# Patient Record
Sex: Male | Born: 1940
Health system: Southern US, Community
[De-identification: ages and names within clinical notes are randomized; demographics above are authoritative.]

## PROBLEM LIST (undated history)

## (undated) DIAGNOSIS — IMO0002 Reserved for concepts with insufficient information to code with codable children: Secondary | ICD-10-CM

## (undated) DIAGNOSIS — M1990 Unspecified osteoarthritis, unspecified site: Secondary | ICD-10-CM

## (undated) DIAGNOSIS — Z83719 Family history of colon polyps, unspecified: Secondary | ICD-10-CM

## (undated) DIAGNOSIS — E7800 Pure hypercholesterolemia, unspecified: Secondary | ICD-10-CM

## (undated) DIAGNOSIS — H43812 Vitreous degeneration, left eye: Secondary | ICD-10-CM

## (undated) DIAGNOSIS — M549 Dorsalgia, unspecified: Secondary | ICD-10-CM

## (undated) DIAGNOSIS — K219 Gastro-esophageal reflux disease without esophagitis: Secondary | ICD-10-CM

## (undated) DIAGNOSIS — I10 Essential (primary) hypertension: Secondary | ICD-10-CM

## (undated) DIAGNOSIS — Z8371 Family history of colonic polyps: Secondary | ICD-10-CM

## (undated) DIAGNOSIS — N400 Enlarged prostate without lower urinary tract symptoms: Secondary | ICD-10-CM

## (undated) DIAGNOSIS — E785 Hyperlipidemia, unspecified: Secondary | ICD-10-CM

## (undated) DIAGNOSIS — Z923 Personal history of irradiation: Secondary | ICD-10-CM

## (undated) DIAGNOSIS — N281 Cyst of kidney, acquired: Secondary | ICD-10-CM

## (undated) DIAGNOSIS — C099 Malignant neoplasm of tonsil, unspecified: Secondary | ICD-10-CM

## (undated) DIAGNOSIS — K649 Unspecified hemorrhoids: Secondary | ICD-10-CM

## (undated) DIAGNOSIS — IMO0001 Reserved for inherently not codable concepts without codable children: Secondary | ICD-10-CM

## (undated) HISTORY — DX: Gastro-esophageal reflux disease without esophagitis: K21.9

## (undated) HISTORY — DX: Hyperlipidemia, unspecified: E78.5

## (undated) HISTORY — DX: Family history of colon polyps, unspecified: Z83.719

## (undated) HISTORY — PX: CARDIAC CATHETERIZATION: SHX172

## (undated) HISTORY — DX: Vitreous degeneration, left eye: H43.812

## (undated) HISTORY — DX: Unspecified hemorrhoids: K64.9

## (undated) HISTORY — DX: Unspecified osteoarthritis, unspecified site: M19.90

## (undated) HISTORY — DX: Pure hypercholesterolemia, unspecified: E78.00

## (undated) HISTORY — DX: Family history of colonic polyps: Z83.71

## (undated) HISTORY — DX: Essential (primary) hypertension: I10

## (undated) HISTORY — DX: Benign prostatic hyperplasia without lower urinary tract symptoms: N40.0

## (undated) HISTORY — DX: Dorsalgia, unspecified: M54.9

## (undated) HISTORY — DX: Malignant neoplasm of tonsil, unspecified: C09.9

## (undated) HISTORY — DX: Cyst of kidney, acquired: N28.1

---

## 1973-03-11 HISTORY — PX: VASECTOMY: SHX75

## 1999-08-30 ENCOUNTER — Ambulatory Visit (HOSPITAL_COMMUNITY): Admission: AD | Admit: 1999-08-30 | Discharge: 1999-08-30 | Payer: Self-pay | Admitting: Interventional Cardiology

## 2000-01-21 ENCOUNTER — Ambulatory Visit (HOSPITAL_COMMUNITY): Admission: RE | Admit: 2000-01-21 | Discharge: 2000-01-21 | Payer: Self-pay | Admitting: Gastroenterology

## 2001-07-30 ENCOUNTER — Ambulatory Visit (HOSPITAL_COMMUNITY): Admission: RE | Admit: 2001-07-30 | Discharge: 2001-07-30 | Payer: Self-pay | Admitting: Gastroenterology

## 2006-02-11 ENCOUNTER — Encounter: Admission: RE | Admit: 2006-02-11 | Discharge: 2006-02-11 | Payer: Self-pay | Admitting: Internal Medicine

## 2011-12-04 DIAGNOSIS — H43399 Other vitreous opacities, unspecified eye: Secondary | ICD-10-CM | POA: Diagnosis not present

## 2011-12-04 DIAGNOSIS — H43819 Vitreous degeneration, unspecified eye: Secondary | ICD-10-CM | POA: Diagnosis not present

## 2011-12-04 DIAGNOSIS — H35379 Puckering of macula, unspecified eye: Secondary | ICD-10-CM | POA: Diagnosis not present

## 2011-12-04 DIAGNOSIS — H2510 Age-related nuclear cataract, unspecified eye: Secondary | ICD-10-CM | POA: Diagnosis not present

## 2011-12-04 DIAGNOSIS — H04129 Dry eye syndrome of unspecified lacrimal gland: Secondary | ICD-10-CM | POA: Diagnosis not present

## 2011-12-10 DIAGNOSIS — R109 Unspecified abdominal pain: Secondary | ICD-10-CM | POA: Diagnosis not present

## 2011-12-10 DIAGNOSIS — Z23 Encounter for immunization: Secondary | ICD-10-CM | POA: Diagnosis not present

## 2012-07-17 DIAGNOSIS — R0789 Other chest pain: Secondary | ICD-10-CM | POA: Diagnosis not present

## 2012-12-07 DIAGNOSIS — H43399 Other vitreous opacities, unspecified eye: Secondary | ICD-10-CM | POA: Diagnosis not present

## 2012-12-07 DIAGNOSIS — H538 Other visual disturbances: Secondary | ICD-10-CM | POA: Diagnosis not present

## 2012-12-07 DIAGNOSIS — H04129 Dry eye syndrome of unspecified lacrimal gland: Secondary | ICD-10-CM | POA: Diagnosis not present

## 2012-12-07 DIAGNOSIS — H31019 Macula scars of posterior pole (postinflammatory) (post-traumatic), unspecified eye: Secondary | ICD-10-CM | POA: Diagnosis not present

## 2012-12-07 DIAGNOSIS — H35379 Puckering of macula, unspecified eye: Secondary | ICD-10-CM | POA: Diagnosis not present

## 2012-12-07 DIAGNOSIS — H35369 Drusen (degenerative) of macula, unspecified eye: Secondary | ICD-10-CM | POA: Diagnosis not present

## 2012-12-07 DIAGNOSIS — H43819 Vitreous degeneration, unspecified eye: Secondary | ICD-10-CM | POA: Diagnosis not present

## 2012-12-07 DIAGNOSIS — H2510 Age-related nuclear cataract, unspecified eye: Secondary | ICD-10-CM | POA: Diagnosis not present

## 2014-02-21 DIAGNOSIS — R000 Tachycardia, unspecified: Secondary | ICD-10-CM | POA: Diagnosis not present

## 2014-02-21 DIAGNOSIS — K088 Other specified disorders of teeth and supporting structures: Secondary | ICD-10-CM | POA: Diagnosis not present

## 2014-02-21 DIAGNOSIS — R9431 Abnormal electrocardiogram [ECG] [EKG]: Secondary | ICD-10-CM | POA: Diagnosis not present

## 2014-02-21 DIAGNOSIS — R51 Headache: Secondary | ICD-10-CM | POA: Diagnosis not present

## 2014-03-01 DIAGNOSIS — R51 Headache: Secondary | ICD-10-CM | POA: Diagnosis not present

## 2014-03-01 DIAGNOSIS — R9431 Abnormal electrocardiogram [ECG] [EKG]: Secondary | ICD-10-CM | POA: Diagnosis not present

## 2014-03-01 DIAGNOSIS — R000 Tachycardia, unspecified: Secondary | ICD-10-CM | POA: Diagnosis not present

## 2014-03-01 DIAGNOSIS — R739 Hyperglycemia, unspecified: Secondary | ICD-10-CM | POA: Diagnosis not present

## 2014-03-01 DIAGNOSIS — K088 Other specified disorders of teeth and supporting structures: Secondary | ICD-10-CM | POA: Diagnosis not present

## 2014-04-22 ENCOUNTER — Encounter: Payer: Self-pay | Admitting: Interventional Cardiology

## 2014-04-22 ENCOUNTER — Ambulatory Visit (INDEPENDENT_AMBULATORY_CARE_PROVIDER_SITE_OTHER): Payer: Medicare Other | Admitting: Interventional Cardiology

## 2014-04-22 VITALS — BP 124/80 | HR 73 | Ht 72.0 in | Wt 226.8 lb

## 2014-04-22 DIAGNOSIS — I10 Essential (primary) hypertension: Secondary | ICD-10-CM | POA: Diagnosis not present

## 2014-04-22 DIAGNOSIS — E785 Hyperlipidemia, unspecified: Secondary | ICD-10-CM | POA: Diagnosis not present

## 2014-04-22 DIAGNOSIS — R9431 Abnormal electrocardiogram [ECG] [EKG]: Secondary | ICD-10-CM | POA: Diagnosis not present

## 2014-04-22 NOTE — Patient Instructions (Signed)
Follow up appointment will be determined based on results of stress test  Your physician has requested that you have an exercise stress myoview. For further information please visit [email or web address]. Please follow instruction sheet, as given.

## 2014-04-22 NOTE — Progress Notes (Signed)
Patient ID: Randy Hanson, male   DOB: 1940/11/21, 74 y.o.   MRN: [id number]    Cardiology Office Note   Date:  04/22/2014   ID:  Randy Hanson, DOB 10/30/1940, MRN [id number]  PCP:  [name], MD  Cardiologist:   Lesleigh Noe, MD   Chief Complaint  Patient presents with  . Tachycardia      History of Present Illness: Randy Hanson is a 74 y.o. male who presents for  Evaluation of an abnormal EKG. The patient states he has been doing well. Has no cardiopulmonary complaints. He saw Dr. Kevan Ny in January complaining of a headache and an EKG was performed. The EKG demonstrated inferior scooping ST segment depression in 2, 3, aVF, V5 and V6. Because of the EKG abnormalities patient is referred for consideration of stress testing to rule out underlying coronary disease.   of note is that the patient can walk up to 3-4 miles at his pain is without chest discomfort or excessive dyspnea. He does this several times per week. There is a prior history of exercise treadmill testing in 2001 which led to a myocardial perfusion study, which then led to coronary angiography. The coronary angiogram did not reveal any evidence of obstruction raising the possibility of false positive EKG and myocardial perfusion findings.   Patient denies tobacco, diabetes, and family history of premature coronary atherosclerosis.    Past Medical History  Diagnosis Date  . Hypertension   . Esophageal reflux   . Back pain   . Hypercholesteremia   . FHx: colonic polyps   . Hemorrhoids   . Hyperlipemia   . DJD (degenerative joint disease)   . BPH (benign prostatic hyperplasia)   . Posterior vitreous detachment, left eye     DR. KATHERINE HECKER  AUGUST 2011  . Kidney cysts     [facility] [place] [facility]    No past surgical history on file.   Current Outpatient Prescriptions  Medication Sig Dispense Refill  . aspirin 81 MG tablet Take 81 mg by mouth daily.    .  hydrochlorothiazide (HYDRODIURIL) 25 MG tablet Take 25 mg by mouth daily.    Marland Kitchen losartan (COZAAR) 50 MG tablet Take 50 mg by mouth daily.    . naproxen sodium (ANAPROX) 220 MG tablet Take 220 mg by mouth 2 (two) times daily with a meal.    . Omega-3 Fatty Acids (FISH OIL) 1000 MG CAPS Take 1 capsule by mouth daily.    . Pantothenic Acid 500 MG TABS Take 1 tablet by mouth daily.    . ranitidine (ZANTAC) 150 MG capsule Take 150 mg by mouth 2 (two) times daily.    Marland Kitchen terazosin (HYTRIN) 2 MG capsule Take 2 mg by mouth at bedtime.    . vitamin E 400 UNIT capsule Take 400 Units by mouth daily.     No current facility-administered medications for this visit.    Allergies:   Lisinopril    Social History:  The patient  reports that he has quit smoking. He does not have any smokeless tobacco history on file. He reports that he does not drink alcohol or use illicit drugs.   Family History:  The patient's family history is not on file.    ROS:  Please see the history of present illness.   Otherwise, review of systems are positive for  Arthritis in both knees. Occasional headache..   All other systems are reviewed and negative.  PHYSICAL EXAM: VS:  BP 124/80 mmHg  Pulse 73  Ht 6' (1.829 m)  Wt 226 lb 12.8 oz (102.876 kg)  BMI 30.75 kg/m2 , BMI Body mass index is 30.75 kg/(m^2). GEN: Well nourished, well developed, in no acute distress HEENT: normal Neck: no JVD, carotid bruits, or masses Cardiac: RRR; no murmurs, rubs, or gallops,no edema  Respiratory:  clear to auscultation bilaterally, normal work of breathing GI: soft, nontender, nondistended, + BS MS: no deformity or atrophy Skin: warm and dry, no rash Neuro:  Strength and sensation are intact Psych: euthymic mood, full affect   EKG:  EKG is ordered today. The ekg ordered today demonstrates  Normal sinus rhythm , first-degree AV block, ST segment depression/scooping ST segments lead II,  AVF, and V5 through V6. No prior tracing to  compare.   Recent Labs: No results found for requested labs within last 365 days.    Lipid Panel No results found for: CHOL, TRIG, HDL, CHOLHDL, VLDL, LDLCALC, LDLDIRECT    Wt Readings from Last 3 Encounters:  04/22/14 226 lb 12.8 oz (102.876 kg)      Other studies Reviewed: Additional studies/ records that were reviewed today include: \. Review of the above records demonstrates:    ASSESSMENT AND PLAN:  1.   Abnormal EKG with inferolateral scooping ST segments raising the possibility of ischemia at rest. Given the patient's overall condition in absence of symptoms, these likely represent chronic EKG changes, possibly related to electrolyte disturbance due to relatively high dose of thiazide diuretic. 2. Prior history of false positive EKG response to exercise resulting in coronary angiography which was normal, 2001  3. Hyperlipidemia not being treated .   Current medicines are reviewed at length with the patient today.  The patient does not have concerns regarding medicines.  The following changes have been made:  no change  Labs/ tests ordered today include:  Stress myocardial perfusion study   Orders Placed This Encounter  Procedures  . Myocardial Perfusion Imaging  . EKG 12-Lead     Disposition:   FU with  Katrinka Blazing as needed depending upon findings of the nuclear study.    Signed, Lesleigh Noe, MD  04/22/2014 2:09 PM    [facility] [address] [place], [place], [place]  [address] Phone: [phone]; Fax: [phone]

## 2014-05-04 ENCOUNTER — Encounter (HOSPITAL_COMMUNITY): Payer: Medicare Other

## 2014-05-13 ENCOUNTER — Ambulatory Visit (HOSPITAL_COMMUNITY): Payer: Medicare Other | Attending: Cardiology | Admitting: Radiology

## 2014-05-13 DIAGNOSIS — I10 Essential (primary) hypertension: Secondary | ICD-10-CM | POA: Diagnosis not present

## 2014-05-13 DIAGNOSIS — R9431 Abnormal electrocardiogram [ECG] [EKG]: Secondary | ICD-10-CM | POA: Insufficient documentation

## 2014-05-13 MED ORDER — TECHNETIUM TC 99M SESTAMIBI GENERIC - CARDIOLITE
11.0000 | Freq: Once | INTRAVENOUS | Status: AC | PRN
  Administered 2014-05-13: 11 via INTRAVENOUS

## 2014-05-13 MED ORDER — TECHNETIUM TC 99M SESTAMIBI GENERIC - CARDIOLITE
33.0000 | Freq: Once | INTRAVENOUS | Status: AC | PRN
  Administered 2014-05-13: 33 via INTRAVENOUS

## 2014-05-13 NOTE — Progress Notes (Signed)
Randy Hanson, Randy [id number] [phone]    Cardiology Nuclear Med Study  OTIE KALLENBACH is a 74 y.o. male     MRN : [id number]     DOB: [birth year]  Procedure Date: 05/13/2014  Nuclear Med Background Indication for Stress Test:  Evaluation for Hanson and Abnormal EKG History:  MPI 2001 (normal) Cardiac Risk Factors: Hypertension  Symptoms:  None indicated   Nuclear Pre-Procedure Caffeine/Decaff Intake:  None> 12 hrs NPO After: 6:30pm   Lungs:  clear O2 Sat: 96% on room air. IV 0.9% NS with Angio Cath:  22g  IV Site: R Hand x 1, tolerated well IV Started by:  Irean Hong, RN  Chest Size (in):  46 Cup Size: n/a  Height: 6' (1.829 m)  Weight:  222 lb (100.699 kg)  BMI:  Body mass index is 30.1 kg/(m^2). Tech Comments:  Patient took Hctz and Losartan this am. Irean Hong, RN.    Nuclear Med Study 1 or 2 day study: 1 day  Stress Test Type:  Stress  Reading MD: N/A  Order Authorizing Provider:  Verdis Prime, III, MD  Resting Radionuclide: Technetium 19m Sestamibi  Resting Radionuclide Dose: 11.0 mCi   Stress Radionuclide:  Technetium 27m Sestamibi  Stress Radionuclide Dose: 33.0 mCi           Stress Protocol Rest HR: 68 Stress HR: 136  Rest BP: 134/80 Stress BP: 159/77  Exercise Time (min): 7:00 METS: 8.5   Predicted Max HR: 149 bpm % Max HR: 91.28 bpm Rate Pressure Product: 66063   Dose of Adenosine (mg):  n/a Dose of Lexiscan: n/a mg  Dose of Atropine (mg): n/a Dose of Dobutamine: n/a mcg/kg/min (at max HR)  Stress Test Technologist: Nelson Chimes, BS-ES  Nuclear Technologist:  Kerby Nora, CNMT     Rest Procedure:  Myocardial perfusion imaging was performed at rest 45 minutes following the intravenous administration of Technetium 78m Sestamibi. Rest ECG: NSR - Normal EKG  Stress Procedure:  The patient exercised on the treadmill utilizing the Bruce Protocol for 7:00 minutes. The patient stopped due to  fatigue and denied any chest pain.  Technetium 38m Sestamibi was injected at peak exercise and myocardial perfusion imaging was performed after a brief delay. Stress ECG: 2 mm downward sloping ST depression with exercise.  QPS Raw Data Images:  Normal; no motion artifact; normal heart/lung ratio. Stress Images:  Normal homogeneous uptake in all areas of the myocardium. Rest Images:  Normal homogeneous uptake in all areas of the myocardium. Subtraction (SDS):  No evidence of Hanson. Transient Ischemic Dilatation (Normal <1.22):  0.91 Lung/Heart Ratio (Normal <0.45):  0.33  Quantitative Gated Spect Images QGS EDV:  103 ml QGS ESV:  36 ml  Impression Exercise Capacity:  Good exercise capacity. BP Response:  Normal blood pressure response. Clinical Symptoms:  No chest pain. ECG Impression:  2 mm downward sloping ST depression with exercise. Comparison with Prior Nuclear Study: No images to compare  Overall Impression:  Low risk stress nuclear study. With exercise the patient develops downward sloping ST depression in the inferolateral leads. These changes are not associated with any chest discomfort. Perfusion imaging shows no evidence of myocardial Hanson or scar. LV function is normal with no segmental wall motion abnormalities. It is possible that the EKG changes reflect microvascular disease related to his history of hypertension.  LV Ejection Fraction: 65%.  LV Wall Motion:  Normal Wall Motion  Cassell Clement  MD

## 2014-05-18 ENCOUNTER — Telehealth: Payer: Self-pay

## 2014-05-19 NOTE — Telephone Encounter (Signed)
called to give pt myoview results.lmtcb

## 2014-05-19 NOTE — Telephone Encounter (Signed)
-----   Message from Belva Crome, MD sent at 05/16/2014  7:45 AM EST ----- Low risk study. Clinical observation recommended

## 2014-05-20 NOTE — Telephone Encounter (Signed)
Spoke with patient about myoview results.

## 2014-05-20 NOTE — Telephone Encounter (Signed)
F/U      Pt returning call from yesterday.    Please call.

## 2014-06-23 DIAGNOSIS — M179 Osteoarthritis of knee, unspecified: Secondary | ICD-10-CM | POA: Diagnosis not present

## 2014-06-23 DIAGNOSIS — Z0001 Encounter for general adult medical examination with abnormal findings: Secondary | ICD-10-CM | POA: Diagnosis not present

## 2014-06-23 DIAGNOSIS — I10 Essential (primary) hypertension: Secondary | ICD-10-CM | POA: Diagnosis not present

## 2014-06-23 DIAGNOSIS — R9431 Abnormal electrocardiogram [ECG] [EKG]: Secondary | ICD-10-CM | POA: Diagnosis not present

## 2014-06-23 DIAGNOSIS — M549 Dorsalgia, unspecified: Secondary | ICD-10-CM | POA: Diagnosis not present

## 2014-06-23 DIAGNOSIS — G609 Hereditary and idiopathic neuropathy, unspecified: Secondary | ICD-10-CM | POA: Diagnosis not present

## 2014-06-23 DIAGNOSIS — K219 Gastro-esophageal reflux disease without esophagitis: Secondary | ICD-10-CM | POA: Diagnosis not present

## 2014-06-23 DIAGNOSIS — R739 Hyperglycemia, unspecified: Secondary | ICD-10-CM | POA: Diagnosis not present

## 2014-06-23 DIAGNOSIS — E780 Pure hypercholesterolemia: Secondary | ICD-10-CM | POA: Diagnosis not present

## 2014-08-09 DIAGNOSIS — K611 Rectal abscess: Secondary | ICD-10-CM | POA: Diagnosis not present

## 2014-11-20 DIAGNOSIS — T2069XA Corrosion of second degree of multiple sites of head, face, and neck, initial encounter: Secondary | ICD-10-CM | POA: Diagnosis not present

## 2014-11-20 DIAGNOSIS — T22612A Corrosion of second degree of left forearm, initial encounter: Secondary | ICD-10-CM | POA: Diagnosis not present

## 2014-11-20 DIAGNOSIS — T22611A Corrosion of second degree of right forearm, initial encounter: Secondary | ICD-10-CM | POA: Diagnosis not present

## 2014-11-20 DIAGNOSIS — Y92008 Other place in unspecified non-institutional (private) residence as the place of occurrence of the external cause: Secondary | ICD-10-CM | POA: Diagnosis not present

## 2014-11-20 DIAGNOSIS — T20612A Corrosion of second degree of left ear [any part, except ear drum], initial encounter: Secondary | ICD-10-CM | POA: Diagnosis not present

## 2014-11-22 DIAGNOSIS — T304 Corrosion of unspecified body region, unspecified degree: Secondary | ICD-10-CM | POA: Diagnosis not present

## 2015-07-06 ENCOUNTER — Encounter: Payer: Self-pay | Admitting: Radiation Oncology

## 2015-07-10 ENCOUNTER — Other Ambulatory Visit: Payer: Self-pay | Admitting: Hematology & Oncology

## 2015-07-10 DIAGNOSIS — R590 Localized enlarged lymph nodes: Secondary | ICD-10-CM

## 2015-07-12 ENCOUNTER — Encounter: Payer: Self-pay | Admitting: Hematology & Oncology

## 2015-07-12 ENCOUNTER — Ambulatory Visit: Payer: Medicare Other

## 2015-07-12 ENCOUNTER — Other Ambulatory Visit (HOSPITAL_BASED_OUTPATIENT_CLINIC_OR_DEPARTMENT_OTHER): Payer: Medicare Other

## 2015-07-12 ENCOUNTER — Ambulatory Visit (HOSPITAL_BASED_OUTPATIENT_CLINIC_OR_DEPARTMENT_OTHER): Payer: Medicare Other | Admitting: Hematology & Oncology

## 2015-07-12 VITALS — BP 128/73 | HR 61 | Temp 97.5°F | Resp 16 | Ht 72.0 in | Wt 235.0 lb

## 2015-07-12 DIAGNOSIS — C099 Malignant neoplasm of tonsil, unspecified: Secondary | ICD-10-CM | POA: Diagnosis not present

## 2015-07-12 DIAGNOSIS — R590 Localized enlarged lymph nodes: Secondary | ICD-10-CM

## 2015-07-12 DIAGNOSIS — R599 Enlarged lymph nodes, unspecified: Secondary | ICD-10-CM | POA: Diagnosis not present

## 2015-07-12 HISTORY — DX: Malignant neoplasm of tonsil, unspecified: C09.9

## 2015-07-12 LAB — COMPREHENSIVE METABOLIC PANEL
ALT: 16 U/L (ref 0–55)
AST: 20 U/L (ref 5–34)
Albumin: 3.9 g/dL (ref 3.5–5.0)
Alkaline Phosphatase: 61 U/L (ref 40–150)
Anion Gap: 8 mEq/L (ref 3–11)
BILIRUBIN TOTAL: 0.47 mg/dL (ref 0.20–1.20)
BUN: 19.8 mg/dL (ref 7.0–26.0)
CHLORIDE: 104 meq/L (ref 98–109)
CO2: 28 meq/L (ref 22–29)
CREATININE: 1.1 mg/dL (ref 0.7–1.3)
Calcium: 10 mg/dL (ref 8.4–10.4)
EGFR: 67 mL/min/{1.73_m2} — ABNORMAL LOW (ref >90)
GLUCOSE: 98 mg/dL (ref 70–140)
Potassium: 4.1 mEq/L (ref 3.5–5.1)
SODIUM: 139 meq/L (ref 136–145)
TOTAL PROTEIN: 7.2 g/dL (ref 6.4–8.3)

## 2015-07-12 LAB — CBC WITH DIFFERENTIAL (CANCER CENTER ONLY)
BASO#: 0 10*3/uL (ref 0.0–0.2)
BASO%: 0.5 % (ref 0.0–2.0)
EOS%: 4.8 % (ref 0.0–7.0)
Eosinophils Absolute: 0.3 10*3/uL (ref 0.0–0.5)
HCT: 42.2 % (ref 38.7–49.9)
HEMOGLOBIN: 15.1 g/dL (ref 13.0–17.1)
LYMPH#: 1.6 10*3/uL (ref 0.9–3.3)
LYMPH%: 26.5 % (ref 14.0–48.0)
MCH: 31.9 pg (ref 28.0–33.4)
MCHC: 35.8 g/dL (ref 32.0–35.9)
MCV: 89 fL (ref 82–98)
MONO#: 0.7 10*3/uL (ref 0.1–0.9)
MONO%: 12.6 % (ref 0.0–13.0)
NEUT#: 3.3 10*3/uL (ref 1.5–6.5)
NEUT%: 55.6 % (ref 40.0–80.0)
Platelets: 214 10*3/uL (ref 145–400)
RBC: 4.73 10*6/uL (ref 4.20–5.70)
RDW: 12.7 % (ref 11.1–15.7)
WBC: 5.9 10*3/uL (ref 4.0–10.0)

## 2015-07-12 LAB — LACTATE DEHYDROGENASE: LDH: 164 U/L (ref 125–245)

## 2015-07-12 NOTE — Progress Notes (Signed)
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 Other    New patient  : I have throat cancer.  HPI: [name] is a very nice 75 year old white male. He looks in great shape. He's currently does not look his age. He is still working at his [employer].  He served in the [employer]. He is in for 6 years. He talked all about this. He was incredibly fascinating as to what he did.  He began to note some fullness on the right side of his neck. He does see an internist at the [facility]. He was seen area and he ultimately had a CT scan done. The CT scan I foresee showed multiple enlarged lymph nodes on the right side. I think the largest measured 1.4 cm. There is also some enlargement of the right tonsil. There is no left cervical lymphadenopathy. There is no evidence of any disease in his chest.  He was then seen at [facility] by ENT. He was seen by Dr. Nicolette Bang. He did a very thorough workup. He did endoscopic evaluation area and he did a married exam. He felt that the right tonsil was enlarged and firm.  He then looked at the nasopharynx and hypopharynx. Everything looked okay. Again the right tonsil looked abnormal.  A biopsy was done in the right neck. The pathology report [id number]) showed squamous cell carcinoma. Further testing was done which show that the tumor was HPV16 POSITIVE.  He is to have a PET scan at [facility].  His daughter-in-law is a patient mine. She really wanted [place] to see him so we can try to help decide as to what type of therapy was indicated.  He's had no hoarseness. He is had no weight loss. He's had no mouth pain. He's had no ear pain. He's had no change in bowel or bladder habits.  He has not smoked for about 35 years. He really does not drink.  Overall, his performance status is ECOG 0.     Past Medical History  Diagnosis Date  . Hypertension    . Esophageal reflux   . Back pain   . Hypercholesteremia   . FHx: colonic polyps   . Hemorrhoids   . Hyperlipemia   . DJD (degenerative joint disease)   . BPH (benign prostatic hyperplasia)   . Posterior vitreous detachment, left eye     DR. KATHERINE HECKER  AUGUST 2011  . Kidney cysts     [facility] [place] [facility]  :  No past surgical history on file.:   Current outpatient prescriptions:  .  aspirin 81 MG tablet, Take 81 mg by mouth daily., Disp: , Rfl:  .  hydrochlorothiazide (HYDRODIURIL) 25 MG tablet, Take 25 mg by mouth daily., Disp: , Rfl:  .  losartan (COZAAR) 50 MG tablet, Take 50 mg by mouth daily., Disp: , Rfl:  .  naproxen sodium (ANAPROX) 220 MG tablet, Take 220 mg by mouth 2 (two) times daily with a meal., Disp: , Rfl:  .  Omega-3 Fatty Acids (FISH OIL) 1000 MG CAPS, Take 1 capsule by mouth daily., Disp: , Rfl:  .  Pantothenic Acid 500 MG TABS, Take 1 tablet by mouth daily., Disp: , Rfl:  .  ranitidine (ZANTAC) 150 MG capsule, Take 150 mg by mouth 2 (two) times daily., Disp: , Rfl:  .  terazosin (  HYTRIN) 2 MG capsule, Take 2 mg by mouth at bedtime., Disp: , Rfl:  .  vitamin E 400 UNIT capsule, Take 400 Units by mouth daily., Disp: , Rfl: :  :  Allergies  Allergen Reactions  . Lisinopril Cough  :  Family History  Problem Relation Age of Onset  . Hypertension Mother   :  Social History   Social History  . Marital Status: Married    Spouse Name: N/A  . Number of Children: N/A  . Years of Education: N/A   Occupational History  . Not on file.   Social History Main Topics  . Smoking status: Former Research scientist (life sciences)  . Smokeless tobacco: Not on file     Comment: quit 35 years ago  . Alcohol Use: No  . Drug Use: No  . Sexual Activity: Not on file   Other Topics Concern  . Not on file   Social History Narrative   TOBACCO USE CIGARETTES: NEVER SMOKED.NO SMOKING.NO ALCOHOL .CAFFEINE YES:NO  RECREATIONAL DRUGS. OCCUPATION :RETIRED   MARTIAL  STATUS : MARRIED   :  Pertinent items are noted in HPI.  Exam: @IPVITALS @ Well-developed and well-nourished white male in no obvious distress. Vital signs show a temperature of 97.5. Pulse 61. Blood pressure 128/73. Weight is 235 pounds. Head neck exam shows no ocular or oral lesions. He does have some slight fullness in the right neck. There is some slight fullness of the right tonsil. No distinct adenopathy is noted in the neck area and thyroid is nonpalpable. Lungs are clear bilaterally. Cardiac exam regular rate and rhythm with no murmurs, rubs or bruits. Abdomen is soft. He has good bowel sounds. There is no fluid wave. There is no palpable liver or spleen tip. Back exam shows no tenderness over the spine, ribs or hips. Extremities shows no clubbing, cyanosis or edema. Neurological exam shows no focal neurological deficits. Skin exam shows no rashes, ecchymoses or petechia.    Recent Labs  07/12/15 1144  WBC 5.9  HGB 15.1  HCT 42.2  PLT 214    Recent Labs  07/12/15 1145  NA 139  K 4.1  CO2 28  GLUCOSE 98  BUN 19.8  CREATININE 1.1  CALCIUM 10.0    Blood smear review:  None  Pathology: See above     Assessment and Plan:  [name] is a very nice 75 year old white male. It looks like he has locally advanced squamous of carcinoma the right tonsil. However, his tumor is HPV positive. This definitely improves his outlook. By the staging criteria for HPV related oropharyngeal cancer, his stages stage I despite the lymphadenopathy.  I believe that he can be cured. I would definitely favor radiation with chemotherapy. I think single leg chemotherapy with cis-platinum would definitely be the best way to go. I don't think we need to add Erbitux.  He is to have his PET scan next week. I will have to be otherwise 4.  I think he sees Dr. Lanell Persons from radiation oncology next week.  He will need a Port-A-Cath if we are going to do combined modality therapy.  I would like to hope  that he will not need a feeding tube. I told him to eat and drink as much as he can right now so that he will gain weight before he loses weight. I told him that he will probably lose about 40 - 50 pounds. It would not surprise me if he did.  Again, I really believe that he can  be cured.   Even though he has early-stage disease via the new staging criteria for HPV related latency, the treatment approach is still the same.  We will have to await Dr. Lanell Persons input. I suspect that we probably will not get started with treatment for another 2- 3 weeks. Hopefully, he will not need any kind of dental work to be done.

## 2015-07-13 LAB — PREALBUMIN: PREALBUMIN: 26 mg/dL (ref 9–32)

## 2015-07-14 ENCOUNTER — Telehealth: Payer: Self-pay | Admitting: *Deleted

## 2015-07-14 NOTE — Telephone Encounter (Signed)
  Oncology Nurse Navigator Documentation  Navigator Location: [facility] (07/14/15 1629) Navigator Encounter Type: Introductory phone call (07/14/15 1629) Telephone: Randy Hanson Confirmation/Clarification (07/14/15 1629)     Placed introductory call to new referral patient [name].  I spoke with his wife as he was not available.  Introduced myself as the oncology nurse navigator that works with Dr. Isidore Moos to whom he has been referred by the [place].  She confirmed understanding of referral and appt date/time of 07/19/15 0830 NE/0900 Dr. Isidore Moos.  I briefly explained my role as the H&N Navigator, indicated that I would be joining them during his appt next week.  I confirmed understanding of the [facility] location, explained arrival and RadOnc registration process for appt.  I provided my contact information, encouraged them to call with questions/concerns before next week.  She verbalized understanding of information provided, expressed appreciation for my call.  Randy [place], Randy Hanson, BSN, [place] [facility] [place] [phone]                                          Time Spent with Patient: 15 (07/14/15 1629)

## 2015-07-14 NOTE — Progress Notes (Signed)
Radiation Oncology         [phone] ________________________________  Initial outpatient Consultation  Name: Randy Hanson MRN: [id number]  Date: 07/19/2015  DOB: 08-Jul-1940  JY:NWGNF,AOZHYQ NEVILL, MD  [name], MD   REFERRING PHYSICIAN: [name], MD  DIAGNOSIS:    ICD-9-CM ICD-10-CM   1. Carcinoma of tonsillar fossa (HCC) 146.1 C09.0 TSH     Ambulatory referral to Social Work     Ambulatory referral to Physical Therapy     Amb Referral to Nutrition and Diabetic E     Referral to Neuro Rehab  2. Weight gain 783.1 R63.5 TSH     T1N2bM0 STAGE IVA (PET RESULTS PENDING) Right Tonsil squamous cell carcinoma, HPV+  HISTORY OF PRESENT ILLNESS::Randy Hanson is a 75 y.o. male who presented with a submandibular right neck mass 2 years ago. Right submandibular region. Then, another mass arose inferior to this 4-5 months ago.   Subsequently, the patient saw Dr. Hezzie Bump who appreciated an abnormal right tonsil. Biopsy (06-14-15) of a right neck node (FNA) showed carcinoma, favor squamous;  HPV testing showed HPV 16 positivity.  Pertinent imaging thus far includes US neck and CT neck in January. These revealed - [place]: 1.7x1.2 ovoid hypoechoic structure.  CT: motion artifact but some asymmetric enlargement of right palatine tonsil.  Enlarged level 1-4 nodes in right neck.   PET scan on 07-18-15 was performed.  I do not yet have the report.  To my eye, I don't see other areas of disease beyond the neck and tonsil.    Swallowing issues, if any: no  Weight Changes: gaining  Tobacco history, if any: quit 35 years ago, cigarettes  ETOH abuse, if any: quit 4-5 yrs ago   PREVIOUS RADIATION THERAPY: No  PAST MEDICAL HISTORY:  has a past medical history of Hypertension; Esophageal reflux; Back pain; Hypercholesteremia; FHx: colonic polyps; Hemorrhoids; Hyperlipemia; DJD (degenerative joint disease); BPH (benign prostatic hyperplasia); Posterior vitreous detachment, left eye; Kidney  cysts; and Cancer of tonsil, palatine (HCC) (07/12/2015).    PAST SURGICAL HISTORY: Past Surgical History  Procedure Laterality Date  . Vasectomy  1975  . Cardiac catheterization      remote >15 years ago    FAMILY HISTORY: family history includes Hypertension in his mother.  SOCIAL HISTORY:  reports that he has quit smoking. He does not have any smokeless tobacco history on file. He reports that he does not drink alcohol or use illicit drugs.  ALLERGIES: Lisinopril  MEDICATIONS:  Current Outpatient Prescriptions  Medication Sig Dispense Refill  . aspirin 81 MG tablet Take 81 mg by mouth daily.    Marland Kitchen b complex vitamins tablet Take 1 tablet by mouth daily.    . hydrochlorothiazide (HYDRODIURIL) 25 MG tablet Take 25 mg by mouth daily.    Marland Kitchen losartan (COZAAR) 50 MG tablet Take 50 mg by mouth daily.    . naproxen sodium (ANAPROX) 220 MG tablet Take 220 mg by mouth 2 (two) times daily with a meal.    . Omega-3 Fatty Acids (FISH OIL) 1000 MG CAPS Take 1 capsule by mouth daily.    . Pantothenic Acid 500 MG TABS Take 1 tablet by mouth daily.    . ranitidine (ZANTAC) 150 MG capsule Take 150 mg by mouth 2 (two) times daily.    Marland Kitchen terazosin (HYTRIN) 2 MG capsule Take 2 mg by mouth at bedtime.    . vitamin E 400 UNIT capsule Take 400 Units by mouth daily. Reported on 07/19/2015  No current facility-administered medications for this encounter.    REVIEW OF SYSTEMS:  Notable for that above.   PHYSICAL EXAM:  height is 6' (1.829 m) and weight is 239 lb 6.4 oz (108.591 kg). His temperature is 98.1 F (36.7 C). His blood pressure is 131/74 and his pulse is 77. His oxygen saturation is 94%.   General: Alert and oriented, in no acute distress HEENT: Head is normocephalic. Extraocular movements are intact. Oropharynx is notable for no obvious no. No thrush.  Poor dentition. Neck: Neck is notable for nonbulky but enlarged palpable adenopathy in right Ib and IV and thickening on right levels II-III.     Heart: Regular in rate and rhythm   Chest: Clear to auscultation bilaterally, with no rhonchi, wheezes, or rales. Abdomen: Soft, nontender, nondistended, with no rigidity or guarding. Extremities: No cyanosis or edema. Lymphatics: see Neck Exam Skin: No concerning lesions. Musculoskeletal: symmetric strength and muscle tone throughout. Neurologic: Cranial nerves II through XII are grossly intact. No obvious focalities. Speech is fluent. Coordination is intact. Psychiatric: Judgment and insight are intact. Affect is appropriate.   ECOG = 0  0 - Asymptomatic (Fully active, able to carry on all predisease activities without restriction)  1 - Symptomatic but completely ambulatory (Restricted in physically strenuous activity but ambulatory and able to carry out work of a light or sedentary nature. For example, light housework, office work)  2 - Symptomatic, <50% in bed during the day (Ambulatory and capable of all self care but unable to carry out any work activities. Up and about more than 50% of waking hours)  3 - Symptomatic, >50% in bed, but not bedbound (Capable of only limited self-care, confined to bed or chair 50% or more of waking hours)  4 - Bedbound (Completely disabled. Cannot carry on any self-care. Totally confined to bed or chair)  5 - Death   Santiago Glad MM, Creech RH, Tormey DC, et al. [phone]). "Toxicity and response criteria of the [facility]". Am. Evlyn Clines. Oncol. 5 (6): 649-55   LABORATORY DATA:  Lab Results  Component Value Date   WBC 5.9 07/12/2015   HGB 15.1 07/12/2015   HCT 42.2 07/12/2015   MCV 89 07/12/2015   PLT 214 07/12/2015   CMP     Component Value Date/Time   NA 139 07/12/2015 1145   K 4.1 07/12/2015 1145   CO2 28 07/12/2015 1145   GLUCOSE 98 07/12/2015 1145   BUN 19.8 07/12/2015 1145   CREATININE 1.1 07/12/2015 1145   CALCIUM 10.0 07/12/2015 1145   PROT 7.2 07/12/2015 1145   ALBUMIN 3.9 07/12/2015 1145   AST 20 07/12/2015  1145   ALT 16 07/12/2015 1145   ALKPHOS 61 07/12/2015 1145   BILITOT 0.47 07/12/2015 1145      ,No results found for: TSH    RADIOGRAPHY: No results found.    IMPRESSION/PLAN:  This is a delightful patient with head and neck cancer. I do recommend radiotherapy for this patient.  We discussed the potential risks, benefits, and side effects of radiotherapy. We talked in detail about acute and late effects. We discussed that some of the most bothersome acute effects may be mucositis, dysgeusia, salivary changes, skin irritation, hair loss, dehydration, weight loss and fatigue. We talked about late effects which include but are not necessarily limited to dysphagia, hypothyroidism, nerve injury, spinal cord injury, xerostomia, trismus, and neck edema. No guarantees of treatment were given. A consent form was signed and placed in the  patient's medical record. The patient is enthusiastic about proceeding with treatment. I look forward to participating in the patient's care.    Simulation (treatment planning) will take place after clearance by dentistry  We also discussed that the treatment of head and neck cancer is a multidisciplinary process to maximize treatment outcomes and quality of life. For this reason the following referrals have been or will be made:    Medical oncology to discuss chemotherapy - He has discussed concurrent CDDP with Dr Myna Hidalgo.    Dentistry for dental evaluation, possible extractions in the radiation fields, and /or advice on reducing risk of cavities, osteoradionecrosis, or other oral issues.- Pt sees dentist in [place] system on 5-17.  I made a dose estimation sheet/drawing for the dentist and gave it to the patient to deliver.  I anticipate he'll at least need molar extractions.   Nutritionist for nutrition support during and after treatment.   Speech language pathology for swallowing and/or speech therapy.   Social work for social support.    Physical therapy due  to risk of lymphedema in neck and deconditioning.   Baseline labs including TSH.  Patient advised to continuing abstaining from tobacco and ETOH for life.  I do recommended a feeding tube as he will be at high risk for malnutrition and severe mucositis.  I'll ask Dr. Myna Hidalgo to coordinate a placement of the PEG tube by IR with the port a cath procedure.   Young Berry, RN, our Head and Neck Oncology Navigator is working on getting PET report ASAP.  Still not filed in Care Everywhere. __________________________________________   Lonie Peak, MD

## 2015-07-18 ENCOUNTER — Telehealth: Payer: Self-pay | Admitting: *Deleted

## 2015-07-18 ENCOUNTER — Encounter: Payer: Self-pay | Admitting: Radiation Oncology

## 2015-07-18 NOTE — Progress Notes (Signed)
Head and Neck Cancer Location of Tumor / Histology:  Right Neck- FNA favors Squamous Cell Carcinoma.  Patient presented with symptoms of: Dr. Servando Salina note from 06/14/15 states that Mr. Lovinger first noted a lump in his neck about 2 years ago. This has not changed since then. Located in right submandibular area. No pain  He developed a lower right node about 6 weeks ago. Some neck stiffness and feels swollen, no pain but somewhat tender.    Biopsies revealed:  06/14/15 CYTOLOGY REPORT  Final Cytologic Interpretation Right Neck, Fine Needle Aspiration II (smears and cell block): Carcinoma, favor squamous  Dr. Antonieta Pert note from 07/12/15 A biopsy was done in the right neck. The pathology report [id number]) showed squamous cell carcinoma. Further testing was done which show that the tumor was HPV16 POSITIVE.    Nutrition Status Yes No Comments  Weight changes? []  [x]  He has been gaining weight per Dr. Antonieta Pert recommendations.   Swallowing concerns? []  [x]    PEG? []  [x]     Referrals Yes No Comments  Social Work? []  [x]    Dentistry? []  [x]  He has an appointment with dentistry at the [facility] 07/26/15, He does have a partial, but doesn't wear it because it is broken  Swallowing therapy? []  [x]    Nutrition? []  [x]    Med/Onc? [x]  []  Dr. Marin Olp   Safety Issues Yes No Comments  Prior radiation? []  [x]    Pacemaker/ICD? []  [x]    Possible current pregnancy? []  [x]    Is the patient on methotrexate? []  [x]     Tobacco/Marijuana/Snuff/ETOH use: Former smoker of Cigarettes. He reports it was just socially, maybe 2 packs a week, He hasn't smoked in 35 years.  Past/Anticipated interventions by otolaryngology, if any:  He saw Dr. Nicolette Bang 06/14/15  Past/Anticipated interventions by medical oncology, if any:  07/12/15 Dr Marin Olp: I believe that he can be cured. I would definitely favor radiation with chemotherapy. I think single leg chemotherapy with cis-platinum would definitely be the best way to go. I  don't think we need to add Erbitux.      Current Complaints / other details:    ? PET 07/18/15 at [facility] The patient brought a disk with him with the scan.   BP 131/74 mmHg  Pulse 77  Temp(Src) 98.1 F (36.7 C)  Ht 6' (1.829 m)  Wt 239 lb 6.4 oz (108.591 kg)  BMI 32.46 kg/m2  SpO2 94%   Wt Readings from Last 3 Encounters:  07/19/15 239 lb 6.4 oz (108.591 kg)  07/12/15 235 lb (106.595 kg)  05/13/14 222 lb (100.699 kg)   No chief complaint on file.

## 2015-07-18 NOTE — Telephone Encounter (Signed)
  Oncology Nurse Navigator Documentation  Navigator Location: [facility] (07/18/15 1456) Navigator Encounter Type: Telephone (07/18/15 1456) Telephone: [place] Call (07/18/15 1456)                 Interventions: Coordination of Care (07/18/15 1456)      Spoke with Marlowe Kays at [facility] who indicated Mr. Alberding PET was scheduled for today at 1:00.  Spoke with Butch Penny at [facility], she indicated he had just finished PET, was enroute to another department to have scan copied to disc.  I reached patient's wife by cell, she confirmed he is bringing disc to tomorrow's appt.  Gayleen Orem, RN, BSN, [place] at [place] [phone]                      Time Spent with Patient: 30 (07/18/15 1456)

## 2015-07-18 NOTE — Telephone Encounter (Signed)
Called patient and left message to see if he had a PET scan @ [facility] on 07/17/15. Waiting for call back from patient.

## 2015-07-19 ENCOUNTER — Ambulatory Visit
Admission: RE | Admit: 2015-07-19 | Discharge: 2015-07-19 | Disposition: A | Discharge status: Home / Self Care | Payer: Non-veteran care | Source: Ambulatory Visit | Attending: Radiation Oncology | Admitting: Radiation Oncology

## 2015-07-19 ENCOUNTER — Encounter: Payer: Self-pay | Admitting: *Deleted

## 2015-07-19 ENCOUNTER — Encounter: Payer: Self-pay | Admitting: Radiation Oncology

## 2015-07-19 VITALS — BP 131/74 | HR 77 | Temp 98.1°F | Ht 72.0 in | Wt 239.4 lb

## 2015-07-19 DIAGNOSIS — R635 Abnormal weight gain: Secondary | ICD-10-CM

## 2015-07-19 DIAGNOSIS — Z510 Encounter for antineoplastic radiation therapy: Secondary | ICD-10-CM | POA: Insufficient documentation

## 2015-07-19 DIAGNOSIS — C090 Malignant neoplasm of tonsillar fossa: Secondary | ICD-10-CM

## 2015-07-19 DIAGNOSIS — K219 Gastro-esophageal reflux disease without esophagitis: Secondary | ICD-10-CM | POA: Insufficient documentation

## 2015-07-19 DIAGNOSIS — I10 Essential (primary) hypertension: Secondary | ICD-10-CM | POA: Diagnosis not present

## 2015-07-19 DIAGNOSIS — E7800 Pure hypercholesterolemia, unspecified: Secondary | ICD-10-CM | POA: Diagnosis not present

## 2015-07-19 DIAGNOSIS — E785 Hyperlipidemia, unspecified: Secondary | ICD-10-CM | POA: Insufficient documentation

## 2015-07-20 ENCOUNTER — Institutional Professional Consult (permissible substitution): Payer: No Typology Code available for payment source | Admitting: Radiation Oncology

## 2015-07-20 ENCOUNTER — Other Ambulatory Visit: Payer: Self-pay

## 2015-07-20 DIAGNOSIS — C090 Malignant neoplasm of tonsillar fossa: Secondary | ICD-10-CM

## 2015-07-21 ENCOUNTER — Other Ambulatory Visit: Payer: Self-pay | Admitting: Radiation Oncology

## 2015-07-21 ENCOUNTER — Inpatient Hospital Stay
Admission: RE | Admit: 2015-07-21 | Discharge: 2015-07-21 | Disposition: A | Discharge status: Home / Self Care | Payer: Self-pay | Source: Ambulatory Visit | Attending: Radiation Oncology | Admitting: Radiation Oncology

## 2015-07-21 ENCOUNTER — Telehealth: Payer: Self-pay | Admitting: *Deleted

## 2015-07-21 DIAGNOSIS — C099 Malignant neoplasm of tonsil, unspecified: Secondary | ICD-10-CM

## 2015-07-21 NOTE — Progress Notes (Signed)
  Oncology Nurse Navigator Documentation  Navigator Location: [facility] (07/19/15 1146) Navigator Encounter Type: Initial RadOnc (07/19/15 0905)           Patient Visit Type: RadOnc (07/19/15 0905) Treatment Phase: Pre-Tx/Tx Discussion (07/19/15 0905) Barriers/Navigation Needs: Education (07/19/15 0905) Education: Newly Diagnosed Cancer Education (07/19/15 4314)              Acuity: Level 2 (07/19/15 0905)     Met with Randy Hanson during New Patient Consult with Dr. Isidore Moos.  He was accompanied by his wife.  1. Further introduced myself as his Navigator, explained my role as a member of the Care Team.   2. Provided New Patient Information packet, discussed contents:  Contact information for physician(s), myself, other members of the Care Team.  Advance Directive information ([place] blue pamphlet with LCSW contact info)  Fall Prevention Patient [place] sheet  [place] [facility] map with highlight of [place] 3. Provided introductory explanation of radiation treatment including SIM planning and purpose of Aquaplast head and shoulder mask, showed them example.   4. Provided and discussed education handouts for PEG. 5. He will be scheduled for the 5/23 H&N [facility]fer pending [facility] authorization.  6. Provided a tour of SIM and Tomo areas, explained treatment and arrival procedures. 7. I encouraged them to contact me with questions/concerns as treatments/procedures begin.  They verbalized understanding of information provided.    Gayleen Orem, RN, BSN, [place] at [place] [phone]       Time Spent with Patient: 75 (07/19/15 0905)

## 2015-07-21 NOTE — Telephone Encounter (Signed)
  Oncology Nurse Navigator Documentation  Navigator Location: [facility] (07/21/15 1015) Navigator Encounter Type: Telephone (07/21/15 1015)                       Confirmed with Verdie Drown disc with [facility] PET in patient's chart, asked that it be delivered to [facility] for upload.  Gayleen Orem, RN, BSN, [place] at [place] [phone]                       Time Spent with Patient: 15 (07/21/15 1015)

## 2015-07-25 ENCOUNTER — Telehealth: Payer: Self-pay | Admitting: *Deleted

## 2015-07-25 NOTE — Telephone Encounter (Signed)
  Oncology Nurse Navigator Documentation  Navigator Location: [facility] (07/25/15 1202) Navigator Encounter Type: Telephone (07/25/15 1202) Telephone: [place] Call (07/25/15 1202)             Barriers/Navigation Needs: Coordination of Care (07/25/15 1202)       Spoke with Randy Hanson who confirmed he wants Dr. Marin Hanson to administer Randy chemotherapy, is willing to use Randy own insurance for this service.  I spoke with Dr. Merrie Hanson, shared this information, she indicated support of Randy decision.  I called patient again, spoke with Randy Hanson, informed her of my conversation with Dr. Merrie Hanson, she expressed appreciation for update.  Hanson indicated Randy Hanson has a dental appt at [facility] tomorrow.  She understands that appts with Drs. Randy Hanson for treatment planning are pending s/p dental extractions.  I routed this note to Drs. Randy Hanson, Dr. Antonieta Pert RN, Randy Hanson.  Randy Orem, RN, BSN, [place] at [place] [phone]                       Time Spent with Patient: 30 (07/25/15 1202)

## 2015-07-31 ENCOUNTER — Telehealth: Payer: Self-pay | Admitting: *Deleted

## 2015-07-31 NOTE — Telephone Encounter (Signed)
On 07-31-15 fax medical records to [facility] health care [facility] it was consult note.

## 2015-08-08 ENCOUNTER — Telehealth: Payer: Self-pay | Admitting: *Deleted

## 2015-08-08 NOTE — Telephone Encounter (Signed)
  Oncology Nurse Navigator Documentation  Navigator Location: [facility] (08/08/15 1353) Navigator Encounter Type: Telephone (08/08/15 1353) Telephone: Outgoing Call;Patient Update (08/08/15 1353)                     Called Randy Hanson for an update regarding [facility] with [facility], spoke with his wife as he was not available.  She indicated he had 3 extractions and a root canal last week at [facility].  He has a follow-up appointment tomorrow  afternoon, 5/31 at 2:30, I explained that when he is cleared by dentistry, a radiation planning session will be scheduled as soon as feasible.  Subsequently, the start of radiation and chemotherapy with Dr. Jorje Guild will be coordinated together.  She voiced understanding. I asked that Randy Hanson give me a call after his dental visit to provide an update.  She agreed to have him do so.  Gayleen Orem, RN, BSN, [place] at [place] [phone]                       Time Spent with Patient: 15 (08/08/15 1353)

## 2015-08-11 ENCOUNTER — Telehealth: Payer: Self-pay | Admitting: *Deleted

## 2015-08-11 NOTE — Telephone Encounter (Signed)
  Oncology Nurse Navigator Documentation  Navigator Location: [facility] (08/11/15 1521) Navigator Encounter Type: Telephone (08/11/15 1521) Telephone: Incoming Call;Patient Update (08/11/15 1521)             Barriers/Navigation Needs: Coordination of Care (08/11/15 1521)       Received VMM from patient's wife indicating he was released by [facility] dentist per his 5/31 follow-up.  I forwarded update to Drs. Ennever and [employer].  Gayleen Orem, RN, BSN, [place] at [place] [phone]                       Time Spent with Patient: 15 (08/11/15 1521)

## 2015-08-15 ENCOUNTER — Telehealth: Payer: Self-pay | Admitting: *Deleted

## 2015-08-15 NOTE — Telephone Encounter (Signed)
CALLED PATIENT TO INFORM OF COMING IN FOR LAB ON 08-16-15 @ 2 PM, SPOKE WITH PATIENT'S WIFE - SANDRA AND SHE IS IN AGREEABLE TO THIS APPT.

## 2015-08-16 ENCOUNTER — Encounter: Payer: Self-pay | Admitting: *Deleted

## 2015-08-16 ENCOUNTER — Ambulatory Visit
Admission: RE | Admit: 2015-08-16 | Discharge: 2015-08-16 | Disposition: A | Discharge status: Home / Self Care | Payer: Non-veteran care | Source: Ambulatory Visit | Attending: Radiation Oncology | Admitting: Radiation Oncology

## 2015-08-16 ENCOUNTER — Ambulatory Visit
Admission: RE | Admit: 2015-08-16 | Discharge: 2015-08-16 | Disposition: A | Discharge status: Home / Self Care | Payer: Medicare Other | Source: Ambulatory Visit | Attending: Radiation Oncology | Admitting: Radiation Oncology

## 2015-08-16 VITALS — BP 156/86 | HR 91 | Temp 97.5°F | Resp 20 | Ht 72.0 in | Wt 237.4 lb

## 2015-08-16 DIAGNOSIS — C090 Malignant neoplasm of tonsillar fossa: Secondary | ICD-10-CM

## 2015-08-16 DIAGNOSIS — Z510 Encounter for antineoplastic radiation therapy: Secondary | ICD-10-CM | POA: Diagnosis not present

## 2015-08-16 LAB — TSH: TSH: 0.323 m(IU)/L (ref 0.320–4.118)

## 2015-08-16 MED ORDER — SODIUM CHLORIDE 0.9% FLUSH
10.0000 mL | Freq: Once | INTRAVENOUS | Status: AC
  Administered 2015-08-16: 10 mL via INTRAVENOUS

## 2015-08-16 NOTE — Progress Notes (Signed)
Head and Neck Cancer Simulation, IMRT treatment planning, and Special treatment procedure note   Outpatient  Diagnosis:    ICD-9-CM ICD-10-CM   1. Carcinoma of tonsillar fossa (HCC) 146.1 C09.0     The patient was taken to the CT simulator and laid in the supine position on the table. An Aquaplast head and shoulder mask was custom fitted to the patient's anatomy. High-resolution CT axial imaging was obtained of the head and neck with contrast. I verified that the quality of the imaging is good for treatment planning. 1 Medically Necessary Treatment Device was fabricated and supervised by me: Aquaplast mask.   Treatment planning note I plan to treat the patient with IMRT. I plan to treat the patient's tumor and bilateral  neck nodes. I plan to treat to a total dose of 70 Gray in 35  fractions. Dose calculation was ordered from dosimetry.  IMRT planning Note  IMRT is an important modality to deliver adequate dose to the patient's at risk tissues while sparing the patient's normal structures, including the: esophagus, parotid tissue, mandible, brain stem, spinal cord, oral cavity, brachial plexus.  This justifies the use of IMRT in the patient's treatment.   Special Treatment Procedure Note:  The patient will be receiving chemotherapy concurrently. Chemotherapy heightens the risk of side effects. I have considered this during the patient's treatment planning process and will monitor the patient accordingly for side effects on a weekly basis. Concurrent chemotherapy increases the complexity of this patient's treatment and therefore this constitutes a special treatment procedure.  -----------------------------------  Eppie Gibson, MD

## 2015-08-16 NOTE — Progress Notes (Signed)
Does patient have an allergy to IV contrast dye?: No.   Has patient ever received premedication for IV contrast dye?: N/A  Does patient take metformin?: No.  If patient does take metformin when was the last dose: N/A  Date of lab work: 07/12/15 BUN: 19.8 CR: 1.1 Asked patient to give name and dob as identification IV site: Right hand, 22g 1 inch catheter needle, x 1 attempt, excellent blood return, flushed with 10nml normal salaine, tolerated well,no c/o"Thata went in easy"  BP 156/86 mmHg  Pulse 91  Temp(Src) 97.5 F (36.4 C) (Oral)  Resp 20  Ht 6' (1.829 m)  Wt 237 lb 6.4 oz (107.684 kg)  BMI 32.19 kg/m2  SpO2 100%

## 2015-08-16 NOTE — Progress Notes (Signed)
  Oncology Nurse Navigator Documentation  Navigator Location: [facility] (08/16/15 1455)             Patient Visit Type: HVFMBB (08/16/15 1455) Treatment Phase: CT SIM (08/16/15 1455)         Met with Randy Hanson following his CT SIM.  He tolerated procedure without difficulty.  Showed him and his wife the Tomo treatment area, explained arrival, preparation and treatment procedures.  They voiced understanding of information provided.  Gayleen Orem, RN, BSN, [place] at [place] [phone]                       Time Spent with Patient: 30 (08/16/15 1455)

## 2015-08-17 ENCOUNTER — Telehealth: Payer: Self-pay | Admitting: *Deleted

## 2015-08-17 ENCOUNTER — Other Ambulatory Visit: Payer: Self-pay | Admitting: Nurse Practitioner

## 2015-08-17 DIAGNOSIS — C090 Malignant neoplasm of tonsillar fossa: Secondary | ICD-10-CM

## 2015-08-17 NOTE — Telephone Encounter (Signed)
  Oncology Nurse Navigator Documentation  Navigator Location: [facility] (08/17/15 1129) Navigator Encounter Type: Telephone (08/17/15 1129) Telephone: [place] Call (08/17/15 1129)                 Interventions: Coordination of Care (08/17/15 1129)   Coordination of Care: Other (08/17/15 1129)     Spoke with Dr. Antonieta Pert RN Charlsie Merles, inquired about his ordering of PET/PEG placement for Mr. Fedorov.  She indicated that order will be placed in context of Mr. [facility] appt tomorrow with Dr. Durel Salts, that Dr. Marin Olp typically refers patients to [facility] IR for these procedures.  I noted his RT is scheduled to start on 08/28/15, she acknowledged awareness.  I informed I will be providing Mr. Stranz PEG care/use instruction to supplement that provided by [facility] IR.  Gayleen Orem, RN, BSN, [place] at [place] [phone]                Time Spent with Patient: 15 (08/17/15 1129)

## 2015-08-18 ENCOUNTER — Ambulatory Visit (HOSPITAL_BASED_OUTPATIENT_CLINIC_OR_DEPARTMENT_OTHER): Payer: Medicare Other | Admitting: Hematology & Oncology

## 2015-08-18 ENCOUNTER — Other Ambulatory Visit (HOSPITAL_BASED_OUTPATIENT_CLINIC_OR_DEPARTMENT_OTHER): Payer: Medicare Other

## 2015-08-18 ENCOUNTER — Encounter: Payer: Self-pay | Admitting: Hematology & Oncology

## 2015-08-18 VITALS — BP 114/72 | HR 88 | Temp 97.7°F | Resp 16 | Ht 72.0 in | Wt 238.0 lb

## 2015-08-18 DIAGNOSIS — C090 Malignant neoplasm of tonsillar fossa: Secondary | ICD-10-CM

## 2015-08-18 DIAGNOSIS — C181 Malignant neoplasm of appendix: Secondary | ICD-10-CM | POA: Diagnosis not present

## 2015-08-18 DIAGNOSIS — C099 Malignant neoplasm of tonsil, unspecified: Secondary | ICD-10-CM

## 2015-08-18 LAB — CBC WITH DIFFERENTIAL (CANCER CENTER ONLY)
BASO#: 0.1 10*3/uL (ref 0.0–0.2)
BASO%: 0.9 % (ref 0.0–2.0)
EOS%: 4.8 % (ref 0.0–7.0)
Eosinophils Absolute: 0.3 10*3/uL (ref 0.0–0.5)
HEMATOCRIT: 40.8 % (ref 38.7–49.9)
HGB: 14.3 g/dL (ref 13.0–17.1)
LYMPH#: 1.6 10*3/uL (ref 0.9–3.3)
LYMPH%: 25.2 % (ref 14.0–48.0)
MCH: 31.3 pg (ref 28.0–33.4)
MCHC: 35 g/dL (ref 32.0–35.9)
MCV: 89 fL (ref 82–98)
MONO#: 0.4 10*3/uL (ref 0.1–0.9)
MONO%: 6.9 % (ref 0.0–13.0)
NEUT#: 4 10*3/uL (ref 1.5–6.5)
NEUT%: 62.2 % (ref 40.0–80.0)
Platelets: 233 10*3/uL (ref 145–400)
RBC: 4.57 10*6/uL (ref 4.20–5.70)
RDW: 12.3 % (ref 11.1–15.7)
WBC: 6.4 10*3/uL (ref 4.0–10.0)

## 2015-08-18 LAB — COMPREHENSIVE METABOLIC PANEL (CC13)
A/G RATIO: 1.4 (ref 1.2–2.2)
ALK PHOS: 71 IU/L (ref 39–117)
ALT: 14 IU/L (ref 0–44)
AST: 18 IU/L (ref 0–40)
Albumin, Serum: 4.2 g/dL (ref 3.5–4.8)
BUN/Creatinine Ratio: 20 (ref 10–24)
BUN: 19 mg/dL (ref 8–27)
Bilirubin Total: <0.2 mg/dL (ref 0.0–1.2)
Calcium, Ser: 9.7 mg/dL (ref 8.6–10.2)
Carbon Dioxide, Total: 24 mmol/L (ref 18–29)
Chloride, Ser: 100 mmol/L (ref 96–106)
Creatinine, Ser: 0.97 mg/dL (ref 0.76–1.27)
GFR calc Af Amer: 90 mL/min/{1.73_m2} (ref >59)
GFR calc non Af Amer: 78 mL/min/{1.73_m2} (ref >59)
GLOBULIN, TOTAL: 2.9 g/dL (ref 1.5–4.5)
Glucose: 176 mg/dL — ABNORMAL HIGH (ref 65–99)
POTASSIUM: 3.5 mmol/L (ref 3.5–5.2)
SODIUM: 136 mmol/L (ref 134–144)
Total Protein: 7.1 g/dL (ref 6.0–8.5)

## 2015-08-18 NOTE — Progress Notes (Signed)
Hematology and Oncology Follow Up Visit  Randy Hanson [id number] 01-23-1941 75 y.o. 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 He 5 has gotten all the approvals that he needs. He is a Cytogeneticist. 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 They will let him be treated locally.  He has had dental work done. He has had teeth removed.  He looks fantastic. He's gained weight. He is eating well. He does not have pain.  He has seen radiation oncology. They like to have a feeding tube put into him. He does not want to have one. I think we can try to go without having a feeding tube put in since he is quite large and hopefully will continue to gain some weight and till the effects of radiation will hit him.  He has not noted any problems with dysphagia or odynophagia. He's had no cough. He's had no shortness of breath. He's had no change in bowel or bladder habits. He's had no nausea or vomiting.  He's had no leg swelling. He's had no rashes.  Overall, his performance status is ECOG 0.  Medications:  Current outpatient prescriptions:  .  aspirin 81 MG tablet, Take 81 mg by mouth daily., Disp: , Rfl:  .  b complex vitamins tablet, Take 1 tablet by mouth daily., Disp: , Rfl:  .  hydrochlorothiazide (HYDRODIURIL) 25 MG tablet, Take 25 mg by mouth daily., Disp: , Rfl:  .  losartan (COZAAR) 50 MG tablet, Take 50 mg by mouth daily., Disp: , Rfl:  .  Omega-3 Fatty Acids (FISH OIL) 1000 MG CAPS, Take 1 capsule by mouth daily., Disp: , Rfl:  .  Pantothenic Acid 500 MG TABS, Take 1 tablet by mouth daily., Disp: , Rfl:  .  ranitidine (ZANTAC) 150 MG capsule, Take 150 mg by mouth 2 (two) times daily., Disp: , Rfl:  .  terazosin (HYTRIN) 2 MG capsule, Take 2 mg by mouth at bedtime., Disp: , Rfl:  .  vitamin E 400 UNIT  capsule, Take 400 Units by mouth daily. Reported on 07/19/2015, Disp: , Rfl:   Allergies:  Allergies  Allergen Reactions  . Lisinopril Cough    Past Medical History, Surgical history, Social history, and Family History were reviewed and updated.  Review of Systems: As above  Physical Exam:  height is 6' (1.829 m) and weight is 238 lb (107.956 kg). His oral temperature is 97.7 F (36.5 C). His blood pressure is 114/72 and his pulse is 88. His respiration is 16.   Wt Readings from Last 3 Encounters:  08/18/15 238 lb (107.956 kg)  08/16/15 237 lb 6.4 oz (107.684 kg)  07/19/15 239 lb 6.4 oz (108.591 kg)     Well-developed and well-nourished white male in no obvious distress. Head and neck exam shows no ocular or oral lesions. He has a healing biopsy scar in the right tonsillar fossa area and he does have some palpable right cervical lymph nodes. None measure larger than 2 cm. Thyroid is nonpalpable. Lungs are clear to percussion and ask rotation bilaterally. Cardiac exam regular rate and rhythm with no murmurs, rubs or bruits. Abdomen is soft. Has good bowel sounds. He is mildly obese. He has no slow-wave. There is no palpable hepatosplenomegaly. Back exam  shows no tenderness of the spine, ribs or hips. Extremities shows no clubbing, cyanosis or edema. Neurological exam shows no focal neurological deficits. Skin exam shows no rashes, ecchymoses or petechia.  Lab Results  Component Value Date   WBC 6.4 08/18/2015   HGB 14.3 08/18/2015   HCT 40.8 08/18/2015   MCV 89 08/18/2015   PLT 233 08/18/2015     Chemistry      Component Value Date/Time   NA 139 07/12/2015 1145   K 4.1 07/12/2015 1145   CO2 28 07/12/2015 1145   BUN 19.8 07/12/2015 1145   CREATININE 1.1 07/12/2015 1145      Component Value Date/Time   CALCIUM 10.0 07/12/2015 1145   ALKPHOS 61 07/12/2015 1145   AST 20 07/12/2015 1145   ALT 16 07/12/2015 1145   BILITOT 0.47 07/12/2015 1145         Impression and  Plan: Randy Hanson is 75 year old white male. He looks a lot younger. He is incredibly fit. He actually has a stage I-HPV+ - squamous cell carcinoma the right tonsil. The prognosis should be  incredibly good for him given that this is HPV positive.  I believe that single agent chemotherapy with weekly cisplatin should be adequate. I think this will be highly effective with radiation therapy. I don't see that we need to incorporate Erbitux.  He will need to have a Port-A-Cath placed. We will set this up for next week.  I think with low-dose cisplatin, he really should not have a lot of side effects from this. I don't expect him to have any kind of hair loss, nausea or vomiting, tinnitus, or decreased blood counts with increased risk of infection.  I think the majority of side effects will be from the radiation. I told him that the cis-platinum is mostly to augment the radiation therapies affect.  Again, he does not want have a feeding tube placed. Hopefully we can get away without having one placed.  I told him to eat and drink as much as he can until he starts having side effects from the radiation.  We will get started with treatment on June 16. He starts radiation on June 19.  We will have to see him weekly. I'll plan to see him back on June 23.  I spent about 36 Ms. Withee and his wife. It is very nice to see him. 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 We will certainly him and cure will be the only outcome that I will except.  Randy Macho, MD 6/9/20174:23 PM

## 2015-08-19 LAB — PREALBUMIN: PREALBUMIN: 30 mg/dL (ref 9–32)

## 2015-08-21 ENCOUNTER — Telehealth: Payer: Self-pay | Admitting: *Deleted

## 2015-08-21 LAB — LACTATE DEHYDROGENASE: LDH: 149 U/L (ref 125–245)

## 2015-08-21 NOTE — Telephone Encounter (Signed)
  Oncology Nurse Navigator Documentation  Navigator Location: [facility] (08/21/15 1705)   Telephone: Jerilee Hoh Confirmation/Clarification (08/21/15 1705)                     Spoke with patient's wife, explained 0745 appt time for Friday radiation treatments at [facility] immediately followed by infusions at [place] with Dr. Marin Olp.  She voiced understanding.  Gayleen Orem, RN, BSN, [place] at [place] [phone]                       Time Spent with Patient: 15 (08/21/15 1705)

## 2015-08-21 NOTE — Telephone Encounter (Addendum)
  Oncology Nurse Navigator Documentation  Navigator Location: [facility] (08/21/15 1114) Navigator Encounter Type: Telephone (08/21/15 1114) Telephone: Outgoing Call (08/21/15 1114)                 Interventions: Coordination of Care (08/21/15 1114)       Following my conversation with Alanda Amass Lead, spoke with RN Charlsie Merles regarding patient's upcoming Friday appt times when he will be receiving RT at [facility] and chemo at [facility].  She voiced understanding that patient's Friday RTs are being adjusted from late afternoon to 0745 to avoid missed treatments that may result from complications/delays of chemo infusion.  I confirmed with Tomo arrangement is acceptable.  Gayleen Orem, RN, BSN, [place] at [place] [phone]                      Time Spent with Patient: 15 (08/21/15 1114)

## 2015-08-22 ENCOUNTER — Encounter: Payer: Self-pay | Admitting: Nurse Practitioner

## 2015-08-22 NOTE — Progress Notes (Signed)
Spoke with wife regarding VA coverage for Dr. Antonieta Pert services and chemotherapy. She stated "she only wants to be involved with Dr. Marin Olp and to receive chemo at our site". She is aware of the VA benefits requirements and stated she spoke to them personally and informed them she would not be seeing their Oncology team and driving back and forth to [place] or [place] to receive treatment each week. Dr. Marin Olp is aware of her decision and patient states she is going to use their private [employer] and [place] insurance for his services rendered at [facility]. VA stated the [facility] radiation etc. Charges are still covered under their services. Baxter Flattery is aware of the situation and patient may proceed with port placement and chemotherapy as scheduled this week with [facility] with current insurance plans. Pt is aware and will proceed this week as previously scheduled.

## 2015-08-23 ENCOUNTER — Encounter: Payer: Self-pay | Admitting: *Deleted

## 2015-08-23 ENCOUNTER — Other Ambulatory Visit: Payer: Medicare Other

## 2015-08-23 ENCOUNTER — Other Ambulatory Visit: Payer: Self-pay | Admitting: Radiology

## 2015-08-23 ENCOUNTER — Other Ambulatory Visit: Payer: Self-pay | Admitting: *Deleted

## 2015-08-23 DIAGNOSIS — C090 Malignant neoplasm of tonsillar fossa: Secondary | ICD-10-CM

## 2015-08-23 DIAGNOSIS — Z510 Encounter for antineoplastic radiation therapy: Secondary | ICD-10-CM | POA: Diagnosis not present

## 2015-08-23 MED ORDER — LIDOCAINE-PRILOCAINE 2.5-2.5 % EX CREA: TOPICAL_CREAM | CUTANEOUS | Status: DC

## 2015-08-23 MED ORDER — LORAZEPAM 0.5 MG PO TABS: 0.5000 mg | ORAL_TABLET | Freq: Four times a day (QID) | ORAL | Status: DC | PRN

## 2015-08-23 MED ORDER — DEXAMETHASONE 4 MG PO TABS: ORAL_TABLET | ORAL | Status: DC

## 2015-08-23 MED ORDER — PROCHLORPERAZINE MALEATE 10 MG PO TABS: 10.0000 mg | ORAL_TABLET | Freq: Four times a day (QID) | ORAL | Status: DC | PRN

## 2015-08-23 MED ORDER — ONDANSETRON HCL 8 MG PO TABS: 8.0000 mg | ORAL_TABLET | Freq: Two times a day (BID) | ORAL | Status: DC | PRN

## 2015-08-24 ENCOUNTER — Other Ambulatory Visit: Payer: Self-pay | Admitting: Hematology & Oncology

## 2015-08-24 ENCOUNTER — Ambulatory Visit (HOSPITAL_COMMUNITY)
Admission: RE | Admit: 2015-08-24 | Discharge: 2015-08-24 | Disposition: A | Discharge status: Home / Self Care | Payer: Medicare Other | Source: Ambulatory Visit | Attending: Hematology & Oncology | Admitting: Hematology & Oncology

## 2015-08-24 ENCOUNTER — Encounter (HOSPITAL_COMMUNITY): Payer: Self-pay

## 2015-08-24 DIAGNOSIS — Z452 Encounter for adjustment and management of vascular access device: Secondary | ICD-10-CM | POA: Diagnosis not present

## 2015-08-24 DIAGNOSIS — C760 Malignant neoplasm of head, face and neck: Secondary | ICD-10-CM | POA: Insufficient documentation

## 2015-08-24 DIAGNOSIS — C099 Malignant neoplasm of tonsil, unspecified: Secondary | ICD-10-CM

## 2015-08-24 LAB — CBC WITH DIFFERENTIAL/PLATELET
Basophils Absolute: 0 10*3/uL (ref 0.0–0.1)
Basophils Relative: 0 %
EOS ABS: 0.3 10*3/uL (ref 0.0–0.7)
EOS PCT: 6 %
HCT: 42.6 % (ref 39.0–52.0)
Hemoglobin: 14.3 g/dL (ref 13.0–17.0)
LYMPHS ABS: 1.5 10*3/uL (ref 0.7–4.0)
Lymphocytes Relative: 28 %
MCH: 30.2 pg (ref 26.0–34.0)
MCHC: 33.6 g/dL (ref 30.0–36.0)
MCV: 89.9 fL (ref 78.0–100.0)
MONO ABS: 0.4 10*3/uL (ref 0.1–1.0)
Monocytes Relative: 7 %
Neutro Abs: 3.3 10*3/uL (ref 1.7–7.7)
Neutrophils Relative %: 59 %
PLATELETS: 219 10*3/uL (ref 150–400)
RBC: 4.74 MIL/uL (ref 4.22–5.81)
RDW: 12.9 % (ref 11.5–15.5)
WBC: 5.6 10*3/uL (ref 4.0–10.5)

## 2015-08-24 LAB — PROTIME-INR
INR: 1.09 (ref 0.00–1.49)
PROTHROMBIN TIME: 14.3 s (ref 11.6–15.2)

## 2015-08-24 LAB — APTT: aPTT: 28 seconds (ref 24–37)

## 2015-08-24 MED ORDER — HEPARIN SOD (PORK) LOCK FLUSH 100 UNIT/ML IV SOLN
INTRAVENOUS | Status: AC
  Filled 2015-08-24: qty 5

## 2015-08-24 MED ORDER — LIDOCAINE HCL 1 % IJ SOLN
INTRAMUSCULAR | Status: AC
  Filled 2015-08-24: qty 20

## 2015-08-24 MED ORDER — HEPARIN SOD (PORK) LOCK FLUSH 100 UNIT/ML IV SOLN
INTRAVENOUS | Status: DC | PRN
  Administered 2015-08-24: 200 [IU]

## 2015-08-24 MED ORDER — MIDAZOLAM HCL 2 MG/2ML IJ SOLN
INTRAMUSCULAR | Status: AC
  Filled 2015-08-24: qty 2

## 2015-08-24 MED ORDER — FENTANYL CITRATE (PF) 100 MCG/2ML IJ SOLN
INTRAMUSCULAR | Status: DC | PRN
  Administered 2015-08-24 (×2): 50 ug via INTRAVENOUS

## 2015-08-24 MED ORDER — LIDOCAINE HCL 1 % IJ SOLN
INTRAMUSCULAR | Status: DC | PRN
  Administered 2015-08-24: 20 mL

## 2015-08-24 MED ORDER — SODIUM CHLORIDE 0.9 % IV SOLN
INTRAVENOUS | Status: DC
  Administered 2015-08-24: 09:00:00 via INTRAVENOUS

## 2015-08-24 MED ORDER — FENTANYL CITRATE (PF) 100 MCG/2ML IJ SOLN
INTRAMUSCULAR | Status: AC
  Filled 2015-08-24: qty 2

## 2015-08-24 MED ORDER — CEFAZOLIN SODIUM-DEXTROSE 2-4 GM/100ML-% IV SOLN
INTRAVENOUS | Status: AC
  Filled 2015-08-24: qty 100

## 2015-08-24 MED ORDER — CEFAZOLIN SODIUM-DEXTROSE 2-4 GM/100ML-% IV SOLN
2.0000 g | Freq: Once | INTRAVENOUS | Status: AC
Start: 2015-08-24 — End: 2015-08-24
  Administered 2015-08-24: 2 g via INTRAVENOUS

## 2015-08-24 MED ORDER — MIDAZOLAM HCL 2 MG/2ML IJ SOLN
INTRAMUSCULAR | Status: DC | PRN
  Administered 2015-08-24 (×2): 1 mg via INTRAVENOUS

## 2015-08-24 NOTE — Sedation Documentation (Signed)
Vital signs stable. 

## 2015-08-24 NOTE — Sedation Documentation (Signed)
Patient is resting comfortably. 

## 2015-08-24 NOTE — Discharge Instructions (Signed)
Implanted Port Insertion, Care After °Refer to this sheet in the next few weeks. These instructions provide you with information on caring for yourself after your procedure. Your health care provider may also give you more specific instructions. Your treatment has been planned according to current medical practices, but problems sometimes occur. Call your health care provider if you have any problems or questions after your procedure. °WHAT TO EXPECT AFTER THE PROCEDURE °After your procedure, it is typical to have the following:  °· Discomfort at the port insertion site. Ice packs to the area will help. °· Bruising on the skin over the port. This will subside in 3-4 days. °HOME CARE INSTRUCTIONS °· After your port is placed, you will get a manufacturer's information card. The card has information about your port. Keep this card with you at all times.   °· Know what kind of port you have. There are many types of ports available.   °· Wear a medical alert bracelet in case of an emergency. This can help alert health care workers that you have a port.   °· The port can stay in for as long as your health care provider believes it is necessary.   °· A home health care nurse may give medicines and take care of the port.   °· You or a family member can get special training and directions for giving medicine and taking care of the port at home.   °SEEK MEDICAL CARE IF:  °· Your port does not flush or you are unable to get a blood return.   °· You have a fever or chills. °SEEK IMMEDIATE MEDICAL CARE IF: °· You have new fluid or pus coming from your incision.   °· You notice a bad smell coming from your incision site.   °· You have swelling, pain, or more redness at the incision or port site.   °· You have chest pain or shortness of breath. °  °This information is not intended to replace advice given to you by your health care provider. Make sure you discuss any questions you have with your health care provider. °  °Document  Released: 12/16/2012 Document Revised: 03/02/2013 Document Reviewed: 12/16/2012 °Elsevier Interactive Patient Education ©2016 Elsevier Inc. ° °

## 2015-08-24 NOTE — Sedation Documentation (Signed)
Vital signs stable. Pt is resting, no complaints at this time.

## 2015-08-24 NOTE — Sedation Documentation (Signed)
Pt complains of pain 

## 2015-08-24 NOTE — H&P (Signed)
Chief Complaint: Patient was seen in consultation today for [facility] a cath placement at the request of Hanson,Randy R  Referring Physician(s): Hanson,Randy R  Supervising Physician: Randy Hanson  Patient Status: Outpatient  History of Present Illness: Randy Hanson is a 75 y.o. male   New dx tonsillar cancer Need for chemotherapy To start in am Request for [facility] placement Scheduled today for same  Past Medical History  Diagnosis Date  . Hypertension   . Esophageal reflux   . Back pain   . Hypercholesteremia   . FHx: colonic polyps   . Hemorrhoids   . Hyperlipemia   . DJD (degenerative joint disease)   . BPH (benign prostatic hyperplasia)   . Posterior vitreous detachment, left eye     DR. KATHERINE Hanson  AUGUST 2011  . Kidney cysts     [facility] [place] [facility]2012  . Cancer of tonsil, palatine ([place]) 07/12/2015    Past Surgical History  Procedure Laterality Date  . Vasectomy  1975  . Cardiac catheterization      remote >15 years ago    Allergies: Lisinopril  Medications: Prior to Admission medications   Medication Sig Start Date End Date Taking? Authorizing Provider  aspirin 81 MG tablet Take 81 mg by mouth daily.   Yes Historical Provider, MD  b complex vitamins tablet Take 1 tablet by mouth daily.   Yes Historical Provider, MD  hydrochlorothiazide (HYDRODIURIL) 25 MG tablet Take 25 mg by mouth daily.   Yes Historical Provider, MD  losartan (COZAAR) 50 MG tablet Take 50 mg by mouth daily.   Yes Historical Provider, MD  Omega-3 Fatty Acids (FISH OIL) 1000 MG CAPS Take 1 capsule by mouth daily.   Yes Historical Provider, MD  Pantothenic Acid 500 MG TABS Take 1 tablet by mouth daily.   Yes Historical Provider, MD  ranitidine (ZANTAC) 150 MG capsule Take 150 mg by mouth 2 (two) times daily.   Yes Historical Provider, MD  terazosin (HYTRIN) 2 MG capsule Take 2 mg by mouth at bedtime.   Yes Historical Provider, MD  dexamethasone (DECADRON) 4 MG  tablet Take 2 tablets by mouth once a day on the day after chemotherapy and then take 2 tablets two times a day for 2 days. Take with food. 08/23/15   Randy Napoleon, MD  lidocaine-prilocaine (EMLA) cream Apply to affected area as needed 08/23/15   Randy Napoleon, MD  LORazepam (ATIVAN) 0.5 MG tablet Take 1 tablet (0.5 mg total) by mouth every 6 (six) hours as needed (Nausea or vomiting). 08/23/15   Randy Napoleon, MD  ondansetron (ZOFRAN) 8 MG tablet Take 1 tablet (8 mg total) by mouth 2 (two) times daily as needed. Start on the third day after chemotherapy. 08/23/15   Randy Napoleon, MD  prochlorperazine (COMPAZINE) 10 MG tablet Take 1 tablet (10 mg total) by mouth every 6 (six) hours as needed (Nausea or vomiting). 08/23/15   Randy Napoleon, MD     Family History  Problem Relation Age of Onset  . Hypertension Mother     Social History   Social History  . Marital Status: Married    Spouse Name: N/A  . Number of Children: N/A  . Years of Education: N/A   Social History Main Topics  . Smoking status: Former Research scientist (life sciences)  . Smokeless tobacco: None     Comment: quit 35 years ago  . Alcohol Use: No  . Drug Use: No  .  Sexual Activity: Not Asked   Other Topics Concern  . None   Social History Narrative   TOBACCO USE CIGARETTES: NEVER SMOKED.NO SMOKING.NO ALCOHOL .CAFFEINE YES:NO  RECREATIONAL DRUGS. OCCUPATION :RETIRED   MARTIAL STATUS : MARRIED       Review of Systems: A 12 point ROS discussed and pertinent positives are indicated in the HPI above.  All other systems are negative.  Review of Systems  Constitutional: Negative for fever, activity change and fatigue.  HENT: Negative for sore throat.   Respiratory: Negative for cough and shortness of breath.   Gastrointestinal: Negative for abdominal pain.  Neurological: Negative for weakness.  Psychiatric/Behavioral: Negative for behavioral problems and confusion.    Vital Signs: BP 135/81 mmHg  Pulse 75  Temp(Src) 97.8 F  (36.6 C)  Resp 20  Ht 6' (1.829 m)  Wt 231 lb (104.781 kg)  BMI 31.32 kg/m2  SpO2 97%  Physical Exam  Constitutional: He is oriented to person, place, and time.  Cardiovascular: Normal rate, regular rhythm and normal heart sounds.   Pulmonary/Chest: Effort normal and breath sounds normal.  Abdominal: Soft. Bowel sounds are normal.  Musculoskeletal: Normal range of motion.  Neurological: He is alert and oriented to person, place, and time.  Skin: Skin is warm and dry.  Psychiatric: He has a normal mood and affect. His behavior is normal. Judgment and thought content normal.  Nursing note and vitals reviewed.   Mallampati Score:  MD Evaluation Airway: WNL Heart: WNL Abdomen: WNL ASA  Classification: 3 Mallampati/Airway Score: Two  Imaging: No results found.  Labs:  CBC:  Recent Labs  07/12/15 1144 08/18/15 1503 08/24/15 0859  WBC 5.9 6.4 5.6  HGB 15.1 14.3 14.3  HCT 42.2 40.8 42.6  PLT 214 233 219    COAGS:  Recent Labs  08/24/15 0859  INR 1.09  APTT 28    BMP:  Recent Labs  07/12/15 1145 08/18/15 1503  NA 139 136  K 4.1 3.5  CL  --  100  CO2 28 24  GLUCOSE 98 176*  BUN 19.8 19  CALCIUM 10.0 9.7  CREATININE 1.1 0.97  GFRNONAA  --  78  GFRAA  --  90    LIVER FUNCTION TESTS:  Recent Labs  07/12/15 1145 08/18/15 1503  BILITOT 0.47 <0.2  AST 20 18  ALT 16 14  ALKPHOS 61 71  PROT 7.2 7.1  ALBUMIN 3.9 4.2    TUMOR MARKERS: No results for input(s): AFPTM, CEA, CA199, CHROMGRNA in the last 8760 hours.  Assessment and Plan:  Tonsillar Ca For Port A Cath today---starts chemo in am Risks and Benefits discussed with the patient including, but not limited to bleeding, infection, pneumothorax, or fibrin sheath development and need for additional procedures. All of the patient's questions were answered, patient is agreeable to proceed. Consent signed and in chart.   Thank you for this interesting consult.  I greatly enjoyed meeting  Randy Hanson [facility] and look forward to participating in their care.  A copy of this report was sent to the requesting provider on this date.  Electronically Signed: Dory Hanson A 08/24/2015, 9:44 AM   I spent a total of  30 Minutes   in face to face in clinical consultation, greater than 50% of which was counseling/coordinating care for [facility]

## 2015-08-24 NOTE — Procedures (Signed)
LIJV PAC SVC RA No comp/EBL 

## 2015-08-25 ENCOUNTER — Other Ambulatory Visit: Payer: No Typology Code available for payment source

## 2015-08-25 ENCOUNTER — Ambulatory Visit (HOSPITAL_BASED_OUTPATIENT_CLINIC_OR_DEPARTMENT_OTHER): Payer: Medicare Other

## 2015-08-25 VITALS — BP 138/75 | HR 79 | Temp 97.9°F | Resp 20

## 2015-08-25 DIAGNOSIS — C099 Malignant neoplasm of tonsil, unspecified: Secondary | ICD-10-CM

## 2015-08-25 DIAGNOSIS — Z5111 Encounter for antineoplastic chemotherapy: Secondary | ICD-10-CM | POA: Diagnosis not present

## 2015-08-25 DIAGNOSIS — C090 Malignant neoplasm of tonsillar fossa: Secondary | ICD-10-CM

## 2015-08-25 MED ORDER — POTASSIUM CHLORIDE 2 MEQ/ML IV SOLN
Freq: Once | INTRAVENOUS | Status: AC
  Administered 2015-08-25: 09:00:00 via INTRAVENOUS
  Filled 2015-08-25: qty 10

## 2015-08-25 MED ORDER — PALONOSETRON HCL INJECTION 0.25 MG/5ML
0.2500 mg | Freq: Once | INTRAVENOUS | Status: AC
  Administered 2015-08-25: 0.25 mg via INTRAVENOUS

## 2015-08-25 MED ORDER — CISPLATIN CHEMO INJECTION 100MG/100ML
40.0000 mg/m2 | Freq: Once | INTRAVENOUS | Status: AC
  Administered 2015-08-25: 94 mg via INTRAVENOUS
  Filled 2015-08-25: qty 94

## 2015-08-25 MED ORDER — PALONOSETRON HCL INJECTION 0.25 MG/5ML
INTRAVENOUS | Status: AC
  Filled 2015-08-25: qty 5

## 2015-08-25 MED ORDER — SODIUM CHLORIDE 0.9% FLUSH
10.0000 mL | INTRAVENOUS | Status: DC | PRN
  Administered 2015-08-25: 10 mL
  Filled 2015-08-25: qty 10

## 2015-08-25 MED ORDER — SODIUM CHLORIDE 0.9 % IV SOLN
Freq: Once | INTRAVENOUS | Status: AC
  Administered 2015-08-25: 09:00:00 via INTRAVENOUS

## 2015-08-25 MED ORDER — SODIUM CHLORIDE 0.9 % IV SOLN
Freq: Once | INTRAVENOUS | Status: AC
  Administered 2015-08-25: 10:00:00 via INTRAVENOUS
  Filled 2015-08-25: qty 5

## 2015-08-25 MED ORDER — HEPARIN SOD (PORK) LOCK FLUSH 100 UNIT/ML IV SOLN
500.0000 [IU] | Freq: Once | INTRAVENOUS | Status: AC | PRN
  Administered 2015-08-25: 500 [IU]
  Filled 2015-08-25: qty 5

## 2015-08-25 NOTE — Patient Instructions (Signed)
[facility] Discharge Instructions for Patients Receiving Chemotherapy  Today you received the following chemotherapy agents:  Cisplatin  To help prevent nausea and vomiting after your treatment, we encourage you to take your nausea medications as directed. If you develop nausea and vomiting that is not controlled by your nausea medication, call the clinic.   BELOW ARE SYMPTOMS THAT SHOULD BE REPORTED IMMEDIATELY:  *FEVER GREATER THAN 100.5 F  *CHILLS WITH OR WITHOUT FEVER  NAUSEA AND VOMITING THAT IS NOT CONTROLLED WITH YOUR NAUSEA MEDICATION  *UNUSUAL SHORTNESS OF BREATH  *UNUSUAL BRUISING OR BLEEDING  TENDERNESS IN MOUTH AND THROAT WITH OR WITHOUT PRESENCE OF ULCERS  *URINARY PROBLEMS  *BOWEL PROBLEMS  UNUSUAL RASH Items with * indicate a potential emergency and should be followed up as soon as possible.  Feel free to call the clinic you have any questions or concerns. The clinic phone number is [phone].  Please show the [place] at check-in to the Emergency Department and triage nurse. Cisplatin injection What is this medicine? CISPLATIN (SIS pla tin) is a chemotherapy drug. It targets fast dividing cells, like cancer cells, and causes these cells to die. This medicine is used to treat many types of cancer like bladder, ovarian, and testicular cancers. This medicine may be used for other purposes; ask your health care provider or pharmacist if you have questions. What should I tell my health care provider before I take this medicine? They need to know if you have any of these conditions: -blood disorders -hearing problems -kidney disease -recent or ongoing radiation therapy -an unusual or allergic reaction to cisplatin, carboplatin, other chemotherapy, other medicines, foods, dyes, or preservatives -pregnant or trying to get pregnant -breast-feeding How should I use this medicine? This drug is given as an infusion into a vein. It is  administered in a hospital or clinic by a specially trained health care professional. Talk to your pediatrician regarding the use of this medicine in children. Special care may be needed. Overdosage: If you think you have taken too much of this medicine contact a poison control center or emergency room at once. NOTE: This medicine is only for you. Do not share this medicine with others. What if I miss a dose? It is important not to miss a dose. Call your doctor or health care professional if you are unable to keep an appointment. What may interact with this medicine? -dofetilide -foscarnet -medicines for seizures -medicines to increase blood counts like filgrastim, pegfilgrastim, sargramostim -probenecid -pyridoxine used with altretamine -rituximab -some antibiotics like amikacin, gentamicin, neomycin, polymyxin B, streptomycin, tobramycin -sulfinpyrazone -vaccines -zalcitabine Talk to your doctor or health care professional before taking any of these medicines: -acetaminophen -aspirin -ibuprofen -ketoprofen -naproxen This list may not describe all possible interactions. Give your health care provider a list of all the medicines, herbs, non-prescription drugs, or dietary supplements you use. Also tell them if you smoke, drink alcohol, or use illegal drugs. Some items may interact with your medicine. What should I watch for while using this medicine? Your condition will be monitored carefully while you are receiving this medicine. You will need important blood work done while you are taking this medicine. This drug may make you feel generally unwell. This is not uncommon, as chemotherapy can affect healthy cells as well as cancer cells. Report any side effects. Continue your course of treatment even though you feel ill unless your doctor tells you to stop. In some cases, you may be given additional medicines  to help with side effects. Follow all directions for their use. Call your doctor  or health care professional for advice if you get a fever, chills or sore throat, or other symptoms of a cold or flu. Do not treat yourself. This drug decreases your body's ability to fight infections. Try to avoid being around people who are sick. This medicine may increase your risk to bruise or bleed. Call your doctor or health care professional if you notice any unusual bleeding. Be careful brushing and flossing your teeth or using a toothpick because you may get an infection or bleed more easily. If you have any dental work done, tell your dentist you are receiving this medicine. Avoid taking products that contain aspirin, acetaminophen, ibuprofen, naproxen, or ketoprofen unless instructed by your doctor. These medicines may hide a fever. Do not become pregnant while taking this medicine. Women should inform their doctor if they wish to become pregnant or think they might be pregnant. There is a potential for serious side effects to an unborn child. Talk to your health care professional or pharmacist for more information. Do not breast-feed an infant while taking this medicine. Drink fluids as directed while you are taking this medicine. This will help protect your kidneys. Call your doctor or health care professional if you get diarrhea. Do not treat yourself. What side effects may I notice from receiving this medicine? Side effects that you should report to your doctor or health care professional as soon as possible: -allergic reactions like skin rash, itching or hives, swelling of the face, lips, or tongue -signs of infection - fever or chills, cough, sore throat, pain or difficulty passing urine -signs of decreased platelets or bleeding - bruising, pinpoint red spots on the skin, black, tarry stools, nosebleeds -signs of decreased red blood cells - unusually weak or tired, fainting spells, lightheadedness -breathing problems -changes in hearing -gout pain -low blood counts - This drug may  decrease the number of white blood cells, red blood cells and platelets. You may be at increased risk for infections and bleeding. -nausea and vomiting -pain, swelling, redness or irritation at the injection site -pain, tingling, numbness in the hands or feet -problems with balance, movement -trouble passing urine or change in the amount of urine Side effects that usually do not require medical attention (report to your doctor or health care professional if they continue or are bothersome): -changes in vision -loss of appetite -metallic taste in the mouth or changes in taste This list may not describe all possible side effects. Call your doctor for medical advice about side effects. You may report side effects to FDA at 1-800-FDA-1088. Where should I keep my medicine? This drug is given in a hospital or clinic and will not be stored at home. NOTE: This sheet is a summary. It may not cover all possible information. If you have questions about this medicine, talk to your doctor, pharmacist, or health care provider.    2016, Elsevier/Gold Standard. (2007-06-02 14:40:54)    

## 2015-08-28 ENCOUNTER — Encounter: Payer: Self-pay | Admitting: *Deleted

## 2015-08-28 ENCOUNTER — Ambulatory Visit
Admission: RE | Admit: 2015-08-28 | Discharge: 2015-08-28 | Disposition: A | Discharge status: Home / Self Care | Payer: Non-veteran care | Source: Ambulatory Visit | Attending: Radiation Oncology | Admitting: Radiation Oncology

## 2015-08-28 ENCOUNTER — Telehealth: Payer: Self-pay

## 2015-08-28 ENCOUNTER — Ambulatory Visit
Admission: RE | Admit: 2015-08-28 | Discharge: 2015-08-28 | Disposition: A | Discharge status: Home / Self Care | Payer: Medicare Other | Source: Ambulatory Visit | Attending: Radiation Oncology | Admitting: Radiation Oncology

## 2015-08-28 ENCOUNTER — Ambulatory Visit
Admission: RE | Admit: 2015-08-28 | Discharge: 2015-08-28 | Disposition: A | Discharge status: Home / Self Care | Payer: No Typology Code available for payment source | Source: Ambulatory Visit | Attending: Radiation Oncology | Admitting: Radiation Oncology

## 2015-08-28 ENCOUNTER — Encounter: Payer: Self-pay | Admitting: Radiation Oncology

## 2015-08-28 VITALS — BP 151/84 | HR 65 | Temp 97.8°F | Ht 72.0 in | Wt 238.4 lb

## 2015-08-28 DIAGNOSIS — C090 Malignant neoplasm of tonsillar fossa: Secondary | ICD-10-CM

## 2015-08-28 DIAGNOSIS — Z510 Encounter for antineoplastic radiation therapy: Secondary | ICD-10-CM | POA: Diagnosis not present

## 2015-08-28 MED ORDER — SONAFINE EX EMUL
1.0000 "application " | Freq: Once | CUTANEOUS | Status: AC
  Administered 2015-08-28: 1 via TOPICAL

## 2015-08-28 NOTE — Telephone Encounter (Signed)
Patient call back for chemo follow up today. Patient doing well except he stated that he is little constipated but taking miralax for it.

## 2015-08-28 NOTE — Progress Notes (Signed)
IMRT Device Note    ICD-9-CM ICD-10-CM   1. Carcinoma of tonsillar fossa (HCC) 146.1 C09.0     9.7 delivered field widths represent one set of IMRT treatment devices. The code is [phone].  -----------------------------------  Eppie Gibson, MD

## 2015-08-28 NOTE — Progress Notes (Signed)
  Oncology Nurse Navigator Documentation  Navigator Location: [facility] (08/28/15 1210) Navigator Encounter Type: Treatment (08/28/15 1210)   Abnormal Finding Date: 06/14/15 (08/28/15 1210) Confirmed Diagnosis Date: 07/12/15 (08/28/15 1210)   Treatment Initiated Date: 08/25/15 (08/28/15 1210) Patient Visit Type: RadOnc (08/28/15 1210) Treatment Phase: First Radiation Tx (08/28/15 1210) Barriers/Navigation Needs: No barriers at this time (08/28/15 1210)       To provide support, encouragement and care continuity, met with Mr. Mclennan during his New Start Tomo.  He was accompanied by his wife. Wife viewed this initial treatment, therapist explained procedure.  She voiced appreciation for the learning opportunity. Mr. Toledo tolerated treatment without difficulty, denied any questions or needs. They understand I can be contacted.  Gayleen Orem, RN, BSN, [place] [facility] [place] [phone]                        Time Spent with Patient: 45 (08/28/15 1210)

## 2015-08-28 NOTE — Progress Notes (Signed)
[name] is here for his 1st fraction of radiation to his Right Tonsil and Bilateral Neck. He denies pain except some soreness to his PAC site. He received his first Chemotherapy this past Friday and it went well. He does report feeling tired after the chemotherapy. He was provided with education today, and sonafine cream to begin using twice daily. He denies throat pain. He reports he is eating well without any difficulty. He reports some constipation and has tried miralax without much relief.  BP 151/84 mmHg  Pulse 65  Temp(Src) 97.8 F (36.6 C)  Ht 6' (1.829 m)  Wt 238 lb 6.4 oz (108.138 kg)  BMI 32.33 kg/m2  SpO2 94%   Wt Readings from Last 3 Encounters:  08/28/15 238 lb 6.4 oz (108.138 kg)  08/24/15 231 lb (104.781 kg)  08/18/15 238 lb (107.956 kg)

## 2015-08-28 NOTE — Progress Notes (Signed)
   Weekly Management Note:  Outpatient    ICD-9-CM ICD-10-CM   1. Carcinoma of tonsillar fossa (HCC) 146.1 C09.0     Current Dose:  2 Gy  Projected Dose: 70 Gy   Narrative:  The patient presents for routine under treatment assessment.  CBCT/MVCT images/Port film x-rays were reviewed.  The chart was checked. Doing well.  He denies pain except some soreness to his PAC site. He received his first Chemotherapy this past Friday and it went well. He does report feeling tired after the chemotherapy. He was provided with education today, and sonafine cream to begin using twice daily. He denies throat pain. He reports he is eating well without any difficulty. He reports some constipation and has tried miralax without much relief.  Physical Findings:  Wt Readings from Last 3 Encounters:  08/28/15 238 lb 6.4 oz (108.138 kg)  08/24/15 231 lb (104.781 kg)  08/18/15 238 lb (107.956 kg)    height is 6' (1.829 m) and weight is 238 lb 6.4 oz (108.138 kg). His temperature is 97.8 F (36.6 C). His blood pressure is 151/84 and his pulse is 65. His oxygen saturation is 94%.  NAD, fullness in right neck. No obvious tumor in oral cavity/ oropharynx - brisk gag reflex  CBC    Component Value Date/Time   WBC 5.6 08/24/2015 0859   WBC 6.4 08/18/2015 1503   RBC 4.74 08/24/2015 0859   RBC 4.57 08/18/2015 1503   HGB 14.3 08/24/2015 0859   HGB 14.3 08/18/2015 1503   HCT 42.6 08/24/2015 0859   HCT 40.8 08/18/2015 1503   PLT 219 08/24/2015 0859   PLT 233 08/18/2015 1503   MCV 89.9 08/24/2015 0859   MCV 89 08/18/2015 1503   MCH 30.2 08/24/2015 0859   MCH 31.3 08/18/2015 1503   MCHC 33.6 08/24/2015 0859   MCHC 35.0 08/18/2015 1503   RDW 12.9 08/24/2015 0859   RDW 12.3 08/18/2015 1503   LYMPHSABS 1.5 08/24/2015 0859   LYMPHSABS 1.6 08/18/2015 1503   MONOABS 0.4 08/24/2015 0859   EOSABS 0.3 08/24/2015 0859   EOSABS 0.3 08/18/2015 1503   BASOSABS 0.0 08/24/2015 0859   BASOSABS 0.1 08/18/2015 1503      CMP     Component Value Date/Time   NA 136 08/18/2015 1503   NA 139 07/12/2015 1145   K 3.5 08/18/2015 1503   K 4.1 07/12/2015 1145   CL 100 08/18/2015 1503   CO2 24 08/18/2015 1503   CO2 28 07/12/2015 1145   GLUCOSE 176* 08/18/2015 1503   GLUCOSE 98 07/12/2015 1145   BUN 19 08/18/2015 1503   BUN 19.8 07/12/2015 1145   CREATININE 0.97 08/18/2015 1503   CREATININE 1.1 07/12/2015 1145   CALCIUM 9.7 08/18/2015 1503   CALCIUM 10.0 07/12/2015 1145   PROT 7.1 08/18/2015 1503   PROT 7.2 07/12/2015 1145   ALBUMIN 4.2 08/18/2015 1503   ALBUMIN 3.9 07/12/2015 1145   AST 18 08/18/2015 1503   AST 20 07/12/2015 1145   ALT 14 08/18/2015 1503   ALT 16 07/12/2015 1145   ALKPHOS 71 08/18/2015 1503   ALKPHOS 61 07/12/2015 1145   BILITOT <0.2 08/18/2015 1503   BILITOT 0.47 07/12/2015 1145   GFRNONAA 78 08/18/2015 1503   GFRAA 90 08/18/2015 1503     Impression:  The patient is tolerating radiotherapy.   Plan:  Continue radiotherapy as planned.  Pt will take Mag Citrate tonight; will use Miralax daily for constipation  -----------------------------------  Eppie Gibson, MD

## 2015-08-28 NOTE — Progress Notes (Signed)
Pt here for patient teaching.  Pt given Radiation and You booklet, Managing Acute Radiation Side Effects for Head and Neck Cancer handout, skin care instructions and Sonafine. Pt reports they have not watched the Radiation Therapy Education video, but was given a link to watch at home.  Reviewed areas of pertinence such as fatigue, mouth changes, skin changes, throat changes, breast swelling, cough, shortness of breath, earaches and taste changes . Pt able to give teach back of to pat skin, use unscented/gentle soap and drink plenty of water,apply Sonafine bid, avoid applying anything to skin within 4 hours of treatment and to use an electric razor if they must shave. Pt verbalizes understanding of information given and will contact nursing with any questions or concerns.     Http://rtanswers.[email or web address]/treatmentinformation/whattoexpect/index  Managing Acute Radiation Side Effects for Head and Neck Cancer  Skin irritation:  . Sonafine  Topical Emulsion: First-line topical cream to help soothe skin irritation.  Apply to skin in radiation fields at least 4 hours before radiotherapy, or any time after treatments during the rest of the day.  . Triple Antibiotic Ointment (Neosporin): Apply to areas of skin with moist breakdown to prevent infection.  . 1% hydrocortisone cream: Apply to areas of skin that are itching, up to three times a day.  Arnetha Massy (Silver Sulfadiazine): Used in select cases if large patches of skin develop moist breakdown (let physician or nurse know if you have a "sulfa" drug allergy)  Soreness in mouth or throat: . Baking Soda Rinse: a home remedy to soothe/cleanse mouth and loosen thick saliva.  Mix 1/2 teaspoon salt, 1/2 teaspoon baking soda, 1 pint water (16 oz or two cups).  Swish, gargle and spit as needed to soothe/cleanse mouth. Use as often as you want.  . Sucralfate (Carafate): coats throat to soothe it before meals or any time of day. Crush 1 tablet in 10 mL H20 and  swallow up to four times a day.  . 2% viscous Lidocaine (Magic Mouth Wash): Soothes mouth and/or throat by numbing your mucous membranes. Mix 1 part 2% viscous lidocaine (Magic Mouth Wash), 1 part H20. Swish and/or swallow 6mL of this mixture, 11min before meals and at bedtime, up to four times a day. Alternate with Sucralfate (Carafate).  . Narcotics: Various short acting and long acting narcotics can be prescribed.  Often, medical oncology will prescribe these if you are receiving chemotherapy concurrently. Narcotics may cause constipation. It may be helpful to take a stool softener (Docusate Sodium) or gentle laxative (ie Senna or Polyethylene Glycol) to prevent constipation.  Having food in your stomach before ingesting a narcotic may reduce risk of stomach upset.  Thick Saliva: . Baking Soda Rinse: a home remedy to soothe/cleanse mouth and loosen thick saliva.  Mix 1/2 teaspoon salt, 1/2 teaspoon baking soda, 1 pint water (16 oz or two cups).  Swish, gargle, and spit as needed to soothe/cleanse mouth. Use as often as you want.  . Some patients find Diet Ginger Ale or Papaya Juice to be helpful.  . In extreme cases, your physician may consider prescribing a Scopolamine transdermal patch which dries up your saliva.     Poor taste, or lack of taste:   . There are no well-established medications to combat taste bud changes from radiotherapy.  It often takes weeks to months to regain taste function.  Eating bland foods and drinking nutritional shakes  may help you maintain your weight when food is not enjoyable.  Some patients supplement their oral  intake with a feeding tube.  Fatigue and weakness: . There is not a well-established safe and effective medication to combat radiation-induced fatigue.  However, if you are able to perform light exercise (such as a daily walk, yoga, recumbent stationary bicycling), this may combat fatigue and help you maintain muscle mass during  treatment.  . Maintaining hydration and nutrition are also important.  If you have not been referred to a nutritionist and would like a referral, please let your nurse or physician know.  . Try to get at least 8 hours of sleep each night. You may need a daily nap, but try not to nap so late that it interferes with your nightly sleep schedule.      

## 2015-08-29 ENCOUNTER — Ambulatory Visit
Admission: RE | Admit: 2015-08-29 | Discharge: 2015-08-29 | Disposition: A | Discharge status: Home / Self Care | Payer: Non-veteran care | Source: Ambulatory Visit | Attending: Radiation Oncology | Admitting: Radiation Oncology

## 2015-08-29 DIAGNOSIS — Z510 Encounter for antineoplastic radiation therapy: Secondary | ICD-10-CM | POA: Diagnosis not present

## 2015-08-30 ENCOUNTER — Ambulatory Visit
Admission: RE | Admit: 2015-08-30 | Discharge: 2015-08-30 | Disposition: A | Discharge status: Home / Self Care | Payer: Non-veteran care | Source: Ambulatory Visit | Attending: Radiation Oncology | Admitting: Radiation Oncology

## 2015-08-30 ENCOUNTER — Encounter: Payer: Self-pay | Admitting: Hematology & Oncology

## 2015-08-30 DIAGNOSIS — Z510 Encounter for antineoplastic radiation therapy: Secondary | ICD-10-CM | POA: Diagnosis not present

## 2015-08-31 ENCOUNTER — Encounter: Payer: Self-pay | Admitting: *Deleted

## 2015-08-31 ENCOUNTER — Ambulatory Visit
Admission: RE | Admit: 2015-08-31 | Discharge: 2015-08-31 | Disposition: A | Discharge status: Home / Self Care | Payer: Non-veteran care | Source: Ambulatory Visit | Attending: Radiation Oncology | Admitting: Radiation Oncology

## 2015-08-31 DIAGNOSIS — Z510 Encounter for antineoplastic radiation therapy: Secondary | ICD-10-CM | POA: Diagnosis not present

## 2015-08-31 NOTE — Progress Notes (Signed)
  Oncology Nurse Navigator Documentation  Navigator Location: [facility] (08/31/15 1400) Navigator Encounter Type: Other (08/31/15 1400) Telephone: Appt Confirmation/Clarification (08/31/15 1400)                   Spoke with Mr. Cardella and his wife prior to his Tomo treatment, confirmed his attendance at next Tuesday morning's H&N MDC, provided arrival time of 0830.  Answered wife's questions about appointment times noted in MyChart and notifications via automatic phone call reminders.  Gayleen Orem, RN, BSN, [place] [facility] [place] [phone]                         Time Spent with Patient: 30 (08/31/15 1400)

## 2015-09-01 ENCOUNTER — Ambulatory Visit (HOSPITAL_BASED_OUTPATIENT_CLINIC_OR_DEPARTMENT_OTHER): Payer: Medicare Other

## 2015-09-01 ENCOUNTER — Ambulatory Visit
Admission: RE | Admit: 2015-09-01 | Discharge: 2015-09-01 | Disposition: A | Discharge status: Home / Self Care | Payer: Non-veteran care | Source: Ambulatory Visit | Attending: Radiation Oncology | Admitting: Radiation Oncology

## 2015-09-01 ENCOUNTER — Other Ambulatory Visit (HOSPITAL_BASED_OUTPATIENT_CLINIC_OR_DEPARTMENT_OTHER): Payer: Medicare Other

## 2015-09-01 ENCOUNTER — Ambulatory Visit (HOSPITAL_BASED_OUTPATIENT_CLINIC_OR_DEPARTMENT_OTHER): Payer: Medicare Other | Admitting: Hematology & Oncology

## 2015-09-01 ENCOUNTER — Encounter: Payer: Self-pay | Admitting: Hematology & Oncology

## 2015-09-01 VITALS — BP 143/76 | HR 79 | Temp 97.5°F | Resp 20

## 2015-09-01 DIAGNOSIS — Z452 Encounter for adjustment and management of vascular access device: Secondary | ICD-10-CM | POA: Diagnosis not present

## 2015-09-01 DIAGNOSIS — C090 Malignant neoplasm of tonsillar fossa: Secondary | ICD-10-CM

## 2015-09-01 DIAGNOSIS — C099 Malignant neoplasm of tonsil, unspecified: Secondary | ICD-10-CM

## 2015-09-01 DIAGNOSIS — Z5111 Encounter for antineoplastic chemotherapy: Secondary | ICD-10-CM | POA: Diagnosis present

## 2015-09-01 DIAGNOSIS — Z510 Encounter for antineoplastic radiation therapy: Secondary | ICD-10-CM | POA: Diagnosis not present

## 2015-09-01 LAB — CMP (CANCER CENTER ONLY)
ALBUMIN: 3.3 g/dL (ref 3.3–5.5)
ALK PHOS: 63 U/L (ref 26–84)
ALT: 30 U/L (ref 10–47)
AST: 25 U/L (ref 11–38)
BILIRUBIN TOTAL: 0.8 mg/dL (ref 0.20–1.60)
BUN, Bld: 20 mg/dL (ref 7–22)
CALCIUM: 9.2 mg/dL (ref 8.0–10.3)
CO2: 30 mEq/L (ref 18–33)
Chloride: 98 mEq/L (ref 98–108)
Creat: 0.9 mg/dl (ref 0.6–1.2)
GLUCOSE: 112 mg/dL (ref 73–118)
POTASSIUM: 3.8 meq/L (ref 3.3–4.7)
Sodium: 133 mEq/L (ref 128–145)
TOTAL PROTEIN: 6.7 g/dL (ref 6.4–8.1)

## 2015-09-01 LAB — CBC WITH DIFFERENTIAL (CANCER CENTER ONLY)
BASO#: 0 10*3/uL (ref 0.0–0.2)
BASO%: 0.2 % (ref 0.0–2.0)
EOS ABS: 0.2 10*3/uL (ref 0.0–0.5)
EOS%: 2.3 % (ref 0.0–7.0)
HCT: 41.1 % (ref 38.7–49.9)
HGB: 14.8 g/dL (ref 13.0–17.1)
LYMPH#: 1 10*3/uL (ref 0.9–3.3)
LYMPH%: 9.9 % — AB (ref 14.0–48.0)
MCH: 32 pg (ref 28.0–33.4)
MCHC: 36 g/dL — AB (ref 32.0–35.9)
MCV: 89 fL (ref 82–98)
MONO#: 1 10*3/uL — AB (ref 0.1–0.9)
MONO%: 9.7 % (ref 0.0–13.0)
NEUT#: 8 10*3/uL — ABNORMAL HIGH (ref 1.5–6.5)
NEUT%: 77.9 % (ref 40.0–80.0)
PLATELETS: 235 10*3/uL (ref 145–400)
RBC: 4.63 10*6/uL (ref 4.20–5.70)
RDW: 12.4 % (ref 11.1–15.7)
WBC: 10.2 10*3/uL — ABNORMAL HIGH (ref 4.0–10.0)

## 2015-09-01 MED ORDER — POTASSIUM CHLORIDE 2 MEQ/ML IV SOLN
Freq: Once | INTRAVENOUS | Status: AC
  Administered 2015-09-01: 11:00:00 via INTRAVENOUS
  Filled 2015-09-01: qty 10

## 2015-09-01 MED ORDER — CISPLATIN CHEMO INJECTION 100MG/100ML
40.0000 mg/m2 | Freq: Once | INTRAVENOUS | Status: AC
  Administered 2015-09-01: 94 mg via INTRAVENOUS
  Filled 2015-09-01: qty 94

## 2015-09-01 MED ORDER — STERILE WATER FOR INJECTION IJ SOLN
INTRAMUSCULAR | Status: AC
Start: 2015-09-01 — End: 2015-09-01
  Filled 2015-09-01: qty 10

## 2015-09-01 MED ORDER — HEPARIN SOD (PORK) LOCK FLUSH 100 UNIT/ML IV SOLN
500.0000 [IU] | Freq: Once | INTRAVENOUS | Status: AC | PRN
  Administered 2015-09-01: 500 [IU]
  Filled 2015-09-01: qty 5

## 2015-09-01 MED ORDER — PALONOSETRON HCL INJECTION 0.25 MG/5ML
0.2500 mg | Freq: Once | INTRAVENOUS | Status: AC
  Administered 2015-09-01: 0.25 mg via INTRAVENOUS

## 2015-09-01 MED ORDER — SODIUM CHLORIDE 0.9% FLUSH
10.0000 mL | INTRAVENOUS | Status: DC | PRN
  Administered 2015-09-01: 10 mL
  Filled 2015-09-01: qty 10

## 2015-09-01 MED ORDER — ALTEPLASE 2 MG IJ SOLR
INTRAMUSCULAR | Status: AC
  Filled 2015-09-01: qty 2

## 2015-09-01 MED ORDER — SODIUM CHLORIDE 0.9 % IV SOLN
Freq: Once | INTRAVENOUS | Status: AC
  Administered 2015-09-01: 11:00:00 via INTRAVENOUS

## 2015-09-01 MED ORDER — PALONOSETRON HCL INJECTION 0.25 MG/5ML
INTRAVENOUS | Status: AC
  Filled 2015-09-01: qty 5

## 2015-09-01 MED ORDER — ALTEPLASE 2 MG IJ SOLR
2.0000 mg | Freq: Once | INTRAMUSCULAR | Status: AC | PRN
  Administered 2015-09-01: 2 mg
  Filled 2015-09-01: qty 2

## 2015-09-01 MED ORDER — FOSAPREPITANT DIMEGLUMINE INJECTION 150 MG
Freq: Once | INTRAVENOUS | Status: AC
  Administered 2015-09-01: 13:00:00 via INTRAVENOUS
  Filled 2015-09-01: qty 5

## 2015-09-01 NOTE — Progress Notes (Signed)
Hematology and Oncology Follow Up Visit  Randy Hanson [id number] 1941-01-18 75 y.o. 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     Interim History:  Randy Hanson is back for follow-up. He is doing pretty well. He has had 1 week of radiation and one cycle of cis-platinum. He is tolerating this well so far. He is still able to eat what he wants. His mouth is somewhat dry. I told him to rinse his mouth out 10 times a day with water/baking soda.  He's had no problems with pain. He's had no nausea or vomiting. He's had no change in bowel or bladder habits.  He's had no leg swelling. He has had no rashes.  He's not noted any hearing difficulties.   Overall, his performance status is ECOG 1.  Medications:  Current outpatient prescriptions:  .  aspirin 81 MG tablet, Take 81 mg by mouth daily., Disp: , Rfl:  .  b complex vitamins tablet, Take 1 tablet by mouth daily., Disp: , Rfl:  .  dexamethasone (DECADRON) 4 MG tablet, Take 2 tablets by mouth once a day on the day after chemotherapy and then take 2 tablets two times a day for 2 days. Take with food., Disp: 30 tablet, Rfl: 1 .  hydrochlorothiazide (HYDRODIURIL) 25 MG tablet, Take 25 mg by mouth daily., Disp: , Rfl:  .  HYDROcodone-acetaminophen (NORCO/VICODIN) 5-325 MG tablet, Take 1 tablet by mouth every 6 (six) hours as needed for moderate pain (Pt [facility], does not dose.). Reported on 08/28/2015, Disp: , Rfl:  .  lidocaine-prilocaine (EMLA) cream, Apply to affected area as needed, Disp: 30 g, Rfl: 3 .  LORazepam (ATIVAN) 0.5 MG tablet, Take 1 tablet (0.5 mg total) by mouth every 6 (six) hours as needed (Nausea or vomiting)., Disp: 30 tablet, Rfl: 0 .  losartan (COZAAR) 50 MG tablet, Take 50 mg by mouth daily., Disp: , Rfl:  .  Omega-3 Fatty Acids (FISH OIL) 1000 MG CAPS, Take 1 capsule by mouth daily.,  Disp: , Rfl:  .  ondansetron (ZOFRAN) 8 MG tablet, Take 1 tablet (8 mg total) by mouth 2 (two) times daily as needed. Start on the third day after chemotherapy., Disp: 30 tablet, Rfl: 1 .  Pantothenic Acid 500 MG TABS, Take 1 tablet by mouth daily. Reported on 08/28/2015, Disp: , Rfl:  .  prochlorperazine (COMPAZINE) 10 MG tablet, Take 1 tablet (10 mg total) by mouth every 6 (six) hours as needed (Nausea or vomiting)., Disp: 30 tablet, Rfl: 1 .  ranitidine (ZANTAC) 150 MG capsule, Take 150 mg by mouth 2 (two) times daily., Disp: , Rfl:  .  terazosin (HYTRIN) 2 MG capsule, Take 2 mg by mouth at bedtime., Disp: , Rfl:  .  Wound Dressings (SONAFINE), Apply 1 application topically 3 (three) times daily., Disp: , Rfl:   Allergies:  Allergies  Allergen Reactions  . Lisinopril Cough    Past Medical History, Surgical history, Social history, and Family History were reviewed and updated.  Review of Systems: As above  Physical Exam:  vitals were not taken for this visit.  Wt Readings from Last 3 Encounters:  08/28/15 238 lb 6.4 oz (108.138 kg)  08/24/15 231 lb (104.781 kg)  08/18/15 238 lb (107.956 kg)     Well-developed and well-nourished white male in no obvious distress. Head  and neck exam shows no ocular or oral lesions. He has a healing biopsy scar in the right tonsillar fossa area and he does have some palpable right cervical lymph nodes. None measure larger than 2 cm. Thyroid is nonpalpable. Lungs are clear to percussion and ask rotation bilaterally. Cardiac exam regular rate and rhythm with no murmurs, rubs or bruits. Abdomen is soft. Has good bowel sounds. He is mildly obese. He has no slow-wave. There is no palpable hepatosplenomegaly. Back exam shows no tenderness of the spine, ribs or hips. Extremities shows no clubbing, cyanosis or edema. Neurological exam shows no focal neurological deficits. Skin exam shows no rashes, ecchymoses or petechia.  Lab Results  Component Value Date    WBC 10.2* 09/01/2015   HGB 14.8 09/01/2015   HCT 41.1 09/01/2015   MCV 89 09/01/2015   PLT 235 09/01/2015     Chemistry      Component Value Date/Time   NA 133 09/01/2015 0906   NA 136 08/18/2015 1503   NA 139 07/12/2015 1145   K 3.8 09/01/2015 0906   K 3.5 08/18/2015 1503   K 4.1 07/12/2015 1145   CL 98 09/01/2015 0906   CL 100 08/18/2015 1503   CO2 30 09/01/2015 0906   CO2 24 08/18/2015 1503   CO2 28 07/12/2015 1145   BUN 20 09/01/2015 0906   BUN 19 08/18/2015 1503   BUN 19.8 07/12/2015 1145   CREATININE 0.9 09/01/2015 0906   CREATININE 0.97 08/18/2015 1503   CREATININE 1.1 07/12/2015 1145      Component Value Date/Time   CALCIUM 9.2 09/01/2015 0906   CALCIUM 9.7 08/18/2015 1503   CALCIUM 10.0 07/12/2015 1145   ALKPHOS 63 09/01/2015 0906   ALKPHOS 71 08/18/2015 1503   ALKPHOS 61 07/12/2015 1145   AST 25 09/01/2015 0906   AST 18 08/18/2015 1503   AST 20 07/12/2015 1145   ALT 30 09/01/2015 0906   ALT 14 08/18/2015 1503   ALT 16 07/12/2015 1145   BILITOT 0.80 09/01/2015 0906   BILITOT <0.2 08/18/2015 1503   BILITOT 0.47 07/12/2015 1145         Impression and Plan: Mr. Grissett is 75 year old white male. He looks a lot younger. He is incredibly fit. He actually has a stage I-HPV+ - squamous cell carcinoma the right tonsil. The prognosis should be  incredibly good for him given that this is HPV positive.  I will go ahead with week #2 of cisplatin. He will get IV fluids with the cisplatin. I did this will help him feel a little better.   I told him to keep eating what he can. This is definitely helpful.  I suspect that he may start having some side effects in the next couple weeks.  His weight is holding pretty stable. This is always a good sign.  We will continue to follow him weekly. I'll plan to see him back in one week.   [name], MD 6/23/201710:37 AM

## 2015-09-01 NOTE — Patient Instructions (Signed)
[facility] Discharge Instructions for Patients Receiving Chemotherapy  Today you received the following chemotherapy agents:  Cisplatin  To help prevent nausea and vomiting after your treatment, we encourage you to take your nausea medications as directed. If you develop nausea and vomiting that is not controlled by your nausea medication, call the clinic.   BELOW ARE SYMPTOMS THAT SHOULD BE REPORTED IMMEDIATELY:  *FEVER GREATER THAN 100.5 F  *CHILLS WITH OR WITHOUT FEVER  NAUSEA AND VOMITING THAT IS NOT CONTROLLED WITH YOUR NAUSEA MEDICATION  *UNUSUAL SHORTNESS OF BREATH  *UNUSUAL BRUISING OR BLEEDING  TENDERNESS IN MOUTH AND THROAT WITH OR WITHOUT PRESENCE OF ULCERS  *URINARY PROBLEMS  *BOWEL PROBLEMS  UNUSUAL RASH Items with * indicate a potential emergency and should be followed up as soon as possible.  Feel free to call the clinic you have any questions or concerns. The clinic phone number is [phone].  Please show the [facility] at check-in to the Emergency Department and triage nurse. Cisplatin injection What is this medicine? CISPLATIN (SIS pla tin) is a chemotherapy drug. It targets fast dividing cells, like cancer cells, and causes these cells to die. This medicine is used to treat many types of cancer like bladder, ovarian, and testicular cancers. This medicine may be used for other purposes; ask your health care provider or pharmacist if you have questions. What should I tell my health care provider before I take this medicine? They need to know if you have any of these conditions: -blood disorders -hearing problems -kidney disease -recent or ongoing radiation therapy -an unusual or allergic reaction to cisplatin, carboplatin, other chemotherapy, other medicines, foods, dyes, or preservatives -pregnant or trying to get pregnant -breast-feeding How should I use this medicine? This drug is given as an infusion into a vein. It is  administered in a hospital or clinic by a specially trained health care professional. Talk to your pediatrician regarding the use of this medicine in children. Special care may be needed. Overdosage: If you think you have taken too much of this medicine contact a poison control center or emergency room at once. NOTE: This medicine is only for you. Do not share this medicine with others. What if I miss a dose? It is important not to miss a dose. Call your doctor or health care professional if you are unable to keep an appointment. What may interact with this medicine? -dofetilide -foscarnet -medicines for seizures -medicines to increase blood counts like filgrastim, pegfilgrastim, sargramostim -probenecid -pyridoxine used with altretamine -rituximab -some antibiotics like amikacin, gentamicin, neomycin, polymyxin B, streptomycin, tobramycin -sulfinpyrazone -vaccines -zalcitabine Talk to your doctor or health care professional before taking any of these medicines: -acetaminophen -aspirin -ibuprofen -ketoprofen -naproxen This list may not describe all possible interactions. Give your health care provider a list of all the medicines, herbs, non-prescription drugs, or dietary supplements you use. Also tell them if you smoke, drink alcohol, or use illegal drugs. Some items may interact with your medicine. What should I watch for while using this medicine? Your condition will be monitored carefully while you are receiving this medicine. You will need important blood work done while you are taking this medicine. This drug may make you feel generally unwell. This is not uncommon, as chemotherapy can affect healthy cells as well as cancer cells. Report any side effects. Continue your course of treatment even though you feel ill unless your doctor tells you to stop. In some cases, you may be given additional medicines  to help with side effects. Follow all directions for their use. Call your doctor  or health care professional for advice if you get a fever, chills or sore throat, or other symptoms of a cold or flu. Do not treat yourself. This drug decreases your body's ability to fight infections. Try to avoid being around people who are sick. This medicine may increase your risk to bruise or bleed. Call your doctor or health care professional if you notice any unusual bleeding. Be careful brushing and flossing your teeth or using a toothpick because you may get an infection or bleed more easily. If you have any dental work done, tell your dentist you are receiving this medicine. Avoid taking products that contain aspirin, acetaminophen, ibuprofen, naproxen, or ketoprofen unless instructed by your doctor. These medicines may hide a fever. Do not become pregnant while taking this medicine. Women should inform their doctor if they wish to become pregnant or think they might be pregnant. There is a potential for serious side effects to an unborn child. Talk to your health care professional or pharmacist for more information. Do not breast-feed an infant while taking this medicine. Drink fluids as directed while you are taking this medicine. This will help protect your kidneys. Call your doctor or health care professional if you get diarrhea. Do not treat yourself. What side effects may I notice from receiving this medicine? Side effects that you should report to your doctor or health care professional as soon as possible: -allergic reactions like skin rash, itching or hives, swelling of the face, lips, or tongue -signs of infection - fever or chills, cough, sore throat, pain or difficulty passing urine -signs of decreased platelets or bleeding - bruising, pinpoint red spots on the skin, black, tarry stools, nosebleeds -signs of decreased red blood cells - unusually weak or tired, fainting spells, lightheadedness -breathing problems -changes in hearing -gout pain -low blood counts - This drug may  decrease the number of white blood cells, red blood cells and platelets. You may be at increased risk for infections and bleeding. -nausea and vomiting -pain, swelling, redness or irritation at the injection site -pain, tingling, numbness in the hands or feet -problems with balance, movement -trouble passing urine or change in the amount of urine Side effects that usually do not require medical attention (report to your doctor or health care professional if they continue or are bothersome): -changes in vision -loss of appetite -metallic taste in the mouth or changes in taste This list may not describe all possible side effects. Call your doctor for medical advice about side effects. You may report side effects to FDA at 1-800-FDA-1088. Where should I keep my medicine? This drug is given in a hospital or clinic and will not be stored at home. NOTE: This sheet is a summary. It may not cover all possible information. If you have questions about this medicine, talk to your doctor, pharmacist, or health care provider.    2016, [employer]. (2007-06-02 14:40:54)    

## 2015-09-01 NOTE — Progress Notes (Signed)
Weekly Management Note:  Outpatient    ICD-9-CM ICD-10-CM   1. Carcinoma of tonsillar fossa (HCC) 146.1 C09.0 lidocaine (XYLOCAINE) 2 % solution     sucralfate (CARAFATE) 1 g tablet    Current Dose:  12Gy  Projected Dose: 70 Gy     Narrative:  The patient presents for routine under treatment assessment.  CBCT/MVCT images/Port film x-rays were reviewed.  The chart was checked. He denies pain. He does report some fatigue, with naps in the afternoon at times. He is eating well per his report, without throat pain. He reports taste changes which makes it hard to eat at times. He is drinking about 30 ounces of water daily and gingerale. He reports thick saliva and a dry mouth, and is using the baking soda rinses frequently during the day. The skin to his radiation site is slightly red. He has not started using his sonafine cream, but will start today when he gets home. He reports some constipation, and takes laxatives every other day.    Physical Findings:  Wt Readings from Last 3 Encounters:  09/04/15 229 lb 11.2 oz (104.191 kg)  08/28/15 238 lb 6.4 oz (108.138 kg)  08/24/15 231 lb (104.781 kg)    height is 6' (1.829 m) and weight is 229 lb 11.2 oz (104.191 kg). His temperature is 97.6 F (36.4 C). His blood pressure is 132/76 and his pulse is 79. His oxygen saturation is 97%.  NAD, fullness in right neck. No obvious tumor in oral cavity/ oropharynx ; no thrush  CBC    Component Value Date/Time   WBC 10.2* 09/01/2015 0903   WBC 5.6 08/24/2015 0859   RBC 4.63 09/01/2015 0903   RBC 4.74 08/24/2015 0859   HGB 14.8 09/01/2015 0903   HGB 14.3 08/24/2015 0859   HCT 41.1 09/01/2015 0903   HCT 42.6 08/24/2015 0859   PLT 235 09/01/2015 0903   PLT 219 08/24/2015 0859   MCV 89 09/01/2015 0903   MCV 89.9 08/24/2015 0859   MCH 32.0 09/01/2015 0903   MCH 30.2 08/24/2015 0859   MCHC 36.0* 09/01/2015 0903   MCHC 33.6 08/24/2015 0859   RDW 12.4 09/01/2015 0903   RDW 12.9 08/24/2015 0859   LYMPHSABS 1.0 09/01/2015 0903   LYMPHSABS 1.5 08/24/2015 0859   MONOABS 0.4 08/24/2015 0859   EOSABS 0.2 09/01/2015 0903   EOSABS 0.3 08/24/2015 0859   BASOSABS 0.0 09/01/2015 0903   BASOSABS 0.0 08/24/2015 0859     CMP     Component Value Date/Time   NA 133 09/01/2015 0906   NA 136 08/18/2015 1503   NA 139 07/12/2015 1145   K 3.8 09/01/2015 0906   K 3.5 08/18/2015 1503   K 4.1 07/12/2015 1145   CL 98 09/01/2015 0906   CL 100 08/18/2015 1503   CO2 30 09/01/2015 0906   CO2 24 08/18/2015 1503   CO2 28 07/12/2015 1145   GLUCOSE 112 09/01/2015 0906   GLUCOSE 176* 08/18/2015 1503   GLUCOSE 98 07/12/2015 1145   BUN 20 09/01/2015 0906   BUN 19 08/18/2015 1503   BUN 19.8 07/12/2015 1145   CREATININE 0.9 09/01/2015 0906   CREATININE 0.97 08/18/2015 1503   CREATININE 1.1 07/12/2015 1145   CALCIUM 9.2 09/01/2015 0906   CALCIUM 9.7 08/18/2015 1503   CALCIUM 10.0 07/12/2015 1145   PROT 6.7 09/01/2015 0906   PROT 7.1 08/18/2015 1503   PROT 7.2 07/12/2015 1145   ALBUMIN 3.3 09/01/2015 0906   ALBUMIN 4.2  08/18/2015 1503   ALBUMIN 3.9 07/12/2015 1145   AST 25 09/01/2015 0906   AST 18 08/18/2015 1503   AST 20 07/12/2015 1145   ALT 30 09/01/2015 0906   ALT 14 08/18/2015 1503   ALT 16 07/12/2015 1145   ALKPHOS 63 09/01/2015 0906   ALKPHOS 71 08/18/2015 1503   ALKPHOS 61 07/12/2015 1145   BILITOT 0.80 09/01/2015 0906   BILITOT <0.2 08/18/2015 1503   BILITOT 0.47 07/12/2015 1145   GFRNONAA 78 08/18/2015 1503   GFRAA 90 08/18/2015 1503     Impression:  The patient is tolerating radiotherapy.   Plan:  Continue radiotherapy as planned. Rx as above for future esophagitis -----------------------------------  Eppie Gibson, MD

## 2015-09-04 ENCOUNTER — Ambulatory Visit
Admission: RE | Admit: 2015-09-04 | Discharge: 2015-09-04 | Disposition: A | Discharge status: Home / Self Care | Payer: Medicare Other | Source: Ambulatory Visit | Attending: Radiation Oncology | Admitting: Radiation Oncology

## 2015-09-04 ENCOUNTER — Ambulatory Visit
Admission: RE | Admit: 2015-09-04 | Discharge: 2015-09-04 | Disposition: A | Discharge status: Home / Self Care | Payer: Non-veteran care | Source: Ambulatory Visit | Attending: Radiation Oncology | Admitting: Radiation Oncology

## 2015-09-04 ENCOUNTER — Encounter: Payer: Self-pay | Admitting: Radiation Oncology

## 2015-09-04 VITALS — BP 132/76 | HR 79 | Temp 97.6°F | Ht 72.0 in | Wt 229.7 lb

## 2015-09-04 DIAGNOSIS — C090 Malignant neoplasm of tonsillar fossa: Secondary | ICD-10-CM

## 2015-09-04 DIAGNOSIS — Z510 Encounter for antineoplastic radiation therapy: Secondary | ICD-10-CM | POA: Diagnosis not present

## 2015-09-04 MED ORDER — SUCRALFATE 1 G PO TABS: ORAL_TABLET | ORAL | Status: DC

## 2015-09-04 MED ORDER — LIDOCAINE VISCOUS 2 % MT SOLN: OROMUCOSAL | Status: DC

## 2015-09-04 NOTE — Progress Notes (Addendum)
Mr. Cupit is here for his 6th fraction of radiation to his Right Tonsil/ Bilateral Neck. He denies pain. He does report some fatigue, with naps in the afternoon at times. He is eating well per his report, without throat pain. He reports taste changes which makes it hard to eat at times. He is drinking about 30 ounces of water daily and gingerale. He reports thick saliva and a dry mouth, and is using the baking soda rinses frequently during the day. The skin to his radiation site is slightly red. He has not started using his sonafine cream, but will start today when he gets home. He reports some constipation, and takes laxatives every other day.   BP 140/70 mmHg  Pulse 68  Temp(Src) 97.6 F (36.4 C)  Ht 6' (1.829 m)  Wt 229 lb 11.2 oz (104.191 kg)  BMI 31.15 kg/m2  SpO2 97%   Orthostatic Vitals: BP sitting 140/70 pulse 68, BP standing 132/76, pulse 79.  Wt Readings from Last 3 Encounters:  09/04/15 229 lb 11.2 oz (104.191 kg)  08/28/15 238 lb 6.4 oz (108.138 kg)  08/24/15 231 lb (104.781 kg)

## 2015-09-05 ENCOUNTER — Encounter: Payer: Self-pay | Admitting: *Deleted

## 2015-09-05 ENCOUNTER — Ambulatory Visit
Admission: RE | Admit: 2015-09-05 | Discharge: 2015-09-05 | Disposition: A | Discharge status: Home / Self Care | Payer: Non-veteran care | Source: Ambulatory Visit | Attending: Radiation Oncology | Admitting: Radiation Oncology

## 2015-09-05 ENCOUNTER — Ambulatory Visit: Payer: Medicare Other | Admitting: Physical Therapy

## 2015-09-05 ENCOUNTER — Ambulatory Visit: Payer: Medicare Other | Admitting: Nutrition

## 2015-09-05 ENCOUNTER — Ambulatory Visit: Payer: Medicare Other | Attending: Radiation Oncology

## 2015-09-05 VITALS — BP 124/64 | HR 90 | Temp 97.7°F | Ht 72.0 in | Wt 227.5 lb

## 2015-09-05 DIAGNOSIS — R293 Abnormal posture: Secondary | ICD-10-CM | POA: Insufficient documentation

## 2015-09-05 DIAGNOSIS — R29898 Other symptoms and signs involving the musculoskeletal system: Secondary | ICD-10-CM | POA: Insufficient documentation

## 2015-09-05 DIAGNOSIS — R1310 Dysphagia, unspecified: Secondary | ICD-10-CM | POA: Diagnosis not present

## 2015-09-05 DIAGNOSIS — C090 Malignant neoplasm of tonsillar fossa: Secondary | ICD-10-CM

## 2015-09-05 DIAGNOSIS — Z510 Encounter for antineoplastic radiation therapy: Secondary | ICD-10-CM | POA: Diagnosis not present

## 2015-09-05 NOTE — Progress Notes (Signed)
Patient was seen during Head and Neck Clinic.  75 year old male diagnosed with HPV positive tonsil cancer.  He is being seen by Dr. Marin Olp and Dr. Isidore Moos.  Past medical history includes hypertension, esophageal reflux, hypercholesterolemia, and tobacco usage 35 years ago.  Medications include B complex vitamins, fish oil, vitamin E and Zantac.  Labs include prealbumin 30, albumin 4.2, glucose 176 on June 9.  Height: 6 feet 0 inches. Weight: 227.5 pounds. Usual body weight: 226 pounds February 2016. BMI: 30.85.  Estimated nutrition needs: 2400-2600 calories, 125-135 grams protein, 2.6 L fluid.  Patient reports he is receiving weekly cisplatin and daily radiation therapy. He reports nausea after chemotherapy but denies vomiting. Reports decreased oral intake after chemotherapy infusion. He states he has taste alterations. Reports constipation. Patient does not have a feeding tube at this time.  Nutrition diagnosis:  Predicted suboptimal energy intake related to diagnosis of tonsil cancer and associated treatments as evidenced by history or presence of a condition for which research shows an increased incidence of suboptimal energy intake.  Intervention: Educated patient on the importance of small frequent, high-calorie, high-protein meals and snacks throughout the day to minimize weight loss and promote maintenance of lean body mass. Provided fact sheet on increasing calories and protein. Recommended patient consume four Ensure Plus or equivalent oral nutrition supplements daily to provide approximately 1400 cal, 52 grams protein. Reviewed strategies for eating with nausea and encouraged patient to take nausea medication. Educated patient on improving taste alterations. Educated patient on the importance of increased fluid intake. Provided fact sheets and answered questions.  Provided basic education on feeding tube placement.   Teach back method used.  Patient has high risk for  malnutrition and decreased oral intake, which could delay treatment, increase chances of dehydration. Recommended patient consider feeding tube placement.  Monitoring, evaluation, goals: Patient will tolerate increased calories and protein to minimize weight loss and promote adequate nutrition for healing.  Next visit: Follow-up to be schedule weekly.  **Disclaimer: This note was dictated with voice recognition software. Similar sounding words can inadvertently be transcribed and this note may contain transcription errors which may not have been corrected upon publication of note.**

## 2015-09-05 NOTE — Therapy (Signed)
[facility] [address] [place], [place], [address] Phone: [phone]   Fax:  [phone]  Physical Therapy Evaluation  Patient Details  Name: Randy Hanson MRN: [id number] Date of Birth: 31-Mar-1940 Referring Provider: Dr. Lonie Peak  Encounter Date: 09/05/2015      PT End of Session - 09/05/15 1144    Visit Number 1   Number of Visits 1   PT Start Time 0100   PT Stop Time 1024   PT Time Calculation (min) 564 min   Activity Tolerance Patient tolerated treatment well   Behavior During Therapy [facility] for tasks assessed/performed      Past Medical History  Diagnosis Date  . Hypertension   . Esophageal reflux   . Back pain   . Hypercholesteremia   . FHx: colonic polyps   . Hemorrhoids   . Hyperlipemia   . DJD (degenerative joint disease)   . BPH (benign prostatic hyperplasia)   . Posterior vitreous detachment, left eye     DR. KATHERINE HECKER  AUGUST 2011  . Kidney cysts     [facility] [place] [facility]  . Cancer of tonsil, palatine (HCC) 07/12/2015    Past Surgical History  Procedure Laterality Date  . Vasectomy  1975  . Cardiac catheterization      remote >15 years ago    There were no vitals filed for this visit.       Subjective Assessment - 09/05/15 1132    Subjective reports pain in legs, back, and head that he says are due to his age   Patient is accompained by: Family member  wife   Pertinent History submandibular right neck mass 2 years ago; biopsy 06/14/15 and diagnosed with squamous cell carcinoma   Patient Stated Goals get info from all clinic providers   Currently in Pain? Yes   Pain Score 5    Pain Location Leg  and back and head   Pain Orientation Right;Left   Pain Descriptors / Indicators Aching   Pain Type Chronic pain   Aggravating Factors  moving   Pain Relieving Factors not moving            [facility] PT Assessment - 09/05/15 1135    Assessment   Medical  Diagnosis squamous cell head & neck cancer   Referring Provider Dr. Lonie Peak   Precautions   Precautions Other (comment)   Precaution Comments cancer precautions   Restrictions   Weight Bearing Restrictions No   Balance Screen   Has the patient fallen in the past 6 months Yes   How many times? 1  chair collapsed when he sat down   Has the patient had a decrease in activity level because of a fear of falling?  No   Is the patient reluctant to leave their home because of a fear of falling?  No   Home Environment   Living Environment Private residence   Living Arrangements Spouse/significant other   Available Help at Discharge Family   Type of Home House   Home Access Stairs to enter   Home Layout Laundry or work area in basement   Prior Function   Level of Independence Independent   Vocation Full time employment  not working now, but hopes to return   [employer]   Leisure no exercise other than at work, where he walked 6-7,000 steps/day   Cognition   Overall Cognitive Status Within Functional Limits for tasks assessed  Observation/Other Assessments   Observations well looking gentleman accompanied by his wife   Functional Tests   Functional tests Sit to Stand   Sit to Stand   Comments 11 times in 30 seconds, just below average for age  mild SOB afterwards   Posture/Postural Control   Posture/Postural Control Postural limitations   Postural Limitations Rounded Shoulders;Forward head   ROM / Strength   AROM / PROM / Strength AROM   AROM   Overall AROM Comments neck extension limited 50%, but other neck  motions WFL; shoulder AROM WFL bilat.   Ambulation/Gait   Ambulation/Gait Yes   Ambulation/Gait Assistance 7: Independent           LYMPHEDEMA/ONCOLOGY QUESTIONNAIRE - 09-27-2015 1141    Type   Cancer Type head & neck squamous cell   Lymphedema Assessments   Lymphedema Assessments Head and Neck   Head and Neck   4 cm superior to sternal  notch around neck 44 cm   6 cm superior to sternal notch around neck 44.8 cm   8 cm superior to sternal notch around neck 47 cm                        PT Education - 27-Sep-2015 1143    Education provided Yes   Education Details neck ROM, posture, walking, Cure article on staying active, lymphedema and PT info   Person(s) Educated Patient;Spouse   Methods Explanation;Handout   Comprehension Verbalized understanding                 Head and Neck Clinic Goals - 09/27/15 1149    Patient will be able to verbalize understanding of a home exercise program for cervical range of motion, posture, and walking.    Status Achieved   Patient will be able to verbalize understanding of proper sitting and standing posture.    Status Achieved   Patient will be able to verbalize understanding of lymphedema risk and availability of treatment for this condition.    Status Achieved           Plan - 09/27/15 1144    Clinical Impression Statement Pleasant gentleman with head & neck cancer diagnosis who will undergo XRT so has significant risk for lymphedema; he has some areas of pain, poor posture, decreased neck ROM, and limited activity level.   Rehab Potential Good   PT Frequency One time visit   PT Treatment/Interventions Patient/family education   PT Next Visit Plan None at this time   Consulted and Agree with Plan of Care Patient      Patient will benefit from skilled therapeutic intervention in order to improve the following deficits and impairments:  Postural dysfunction, Decreased range of motion, Decreased activity tolerance  Visit Diagnosis: Abnormal posture - Plan: PT plan of care cert/re-cert  Other symptoms and signs involving the musculoskeletal system - Plan: PT plan of care cert/re-cert      G-Codes - 09/27/15 1149    Functional Assessment Tool Used clinical judgement   Functional Limitation Changing and maintaining body position    Changing and Maintaining Body Position Current Status (N8295) At least 1 percent but less than 20 percent impaired, limited or restricted   Changing and Maintaining Body Position Goal Status (A2130) At least 1 percent but less than 20 percent impaired, limited or restricted   Changing and Maintaining Body Position Discharge Status (Q6578) At least 1 percent but less than 20 percent impaired, limited or restricted  Problem List Patient Active Problem List   Diagnosis Date Noted  . Carcinoma of tonsillar fossa (HCC) 07/19/2015  . 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 Abnormal EKG 04/22/2014  . Essential hypertension 04/22/2014  . Hyperlipidemia 04/22/2014    Gwendlyon Zumbro 09/05/2015, 11:51 AM  [facility] [address] [place], [place], [address] Phone: [phone]   Fax:  [phone]  Name: RAMAJ HEMPHILL MRN: [id number] Date of Birth: 1940/09/21   Micheline Maze, PT 09/05/2015 11:52 AM

## 2015-09-05 NOTE — Progress Notes (Signed)
  Oncology Nurse Navigator Documentation  Navigator Location: [facility]-Med Onc (09/05/15 6294) Navigator Encounter Type: Clinic/MDC (09/05/15 7654)                   Met with Mr. Mccarley during H&N [place].  He was accompanied by his wife.   Arrived him to Nursing, provided verbal and written overview of [place], the clinicians who will be seeing him, encouraged him to ask questions during their time with him.  He was seen by Nutrition, SLP, PT and SW.  Spoke with him at end of [facility], addressed his questions. They understand I can be contacted with needs/concerns. He proceeded to [facility] for tmt.  Gayleen Orem, RN, BSN, [place] at [place] [phone]                             Time Spent with Patient: 75 (09/05/15 0835)

## 2015-09-05 NOTE — Progress Notes (Signed)
Head & Neck Multidisciplinary Clinic Clinical Social Work  Clinical Social Work met with patient and spouse at head & neck multidisciplinary clinic to offer support and assess for psychosocial needs.  [name] has no concerns at this time, shared he is not accustomed to "being sick" and this is a very new experience for him.  The patient shared he has had multiple chemotherapy treatments and feels he is tolerating well.  Clinical Social Work briefly discussed Clinical Social Work role and [employer] support programs/services.  Clinical Social Work encouraged patient to call with any additional questions or concerns.   Randy Hanson, MSW, LCSW, OSW-C Clinical Social Worker [facility] [phone]

## 2015-09-05 NOTE — Patient Instructions (Signed)
SWALLOWING EXERCISES Do these 6 of the 7 days per week until 6 months after your last radiation day, then 2-3 times per week afterwards  1. Effortful Swallows - Press your tongue against the roof of your mouth for 3 seconds, then squeeze          the muscles in your neck while you swallow your saliva or a sip of water - Repeat 15-20 times, 2-3 times a day, and use whenever you eat or drink  2. Masako Swallow - swallow with your tongue sticking out - Stick tongue out past your teeth and gently bite tongue with your teeth - Swallow, while holding your tongue with your teeth - Repeat 15-20 times, 2-3 times a day *use a wet spoon if your mouth gets dry*  3. Shaker Exercise - head lift - Lie flat on your back in your bed or on a couch without pillows - Raise your head and look at your feet - KEEP YOUR SHOULDERS DOWN - HOLD FOR 45-60 SECONDS, then lower your head back down - Repeat 3 times, 2-3 times a day  4. Mendelsohn Maneuver - "half swallow" exercise - Start to swallow, and keep your Adam's apple up by squeezing hard with the            muscles of the throat - Hold the squeeze for 5-7 seconds and then relax - Repeat 15-20 times, 2-3 times a day *use a wet spoon if your mouth gets dry*  5. Breath Hold - Say "HUH!" loudly, then hold your breath for 3 seconds at your voice box - Repeat 20 times, 2-3 times a day  6. Chin pushback - Open your mouth  - Place your fist UNDER your chin near your neck, and push back with your fist for 5 seconds - Repeat 10-15 times, 2-3 times a day

## 2015-09-05 NOTE — Therapy (Signed)
[facility] [address] [place], [place], [address] Phone: [phone]   Fax:  [phone]  Speech Language Pathology Evaluation  Patient Details  Name: Randy Hanson MRN: [id number] Date of Birth: 03-10-1941 Referring Provider: Lonie Peak MD  Encounter Date: 09/05/2015      End of Session - 09/05/15 0949    Visit Number 1   Number of Visits 3   Date for SLP Re-Evaluation 11/10/15   [place]ization Type [facility] - wife stated she got a paper saying ST would be covered   SLP Start Time 0915   SLP Stop Time  0950   SLP Time Calculation (min) 35 min   Activity Tolerance Patient tolerated treatment well      Past Medical History  Diagnosis Date  . Hypertension   . Esophageal reflux   . Back pain   . Hypercholesteremia   . FHx: colonic polyps   . Hemorrhoids   . Hyperlipemia   . DJD (degenerative joint disease)   . BPH (benign prostatic hyperplasia)   . Posterior vitreous detachment, left eye     DR. KATHERINE HECKER  AUGUST 2011  . Kidney cysts     [facility] [place] [facility]  . Cancer of tonsil, palatine (HCC) 07/12/2015    Past Surgical History  Procedure Laterality Date  . Vasectomy  1975  . Cardiac catheterization      remote >15 years ago    There were no vitals filed for this visit.      Subjective Assessment - 09/05/15 0916    Subjective Pt is receiving his rad here, chemo in [place].  Began rad tx on 08-28-15.             SLP Evaluation [facility] - 09/05/15 1607    SLP Visit Information   SLP Received On 09/05/15   Referring Provider Lonie Peak MD   Medical Diagnosis Base of Tongue cancer   General Information   HPI Pt c/o xerostomia. No coughing during meals.   Cognition   Overall Cognitive Status Within Functional Limits for tasks assessed   Auditory Comprehension   Overall Auditory Comprehension Appears within functional limits for tasks assessed   Verbal  Expression   Overall Verbal Expression Appears within functional limits for tasks assessed   Oral Motor/Sensory Function   Overall Oral Motor/Sensory Function Appears within functional limits for tasks assessed   Motor Speech   Overall Motor Speech Appears within functional limits for tasks assessed      Pt currently tolerates regular diet and thin liquids. Xerostomia necessitates constant liquid oral intake. Oral motor assessment revealed as above. POs: Pt ate ham sandwith and drank H2O without overt s/s aspiration. Thyroid elevation appeared adequate, and swallows appeared timely, although pt had extended mastication due to molar extraction. Oral residue noted as WNL/minimal. Pt's swallow deemed WFL/WNL at this time.   Because data states the risk for dysphagia during and after radiation treatment is high due to undergoing radiation tx, SLP taught pt about the possibility of reduced/limited ability for PO intake during rad tx. SLP encouraged pt to continue swallowing POs as far into rad tx as possible, even ingesting POs and/or completing HEP shortly after administration of pain meds.   SLP educated pt re: changes to swallowing musculature after rad tx, and why adherence to dysphagia HEP provided today and PO consumption was necessary to inhibit muscular disuse atrophy and to reduce muscle fibrosis following rad tx. Pt demonstrated  understanding of these things to SLP.    SLP then developed a HEP for pt and pt was instructed how to perform exercises involving lingual, vocal, and pharyngeal strengthening. SLP performed each exercise and pt return demonstrated each exercise. SLP ensured pt performance was correct prior to moving on to next exercise. Pt was instructed to complete this program 2-3  times a day, 6-7 days/week until 6 months after their last rad tx, then x2-3 a week after that.                     SLP Education - 09/05/15 [id number]    Education provided Yes   Education  Details HEP, late effects head/neck radiation on swallowing ability   Person(s) Educated Patient;Spouse   Methods Explanation;Demonstration;Verbal cues;Handout   Comprehension Need further instruction;Verbalized understanding;Returned demonstration              Plan - 09/05/15 0957    Clinical Impression Statement Pt presents with swallowing WNL/WFL today. However data suggests that pt's swallowing skills may deteriorate during and after radiation/chemotherapy, and risk of aspiration increases. Pt would benefit from cont'd ST to address both correct completion of HEP and assessment of safety of POs.   Speech Therapy Frequency --  approx every four weeks   Duration --  3 sessions in 60-90 days (Sept 1st)   Treatment/Interventions Aspiration precaution training;Pharyngeal strengthening exercises;Diet toleration management by SLP;Trials of upgraded texture/liquids;Compensatory techniques;Patient/family education;SLP instruction and feedback  any or all may be used in ST   Potential to Achieve Goals Good   SLP Home Exercise Plan provided today   Consulted and Agree with Plan of Care Patient      Patient will benefit from skilled therapeutic intervention in order to improve the following deficits and impairments:   No diagnosis found.    Problem List Patient Active Problem List   Diagnosis Date Noted  . Carcinoma of tonsillar fossa (HCC) 07/19/2015  . 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 Abnormal EKG 04/22/2014  . Essential hypertension 04/22/2014  . Hyperlipidemia 04/22/2014    [facility] 09/05/2015, 10:01 AM  [facility] [address] [place], [place], [address] Phone: [phone]   Fax:  [phone]  Name: Randy Hanson MRN: [id number] Date of Birth: Aug 23, 1940

## 2015-09-06 ENCOUNTER — Telehealth: Payer: Self-pay | Admitting: *Deleted

## 2015-09-06 ENCOUNTER — Ambulatory Visit
Admission: RE | Admit: 2015-09-06 | Discharge: 2015-09-06 | Disposition: A | Discharge status: Home / Self Care | Payer: Non-veteran care | Source: Ambulatory Visit | Attending: Radiation Oncology | Admitting: Radiation Oncology

## 2015-09-06 DIAGNOSIS — Z510 Encounter for antineoplastic radiation therapy: Secondary | ICD-10-CM | POA: Diagnosis not present

## 2015-09-06 NOTE — Progress Notes (Signed)
Weekly Management Note:  Outpatient    ICD-9-CM ICD-10-CM   1. Carcinoma of tonsillar fossa (HCC) 146.1 C09.0     Current Dose:  20 Gy  Projected Dose: 70 Gy     Narrative:  The patient presents for routine under treatment assessment.  CBCT/MVCT images/Port film x-rays were reviewed.  The chart was checked. The patient was in a hurry today in order to make it to an appointment with Medical Oncology in [facility], therefore vitals were deferred.  The patient complains of persistent throat pain.  Physical Findings:  Wt Readings from Last 3 Encounters:  09/05/15 227 lb 8 oz (103.193 kg)  09/04/15 229 lb 11.2 oz (104.191 kg)  08/28/15 238 lb 6.4 oz (108.138 kg)    vitals were not taken for this visit. NAD,  In oral cavity there is no obvious mucositis or thrush - skin intact over neck * Vitals were deferred today because the patient was in a hurry to make an appointment with MedOnc in [place]  CBC    Component Value Date/Time   WBC 10.2* 09/01/2015 0903   WBC 5.6 08/24/2015 0859   RBC 4.63 09/01/2015 0903   RBC 4.74 08/24/2015 0859   HGB 14.8 09/01/2015 0903   HGB 14.3 08/24/2015 0859   HCT 41.1 09/01/2015 0903   HCT 42.6 08/24/2015 0859   PLT 235 09/01/2015 0903   PLT 219 08/24/2015 0859   MCV 89 09/01/2015 0903   MCV 89.9 08/24/2015 0859   MCH 32.0 09/01/2015 0903   MCH 30.2 08/24/2015 0859   MCHC 36.0* 09/01/2015 0903   MCHC 33.6 08/24/2015 0859   RDW 12.4 09/01/2015 0903   RDW 12.9 08/24/2015 0859   LYMPHSABS 1.0 09/01/2015 0903   LYMPHSABS 1.5 08/24/2015 0859   MONOABS 0.4 08/24/2015 0859   EOSABS 0.2 09/01/2015 0903   EOSABS 0.3 08/24/2015 0859   BASOSABS 0.0 09/01/2015 0903   BASOSABS 0.0 08/24/2015 0859     CMP     Component Value Date/Time   NA 133 09/01/2015 0906   NA 136 08/18/2015 1503   NA 139 07/12/2015 1145   K 3.8 09/01/2015 0906   K 3.5 08/18/2015 1503   K 4.1 07/12/2015 1145   CL 98 09/01/2015 0906   CL 100 08/18/2015 1503   CO2 30  09/01/2015 0906   CO2 24 08/18/2015 1503   CO2 28 07/12/2015 1145   GLUCOSE 112 09/01/2015 0906   GLUCOSE 176* 08/18/2015 1503   GLUCOSE 98 07/12/2015 1145   BUN 20 09/01/2015 0906   BUN 19 08/18/2015 1503   BUN 19.8 07/12/2015 1145   CREATININE 0.9 09/01/2015 0906   CREATININE 0.97 08/18/2015 1503   CREATININE 1.1 07/12/2015 1145   CALCIUM 9.2 09/01/2015 0906   CALCIUM 9.7 08/18/2015 1503   CALCIUM 10.0 07/12/2015 1145   PROT 6.7 09/01/2015 0906   PROT 7.1 08/18/2015 1503   PROT 7.2 07/12/2015 1145   ALBUMIN 3.3 09/01/2015 0906   ALBUMIN 4.2 08/18/2015 1503   ALBUMIN 3.9 07/12/2015 1145   AST 25 09/01/2015 0906   AST 18 08/18/2015 1503   AST 20 07/12/2015 1145   ALT 30 09/01/2015 0906   ALT 14 08/18/2015 1503   ALT 16 07/12/2015 1145   ALKPHOS 63 09/01/2015 0906   ALKPHOS 71 08/18/2015 1503   ALKPHOS 61 07/12/2015 1145   BILITOT 0.80 09/01/2015 0906   BILITOT <0.2 08/18/2015 1503   BILITOT 0.47 07/12/2015 1145   GFRNONAA 78 08/18/2015 1503  GFRAA 90 08/18/2015 1503    Impression:  The patient is tolerating radiotherapy.    Plan:  Continue radiotherapy as planned. Advised to talk to Dr.Ennever regarding prescription of pain medications.  He has lidocaine to use for sore throat.  But, will defer to med/onc re: narcotic Rxs.  Pt advised to push PO intake.  PEG tube will be considered, if needed, but he declines that for now. I also suggested he use Miralax or Senokot daily to help against further constipation.  -----------------------------------  Eppie Gibson, MD  This document serves as a record of services personally performed by Eppie Gibson, MD. It was created on her behalf by Derek Mound, a trained medical scribe. The creation of this record is based on the scribe's personal observations and the provider's statements to them. This document has been checked and approved by the attending provider.

## 2015-09-06 NOTE — Telephone Encounter (Signed)
Patient's wife calling office states that patient is having difficulty with constipation. His last BM was 6/26.  He has tried laxatives, miralax and mag citrate.   Patient was taking Miralax "trying to take every day" Reviewed with wife that Miralax should be used BID until the patient has a comfortable stool, then reduce dosage to daily. He should continue this medication daily unless he develops diarrhea. She understood these instructions confirmed with teach back.   Spoke to Dr Marin Olp, in addition to the Miralax, he wants the patient to take Senakot S, two tablets BID. Also educated wife on this.

## 2015-09-07 ENCOUNTER — Ambulatory Visit
Admission: RE | Admit: 2015-09-07 | Discharge: 2015-09-07 | Disposition: A | Discharge status: Home / Self Care | Payer: Non-veteran care | Source: Ambulatory Visit | Attending: Radiation Oncology | Admitting: Radiation Oncology

## 2015-09-07 DIAGNOSIS — Z510 Encounter for antineoplastic radiation therapy: Secondary | ICD-10-CM | POA: Diagnosis not present

## 2015-09-08 ENCOUNTER — Ambulatory Visit (HOSPITAL_BASED_OUTPATIENT_CLINIC_OR_DEPARTMENT_OTHER): Payer: Medicare Other

## 2015-09-08 ENCOUNTER — Other Ambulatory Visit: Payer: Self-pay | Admitting: Hematology & Oncology

## 2015-09-08 ENCOUNTER — Ambulatory Visit
Admission: RE | Admit: 2015-09-08 | Discharge: 2015-09-08 | Disposition: A | Discharge status: Home / Self Care | Payer: No Typology Code available for payment source | Source: Ambulatory Visit | Attending: Radiation Oncology | Admitting: Radiation Oncology

## 2015-09-08 ENCOUNTER — Ambulatory Visit (HOSPITAL_BASED_OUTPATIENT_CLINIC_OR_DEPARTMENT_OTHER): Payer: Medicare Other | Admitting: Hematology & Oncology

## 2015-09-08 ENCOUNTER — Other Ambulatory Visit (HOSPITAL_BASED_OUTPATIENT_CLINIC_OR_DEPARTMENT_OTHER): Payer: Medicare Other

## 2015-09-08 ENCOUNTER — Other Ambulatory Visit: Payer: Self-pay | Admitting: Radiation Oncology

## 2015-09-08 ENCOUNTER — Ambulatory Visit
Admission: RE | Admit: 2015-09-08 | Discharge: 2015-09-08 | Disposition: A | Discharge status: Home / Self Care | Payer: Non-veteran care | Source: Ambulatory Visit | Attending: Radiation Oncology | Admitting: Radiation Oncology

## 2015-09-08 VITALS — BP 124/77 | HR 93 | Temp 98.4°F | Resp 20 | Wt 231.0 lb

## 2015-09-08 DIAGNOSIS — C099 Malignant neoplasm of tonsil, unspecified: Secondary | ICD-10-CM

## 2015-09-08 DIAGNOSIS — C090 Malignant neoplasm of tonsillar fossa: Secondary | ICD-10-CM

## 2015-09-08 DIAGNOSIS — Z5111 Encounter for antineoplastic chemotherapy: Secondary | ICD-10-CM

## 2015-09-08 DIAGNOSIS — Z510 Encounter for antineoplastic radiation therapy: Secondary | ICD-10-CM | POA: Diagnosis not present

## 2015-09-08 LAB — CBC WITH DIFFERENTIAL (CANCER CENTER ONLY)
BASO#: 0.1 10*3/uL (ref 0.0–0.2)
BASO%: 0.6 % (ref 0.0–2.0)
EOS%: 0.8 % (ref 0.0–7.0)
Eosinophils Absolute: 0.1 10*3/uL (ref 0.0–0.5)
HEMATOCRIT: 39.1 % (ref 38.7–49.9)
HGB: 14 g/dL (ref 13.0–17.1)
LYMPH#: 0.5 10*3/uL — AB (ref 0.9–3.3)
LYMPH%: 5.8 % — AB (ref 14.0–48.0)
MCH: 31.7 pg (ref 28.0–33.4)
MCHC: 35.8 g/dL (ref 32.0–35.9)
MCV: 89 fL (ref 82–98)
MONO#: 0.8 10*3/uL (ref 0.1–0.9)
MONO%: 9.5 % (ref 0.0–13.0)
NEUT#: 7.1 10*3/uL — ABNORMAL HIGH (ref 1.5–6.5)
NEUT%: 83.3 % — ABNORMAL HIGH (ref 40.0–80.0)
PLATELETS: 178 10*3/uL (ref 145–400)
RBC: 4.41 10*6/uL (ref 4.20–5.70)
RDW: 12.3 % (ref 11.1–15.7)
WBC: 8.5 10*3/uL (ref 4.0–10.0)

## 2015-09-08 LAB — CMP (CANCER CENTER ONLY)
ALK PHOS: 64 U/L (ref 26–84)
ALT: 29 U/L (ref 10–47)
AST: 22 U/L (ref 11–38)
Albumin: 3.3 g/dL (ref 3.3–5.5)
BILIRUBIN TOTAL: 0.7 mg/dL (ref 0.20–1.60)
BUN: 23 mg/dL — AB (ref 7–22)
CO2: 31 meq/L (ref 18–33)
CREATININE: 1.1 mg/dL (ref 0.6–1.2)
Calcium: 9.5 mg/dL (ref 8.0–10.3)
Chloride: 95 mEq/L — ABNORMAL LOW (ref 98–108)
GLUCOSE: 153 mg/dL — AB (ref 73–118)
Potassium: 3.8 mEq/L (ref 3.3–4.7)
Sodium: 131 mEq/L (ref 128–145)
TOTAL PROTEIN: 6.9 g/dL (ref 6.4–8.1)

## 2015-09-08 MED ORDER — PALONOSETRON HCL INJECTION 0.25 MG/5ML
INTRAVENOUS | Status: AC
  Filled 2015-09-08: qty 5

## 2015-09-08 MED ORDER — PALONOSETRON HCL INJECTION 0.25 MG/5ML
0.2500 mg | Freq: Once | INTRAVENOUS | Status: AC
  Administered 2015-09-08: 0.25 mg via INTRAVENOUS

## 2015-09-08 MED ORDER — SODIUM CHLORIDE 0.9 % IV SOLN
Freq: Once | INTRAVENOUS | Status: AC
  Administered 2015-09-08: 12:00:00 via INTRAVENOUS
  Filled 2015-09-08: qty 5

## 2015-09-08 MED ORDER — SODIUM CHLORIDE 0.9 % IV SOLN
40.0000 mg/m2 | Freq: Once | INTRAVENOUS | Status: AC
  Administered 2015-09-08: 94 mg via INTRAVENOUS
  Filled 2015-09-08: qty 94

## 2015-09-08 MED ORDER — HYDROMORPHONE HCL 1 MG/ML IJ SOLN: 1.0000 mg | INTRAMUSCULAR | Status: DC | PRN

## 2015-09-08 MED ORDER — SODIUM CHLORIDE 0.9 % IV SOLN
Freq: Once | INTRAVENOUS | Status: AC
  Administered 2015-09-08: 10:00:00 via INTRAVENOUS

## 2015-09-08 MED ORDER — SODIUM CHLORIDE 0.9% FLUSH
10.0000 mL | INTRAVENOUS | Status: DC | PRN
  Administered 2015-09-08: 10 mL
  Filled 2015-09-08: qty 10

## 2015-09-08 MED ORDER — HYDROCODONE-ACETAMINOPHEN 5-325 MG PO TABS: 1.0000 | ORAL_TABLET | Freq: Four times a day (QID) | ORAL | Status: DC | PRN

## 2015-09-08 MED ORDER — HEPARIN SOD (PORK) LOCK FLUSH 100 UNIT/ML IV SOLN
500.0000 [IU] | Freq: Once | INTRAVENOUS | Status: AC | PRN
  Administered 2015-09-08: 500 [IU]
  Filled 2015-09-08: qty 5

## 2015-09-08 MED ORDER — POTASSIUM CHLORIDE 2 MEQ/ML IV SOLN
Freq: Once | INTRAVENOUS | Status: AC
  Administered 2015-09-08: 10:00:00 via INTRAVENOUS
  Filled 2015-09-08: qty 10

## 2015-09-08 MED ORDER — ALTEPLASE 2 MG IJ SOLR
2.0000 mg | Freq: Once | INTRAMUSCULAR | Status: DC | PRN
  Filled 2015-09-08: qty 2

## 2015-09-08 MED ORDER — SODIUM CHLORIDE 0.9% FLUSH
3.0000 mL | INTRAVENOUS | Status: DC | PRN
  Filled 2015-09-08: qty 10

## 2015-09-08 MED ORDER — HEPARIN SOD (PORK) LOCK FLUSH 100 UNIT/ML IV SOLN
250.0000 [IU] | Freq: Once | INTRAVENOUS | Status: DC | PRN
  Filled 2015-09-08: qty 5

## 2015-09-08 NOTE — Progress Notes (Signed)
Pt voided 325cc urine prior to cisplatin order. dph

## 2015-09-08 NOTE — Progress Notes (Signed)
Assumed care of patient @ 1330; report received from Donnica Harris RN. 

## 2015-09-08 NOTE — Progress Notes (Signed)
Hematology and Oncology Follow Up Visit  Randy Hanson [id number] 1940/07/15 76 y.o. 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     Interim History:  Randy Hanson is back for follow-up. He is doing pretty well. He has had 2 weeks of radiation andtwo cycles of cis-platinum. He is tolerating this well so far. He is still able to eat what he wants. His mouth is somewhat dry.  He is having some more pain. This is no surprise. I gave him a prescription for Vicodin to try to help with this. He still able take pills. I told was given liquid if necessary. He is  rinsing his mouth about 8 times a day. He is using baking soda and water for this.   He has had another one with nausea. Compazine seems to help this.  He's had no leg swelling. He has had no rashes.  He's not noted any hearing difficulties.   Overall, his performance status is ECOG 1.  Medications:  Current outpatient prescriptions:  .  aspirin 81 MG tablet, Take 81 mg by mouth daily., Disp: , Rfl:  .  b complex vitamins tablet, Take 1 tablet by mouth daily., Disp: , Rfl:  .  dexamethasone (DECADRON) 4 MG tablet, Take 2 tablets by mouth once a day on the day after chemotherapy and then take 2 tablets two times a day for 2 days. Take with food., Disp: 30 tablet, Rfl: 1 .  hydrochlorothiazide (HYDRODIURIL) 25 MG tablet, Take 25 mg by mouth daily., Disp: , Rfl:  .  HYDROcodone-acetaminophen (NORCO/VICODIN) 5-325 MG tablet, Take 1-2 tablets by mouth every 6 (six) hours as needed for moderate pain ([facility], does not dose.). Reported on 08/28/2015, Disp: 90 tablet, Rfl: 0 .  lidocaine (XYLOCAINE) 2 % solution, Patient: Mix 1part 2% viscous lidocaine, 1part H20. Swish and/or swallow 10mL of this mixture, before meals and at bedtime, up to QID, Disp: 100 mL, Rfl: 5 .  lidocaine-prilocaine (EMLA) cream,  Apply to affected area as needed, Disp: 30 g, Rfl: 3 .  LORazepam (ATIVAN) 0.5 MG tablet, Take 1 tablet (0.5 mg total) by mouth every 6 (six) hours as needed (Nausea or vomiting)., Disp: 30 tablet, Rfl: 0 .  losartan (COZAAR) 50 MG tablet, Take 50 mg by mouth daily., Disp: , Rfl:  .  Omega-3 Fatty Acids (FISH OIL) 1000 MG CAPS, Take 1 capsule by mouth daily., Disp: , Rfl:  .  ondansetron (ZOFRAN) 8 MG tablet, Take 1 tablet (8 mg total) by mouth 2 (two) times daily as needed. Start on the third day after chemotherapy., Disp: 30 tablet, Rfl: 1 .  Pantothenic Acid 500 MG TABS, Take 1 tablet by mouth daily. Reported on 08/28/2015, Disp: , Rfl:  .  prochlorperazine (COMPAZINE) 10 MG tablet, Take 1 tablet (10 mg total) by mouth every 6 (six) hours as needed (Nausea or vomiting)., Disp: 30 tablet, Rfl: 1 .  ranitidine (ZANTAC) 150 MG capsule, Take 150 mg by mouth 2 (two) times daily., Disp: , Rfl:  .  sucralfate (CARAFATE) 1 g tablet, Dissolve 1 tablet in 10 mL H20 and swallow up to QID prn sore throat., Disp: 60 tablet, Rfl: 5 .  terazosin (HYTRIN) 2 MG capsule, Take 2 mg by mouth at bedtime., Disp: , Rfl:  .  Wound Dressings (SONAFINE), Apply 1  application topically 3 (three) times daily., Disp: , Rfl:  No current facility-administered medications for this visit.  Facility-Administered Medications Ordered in Other Visits:  .  alteplase (CATHFLO ACTIVASE) injection 2 mg, 2 mg, Intracatheter, Once PRN, [name], MD, 2 mg at 09/08/15 1439 .  heparin lock flush 100 unit/mL, 250 Units, Intracatheter, Once PRN, [name], MD, 250 Units at 09/08/15 1439 .  HYDROmorphone (DILAUDID) injection 1 mg, 1 mg, Intravenous, Q3H PRN, [name], MD, 1 mg at 09/08/15 1438 .  sodium chloride flush (NS) 0.9 % injection 10 mL, 10 mL, Intracatheter, PRN, [name], MD, 10 mL at 09/08/15 1351 .  sodium chloride flush (NS) 0.9 % injection 3 mL, 3 mL, Intravenous, PRN, [name], MD, 3 mL at  09/08/15 1439  Allergies:  Allergies  Allergen Reactions  . Lisinopril Cough    Past Medical History, Surgical history, Social history, and Family History were reviewed and updated.  Review of Systems: As above  Physical Exam:  vitals were not taken for this visit.  Wt Readings from Last 3 Encounters:  09/08/15 231 lb (104.781 kg)  09/05/15 227 lb 8 oz (103.193 kg)  09/04/15 229 lb 11.2 oz (104.191 kg)     Well-developed and well-nourished white male in no obvious distress. Head and neck exam shows no ocular or oral lesions. He has a healing biopsy scar in the right tonsillar fossa area and he does have some palpable right cervical lymph nodes. None measure larger than 2 cm. Thyroid is nonpalpable. Lungs are clear to percussion and ask rotation bilaterally. Cardiac exam regular rate and rhythm with no murmurs, rubs or bruits. Abdomen is soft. Has good bowel sounds. He is mildly obese. He has no slow-wave. There is no palpable hepatosplenomegaly. Back exam shows no tenderness of the spine, ribs or hips. Extremities shows no clubbing, cyanosis or edema. Neurological exam shows no focal neurological deficits. Skin exam shows no rashes, ecchymoses or petechia.  Lab Results  Component Value Date   WBC 8.5 09/08/2015   HGB 14.0 09/08/2015   HCT 39.1 09/08/2015   MCV 89 09/08/2015   PLT 178 09/08/2015     Chemistry      Component Value Date/Time   NA 131 09/08/2015 0850   NA 136 08/18/2015 1503   NA 139 07/12/2015 1145   K 3.8 09/08/2015 0850   K 3.5 08/18/2015 1503   K 4.1 07/12/2015 1145   CL 95* 09/08/2015 0850   CL 100 08/18/2015 1503   CO2 31 09/08/2015 0850   CO2 24 08/18/2015 1503   CO2 28 07/12/2015 1145   BUN 23* 09/08/2015 0850   BUN 19 08/18/2015 1503   BUN 19.8 07/12/2015 1145   CREATININE 1.1 09/08/2015 0850   CREATININE 0.97 08/18/2015 1503   CREATININE 1.1 07/12/2015 1145      Component Value Date/Time   CALCIUM 9.5 09/08/2015 0850   CALCIUM 9.7  08/18/2015 1503   CALCIUM 10.0 07/12/2015 1145   ALKPHOS 64 09/08/2015 0850   ALKPHOS 71 08/18/2015 1503   ALKPHOS 61 07/12/2015 1145   AST 22 09/08/2015 0850   AST 18 08/18/2015 1503   AST 20 07/12/2015 1145   ALT 29 09/08/2015 0850   ALT 14 08/18/2015 1503   ALT 16 07/12/2015 1145   BILITOT 0.70 09/08/2015 0850   BILITOT <0.2 08/18/2015 1503   BILITOT 0.47 07/12/2015 1145         Impression and Plan: Randy Hanson is  75 year old white male. He looks a lot younger. He is incredibly fit. He actually has a stage I-HPV+ - squamous cell carcinoma the right tonsil. The prognosis should be  incredibly good for him given that this is HPV positive.  I will go ahead with week #3 of cisplatin. He will get IV fluids with the cisplatin. I  think that this will help him feel a little better.   I told him to keep eating what he can. This is definitely helpful.  I suspect that he may start having some side effects in the next couple weeks.  His weight is down a little bit. We will have to watch this closely.  We will continue to follow him weekly. I'll plan to see him back in one week.   [name], MD 6/30/20173:36 PM

## 2015-09-08 NOTE — Patient Instructions (Signed)
Cisplatin injection What is this medicine? CISPLATIN (SIS pla tin) is a chemotherapy drug. It targets fast dividing cells, like cancer cells, and causes these cells to die. This medicine is used to treat many types of cancer like bladder, ovarian, and testicular cancers. This medicine may be used for other purposes; ask your health care provider or pharmacist if you have questions. What should I tell my health care provider before I take this medicine? They need to know if you have any of these conditions: -blood disorders -hearing problems -kidney disease -recent or ongoing radiation therapy -an unusual or allergic reaction to cisplatin, carboplatin, other chemotherapy, other medicines, foods, dyes, or preservatives -pregnant or trying to get pregnant -breast-feeding How should I use this medicine? This drug is given as an infusion into a vein. It is administered in a hospital or clinic by a specially trained health care professional. Talk to your pediatrician regarding the use of this medicine in children. Special care may be needed. Overdosage: If you think you have taken too much of this medicine contact a poison control center or emergency room at once. NOTE: This medicine is only for you. Do not share this medicine with others. What if I miss a dose? It is important not to miss a dose. Call your doctor or health care professional if you are unable to keep an appointment. What may interact with this medicine? -dofetilide -foscarnet -medicines for seizures -medicines to increase blood counts like filgrastim, pegfilgrastim, sargramostim -probenecid -pyridoxine used with altretamine -rituximab -some antibiotics like amikacin, gentamicin, neomycin, polymyxin B, streptomycin, tobramycin -sulfinpyrazone -vaccines -zalcitabine Talk to your doctor or health care professional before taking any of these medicines: -acetaminophen -aspirin -ibuprofen -ketoprofen -naproxen This list may  not describe all possible interactions. Give your health care provider a list of all the medicines, herbs, non-prescription drugs, or dietary supplements you use. Also tell them if you smoke, drink alcohol, or use illegal drugs. Some items may interact with your medicine. What should I watch for while using this medicine? Your condition will be monitored carefully while you are receiving this medicine. You will need important blood work done while you are taking this medicine. This drug may make you feel generally unwell. This is not uncommon, as chemotherapy can affect healthy cells as well as cancer cells. Report any side effects. Continue your course of treatment even though you feel ill unless your doctor tells you to stop. In some cases, you may be given additional medicines to help with side effects. Follow all directions for their use. Call your doctor or health care professional for advice if you get a fever, chills or sore throat, or other symptoms of a cold or flu. Do not treat yourself. This drug decreases your body's ability to fight infections. Try to avoid being around people who are sick. This medicine may increase your risk to bruise or bleed. Call your doctor or health care professional if you notice any unusual bleeding. Be careful brushing and flossing your teeth or using a toothpick because you may get an infection or bleed more easily. If you have any dental work done, tell your dentist you are receiving this medicine. Avoid taking products that contain aspirin, acetaminophen, ibuprofen, naproxen, or ketoprofen unless instructed by your doctor. These medicines may hide a fever. Do not become pregnant while taking this medicine. Women should inform their doctor if they wish to become pregnant or think they might be pregnant. There is a potential for serious side effects to   an unborn child. Talk to your health care professional or pharmacist for more information. Do not breast-feed an  infant while taking this medicine. Drink fluids as directed while you are taking this medicine. This will help protect your kidneys. Call your doctor or health care professional if you get diarrhea. Do not treat yourself. What side effects may I notice from receiving this medicine? Side effects that you should report to your doctor or health care professional as soon as possible: -allergic reactions like skin rash, itching or hives, swelling of the face, lips, or tongue -signs of infection - fever or chills, cough, sore throat, pain or difficulty passing urine -signs of decreased platelets or bleeding - bruising, pinpoint red spots on the skin, black, tarry stools, nosebleeds -signs of decreased red blood cells - unusually weak or tired, fainting spells, lightheadedness -breathing problems -changes in hearing -gout pain -low blood counts - This drug may decrease the number of white blood cells, red blood cells and platelets. You may be at increased risk for infections and bleeding. -nausea and vomiting -pain, swelling, redness or irritation at the injection site -pain, tingling, numbness in the hands or feet -problems with balance, movement -trouble passing urine or change in the amount of urine Side effects that usually do not require medical attention (report to your doctor or health care professional if they continue or are bothersome): -changes in vision -loss of appetite -metallic taste in the mouth or changes in taste This list may not describe all possible side effects. Call your doctor for medical advice about side effects. You may report side effects to FDA at 1-800-FDA-1088. Where should I keep my medicine? This drug is given in a hospital or clinic and will not be stored at home. NOTE: This sheet is a summary. It may not cover all possible information. If you have questions about this medicine, talk to your doctor, pharmacist, or health care provider.    2016, [employer]  Standard. (2007-06-02 14:40:54)  

## 2015-09-11 ENCOUNTER — Ambulatory Visit: Payer: No Typology Code available for payment source | Admitting: Hematology & Oncology

## 2015-09-11 ENCOUNTER — Ambulatory Visit
Admission: RE | Admit: 2015-09-11 | Discharge: 2015-09-11 | Disposition: A | Discharge status: Home / Self Care | Payer: Non-veteran care | Source: Ambulatory Visit | Attending: Radiation Oncology | Admitting: Radiation Oncology

## 2015-09-11 DIAGNOSIS — Z510 Encounter for antineoplastic radiation therapy: Secondary | ICD-10-CM | POA: Diagnosis not present

## 2015-09-13 ENCOUNTER — Ambulatory Visit
Admission: RE | Admit: 2015-09-13 | Discharge: 2015-09-13 | Disposition: A | Discharge status: Home / Self Care | Payer: Non-veteran care | Source: Ambulatory Visit | Attending: Radiation Oncology | Admitting: Radiation Oncology

## 2015-09-13 DIAGNOSIS — Z510 Encounter for antineoplastic radiation therapy: Secondary | ICD-10-CM | POA: Diagnosis not present

## 2015-09-14 ENCOUNTER — Ambulatory Visit: Payer: Medicare Other | Admitting: Nutrition

## 2015-09-14 ENCOUNTER — Encounter: Payer: No Typology Code available for payment source | Admitting: Nutrition

## 2015-09-14 ENCOUNTER — Ambulatory Visit
Admission: RE | Admit: 2015-09-14 | Discharge: 2015-09-14 | Disposition: A | Discharge status: Home / Self Care | Payer: Non-veteran care | Source: Ambulatory Visit | Attending: Radiation Oncology | Admitting: Radiation Oncology

## 2015-09-14 ENCOUNTER — Encounter: Payer: Self-pay | Admitting: *Deleted

## 2015-09-14 ENCOUNTER — Other Ambulatory Visit: Payer: Self-pay | Admitting: Oncology

## 2015-09-14 DIAGNOSIS — C099 Malignant neoplasm of tonsil, unspecified: Secondary | ICD-10-CM

## 2015-09-14 DIAGNOSIS — Z510 Encounter for antineoplastic radiation therapy: Secondary | ICD-10-CM | POA: Diagnosis not present

## 2015-09-14 NOTE — Progress Notes (Signed)
Nutrition follow-up completed with patient and wife status post radiation for tonsil cancer. He is status post two chemotherapy sessions with some nausea. He receives daily radiation therapy and reports his throat is becoming very sore and thick saliva is getting worse. Dietary recall reveals patient is eating broth-based soups, potatoes, and boost. Reports food tastes awful but he is forcing himself to eat. Patient does not consume very much water but is trying. Weight has decreased was documented as 220.8 pounds today, down 10 pounds since June 30 when he weighed 231 pounds. Patient reports constipation but M.D. is adjusting medications.  Estimated nutrition needs: 2400-2600 calories, 125-135 grams protein, 2.6 L fluid.  Nutrition diagnosis:  Predicted suboptimal energy intake has evolved into inadequate oral intake related to tonsil cancer and associated treatments as evidenced by 11 pound weight loss.  Patient meets criteria for severe malnutrition related to 4% weight loss in one week and less than 50% energy intake for greater than 5 days.  Intervention: Educated patient to increase boost plus or Ensure Plus or equivalent to 6 bottles daily.  This will provide 2160 cal, 84 g protein, 1110 mL free water Encouraged patient to try pured foods and focus on more protein containing foods. Educated patient on strategies for improving nausea and dry mouth. Provided fact sheets. Stressed the importance of increased hydration. Recommended patient consider feeding tube placement secondary for increased risk for continued inadequate oral intake and dehydration. Questions were answered.  Teach back method was used.  Monitoring, evaluation, goals: Patient will work to increase oral intake to minimize further weight loss.  Next visit: Thursday, July 13 after radiation therapy.  **Disclaimer: This note was dictated with voice recognition software. Similar sounding words can inadvertently be  transcribed and this note may contain transcription errors which may not have been corrected upon publication of note.**

## 2015-09-14 NOTE — Progress Notes (Addendum)
  Oncology Nurse Navigator Documentation  Navigator Location: [facility]Med [facility]c (09/14/15 1120) Navigator Encounter Type: Treatment (09/14/15 1120)           Patient Visit Type: RadOnc (09/14/15 1120) Treatment Phase: Treatment (09/14/15 1120)       Met with Randy Hanson prior to his Tomo treatment to check on his well-being.  He was accompanied by his wife. He reported:  Tolerating chemotherapy without difficulty, "radiation is much worse".  Increasing throat soreness, using lidocaine and Carafate to sooth throat.  Increasing thickened saliva, using salt water/baking soda rinse regularly during day.  Applying Sonafine daily.  I encouraged BID/TID application.  Lack of taste, reduced energy.  We discussed these will gradually return following end of treatment.  Drinking a couple of Boost daily.  Weight loss.  I confirmed he was seeing Dory Peru, Nutritionist, following treatment, escorted him to this appointment. He denied needs/concerns at this time.   I provided them notes of support and encouragement written by a previous a patient and caregiver who attended [facility]. They understand I can be contacted is needed.  Gayleen Orem, RN, BSN, [facility] [place] [phone]                          Time Spent with Patient: 30 (09/14/15 1120)

## 2015-09-15 ENCOUNTER — Other Ambulatory Visit: Payer: Medicare Other

## 2015-09-15 ENCOUNTER — Ambulatory Visit
Admission: RE | Admit: 2015-09-15 | Discharge: 2015-09-15 | Disposition: A | Discharge status: Home / Self Care | Payer: Non-veteran care | Source: Ambulatory Visit | Attending: Radiation Oncology | Admitting: Radiation Oncology

## 2015-09-15 ENCOUNTER — Ambulatory Visit
Admission: RE | Admit: 2015-09-15 | Discharge: 2015-09-15 | Disposition: A | Discharge status: Home / Self Care | Payer: Medicare Other | Source: Ambulatory Visit | Attending: Radiation Oncology | Admitting: Radiation Oncology

## 2015-09-15 ENCOUNTER — Other Ambulatory Visit: Payer: Self-pay | Admitting: Hematology & Oncology

## 2015-09-15 ENCOUNTER — Other Ambulatory Visit: Payer: No Typology Code available for payment source

## 2015-09-15 ENCOUNTER — Ambulatory Visit (HOSPITAL_BASED_OUTPATIENT_CLINIC_OR_DEPARTMENT_OTHER): Payer: Medicare Other | Admitting: Family

## 2015-09-15 ENCOUNTER — Other Ambulatory Visit: Payer: Self-pay

## 2015-09-15 ENCOUNTER — Ambulatory Visit: Payer: No Typology Code available for payment source | Admitting: Hematology & Oncology

## 2015-09-15 ENCOUNTER — Ambulatory Visit (HOSPITAL_BASED_OUTPATIENT_CLINIC_OR_DEPARTMENT_OTHER): Payer: Medicare Other

## 2015-09-15 ENCOUNTER — Ambulatory Visit: Payer: Medicare Other

## 2015-09-15 ENCOUNTER — Ambulatory Visit: Payer: No Typology Code available for payment source

## 2015-09-15 VITALS — BP 129/77 | HR 86 | Temp 98.1°F | Resp 16 | Ht 72.0 in | Wt 221.0 lb

## 2015-09-15 VITALS — Wt 222.0 lb

## 2015-09-15 DIAGNOSIS — C090 Malignant neoplasm of tonsillar fossa: Secondary | ICD-10-CM

## 2015-09-15 DIAGNOSIS — M542 Cervicalgia: Secondary | ICD-10-CM | POA: Diagnosis not present

## 2015-09-15 DIAGNOSIS — Z5111 Encounter for antineoplastic chemotherapy: Secondary | ICD-10-CM | POA: Diagnosis not present

## 2015-09-15 DIAGNOSIS — C099 Malignant neoplasm of tonsil, unspecified: Secondary | ICD-10-CM

## 2015-09-15 DIAGNOSIS — Z510 Encounter for antineoplastic radiation therapy: Secondary | ICD-10-CM | POA: Diagnosis not present

## 2015-09-15 DIAGNOSIS — R070 Pain in throat: Secondary | ICD-10-CM

## 2015-09-15 LAB — CBC WITH DIFFERENTIAL (CANCER CENTER ONLY)
BASO#: 0 10*3/uL (ref 0.0–0.2)
BASO%: 0.4 % (ref 0.0–2.0)
EOS%: 0.4 % (ref 0.0–7.0)
Eosinophils Absolute: 0 10*3/uL (ref 0.0–0.5)
HEMATOCRIT: 38.4 % — AB (ref 38.7–49.9)
HGB: 13.9 g/dL (ref 13.0–17.1)
LYMPH#: 0.4 10*3/uL — AB (ref 0.9–3.3)
LYMPH%: 6.1 % — AB (ref 14.0–48.0)
MCH: 31.9 pg (ref 28.0–33.4)
MCHC: 36.2 g/dL — AB (ref 32.0–35.9)
MCV: 88 fL (ref 82–98)
MONO#: 0.7 10*3/uL (ref 0.1–0.9)
MONO%: 9.2 % (ref 0.0–13.0)
NEUT#: 6 10*3/uL (ref 1.5–6.5)
NEUT%: 83.9 % — AB (ref 40.0–80.0)
PLATELETS: 205 10*3/uL (ref 145–400)
RBC: 4.36 10*6/uL (ref 4.20–5.70)
RDW: 12.2 % (ref 11.1–15.7)
WBC: 7.2 10*3/uL (ref 4.0–10.0)

## 2015-09-15 LAB — CMP (CANCER CENTER ONLY)
ALK PHOS: 65 U/L (ref 26–84)
ALT: 28 U/L (ref 10–47)
AST: 21 U/L (ref 11–38)
Albumin: 3.4 g/dL (ref 3.3–5.5)
BILIRUBIN TOTAL: 0.7 mg/dL (ref 0.20–1.60)
BUN: 24 mg/dL — AB (ref 7–22)
CALCIUM: 9.7 mg/dL (ref 8.0–10.3)
CO2: 30 meq/L (ref 18–33)
Chloride: 92 mEq/L — ABNORMAL LOW (ref 98–108)
Creat: 1 mg/dl (ref 0.6–1.2)
GLUCOSE: 132 mg/dL — AB (ref 73–118)
Potassium: 3.6 mEq/L (ref 3.3–4.7)
SODIUM: 130 meq/L (ref 128–145)
Total Protein: 7.2 g/dL (ref 6.4–8.1)

## 2015-09-15 MED ORDER — HEPARIN SOD (PORK) LOCK FLUSH 100 UNIT/ML IV SOLN
500.0000 [IU] | Freq: Once | INTRAVENOUS | Status: AC | PRN
  Administered 2015-09-15: 500 [IU]
  Filled 2015-09-15: qty 5

## 2015-09-15 MED ORDER — POTASSIUM CHLORIDE 2 MEQ/ML IV SOLN
Freq: Once | INTRAVENOUS | Status: AC
  Administered 2015-09-15: 11:00:00 via INTRAVENOUS
  Filled 2015-09-15: qty 10

## 2015-09-15 MED ORDER — SODIUM CHLORIDE 0.9 % IV SOLN
40.0000 mg/m2 | Freq: Once | INTRAVENOUS | Status: AC
  Administered 2015-09-15: 94 mg via INTRAVENOUS
  Filled 2015-09-15: qty 94

## 2015-09-15 MED ORDER — SODIUM CHLORIDE 0.9 % IV SOLN
Freq: Once | INTRAVENOUS | Status: AC
  Administered 2015-09-15: 10:00:00 via INTRAVENOUS

## 2015-09-15 MED ORDER — SODIUM CHLORIDE 0.9 % IV SOLN
Freq: Once | INTRAVENOUS | Status: AC
  Administered 2015-09-15: 13:00:00 via INTRAVENOUS
  Filled 2015-09-15: qty 5

## 2015-09-15 MED ORDER — SODIUM CHLORIDE 0.9% FLUSH
10.0000 mL | INTRAVENOUS | Status: DC | PRN
  Administered 2015-09-15: 10 mL
  Filled 2015-09-15: qty 10

## 2015-09-15 MED ORDER — PALONOSETRON HCL INJECTION 0.25 MG/5ML
0.2500 mg | Freq: Once | INTRAVENOUS | Status: AC
  Administered 2015-09-15: 0.25 mg via INTRAVENOUS

## 2015-09-15 MED ORDER — PALONOSETRON HCL INJECTION 0.25 MG/5ML
INTRAVENOUS | Status: AC
  Filled 2015-09-15: qty 5

## 2015-09-15 MED ORDER — OXYCODONE HCL 5 MG/5ML PO SOLN: 7.5000 mg | Freq: Four times a day (QID) | ORAL | Status: DC | PRN

## 2015-09-15 MED FILL — oxyCODONE HCL 5 MG/5ML SOLN: 5 | 8 days supply | Qty: 240 | Fill #0

## 2015-09-15 NOTE — Progress Notes (Signed)
Weekly Management Note:  Outpatient    ICD-9-CM ICD-10-CM   1. Carcinoma of tonsillar fossa (HCC) 146.1 C09.0     Current Dose:  28 Gy  Projected Dose: 70 Gy      Narrative:  The patient presents for routine under treatment assessment.  CBCT/MVCT images/Port film x-rays were reviewed.  The chart was checked. The patient was in a hurry today in order to make it to an appointment with Medical Oncology in [facility], therefore full vitals were deferred.  The patient complains of persistent throat pain that is helped by carafate. He believe he needs more pain meds than the hydrocodone he's receive from Dr Marin Olp.   Taking nutritional supplements, liquids by mouth. Following with Dory Peru closely. 16 lb loss since 6-19.  Nausea waxes and wanes  Physical Findings:  Wt Readings from Last 3 Encounters:  09/15/15 221 lb (100.245 kg)  09/15/15 222 lb (100.699 kg)  09/14/15 220 lb 12.8 oz (100.154 kg)    weight is 222 lb (100.699 kg).  NAD,  In oral cavity there is thick saliva, early mucositis and no thrush - skin intact over neck, no obvious masses. * Vitals were deferred today because the patient was in a hurry to make an appointment with MedOnc in [place]  CBC    Component Value Date/Time   WBC 7.2 09/15/2015 0926   WBC 5.6 08/24/2015 0859   RBC 4.36 09/15/2015 0926   RBC 4.74 08/24/2015 0859   HGB 13.9 09/15/2015 0926   HGB 14.3 08/24/2015 0859   HCT 38.4* 09/15/2015 0926   HCT 42.6 08/24/2015 0859   PLT 205 09/15/2015 0926   PLT 219 08/24/2015 0859   MCV 88 09/15/2015 0926   MCV 89.9 08/24/2015 0859   MCH 31.9 09/15/2015 0926   MCH 30.2 08/24/2015 0859   MCHC 36.2* 09/15/2015 0926   MCHC 33.6 08/24/2015 0859   RDW 12.2 09/15/2015 0926   RDW 12.9 08/24/2015 0859   LYMPHSABS 0.4* 09/15/2015 0926   LYMPHSABS 1.5 08/24/2015 0859   MONOABS 0.4 08/24/2015 0859   EOSABS 0.0 09/15/2015 0926   EOSABS 0.3 08/24/2015 0859   BASOSABS 0.0 09/15/2015 0926   BASOSABS 0.0  08/24/2015 0859     CMP     Component Value Date/Time   NA 130 09/15/2015 0926   NA 136 08/18/2015 1503   NA 139 07/12/2015 1145   K 3.6 09/15/2015 0926   K 3.5 08/18/2015 1503   K 4.1 07/12/2015 1145   CL 92* 09/15/2015 0926   CL 100 08/18/2015 1503   CO2 30 09/15/2015 0926   CO2 24 08/18/2015 1503   CO2 28 07/12/2015 1145   GLUCOSE 132* 09/15/2015 0926   GLUCOSE 176* 08/18/2015 1503   GLUCOSE 98 07/12/2015 1145   BUN 24* 09/15/2015 0926   BUN 19 08/18/2015 1503   BUN 19.8 07/12/2015 1145   CREATININE 1.0 09/15/2015 0926   CREATININE 0.97 08/18/2015 1503   CREATININE 1.1 07/12/2015 1145   CALCIUM 9.7 09/15/2015 0926   CALCIUM 9.7 08/18/2015 1503   CALCIUM 10.0 07/12/2015 1145   PROT 7.2 09/15/2015 0926   PROT 7.1 08/18/2015 1503   PROT 7.2 07/12/2015 1145   ALBUMIN 3.4 09/15/2015 0926   ALBUMIN 4.2 08/18/2015 1503   ALBUMIN 3.9 07/12/2015 1145   AST 21 09/15/2015 0926   AST 18 08/18/2015 1503   AST 20 07/12/2015 1145   ALT 28 09/15/2015 0926   ALT 14 08/18/2015 1503   ALT  16 07/12/2015 1145   ALKPHOS 65 09/15/2015 0926   ALKPHOS 71 08/18/2015 1503   ALKPHOS 61 07/12/2015 1145   BILITOT 0.70 09/15/2015 0926   BILITOT <0.2 08/18/2015 1503   BILITOT 0.47 07/12/2015 1145   GFRNONAA 78 08/18/2015 1503   GFRAA 90 08/18/2015 1503    Impression:  The patient is tolerating radiotherapy.     Plan:  Continue radiotherapy as planned. Advised to talk to Dr.Ennever regarding possible escalation of prescriptions of pain medications. He has antiemetics, advised him to discuss with Dr Marin Olp how to time this ideally. WE talked about possibly taking one of these around the clock. He has carafate and lidocaine to use for sore throat in addition to pain meds.    Pt advised to push PO intake.  PEG tube will be considered, if needed,and we'll continue to monitor him for this.  -----------------------------------  Eppie Gibson, MD   This document serves as a record of services  personally performed by Eppie Gibson , MD. It was created on his behalf by Truddie Hidden, a trained medical scribe. The creation of this record is based on the scribe's personal observations and the provider's statements to them. This document has been checked and approved by the attending provider.

## 2015-09-15 NOTE — Patient Instructions (Signed)
[facility] Discharge Instructions for Patients Receiving Chemotherapy  Today you received the following chemotherapy agents Cisplatin  To help prevent nausea and vomiting after your treatment, we encourage you to take your nausea medication   If you develop nausea and vomiting that is not controlled by your nausea medication, call the clinic.   BELOW ARE SYMPTOMS THAT SHOULD BE REPORTED IMMEDIATELY:  *FEVER GREATER THAN 100.5 F  *CHILLS WITH OR WITHOUT FEVER  NAUSEA AND VOMITING THAT IS NOT CONTROLLED WITH YOUR NAUSEA MEDICATION  *UNUSUAL SHORTNESS OF BREATH  *UNUSUAL BRUISING OR BLEEDING  TENDERNESS IN MOUTH AND THROAT WITH OR WITHOUT PRESENCE OF ULCERS  *URINARY PROBLEMS  *BOWEL PROBLEMS  UNUSUAL RASH Items with * indicate a potential emergency and should be followed up as soon as possible.  Feel free to call the clinic you have any questions or concerns. The clinic phone number is [phone].  Please show the CHEMO ALERT CARD at check-in to the Emergency Department and triage nurse.   

## 2015-09-15 NOTE — Progress Notes (Signed)
Hematology and Oncology Follow Up Visit  Randy Hanson [id number] [birth year] 75 y.o. 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 He is having some pain in the right side of his neck from radiation despite taking Norco and tylenol. We will discontinue the Norco and try him in Oxycodone elixir.  He denies having difficulty swallowing. He states that everything "tastes bad" and that he has no appetite. He is supplementing some with Boost. His weight is stable at 221. He is drinking some fluids but does not feel that it is enough. His chloride today is low at 92. We will give him fluids today and also see if he could start receiving fluids on Tuesdays while at radiation.  He is using Carafate and lidocaine mouth rinse to help with sore throat and dry mouth. He feels that these are working.  He states that he is constipated at this time. His last BM was on Tuesday. His abdomen was soft on exam and he has bowel sounds in all 4 quadrants. He would prefer to try otc stool softeners and Mirilax before trying anything prescription at this time. He will contact us on Monday if he still has not had a BM. No fever, chills, n/v, cough, rash, dizziness, SOB, chest pain, palpitations, or changes in bladder habits.  No swelling, tenderness, numbness or tingling in his extremities. No new aches or pains.    Medications:    Medication List       This list is accurate as of: 09/15/15 10:13 AM.  Always use your most recent med list.               aspirin 81 MG tablet  Take 81 mg by mouth daily.     b complex vitamins tablet  Take 1 tablet by mouth daily.     dexamethasone 4 MG tablet  Commonly known as:  DECADRON  Take 2 tablets by mouth once a day on the day after  chemotherapy and then take 2 tablets two times a day for 2 days. Take with food.     Fish Oil 1000 MG Caps  Take 1 capsule by mouth daily.     hydrochlorothiazide 25 MG tablet  Commonly known as:  HYDRODIURIL  Take 25 mg by mouth daily.     HYDROcodone-acetaminophen 5-325 MG tablet  Commonly known as:  NORCO/VICODIN  Take 1-2 tablets by mouth every 6 (six) hours as needed for moderate pain ([facility], does not dose.). Reported on 08/28/2015     lidocaine 2 % solution  Commonly known as:  XYLOCAINE  Patient: Mix 1part 2% viscous lidocaine, 1part H20. Swish and/or swallow 10mL of this mixture, before meals and at bedtime, up to QID     lidocaine-prilocaine cream  Commonly known as:  EMLA  Apply to affected area as needed     LORazepam 0.5 MG tablet  Commonly known as:  ATIVAN  Take 1 tablet (0.5 mg total) by mouth every 6 (six) hours as needed (Nausea or vomiting).     losartan 50 MG tablet  Commonly known as:  COZAAR  Take 50 mg by mouth daily.     ondansetron 8 MG tablet  Commonly known as:  ZOFRAN  Take 1 tablet (8 mg total) by mouth 2 (two) times daily as needed. Start on the third day after chemotherapy.     Pantothenic Acid 500 MG Tabs  Take 1 tablet by mouth daily. Reported on 08/28/2015     prochlorperazine 10 MG tablet  Commonly known as:  COMPAZINE  Take 1 tablet (10 mg total) by mouth every 6 (six) hours as needed (Nausea or vomiting).     ranitidine 150 MG capsule  Commonly known as:  ZANTAC  Take 150 mg by mouth 2 (two) times daily.     SONAFINE  Apply 1 application topically 3 (three) times daily.     sucralfate 1 g tablet  Commonly known as:  CARAFATE  Dissolve 1 tablet in 10 mL H20 and swallow up to QID prn sore throat.     terazosin 2 MG capsule  Commonly known as:  HYTRIN  Take 2 mg by mouth at bedtime.        Allergies:  Allergies  Allergen Reactions  . Lisinopril Cough    Past Medical History, Surgical history, Social  history, and Family History were reviewed and updated.  Review of Systems: All other 10 point review of systems is negative.   Physical Exam:  height is 6' (1.829 m) and weight is 221 lb (100.245 kg). His oral temperature is 98.1 F (36.7 C). His blood pressure is 129/77 and his pulse is 86. His respiration is 16.   Wt Readings from Last 3 Encounters:  09/15/15 221 lb (100.245 kg)  09/14/15 220 lb 12.8 oz (100.154 kg)  09/08/15 231 lb (104.781 kg)    Ocular: Sclerae unicteric, pupils equal, round and reactive to light Ear-nose-throat: Oropharynx clear, dentition fair Lymphatic: No cervical supraclavicular or axillary adenopathy Lungs no rales or rhonchi, good excursion bilaterally Heart regular rate and rhythm, no murmur appreciated Abd soft, nontender, positive bowel sounds, no liver or spleen tip palpated on exam, no fluid wave MSK no focal spinal tenderness, no joint edema Neuro: non-focal, well-oriented, appropriate affect Breasts: Deferred  Lab Results  Component Value Date   WBC 7.2 09/15/2015   HGB 13.9 09/15/2015   HCT 38.4* 09/15/2015   MCV 88 09/15/2015   PLT 205 09/15/2015   No results found for: FERRITIN, IRON, TIBC, UIBC, IRONPCTSAT Lab Results  Component Value Date   RBC 4.36 09/15/2015   No results found for: KPAFRELGTCHN, LAMBDASER, KAPLAMBRATIO No results found for: Loel Lofty, IGMSERUM No results found for: Marda Stalker, SPEI   Chemistry      Component Value Date/Time   NA 130 09/15/2015 0926   NA 136 08/18/2015 1503   NA 139 07/12/2015 1145   K 3.6 09/15/2015 0926   K 3.5 08/18/2015 1503   K 4.1 07/12/2015 1145   CL 92* 09/15/2015 0926   CL 100 08/18/2015 1503   CO2 30 09/15/2015 0926   CO2 24 08/18/2015 1503   CO2 28 07/12/2015 1145   BUN 24* 09/15/2015 0926   BUN 19 08/18/2015 1503   BUN 19.8 07/12/2015 1145   CREATININE 1.0 09/15/2015 0926   CREATININE 0.97 08/18/2015 1503    CREATININE 1.1 07/12/2015 1145      Component Value Date/Time   CALCIUM 9.7 09/15/2015 0926   CALCIUM 9.7 08/18/2015 1503   CALCIUM 10.0 07/12/2015 1145   ALKPHOS 65 09/15/2015 0926   ALKPHOS 71 08/18/2015 1503   ALKPHOS 61 07/12/2015 1145   AST 21 09/15/2015 0926  AST 18 08/18/2015 1503   AST 20 07/12/2015 1145   ALT 28 09/15/2015 0926   ALT 14 08/18/2015 1503   ALT 16 07/12/2015 1145   BILITOT 0.70 09/15/2015 0926   BILITOT <0.2 08/18/2015 1503   BILITOT 0.47 07/12/2015 1145     Impression and Plan: Mr. Scholes is a very pleasant 75 yo white male with stage I-HPV+ - squamous cell carcinoma the right tonsil. He has done well so far with chemotherapy and radiation. He does have a sore throat and neck. We will change his pain medication to oxycodone elixir 7.5 mg Q6H PRN. Hopefully this will help control his discomfort.  He will notify us on Monday if he still has not had a BM.  His CBC today looks good. His Chloride is a little low at 92 today. We will give him fluids and also see if he can receive fluids of Tuesdays with radiation.  He is now s/p 13/35 fractions of radiation. We will proceed with cycle 4 of chemotherapy today as planned.  He has his current treatment and appointment scheduled.  Both he and his wife know to contact us with any questions or concerns. We can certainly see him sooner if need be.   Verdie Mosher, NP 7/7/201710:13 AM

## 2015-09-18 ENCOUNTER — Other Ambulatory Visit: Payer: Self-pay | Admitting: Family

## 2015-09-18 ENCOUNTER — Ambulatory Visit
Admission: RE | Admit: 2015-09-18 | Discharge: 2015-09-18 | Disposition: A | Discharge status: Home / Self Care | Payer: Non-veteran care | Source: Ambulatory Visit | Attending: Radiation Oncology | Admitting: Radiation Oncology

## 2015-09-18 ENCOUNTER — Other Ambulatory Visit: Payer: Self-pay | Admitting: Hematology & Oncology

## 2015-09-18 DIAGNOSIS — Z510 Encounter for antineoplastic radiation therapy: Secondary | ICD-10-CM | POA: Diagnosis not present

## 2015-09-19 ENCOUNTER — Ambulatory Visit
Admission: RE | Admit: 2015-09-19 | Discharge: 2015-09-19 | Disposition: A | Discharge status: Home / Self Care | Payer: Non-veteran care | Source: Ambulatory Visit | Attending: Radiation Oncology | Admitting: Radiation Oncology

## 2015-09-19 ENCOUNTER — Ambulatory Visit
Admission: RE | Admit: 2015-09-19 | Discharge: 2015-09-19 | Disposition: A | Discharge status: Home / Self Care | Payer: No Typology Code available for payment source | Source: Ambulatory Visit | Attending: Radiation Oncology | Admitting: Radiation Oncology

## 2015-09-19 ENCOUNTER — Encounter: Payer: Self-pay | Admitting: Radiation Oncology

## 2015-09-19 ENCOUNTER — Ambulatory Visit (HOSPITAL_BASED_OUTPATIENT_CLINIC_OR_DEPARTMENT_OTHER): Payer: Medicare Other

## 2015-09-19 VITALS — BP 131/79 | HR 83 | Temp 98.1°F | Ht 72.0 in | Wt 219.5 lb

## 2015-09-19 VITALS — BP 122/70 | HR 76 | Temp 98.4°F | Resp 16

## 2015-09-19 DIAGNOSIS — C099 Malignant neoplasm of tonsil, unspecified: Secondary | ICD-10-CM

## 2015-09-19 DIAGNOSIS — C090 Malignant neoplasm of tonsillar fossa: Secondary | ICD-10-CM

## 2015-09-19 DIAGNOSIS — Z510 Encounter for antineoplastic radiation therapy: Secondary | ICD-10-CM | POA: Diagnosis not present

## 2015-09-19 MED ORDER — HEPARIN SOD (PORK) LOCK FLUSH 100 UNIT/ML IV SOLN
500.0000 [IU] | Freq: Once | INTRAVENOUS | Status: AC | PRN
Start: 2015-09-19 — End: 2015-09-19
  Administered 2015-09-19: 500 [IU]
  Filled 2015-09-19: qty 5

## 2015-09-19 MED ORDER — SODIUM CHLORIDE 0.9 % IJ SOLN
10.0000 mL | INTRAMUSCULAR | Status: DC | PRN
  Administered 2015-09-19: 10 mL
  Filled 2015-09-19: qty 10

## 2015-09-19 MED ORDER — SODIUM CHLORIDE 0.9 % IV SOLN
Freq: Once | INTRAVENOUS | Status: AC
  Administered 2015-09-19: 09:00:00 via INTRAVENOUS

## 2015-09-19 NOTE — Progress Notes (Signed)
Order confirmed from [place] per Dr. Marin Olp - hydration today is normal saline not half normal saline.

## 2015-09-19 NOTE — Progress Notes (Signed)
Mr. Ciardi presents for his 16th fraction of radiation to his Right Tonsil/ Bilateral Neck. He denies pain except soreness in his bottom from recent diarrhea. He believes his diarrhea was caused from using excess laxatives because of constipation. He received IV hydration in medical oncology today. He reports he is not eating well. When he does not eat enough he will supplement with boost. He states at most 2-3 Boost a day. He is not drinking enough water per his report. He states that the taste changes has caused him to not want to eat. His neck is slightly red and hyperpigmented, and he is using the sonafine cream twice daily as directed.   BP 131/79 mmHg  Pulse 83  Temp(Src) 98.1 F (36.7 C)  Ht 6' (1.829 m)  Wt 219 lb 8 oz (99.565 kg)  BMI 29.76 kg/m2  SpO2 97%   Wt Readings from Last 3 Encounters:  09/19/15 219 lb 8 oz (99.565 kg)  09/15/15 221 lb (100.245 kg)  09/15/15 222 lb (100.699 kg)

## 2015-09-19 NOTE — Patient Instructions (Signed)

## 2015-09-20 ENCOUNTER — Telehealth: Payer: Self-pay

## 2015-09-20 ENCOUNTER — Ambulatory Visit
Admission: RE | Admit: 2015-09-20 | Discharge: 2015-09-20 | Disposition: A | Discharge status: Home / Self Care | Payer: Non-veteran care | Source: Ambulatory Visit | Attending: Radiation Oncology | Admitting: Radiation Oncology

## 2015-09-20 DIAGNOSIS — Z510 Encounter for antineoplastic radiation therapy: Secondary | ICD-10-CM | POA: Diagnosis not present

## 2015-09-20 NOTE — Telephone Encounter (Signed)
I returned a phone call regarding a concern that Mr. Fairclough wife had. She was concerned that Mr. Crilley has a "boil" to his bottom, which they think occurred because of recent diarrhea the patient experienced. Dr. Isidore Moos saw him yesterday and recommended a sitz bath to help, but she reports he is not feeling any better. She said he has had this in the past, and they recommended watching it, or having it lanced. I suggested continuing the sitz bath, and pain medicine as needed. Dr. Antonieta Pert office also might be of some assistance, as he has an appointment with him on Friday. I also encouraged him to come to nursing after radiation tomorrow or Friday if needed.

## 2015-09-20 NOTE — Progress Notes (Signed)
Weekly Management Note:  Outpatient    ICD-9-CM ICD-10-CM   1. Carcinoma of tonsillar fossa (HCC) 146.1 C09.0     Current Dose:  32 Gy  Projected Dose: 70 Gy      Narrative:  The patient presents for routine under treatment assessment.  CBCT/MVCT images/Port film x-rays were reviewed.  The chart was checked.   Randy Hanson presents for his 16th fraction of radiation to his Right Tonsil/ Bilateral Neck. He denies pain except soreness in his bottom from recent diarrhea. He believes his diarrhea was caused from using excess laxatives because of constipation. He received IV hydration in medical oncology today. He reports he is not eating well. When he does not eat enough he will supplement with boost. He states at most 2-3 Boost a day. He is not drinking enough water per his report. He states that the taste changes has caused him to not want to eat.  he is using the sonafine cream twice daily as directed.   Physical Findings:  Wt Readings from Last 3 Encounters:  09/19/15 219 lb 8 oz (99.565 kg)  09/15/15 221 lb (100.245 kg)  09/15/15 222 lb (100.699 kg)    height is 6' (1.829 m) and weight is 219 lb 8 oz (99.565 kg). His temperature is 98.1 F (36.7 C). His blood pressure is 131/79 and his pulse is 83. His oxygen saturation is 97%.  NAD,  In oral cavity there is thick saliva, with mucositis  - His neck is slightly red and hyperpigmented   CBC    Component Value Date/Time   WBC 7.2 09/15/2015 0926   WBC 5.6 08/24/2015 0859   RBC 4.36 09/15/2015 0926   RBC 4.74 08/24/2015 0859   HGB 13.9 09/15/2015 0926   HGB 14.3 08/24/2015 0859   HCT 38.4* 09/15/2015 0926   HCT 42.6 08/24/2015 0859   PLT 205 09/15/2015 0926   PLT 219 08/24/2015 0859   MCV 88 09/15/2015 0926   MCV 89.9 08/24/2015 0859   MCH 31.9 09/15/2015 0926   MCH 30.2 08/24/2015 0859   MCHC 36.2* 09/15/2015 0926   MCHC 33.6 08/24/2015 0859   RDW 12.2 09/15/2015 0926   RDW 12.9 08/24/2015 0859   LYMPHSABS 0.4* 09/15/2015  0926   LYMPHSABS 1.5 08/24/2015 0859   MONOABS 0.4 08/24/2015 0859   EOSABS 0.0 09/15/2015 0926   EOSABS 0.3 08/24/2015 0859   BASOSABS 0.0 09/15/2015 0926   BASOSABS 0.0 08/24/2015 0859     CMP     Component Value Date/Time   NA 130 09/15/2015 0926   NA 136 08/18/2015 1503   NA 139 07/12/2015 1145   K 3.6 09/15/2015 0926   K 3.5 08/18/2015 1503   K 4.1 07/12/2015 1145   CL 92* 09/15/2015 0926   CL 100 08/18/2015 1503   CO2 30 09/15/2015 0926   CO2 24 08/18/2015 1503   CO2 28 07/12/2015 1145   GLUCOSE 132* 09/15/2015 0926   GLUCOSE 176* 08/18/2015 1503   GLUCOSE 98 07/12/2015 1145   BUN 24* 09/15/2015 0926   BUN 19 08/18/2015 1503   BUN 19.8 07/12/2015 1145   CREATININE 1.0 09/15/2015 0926   CREATININE 0.97 08/18/2015 1503   CREATININE 1.1 07/12/2015 1145   CALCIUM 9.7 09/15/2015 0926   CALCIUM 9.7 08/18/2015 1503   CALCIUM 10.0 07/12/2015 1145   PROT 7.2 09/15/2015 0926   PROT 7.1 08/18/2015 1503   PROT 7.2 07/12/2015 1145   ALBUMIN 3.4 09/15/2015 0926   ALBUMIN 4.2  08/18/2015 1503   ALBUMIN 3.9 07/12/2015 1145   AST 21 09/15/2015 0926   AST 18 08/18/2015 1503   AST 20 07/12/2015 1145   ALT 28 09/15/2015 0926   ALT 14 08/18/2015 1503   ALT 16 07/12/2015 1145   ALKPHOS 65 09/15/2015 0926   ALKPHOS 71 08/18/2015 1503   ALKPHOS 61 07/12/2015 1145   BILITOT 0.70 09/15/2015 0926   BILITOT <0.2 08/18/2015 1503   BILITOT 0.47 07/12/2015 1145   GFRNONAA 78 08/18/2015 1503   GFRAA 90 08/18/2015 1503    Impression:  The patient is tolerating radiotherapy.     Plan:  Continue radiotherapy as planned.   Pt advised to push PO intake to 6 cans of nutritional supplements daily. Plus Water. He wants to avoid getting a PEG tube. He thinks he can increase PO intake.  PEG tube will be considered, if needed,and we'll continue to monitor him for this.  -----------------------------------  Eppie Gibson, MD

## 2015-09-21 ENCOUNTER — Encounter: Payer: No Typology Code available for payment source | Admitting: Nutrition

## 2015-09-21 ENCOUNTER — Ambulatory Visit
Admission: RE | Admit: 2015-09-21 | Discharge: 2015-09-21 | Disposition: A | Discharge status: Home / Self Care | Payer: Non-veteran care | Source: Ambulatory Visit | Attending: Radiation Oncology | Admitting: Radiation Oncology

## 2015-09-21 DIAGNOSIS — L0232 Furuncle of buttock: Secondary | ICD-10-CM | POA: Diagnosis not present

## 2015-09-21 DIAGNOSIS — K611 Rectal abscess: Secondary | ICD-10-CM | POA: Diagnosis not present

## 2015-09-21 DIAGNOSIS — Z510 Encounter for antineoplastic radiation therapy: Secondary | ICD-10-CM | POA: Diagnosis not present

## 2015-09-22 ENCOUNTER — Other Ambulatory Visit: Payer: Self-pay | Admitting: Family

## 2015-09-22 ENCOUNTER — Ambulatory Visit (HOSPITAL_BASED_OUTPATIENT_CLINIC_OR_DEPARTMENT_OTHER): Payer: Medicare Other

## 2015-09-22 ENCOUNTER — Ambulatory Visit
Admission: RE | Admit: 2015-09-22 | Discharge: 2015-09-22 | Disposition: A | Discharge status: Home / Self Care | Payer: Non-veteran care | Source: Ambulatory Visit | Attending: Radiation Oncology | Admitting: Radiation Oncology

## 2015-09-22 ENCOUNTER — Other Ambulatory Visit (HOSPITAL_BASED_OUTPATIENT_CLINIC_OR_DEPARTMENT_OTHER): Payer: Medicare Other

## 2015-09-22 DIAGNOSIS — Z5111 Encounter for antineoplastic chemotherapy: Secondary | ICD-10-CM

## 2015-09-22 DIAGNOSIS — C099 Malignant neoplasm of tonsil, unspecified: Secondary | ICD-10-CM

## 2015-09-22 DIAGNOSIS — C090 Malignant neoplasm of tonsillar fossa: Secondary | ICD-10-CM

## 2015-09-22 DIAGNOSIS — Z510 Encounter for antineoplastic radiation therapy: Secondary | ICD-10-CM | POA: Diagnosis not present

## 2015-09-22 LAB — CBC WITH DIFFERENTIAL (CANCER CENTER ONLY)
BASO#: 0 10*3/uL (ref 0.0–0.2)
BASO%: 0.5 % (ref 0.0–2.0)
EOS%: 0.5 % (ref 0.0–7.0)
Eosinophils Absolute: 0 10*3/uL (ref 0.0–0.5)
HCT: 36.3 % — ABNORMAL LOW (ref 38.7–49.9)
HGB: 12.9 g/dL — ABNORMAL LOW (ref 13.0–17.1)
LYMPH#: 0.2 10*3/uL — AB (ref 0.9–3.3)
LYMPH%: 5.3 % — AB (ref 14.0–48.0)
MCH: 31.7 pg (ref 28.0–33.4)
MCHC: 35.5 g/dL (ref 32.0–35.9)
MCV: 89 fL (ref 82–98)
MONO#: 0.4 10*3/uL (ref 0.1–0.9)
MONO%: 9.1 % (ref 0.0–13.0)
NEUT#: 3.3 10*3/uL (ref 1.5–6.5)
NEUT%: 84.6 % — AB (ref 40.0–80.0)
PLATELETS: 151 10*3/uL (ref 145–400)
RBC: 4.07 10*6/uL — ABNORMAL LOW (ref 4.20–5.70)
RDW: 12.4 % (ref 11.1–15.7)
WBC: 3.9 10*3/uL — ABNORMAL LOW (ref 4.0–10.0)

## 2015-09-22 LAB — CMP (CANCER CENTER ONLY)
ALK PHOS: 60 U/L (ref 26–84)
ALT: 35 U/L (ref 10–47)
AST: 23 U/L (ref 11–38)
Albumin: 3.1 g/dL — ABNORMAL LOW (ref 3.3–5.5)
BILIRUBIN TOTAL: 0.7 mg/dL (ref 0.20–1.60)
BUN: 21 mg/dL (ref 7–22)
CO2: 32 mEq/L (ref 18–33)
Calcium: 9.4 mg/dL (ref 8.0–10.3)
Chloride: 95 mEq/L — ABNORMAL LOW (ref 98–108)
Creat: 1 mg/dl (ref 0.6–1.2)
GLUCOSE: 148 mg/dL — AB (ref 73–118)
POTASSIUM: 3.5 meq/L (ref 3.3–4.7)
SODIUM: 131 meq/L (ref 128–145)
Total Protein: 6.6 g/dL (ref 6.4–8.1)

## 2015-09-22 MED ORDER — PALONOSETRON HCL INJECTION 0.25 MG/5ML
0.2500 mg | Freq: Once | INTRAVENOUS | Status: AC
  Administered 2015-09-22: 0.25 mg via INTRAVENOUS

## 2015-09-22 MED ORDER — SODIUM CHLORIDE 0.9% FLUSH
10.0000 mL | INTRAVENOUS | Status: DC | PRN
  Administered 2015-09-22: 10 mL
  Filled 2015-09-22: qty 10

## 2015-09-22 MED ORDER — SODIUM CHLORIDE 0.9 % IV SOLN
40.0000 mg/m2 | Freq: Once | INTRAVENOUS | Status: AC
  Administered 2015-09-22: 94 mg via INTRAVENOUS
  Filled 2015-09-22: qty 94

## 2015-09-22 MED ORDER — SODIUM CHLORIDE 0.9 % IV SOLN
Freq: Once | INTRAVENOUS | Status: AC
  Administered 2015-09-22: 15:00:00 via INTRAVENOUS

## 2015-09-22 MED ORDER — PALONOSETRON HCL INJECTION 0.25 MG/5ML
INTRAVENOUS | Status: AC
  Filled 2015-09-22: qty 5

## 2015-09-22 MED ORDER — POTASSIUM CHLORIDE 2 MEQ/ML IV SOLN
Freq: Once | INTRAVENOUS | Status: AC
  Administered 2015-09-22: 11:00:00 via INTRAVENOUS
  Filled 2015-09-22: qty 10

## 2015-09-22 MED ORDER — HEPARIN SOD (PORK) LOCK FLUSH 100 UNIT/ML IV SOLN
500.0000 [IU] | Freq: Once | INTRAVENOUS | Status: AC | PRN
  Administered 2015-09-22: 500 [IU]
  Filled 2015-09-22: qty 5

## 2015-09-22 MED ORDER — SODIUM CHLORIDE 0.9 % IV SOLN
Freq: Once | INTRAVENOUS | Status: AC
  Administered 2015-09-22: 10:00:00 via INTRAVENOUS

## 2015-09-22 MED ORDER — SODIUM CHLORIDE 0.9 % IV SOLN
Freq: Once | INTRAVENOUS | Status: AC
  Administered 2015-09-22: 14:00:00 via INTRAVENOUS
  Filled 2015-09-22: qty 5

## 2015-09-22 NOTE — Progress Notes (Signed)
Patient voided 300cc before cisplatin was ordered. dph

## 2015-09-22 NOTE — Patient Instructions (Signed)
[facility] Discharge Instructions for Patients Receiving Chemotherapy  Today you received the following chemotherapy agents:  Cisplatin  To help prevent nausea and vomiting after your treatment, we encourage you to take your nausea medications as directed. If you develop nausea and vomiting that is not controlled by your nausea medication, call the clinic.   BELOW ARE SYMPTOMS THAT SHOULD BE REPORTED IMMEDIATELY:  *FEVER GREATER THAN 100.5 F  *CHILLS WITH OR WITHOUT FEVER  NAUSEA AND VOMITING THAT IS NOT CONTROLLED WITH YOUR NAUSEA MEDICATION  *UNUSUAL SHORTNESS OF BREATH  *UNUSUAL BRUISING OR BLEEDING  TENDERNESS IN MOUTH AND THROAT WITH OR WITHOUT PRESENCE OF ULCERS  *URINARY PROBLEMS  *BOWEL PROBLEMS  UNUSUAL RASH Items with * indicate a potential emergency and should be followed up as soon as possible.  Feel free to call the clinic you have any questions or concerns. The clinic phone number is [phone].  Please show the [place] at check-in to the Emergency Department and triage nurse. Cisplatin injection What is this medicine? CISPLATIN (SIS pla tin) is a chemotherapy drug. It targets fast dividing cells, like cancer cells, and causes these cells to die. This medicine is used to treat many types of cancer like bladder, ovarian, and testicular cancers. This medicine may be used for other purposes; ask your health care provider or pharmacist if you have questions. What should I tell my health care provider before I take this medicine? They need to know if you have any of these conditions: -blood disorders -hearing problems -kidney disease -recent or ongoing radiation therapy -an unusual or allergic reaction to cisplatin, carboplatin, other chemotherapy, other medicines, foods, dyes, or preservatives -pregnant or trying to get pregnant -breast-feeding How should I use this medicine? This drug is given as an infusion into a vein. It is  administered in a hospital or clinic by a specially trained health care professional. Talk to your pediatrician regarding the use of this medicine in children. Special care may be needed. Overdosage: If you think you have taken too much of this medicine contact a poison control center or emergency room at once. NOTE: This medicine is only for you. Do not share this medicine with others. What if I miss a dose? It is important not to miss a dose. Call your doctor or health care professional if you are unable to keep an appointment. What may interact with this medicine? -dofetilide -foscarnet -medicines for seizures -medicines to increase blood counts like filgrastim, pegfilgrastim, sargramostim -probenecid -pyridoxine used with altretamine -rituximab -some antibiotics like amikacin, gentamicin, neomycin, polymyxin B, streptomycin, tobramycin -sulfinpyrazone -vaccines -zalcitabine Talk to your doctor or health care professional before taking any of these medicines: -acetaminophen -aspirin -ibuprofen -ketoprofen -naproxen This list may not describe all possible interactions. Give your health care provider a list of all the medicines, herbs, non-prescription drugs, or dietary supplements you use. Also tell them if you smoke, drink alcohol, or use illegal drugs. Some items may interact with your medicine. What should I watch for while using this medicine? Your condition will be monitored carefully while you are receiving this medicine. You will need important blood work done while you are taking this medicine. This drug may make you feel generally unwell. This is not uncommon, as chemotherapy can affect healthy cells as well as cancer cells. Report any side effects. Continue your course of treatment even though you feel ill unless your doctor tells you to stop. In some cases, you may be given additional medicines  to help with side effects. Follow all directions for their use. Call your doctor  or health care professional for advice if you get a fever, chills or sore throat, or other symptoms of a cold or flu. Do not treat yourself. This drug decreases your body's ability to fight infections. Try to avoid being around people who are sick. This medicine may increase your risk to bruise or bleed. Call your doctor or health care professional if you notice any unusual bleeding. Be careful brushing and flossing your teeth or using a toothpick because you may get an infection or bleed more easily. If you have any dental work done, tell your dentist you are receiving this medicine. Avoid taking products that contain aspirin, acetaminophen, ibuprofen, naproxen, or ketoprofen unless instructed by your doctor. These medicines may hide a fever. Do not become pregnant while taking this medicine. Women should inform their doctor if they wish to become pregnant or think they might be pregnant. There is a potential for serious side effects to an unborn child. Talk to your health care professional or pharmacist for more information. Do not breast-feed an infant while taking this medicine. Drink fluids as directed while you are taking this medicine. This will help protect your kidneys. Call your doctor or health care professional if you get diarrhea. Do not treat yourself. What side effects may I notice from receiving this medicine? Side effects that you should report to your doctor or health care professional as soon as possible: -allergic reactions like skin rash, itching or hives, swelling of the face, lips, or tongue -signs of infection - fever or chills, cough, sore throat, pain or difficulty passing urine -signs of decreased platelets or bleeding - bruising, pinpoint red spots on the skin, black, tarry stools, nosebleeds -signs of decreased red blood cells - unusually weak or tired, fainting spells, lightheadedness -breathing problems -changes in hearing -gout pain -low blood counts - This drug may  decrease the number of white blood cells, red blood cells and platelets. You may be at increased risk for infections and bleeding. -nausea and vomiting -pain, swelling, redness or irritation at the injection site -pain, tingling, numbness in the hands or feet -problems with balance, movement -trouble passing urine or change in the amount of urine Side effects that usually do not require medical attention (report to your doctor or health care professional if they continue or are bothersome): -changes in vision -loss of appetite -metallic taste in the mouth or changes in taste This list may not describe all possible side effects. Call your doctor for medical advice about side effects. You may report side effects to FDA at 1-800-FDA-1088. Where should I keep my medicine? This drug is given in a hospital or clinic and will not be stored at home. NOTE: This sheet is a summary. It may not cover all possible information. If you have questions about this medicine, talk to your doctor, pharmacist, or health care provider.    2016, Elsevier/Gold Standard. (2007-06-02 14:40:54)    

## 2015-09-25 ENCOUNTER — Ambulatory Visit
Admission: RE | Admit: 2015-09-25 | Discharge: 2015-09-25 | Disposition: A | Discharge status: Home / Self Care | Payer: Non-veteran care | Source: Ambulatory Visit | Attending: Radiation Oncology | Admitting: Radiation Oncology

## 2015-09-25 ENCOUNTER — Telehealth: Payer: Self-pay | Admitting: *Deleted

## 2015-09-25 ENCOUNTER — Other Ambulatory Visit: Payer: Self-pay | Admitting: General Surgery

## 2015-09-25 ENCOUNTER — Ambulatory Visit
Admission: RE | Admit: 2015-09-25 | Discharge: 2015-09-25 | Disposition: A | Discharge status: Home / Self Care | Payer: Medicare Other | Source: Ambulatory Visit | Attending: Radiation Oncology | Admitting: Radiation Oncology

## 2015-09-25 ENCOUNTER — Encounter: Payer: Self-pay | Admitting: Radiation Oncology

## 2015-09-25 VITALS — BP 96/64 | HR 102 | Temp 98.2°F | Resp 18 | Ht <= 58 in | Wt 214.7 lb

## 2015-09-25 DIAGNOSIS — Z510 Encounter for antineoplastic radiation therapy: Secondary | ICD-10-CM | POA: Diagnosis not present

## 2015-09-25 DIAGNOSIS — C090 Malignant neoplasm of tonsillar fossa: Secondary | ICD-10-CM | POA: Insufficient documentation

## 2015-09-25 MED ORDER — SONAFINE EX EMUL
1.0000 "application " | Freq: Two times a day (BID) | CUTANEOUS | Status: DC
  Administered 2015-09-25: 1 via TOPICAL

## 2015-09-25 NOTE — Progress Notes (Signed)
Mr. Cheuvront presents for his 20t h fraction of radiation to his Right Tonsil/ Bilateral Neck.  He will receive IV hydration in medical oncology tomorrow. He reports he is not eating fair. When he does not eat enough he will supplement with boost. He states at most 2-3 Boost a day. He is not drinking enough water per his report. He states that the taste changes has caused him to not want to eat. Having increased difficulty swallowing without pain.  His neck is slightly red and hyperpigmented, and he is using the sonafine cream two to three times a  Day. Wt Readings from Last 3 Encounters:  09/25/15 214 lb 11.2 oz (97.387 kg)  09/19/15 219 lb 8 oz (99.565 kg)  09/15/15 221 lb (100.245 kg)   BP 129/74 mmHg  Pulse 79  Temp(Src) 98.2 F (36.8 C) (Oral)  Resp 18  Ht 6" (0.152 m)  Wt 214 lb 11.2 oz (97.387 kg)  BMI 4215.16 kg/m2  SpO2 95%

## 2015-09-25 NOTE — Progress Notes (Signed)
Weekly Management Note:  Outpatient    ICD-9-CM ICD-10-CM   1. Carcinoma of tonsillar fossa (HCC) 146.1 C09.0 SONAFINE emulsion 1 application     IR GASTROSTOMY TUBE MOD SED    Current Dose:  40 Gy  Projected Dose: 70 Gy      Narrative:  The patient presents for routine under treatment assessment.  CBCT/MVCT images/Port film x-rays were reviewed.  The chart was checked.  Randy Hanson presents for his 20th fraction of radiation to his Right Tonsil/ Bilateral Neck. He will receive IV hydration in medical oncology tomorrow. He reports he is not eating fair. When he does not eat enough he will supplement with boost. He states at most 2-3 Boost a day. He is not drinking enough water per his report. He states that the taste changes has caused him to not want to eat. Having increased difficulty swallowing without pain. His neck is slightly red and hyperpigmented, and he is using the sonafine cream two to three times a day. He denies dizziness when standing.   Physical Findings:  Wt Readings from Last 3 Encounters:  09/25/15 214 lb 11.2 oz (97.387 kg)  09/19/15 219 lb 8 oz (99.565 kg)  09/15/15 221 lb (100.245 kg)    height is 6" (0.152 m) and weight is 214 lb 11.2 oz (97.387 kg). His oral temperature is 98.2 F (36.8 C). His blood pressure is 96/64 and his pulse is 102. His respiration is 18 and oxygen saturation is 95%.  Vitals with BMI 09/25/2015 09/25/2015  Height    Weight    BMI    Systolic 96 [id number]  Diastolic 64 76  Pulse [id number]  Respirations    ORTHOSTASIS NOTED Patchy mucositis and erythema in his oropharynx but no signs of thrush. Mucus membranes are dry. Skin over neck is slightly hyperpigmented and dry. No obvious masses palpated in his neck.   CBC    Component Value Date/Time   WBC 3.9* 09/22/2015 0921   WBC 5.6 08/24/2015 0859   RBC 4.07* 09/22/2015 0921   RBC 4.74 08/24/2015 0859   HGB 12.9* 09/22/2015 0921   HGB 14.3 08/24/2015 0859   HCT 36.3* 09/22/2015 0921   HCT  42.6 08/24/2015 0859   PLT 151 09/22/2015 0921   PLT 219 08/24/2015 0859   MCV 89 09/22/2015 0921   MCV 89.9 08/24/2015 0859   MCH 31.7 09/22/2015 0921   MCH 30.2 08/24/2015 0859   MCHC 35.5 09/22/2015 0921   MCHC 33.6 08/24/2015 0859   RDW 12.4 09/22/2015 0921   RDW 12.9 08/24/2015 0859   LYMPHSABS 0.2* 09/22/2015 0921   LYMPHSABS 1.5 08/24/2015 0859   MONOABS 0.4 08/24/2015 0859   EOSABS 0.0 09/22/2015 0921   EOSABS 0.3 08/24/2015 0859   BASOSABS 0.0 09/22/2015 0921   BASOSABS 0.0 08/24/2015 0859     CMP     Component Value Date/Time   NA 131 09/22/2015 0922   NA 136 08/18/2015 1503   NA 139 07/12/2015 1145   K 3.5 09/22/2015 0922   K 3.5 08/18/2015 1503   K 4.1 07/12/2015 1145   CL 95* 09/22/2015 0922   CL 100 08/18/2015 1503   CO2 32 09/22/2015 0922   CO2 24 08/18/2015 1503   CO2 28 07/12/2015 1145   GLUCOSE 148* 09/22/2015 0922   GLUCOSE 176* 08/18/2015 1503   GLUCOSE 98 07/12/2015 1145   BUN 21 09/22/2015 0922   BUN 19 08/18/2015 1503   BUN 19.8 07/12/2015 1145  CREATININE 1.0 09/22/2015 0922   CREATININE 0.97 08/18/2015 1503   CREATININE 1.1 07/12/2015 1145   CALCIUM 9.4 09/22/2015 0922   CALCIUM 9.7 08/18/2015 1503   CALCIUM 10.0 07/12/2015 1145   PROT 6.6 09/22/2015 0922   PROT 7.1 08/18/2015 1503   PROT 7.2 07/12/2015 1145   ALBUMIN 3.1* 09/22/2015 0922   ALBUMIN 4.2 08/18/2015 1503   ALBUMIN 3.9 07/12/2015 1145   AST 23 09/22/2015 0922   AST 18 08/18/2015 1503   AST 20 07/12/2015 1145   ALT 35 09/22/2015 0922   ALT 14 08/18/2015 1503   ALT 16 07/12/2015 1145   ALKPHOS 60 09/22/2015 0922   ALKPHOS 71 08/18/2015 1503   ALKPHOS 61 07/12/2015 1145   BILITOT 0.70 09/22/2015 0922   BILITOT <0.2 08/18/2015 1503   BILITOT 0.47 07/12/2015 1145   GFRNONAA 78 08/18/2015 1503   GFRAA 90 08/18/2015 1503    Impression:  The patient is tolerating radiotherapy.     Plan:  Continue radiotherapy as planned. We discussed that he would benefit from a PEG  tube due to his weight loss , dehydration, malnutrition and inability to take in more by mouth despite our long talk last week.  Nutritionist has documented concerns about his intake and nutritional status.Marland Kitchen He would like to proceed with having a PEG tube placed. He has a follow up appointment with Dr. Marin Olp Friday. He doesn't mind the taste of grape Gatorade so I recommend that he drinks more of that today before IV fluids, which he states he has tomorrow.   -----------------------------------  Eppie Gibson, MD    This document serves as a record of services personally performed by Eppie Gibson, MD. It was created on her behalf by Lendon Collar, a trained medical scribe. The creation of this record is based on the scribe's personal observations and the provider's statements to them. This document has been checked and approved by the attending provider.

## 2015-09-25 NOTE — Telephone Encounter (Signed)
Call from patient's wife with questions about what to pick up, where and how for gastrostomy tube.  Call transferred to [phone] for RT per [facility] Phone Tree algorithm.

## 2015-09-25 NOTE — Telephone Encounter (Signed)
  Oncology Nurse Navigator Documentation  Navigator Location: [facility] (09/25/15 1719) Navigator Encounter Type: Telephone (09/25/15 1719) Telephone: Lahoma Crocker Call;Appt Confirmation/Clarification;Education (09/25/15 1719)             Barriers/Navigation Needs: Education (09/25/15 1719) Education: Preparing for Upcoming Surgery/ Treatment (09/25/15 1719)       Called Randy Hanson regarding tomorrow's appt for PEG placement, spoke with his wife. I addressed her questions, explained I will join them to provide education on care and use of PEG. She voiced understanding and appreciation for my call.  Gayleen Orem, RN, BSN, [name] at [place] [phone]                     Time Spent with Patient: 15 (09/25/15 1719)

## 2015-09-25 NOTE — Telephone Encounter (Signed)
Called patient to inform of Peg tube placement for 09/26/15 @ 1 pm @ [facility], spoke with patient's wife and she is aware of this appt.

## 2015-09-26 ENCOUNTER — Encounter: Payer: Self-pay | Admitting: *Deleted

## 2015-09-26 ENCOUNTER — Ambulatory Visit (HOSPITAL_COMMUNITY)
Admission: RE | Admit: 2015-09-26 | Discharge: 2015-09-26 | Disposition: A | Discharge status: Home / Self Care | Payer: Medicare Other | Source: Ambulatory Visit | Attending: Radiation Oncology | Admitting: Radiation Oncology

## 2015-09-26 ENCOUNTER — Encounter (HOSPITAL_COMMUNITY): Payer: Self-pay

## 2015-09-26 ENCOUNTER — Ambulatory Visit
Admission: RE | Admit: 2015-09-26 | Discharge: 2015-09-26 | Disposition: A | Discharge status: Home / Self Care | Payer: Non-veteran care | Source: Ambulatory Visit | Attending: Radiation Oncology | Admitting: Radiation Oncology

## 2015-09-26 ENCOUNTER — Ambulatory Visit (HOSPITAL_BASED_OUTPATIENT_CLINIC_OR_DEPARTMENT_OTHER): Payer: Medicare Other

## 2015-09-26 VITALS — BP 136/76 | HR 71 | Temp 98.4°F | Resp 18

## 2015-09-26 DIAGNOSIS — Y842 Radiological procedure and radiotherapy as the cause of abnormal reaction of the patient, or of later complication, without mention of misadventure at the time of the procedure: Secondary | ICD-10-CM | POA: Diagnosis not present

## 2015-09-26 DIAGNOSIS — I10 Essential (primary) hypertension: Secondary | ICD-10-CM | POA: Insufficient documentation

## 2015-09-26 DIAGNOSIS — R1310 Dysphagia, unspecified: Secondary | ICD-10-CM | POA: Insufficient documentation

## 2015-09-26 DIAGNOSIS — K209 Esophagitis, unspecified: Secondary | ICD-10-CM | POA: Diagnosis not present

## 2015-09-26 DIAGNOSIS — Z87891 Personal history of nicotine dependence: Secondary | ICD-10-CM | POA: Insufficient documentation

## 2015-09-26 DIAGNOSIS — Z7982 Long term (current) use of aspirin: Secondary | ICD-10-CM | POA: Diagnosis not present

## 2015-09-26 DIAGNOSIS — K210 Gastro-esophageal reflux disease with esophagitis: Secondary | ICD-10-CM | POA: Insufficient documentation

## 2015-09-26 DIAGNOSIS — C099 Malignant neoplasm of tonsil, unspecified: Secondary | ICD-10-CM | POA: Insufficient documentation

## 2015-09-26 DIAGNOSIS — C090 Malignant neoplasm of tonsillar fossa: Secondary | ICD-10-CM

## 2015-09-26 DIAGNOSIS — Z510 Encounter for antineoplastic radiation therapy: Secondary | ICD-10-CM | POA: Diagnosis not present

## 2015-09-26 DIAGNOSIS — E860 Dehydration: Secondary | ICD-10-CM | POA: Diagnosis present

## 2015-09-26 DIAGNOSIS — Z79899 Other long term (current) drug therapy: Secondary | ICD-10-CM | POA: Insufficient documentation

## 2015-09-26 LAB — CBC
HEMATOCRIT: 34.2 % — AB (ref 39.0–52.0)
Hemoglobin: 12.2 g/dL — ABNORMAL LOW (ref 13.0–17.0)
MCH: 31.2 pg (ref 26.0–34.0)
MCHC: 35.7 g/dL (ref 30.0–36.0)
MCV: 87.5 fL (ref 78.0–100.0)
PLATELETS: 124 10*3/uL — AB (ref 150–400)
RBC: 3.91 MIL/uL — ABNORMAL LOW (ref 4.22–5.81)
RDW: 13 % (ref 11.5–15.5)
WBC: 3.5 10*3/uL — AB (ref 4.0–10.5)

## 2015-09-26 LAB — PROTIME-INR
INR: 1.04 (ref 0.00–1.49)
Prothrombin Time: 13.8 seconds (ref 11.6–15.2)

## 2015-09-26 LAB — APTT: APTT: 22 s — AB (ref 24–37)

## 2015-09-26 MED ORDER — MIDAZOLAM HCL 2 MG/2ML IJ SOLN
INTRAMUSCULAR | Status: AC | PRN
  Administered 2015-09-26 (×5): 1 mg via INTRAVENOUS

## 2015-09-26 MED ORDER — LIDOCAINE HCL 1 % IJ SOLN
INTRAMUSCULAR | Status: AC
  Filled 2015-09-26: qty 20

## 2015-09-26 MED ORDER — HEPARIN SOD (PORK) LOCK FLUSH 100 UNIT/ML IV SOLN
500.0000 [IU] | INTRAVENOUS | Status: AC | PRN
  Administered 2015-09-26: 500 [IU]
  Filled 2015-09-26: qty 5

## 2015-09-26 MED ORDER — HYDROMORPHONE HCL 1 MG/ML IJ SOLN: 1.0000 mg | INTRAMUSCULAR | Status: DC | PRN

## 2015-09-26 MED ORDER — IOPAMIDOL (ISOVUE-300) INJECTION 61%
10.0000 mL | Freq: Once | INTRAVENOUS | Status: AC | PRN
  Administered 2015-09-26: 10 mL

## 2015-09-26 MED ORDER — SODIUM CHLORIDE 0.9 % IV SOLN
INTRAVENOUS | Status: DC
  Administered 2015-09-26: 14:00:00 via INTRAVENOUS

## 2015-09-26 MED ORDER — CEFAZOLIN SODIUM-DEXTROSE 2-4 GM/100ML-% IV SOLN
2.0000 g | INTRAVENOUS | Status: AC
  Administered 2015-09-26: 2 g via INTRAVENOUS
  Filled 2015-09-26: qty 100

## 2015-09-26 MED ORDER — HYDROMORPHONE HCL 1 MG/ML IJ SOLN
INTRAMUSCULAR | Status: AC | PRN
  Administered 2015-09-26: 1 mg via INTRAVENOUS

## 2015-09-26 MED ORDER — FENTANYL CITRATE (PF) 100 MCG/2ML IJ SOLN
INTRAMUSCULAR | Status: AC | PRN
  Administered 2015-09-26: 50 ug via INTRAVENOUS

## 2015-09-26 MED ORDER — DEXTROSE 5 % IV SOLN
Freq: Once | INTRAVENOUS | Status: AC
  Administered 2015-09-26: 09:00:00 via INTRAVENOUS

## 2015-09-26 MED ORDER — HEPARIN SOD (PORK) LOCK FLUSH 100 UNIT/ML IV SOLN
500.0000 [IU] | Freq: Once | INTRAVENOUS | Status: AC | PRN
  Administered 2015-09-26: 500 [IU]
  Filled 2015-09-26: qty 5

## 2015-09-26 MED ORDER — SODIUM CHLORIDE 0.9 % IJ SOLN
10.0000 mL | INTRAMUSCULAR | Status: DC | PRN
  Administered 2015-09-26: 10 mL
  Filled 2015-09-26: qty 10

## 2015-09-26 MED ORDER — GLUCAGON HCL RDNA (DIAGNOSTIC) 1 MG IJ SOLR
INTRAMUSCULAR | Status: AC
  Filled 2015-09-26: qty 1

## 2015-09-26 MED ORDER — FENTANYL CITRATE (PF) 100 MCG/2ML IJ SOLN
INTRAMUSCULAR | Status: AC
  Filled 2015-09-26: qty 4

## 2015-09-26 MED ORDER — LIDOCAINE HCL 1 % IJ SOLN
INTRAMUSCULAR | Status: DC | PRN
  Administered 2015-09-26: 5 mL

## 2015-09-26 MED ORDER — HYDROMORPHONE HCL 2 MG/ML IJ SOLN
INTRAMUSCULAR | Status: AC
  Filled 2015-09-26: qty 1

## 2015-09-26 MED ORDER — ONDANSETRON HCL 4 MG/2ML IJ SOLN: 4.0000 mg | INTRAMUSCULAR | Status: DC | PRN

## 2015-09-26 MED ORDER — MIDAZOLAM HCL 2 MG/2ML IJ SOLN
INTRAMUSCULAR | Status: AC
  Filled 2015-09-26: qty 8

## 2015-09-26 MED ORDER — HYDROCODONE-ACETAMINOPHEN 5-325 MG PO TABS
1.0000 | ORAL_TABLET | ORAL | Status: DC | PRN
  Administered 2015-09-26: 1 via ORAL
  Filled 2015-09-26: qty 1

## 2015-09-26 MED ORDER — GLUCAGON HCL RDNA (DIAGNOSTIC) 1 MG IJ SOLR
INTRAMUSCULAR | Status: AC | PRN
  Administered 2015-09-26: 1 mg via INTRAVENOUS

## 2015-09-26 NOTE — H&P (Signed)
Chief Complaint: Dysphagia secondary to radiation therapy  Referring Physician(s): Eppie Gibson  Supervising Physician: Jacqulynn Cadet  Patient Status: Outpatient  History of Present Illness: Randy Hanson is a 75 y.o. male who is known to our service.  He had a Port A Cath placed on 08/24/2015 by Dr. Barbie Banner  He has tonsillar cancer and has been undergoing chemotherapy and radiation.  He states the radiation has caused some dysphagia symptoms and also states he has had some taste changes as well which has caused him not to want to eat.  He has had some weight loss from 231 on 6/30 down to 219 on 7/11.  He is here today for placement of percutaneous gastrostomy tube.  He feels OK. He is NPO. He does not take blood thinners. He denies recent fever/chills.   Past Medical History  Diagnosis Date  . Hypertension   . Esophageal reflux   . Back pain   . Hypercholesteremia   . FHx: colonic polyps   . Hemorrhoids   . Hyperlipemia   . DJD (degenerative joint disease)   . BPH (benign prostatic hyperplasia)   . Posterior vitreous detachment, left eye     DR. KATHERINE HECKER  AUGUST 2011  . Kidney cysts     [facility] [place] [facility]  . Cancer of tonsil, palatine ([place]) 07/12/2015    Past Surgical History  Procedure Laterality Date  . Vasectomy  1975  . Cardiac catheterization      remote >15 years ago    Allergies: Lisinopril  Medications: Prior to Admission medications   Medication Sig Start Date End Date Taking? Authorizing Provider  aspirin 81 MG tablet Take 81 mg by mouth daily.   Yes Historical Provider, MD  b complex vitamins tablet Take 1 tablet by mouth daily. Reported on 09/19/2015   Yes Historical Provider, MD  cephALEXin (KEFLEX) 500 MG capsule Take 500 mg by mouth 2 (two) times daily.   Yes Historical Provider, MD  dexamethasone (DECADRON) 4 MG tablet Take 2 tablets by mouth once a day on the day after chemotherapy and then take 2 tablets  two times a day for 2 days. Take with food. 08/23/15  Yes Volanda Napoleon, MD  hydrochlorothiazide (HYDRODIURIL) 25 MG tablet Take 25 mg by mouth daily.   Yes Historical Provider, MD  lidocaine (XYLOCAINE) 2 % solution Patient: Mix 1part 2% viscous lidocaine, 1part H20. Swish and/or swallow 22mL of this mixture, 54min before meals and at bedtime, up to QID 09/04/15  Yes Eppie Gibson, MD  lidocaine-prilocaine (EMLA) cream Apply to affected area as needed 08/23/15  Yes Volanda Napoleon, MD  LORazepam (ATIVAN) 0.5 MG tablet Take 1 tablet (0.5 mg total) by mouth every 6 (six) hours as needed (Nausea or vomiting). 08/23/15  Yes Volanda Napoleon, MD  losartan (COZAAR) 50 MG tablet Take 50 mg by mouth daily.   Yes Historical Provider, MD  ondansetron (ZOFRAN) 8 MG tablet Take 1 tablet (8 mg total) by mouth 2 (two) times daily as needed. Start on the third day after chemotherapy. 08/23/15  Yes Volanda Napoleon, MD  prochlorperazine (COMPAZINE) 10 MG tablet Take 1 tablet (10 mg total) by mouth every 6 (six) hours as needed (Nausea or vomiting). 08/23/15  Yes Volanda Napoleon, MD  ranitidine (ZANTAC) 150 MG capsule Take 150 mg by mouth 2 (two) times daily.   Yes Historical Provider, MD  sucralfate (CARAFATE) 1 g tablet Dissolve 1 tablet in 10 mL  H20 and swallow up to QID prn sore throat. 09/04/15  Yes Eppie Gibson, MD  terazosin (HYTRIN) 2 MG capsule Take 2 mg by mouth at bedtime.   Yes Historical Provider, MD  Wound Dressings (SONAFINE) Apply 1 application topically 3 (three) times daily.   Yes Historical Provider, MD  Omega-3 Fatty Acids (FISH OIL) 1000 MG CAPS Take 1 capsule by mouth daily. Reported on 09/25/2015    Historical Provider, MD  oxyCODONE (ROXICODONE) 5 MG/5ML solution Take 7.5 mLs (7.5 mg total) by mouth every 6 (six) hours as needed for severe pain. 09/15/15   Eliezer Bottom, NP  Pantothenic Acid 500 MG TABS Take 1 tablet by mouth daily. Reported on 09/25/2015    Historical Provider, MD     Family  History  Problem Relation Age of Onset  . Hypertension Mother     Social History   Social History  . Marital Status: Married    Spouse Name: N/A  . Number of Children: N/A  . Years of Education: N/A   Social History Main Topics  . Smoking status: Former Research scientist (life sciences)  . Smokeless tobacco: None     Comment: quit 35 years ago  . Alcohol Use: No  . Drug Use: No  . Sexual Activity: Not Asked   Other Topics Concern  . None   Social History Narrative   TOBACCO USE CIGARETTES: NEVER SMOKED.NO SMOKING.NO ALCOHOL .CAFFEINE YES:NO  RECREATIONAL DRUGS. OCCUPATION :RETIRED   MARTIAL STATUS : MARRIED      Review of Systems: A 12 point ROS discussed  Review of Systems  Constitutional: Positive for activity change, appetite change, fatigue and unexpected weight change. Negative for fever and chills.  HENT: Positive for trouble swallowing.        Taste change  Respiratory: Negative for cough, shortness of breath and wheezing.   Cardiovascular: Negative for chest pain.  Gastrointestinal: Negative.   Genitourinary: Negative.   Musculoskeletal: Negative.   Skin: Negative.   Neurological: Negative.   Hematological: Negative.   Psychiatric/Behavioral: Negative.     Vital Signs: 136/76 P 71 R 18 Temp 98.4  Physical Exam  Constitutional: He is oriented to person, place, and time. He appears well-developed and well-nourished.  HENT:  Head: Normocephalic and atraumatic.  Eyes: EOM are normal.  Neck: Normal range of motion. Neck supple.  Cardiovascular: Normal rate, regular rhythm and normal heart sounds.   Pulmonary/Chest: Effort normal and breath sounds normal.  Abdominal: Soft. He exhibits no distension. There is no tenderness.  No scars  Musculoskeletal: Normal range of motion.  Neurological: He is alert and oriented to person, place, and time.  Skin: Skin is warm and dry.  Psychiatric: He has a normal mood and affect. His behavior is normal. Judgment and thought content normal.    Vitals reviewed.   Mallampati Score:  MD Evaluation Airway: WNL Heart: WNL Abdomen: WNL Chest/ Lungs: WNL ASA  Classification: 3 Mallampati/Airway Score: One  Imaging: No results found.  Labs:  CBC:  Recent Labs  09/01/15 0903 09/08/15 0850 09/15/15 0926 09/22/15 0921  WBC 10.2* 8.5 7.2 3.9*  HGB 14.8 14.0 13.9 12.9*  HCT 41.1 39.1 38.4* 36.3*  PLT 235 178 205 151    COAGS:  Recent Labs  08/24/15 0859  INR 1.09  APTT 28    BMP:  Recent Labs  08/18/15 1503 09/01/15 0906 09/08/15 0850 09/15/15 0926 09/22/15 0922  NA 136 133 131 130 131  K 3.5 3.8 3.8 3.6 3.5  CL 100 98  95* 92* 95*  CO2 24 30 31 30  32  GLUCOSE 176* 112 153* 132* 148*  BUN 19 20 23* 24* 21  CALCIUM 9.7 9.2 9.5 9.7 9.4  CREATININE 0.97 0.9 1.1 1.0 1.0  GFRNONAA 78  --   --   --   --   GFRAA 90  --   --   --   --     LIVER FUNCTION TESTS:  Recent Labs  09/01/15 0906 09/08/15 0850 09/15/15 0926 09/22/15 0922  BILITOT 0.80 0.70 0.70 0.70  AST 25 22 21 23   ALT 30 29 28  35  ALKPHOS 63 64 65 60  PROT 6.7 6.9 7.2 6.6  ALBUMIN 3.3 3.3 3.4 3.1*    TUMOR MARKERS: No results for input(s): AFPTM, CEA, CA199, CHROMGRNA in the last 8760 hours.  Assessment and Plan:  Tonsil cancer = undergoing Chemotherapy and radiation.   Dysphagia and taste changes secondary to radiation with subsequent weight loss  Will proceed with image guided placement of a gastrostomy tube today by Dr. Laurence Ferrari  Risks and Benefits discussed with the patient including, but not limited to the need for a barium enema during the procedure, bleeding, infection, peritonitis, or damage to adjacent structures.  All of the patient's questions were answered, patient is agreeable to proceed. Consent signed and in chart.  Thank you for this interesting consult.  I greatly enjoyed meeting Adnaan Mowad [facility] and look forward to participating in their care.  A copy of this report was sent to the requesting provider on  this date.  Electronically Signed: Murrell Redden PA-C 09/26/2015, 2:00 PM   I spent a total of  25 Minutes in face to face in clinical consultation, greater than 50% of which was counseling/coordinating care for gastrostomy tube

## 2015-09-26 NOTE — Progress Notes (Signed)
  Oncology Nurse Navigator Documentation  Navigator Location: [facility] (09/26/15 1005) Navigator Encounter Type: Treatment (09/26/15 1005) Telephone: Education (09/26/15 1005)             Barriers/Navigation Needs: Education (09/26/15 1005) Education: Preparing for Upcoming Surgery/ Treatment (09/26/15 1005) Interventions: Education Method (09/26/15 1005)     Education Method: Teach-back;Verbal;Demonstration (09/26/15 1005)       Met with Randy Hanson in Infusion where he was receiving IVF.  His wife was at the chairside.  He is having PEG placed this afternoon at [facility]. Using  PEG teaching device   and Teach Back, provided education for PEG use and care, including: hand hygiene, gravity bolus administration of daily water flushes, nutritional supplement, fluids and medications; care of tube insertion site including daily dressing change and cleaning; S&S of infection.  They correctly verbalized dressing change and cleaning procedures, provided correct return demonstration of gravity administration of water.   They understand they will receive a starter kit of supplies and case of nutritional supplement when they arrive to [place] Stay. They understand I will be available for ongoing PEG educational support.  Gayleen Orem, RN, BSN, [place] at [place] [phone]                  Time Spent with Patient: 60 (09/26/15 1005)

## 2015-09-26 NOTE — Procedures (Signed)
Interventional Radiology Procedure Note  Procedure: Placement of percutaneous 20F pull-through gastrostomy tube. Complications: None Recommendations: - NPO except for sips and chips remainder of today and overnight - Maintain G-tube to LWS until tomorrow morning  - May advance diet as tolerated and begin using tube tomorrow morning  Signed,  Roshard Rezabek K. Camauri Fleece, MD   

## 2015-09-26 NOTE — Patient Instructions (Signed)
Dehydration, Adult Dehydration is when you lose more fluids from the body than you take in. Vital organs like the kidneys, brain, and heart cannot function without a proper amount of fluids and salt. Any loss of fluids from the body can cause dehydration.  CAUSES   Vomiting.  Diarrhea.  Excessive sweating.  Excessive urine output.  Fever. SYMPTOMS  Mild dehydration  Thirst.  Dry lips.  Slightly dry mouth. Moderate dehydration  Very dry mouth.  Sunken eyes.  Skin does not bounce back quickly when lightly pinched and released.  Dark urine and decreased urine production.  Decreased tear production.  Headache. Severe dehydration  Very dry mouth.  Extreme thirst.  Rapid, weak pulse (more than 100 beats per minute at rest).  Cold hands and feet.  Not able to sweat in spite of heat and temperature.  Rapid breathing.  Blue lips.  Confusion and lethargy.  Difficulty being awakened.  Minimal urine production.  No tears. DIAGNOSIS  Your caregiver will diagnose dehydration based on your symptoms and your exam. Blood and urine tests will help confirm the diagnosis. The diagnostic evaluation should also identify the cause of dehydration. TREATMENT  Treatment of mild or moderate dehydration can often be done at home by increasing the amount of fluids that you drink. It is best to drink small amounts of fluid more often. Drinking too much at one time can make vomiting worse. Refer to the home care instructions below. Severe dehydration needs to be treated at the hospital where you will probably be given intravenous (IV) fluids that contain water and electrolytes. HOME CARE INSTRUCTIONS   Ask your caregiver about specific rehydration instructions.  Drink enough fluids to keep your urine clear or pale yellow.  Drink small amounts frequently if you have nausea and vomiting.  Eat as you normally do.  Avoid:  Foods or drinks high in sugar.  Carbonated  drinks.  Juice.  Extremely hot or cold fluids.  Drinks with caffeine.  Fatty, greasy foods.  Alcohol.  Tobacco.  Overeating.  Gelatin desserts.  Wash your hands well to avoid spreading bacteria and viruses.  Only take over-the-counter or prescription medicines for pain, discomfort, or fever as directed by your caregiver.  Ask your caregiver if you should continue all prescribed and over-the-counter medicines.  Keep all follow-up appointments with your caregiver. SEEK MEDICAL CARE IF:  You have abdominal pain and it increases or stays in one area (localizes).  You have a rash, stiff neck, or severe headache.  You are irritable, sleepy, or difficult to awaken.  You are weak, dizzy, or extremely thirsty. SEEK IMMEDIATE MEDICAL CARE IF:   You are unable to keep fluids down or you get worse despite treatment.  You have frequent episodes of vomiting or diarrhea.  You have blood or green matter (bile) in your vomit.  You have blood in your stool or your stool looks black and tarry.  You have not urinated in 6 to 8 hours, or you have only urinated a small amount of very dark urine.  You have a fever.  You faint. MAKE SURE YOU:   Understand these instructions.  Will watch your condition.  Will get help right away if you are not doing well or get worse. Document Released: 02/25/2005 Document Revised: 05/20/2011 Document Reviewed: 10/15/2010 Exit[employer] Patient Information 2015 [employer]. This information is not intended to replace advice given to you by your health care provider. Make sure you discuss any questions you have with your health care   provider.  

## 2015-09-26 NOTE — Progress Notes (Signed)
A user error has taken place: encounter opened in error, closed for administrative reasons.

## 2015-09-26 NOTE — Discharge Instructions (Signed)
Gastrostomy Tube Home Guide, Adult A gastrostomy tube is a tube that is surgically placed into the stomach. It is also called a "G-tube." G-tubes are used when a person is unable to eat and drink enough on their own to stay healthy. The tube is inserted into the stomach through a small cut (incision) in the skin. This tube is used for:  Feeding.  Giving medication. GASTROSTOMY TUBE CARE  Wash your hands with soap and water.  Remove the old dressing (if any). Some styles of G-tubes may need a dressing inserted between the skin and the G-tube. Other types of G-tubes do not require a dressing. Ask your health care provider if a dressing is needed.  Check the area where the tube enters the skin (insertion site) for redness, swelling, or pus-like (purulent) drainage. A small amount of clear or tan liquid drainage is normal. Check to make sure scar tissue (skin) is not growing around the insertion site. This could have a raised, bumpy appearance.  A cotton swab can be used to clean the skin around the tube:  When the G-tube is first put in, a normal saline solution or water can be used to clean the skin.  Mild soap and warm water can be used when the skin around the G-tube site has healed.  Roll the cotton swab around the G-tube insertion site to remove any drainage or crusting at the insertion site. STOMACH RESIDUALS Feeding tube residuals are the amount of liquids that are in the stomach at any given time. Residuals may be checked before giving feedings, medications, or as instructed by your health care provider.  Ask your health care provider if there are instances when you would not start tube feedings depending on the amount or type of contents withdrawn from the stomach.  Check residuals by attaching a syringe to the G-tube and pulling back on the syringe plunger. Note the amount, and return the residual back into the stomach. FLUSHING THE G-TUBE  The G-tube should be periodically  flushed with clean warm water to keep it from clogging.  Flush the G-tube after feedings or medications. Draw up 30 mL of warm water in a syringe. Connect the syringe to the G-tube and slowly push the water into the tube.  Do not push feedings, medications, or flushes rapidly. Flush the G-tube gently and slowly.  Only use syringes made for G-tubes to flush medications or feedings.  Your health care provider may want the G-tube flushed more often or with more water. If this is the case, follow your health care provider's instructions. FEEDINGS Your health care provider will determine whether feedings are given as a bolus (a certain amount given at one time and at scheduled times) or whether feedings will be given continuously on a feeding pump.   Formulas should be given at room temperature.  If feedings are continuous, no more than 4 hours worth of feedings should be placed in the feeding bag. This helps prevent spoilage or accidental excess infusion.  Cover and place unused formula in the refrigerator.  If feedings are continuous, stop the feedings when medications or flushes are given. Be sure to restart the feedings.  Feeding bags and syringes should be replaced as instructed by your health care provider. GIVING MEDICATION   In general, it is best if all medications are in a liquid form for G-tube administration. Liquid medications are less likely to clog the G-tube.  Mix the liquid medication with 30 mL (or amount recommended by  your health care provider) of warm water. °¨ Draw up the medication into the syringe. °¨ Attach the syringe to the G-tube and slowly push the mixture into the G-tube. °¨ After giving the medication, draw up 30 mL of warm water in the syringe and slowly flush the G-tube. °· For pills or capsules, check with your health care provider first before crushing medications. Some pills are not effective if they are crushed. Some capsules are sustained-release  medications. °¨ If appropriate, crush the pill or capsule and mix with 30 mL of warm water. Using the syringe, slowly push the medication through the tube, then flush the tube with another 30 mL of tap water. °G-TUBE PROBLEMS °G-tube was pulled out. °· Cause: May have been pulled out accidentally. °· Solutions: Cover the opening with clean dressing and tape. Call your health care provider right away. The G-tube should be put in as soon as possible (within 4 hours) so the G-tube opening (tract) does not close. The G-tube needs to be put in at a health care setting. An X-ray needs to be done to confirm placement before the G-tube can be used again. °Redness, irritation, soreness, or foul odor around the gastrostomy site. °· Cause: May be caused by leakage or infection. °· Solutions: Call your health care provider right away. °Large amount of leakage of fluid or mucus-like liquid present (a large amount means it soaks clothing). °· Cause: Many reasons could cause the G-tube to leak. °· Solutions: Call your health care provider to discuss the amount of leakage. °Skin or scar tissue appears to be growing where tube enters skin.  °· Cause: Tissue growth may develop around the insertion site if the G-tube is moved or pulled on excessively. °· Solutions: Secure tube with tape so that excess movement does not occur. Call your health care provider. °G-tube is clogged. °· Cause: Thick formula or medication. °· Solutions: Try to slowly push warm water into the tube with a large syringe. Never try to push any object into the tube to unclog it. Do not force fluid into the G-tube. If you are unable to unclog the tube, call your health care provider right away. °TIPS °· Head of bed (HOB) position refers to the upright position of a person's upper body. °¨ When giving medications or a feeding bolus, keep the HOB up as told by your health care provider. Do this during the feeding and for 1 hour after the feeding or medication  administration. °¨ If continuous feedings are being given, it is best to keep the HOB up as told by your health care provider. When ADLs (activities of daily living) are performed and the HOB needs to be flat, be sure to turn the feeding pump off. Restart the feeding pump when the HOB is returned to the recommended height. °· Do not pull or put tension on the tube. °· To prevent fluid backflow, kink the G-tube before removing the cap or disconnecting a syringe. °· Check the G-tube length every day. Measure from the insertion site to the end of the G-tube. If the length is longer than previous measurements, the tube may be coming out. Call your health care provider if you notice increasing G-tube length. °· Oral care, such as brushing teeth, must be continued. °· You may need to remove excess air (vent) from the G-tube. Your health care provider will tell you if this is needed. °· Always call your health care provider if you have questions or problems with the   G-tube. SEEK IMMEDIATE MEDICAL CARE IF:   You have severe abdominal pain, tenderness, or abdominal bloating (distension).  You have nausea or vomiting.  You are constipated or have problems moving your bowels.  The G-tube insertion site is red, swollen, has a foul smell, or has yellow or brown drainage.  You have difficulty breathing or shortness of breath.  You have a fever.  You have a large amount of feeding tube residuals.  The G-tube is clogged and cannot be flushed. MAKE SURE YOU:   Understand these instructions.  Will watch your condition.  Will get help right away if you are not doing well or get worse.   This information is not intended to replace advice given to you by your health care provider. Make sure you discuss any questions you have with your health care provider.   Document Released: 05/06/2001 Document Revised: 07/12/2014 Document Reviewed: 11/02/2012 Elsevier Interactive Patient Education 2016 Else[employer]  Moderate Conscious Sedation, Adult, Care After Refer to this sheet in the next few weeks. These instructions provide you with information on caring for yourself after your procedure. Your health care provider may also give you more specific instructions. Your treatment has been planned according to current medical practices, but problems sometimes occur. Call your health care provider if you have any problems or questions after your procedure. WHAT TO EXPECT AFTER THE PROCEDURE  After your procedure:  You may feel sleepy, clumsy, and have poor balance for several hours.  Vomiting may occur if you eat too soon after the procedure. HOME CARE INSTRUCTIONS  Do not participate in any activities where you could become injured for at least 24 hours. Do not:  Drive.  Swim.  Ride a bicycle.  Operate heavy machinery.  Cook.  Use power tools.  Climb ladders.  Work from a high place.  Do not make important decisions or sign legal documents until you are improved.  If you vomit, drink water, juice, or soup when you can drink without vomiting. Make sure you have little or no nausea before eating solid foods.  Only take over-the-counter or prescription medicines for pain, discomfort, or fever as directed by your health care provider.  Make sure you and your family fully understand everything about the medicines given to you, including what side effects may occur.  You should not drink alcohol, take sleeping pills, or take medicines that cause drowsiness for at least 24 hours.  If you smoke, do not smoke without supervision.  If you are feeling better, you may resume normal activities 24 hours after you were sedated.  Keep all appointments with your health care provider. SEEK MEDICAL CARE IF:  Your skin is pale or bluish in color.  You continue to feel nauseous or vomit.  Your pain is getting worse and is not helped by medicine.  You have bleeding or swelling.  You are  still sleepy or feeling clumsy after 24 hours. SEEK IMMEDIATE MEDICAL CARE IF:  You develop a rash.  You have difficulty breathing.  You develop any type of allergic problem.  You have a fever. MAKE SURE YOU:  Understand these instructions.  Will watch your condition.  Will get help right away if you are not doing well or get worse.   This information is not intended to replace advice given to you by your health care provider. Make sure you discuss any questions you have with your health care provider.   Document Released: 12/16/2012 Document Revised: 03/18/2014 Document Reviewed:  12/16/2012 Elsevier Interactive Patient Education [employer].

## 2015-09-26 NOTE — Progress Notes (Signed)
  Oncology Nurse Navigator Documentation  Navigator Location: [facility] (09/26/15 1400) Navigator Encounter Type: Other (09/26/15 1400)               Barriers/Navigation Needs: Education (09/26/15 1400) Education: Preparing for Upcoming Surgery/ Treatment (09/26/15 1400)       Met [name] [id number] where he was waiting for PEG placement.  His wife and daughter were at the bedside.  I delivered PEG supplies and case of Osmolite 1.5 delivered earlier to floor by [facility].  I went through supplies with wife. He confirmed he had adequate analgesic at home to address post-procedure discomfort. They know to call me with questions/concerns.  Gayleen Orem, RN, BSN, [place] at [place] [phone]                     Time Spent with Patient: 30 (09/26/15 1400)

## 2015-09-27 ENCOUNTER — Encounter: Payer: Self-pay | Admitting: *Deleted

## 2015-09-27 ENCOUNTER — Ambulatory Visit
Admission: RE | Admit: 2015-09-27 | Discharge: 2015-09-27 | Disposition: A | Discharge status: Home / Self Care | Payer: Non-veteran care | Source: Ambulatory Visit | Attending: Radiation Oncology | Admitting: Radiation Oncology

## 2015-09-27 ENCOUNTER — Telehealth: Payer: Self-pay | Admitting: *Deleted

## 2015-09-27 ENCOUNTER — Ambulatory Visit: Payer: Medicare Other | Admitting: Nutrition

## 2015-09-27 DIAGNOSIS — Z510 Encounter for antineoplastic radiation therapy: Secondary | ICD-10-CM | POA: Diagnosis not present

## 2015-09-27 DIAGNOSIS — C090 Malignant neoplasm of tonsillar fossa: Secondary | ICD-10-CM

## 2015-09-27 MED ORDER — OSMOLITE 1.5 CAL PO LIQD: ORAL | Status: DC

## 2015-09-27 NOTE — Progress Notes (Signed)
  Oncology Nurse Navigator Documentation  Navigator Location: [facility] (09/27/15 1120) Navigator Encounter Type: Treatment (09/27/15 1120)           Patient Visit Type: NETUYW (09/27/15 1120)       Met with Mr. Teale and his wife during this morning's [place] treatment to check on his well-being s/p his PEG placement yesterday afternoon.  He indicated he experienced significant discomfort last evening and through the HS but was able to manage with Oxycodone.  Mrs. Perri indicated that after changing the dressing this morning, his discomfort was notably relieved.  She suspects the pressure of the surgical dressing contributed to his pain.  I observed new dressing, appeared secure under PEG retention ring.  I noticed a trace amount of serosanguineous discharge at insertion site which I assured them was not unexpected.  He is securing PEG with mesh brief.  Mrs. [facility] stated she had not flushed PEG yet today, planned to later today following appointment.  I reeducated that PEG can be used for administration of crushable/dissovable/liquid medications should swallowing be too difficult.  I encouraged her to contact [facility] to determine which of his medications can be crushed or changed to liquid form.  I encouraged him to ambulate as tolerable to help with healing process. They understand I am available for continuing support.  Gayleen Orem, RN, BSN, [place] at [place] [phone]                               Time Spent with Patient: 45 (09/27/15 1120)

## 2015-09-27 NOTE — Progress Notes (Signed)
Nutrition follow-up completed with patient and wife status post radiation therapy for tonsil cancer. Patient is status post PEG placement on July 18. Patient reports he is unable to eat or drink very much and vomited on applesauce this morning. Food does not taste good to him and he is not able to eat. Weight decreased and documented as 214.7 pounds on July 17, down from 226 pounds February 2016. Patient has lost 7% of his body weight since June 30. Patient requires tube feeding to meet greater than 90% of estimated nutrition needs.  Estimated nutrition needs: 2400-2600 calories, 125-135 grams protein, 2.6 L fluid.  Nutrition diagnosis: Inadequate oral intake continues. Severe malnutrition continues.  Intervention:  Changed tube feedings to Osmolite 1.5 begin one can 4 times a day with 120 cc free water before and after bolus feedings. Patient will increase Osmolite 1.5 by one can every 2 days until goal rate of 7 cans achieved. Patient also will give 30 mL's ProMod twice a day via feeding tube mixed with 6 ounces of water.  Patient encouraged to flush feeding tube with 60 cc free water after ProMod infusion. Tube feeding plus ProMod at goal rate will provide 2685 cal, 124 g protein, 2707 mL free water. Instructions provided.  Teach back method used.  Questions were answered.  Monitoring, evaluation, goals: Patient will tolerate tube feeding advancement to meet greater than 90% estimated needs to minimize further weight loss.  Next visit: Wednesday, July 26 after radiation therapy.  Wife to contact me by phone with any questions or concerns.  **Disclaimer: This note was dictated with voice recognition software. Similar sounding words can inadvertently be transcribed and this note may contain transcription errors which may not have been corrected upon publication of note.**

## 2015-09-27 NOTE — Telephone Encounter (Signed)
Call received from patient's spouse requesting "dietician.  Feeding tube recently placed and we have an appointment with her today."  Call transferred to ext [phone].

## 2015-09-27 NOTE — Addendum Note (Signed)
Addended by: Ernestene Kiel L on: 09/27/2015 01:19 PM   Modules accepted: Orders

## 2015-09-28 ENCOUNTER — Ambulatory Visit
Admission: RE | Admit: 2015-09-28 | Discharge: 2015-09-28 | Disposition: A | Discharge status: Home / Self Care | Payer: Non-veteran care | Source: Ambulatory Visit | Attending: Radiation Oncology | Admitting: Radiation Oncology

## 2015-09-28 DIAGNOSIS — Z510 Encounter for antineoplastic radiation therapy: Secondary | ICD-10-CM | POA: Diagnosis not present

## 2015-09-29 ENCOUNTER — Ambulatory Visit
Admission: RE | Admit: 2015-09-29 | Discharge: 2015-09-29 | Disposition: A | Discharge status: Home / Self Care | Payer: Non-veteran care | Source: Ambulatory Visit | Attending: Radiation Oncology | Admitting: Radiation Oncology

## 2015-09-29 ENCOUNTER — Encounter: Payer: Self-pay | Admitting: Hematology & Oncology

## 2015-09-29 ENCOUNTER — Ambulatory Visit (HOSPITAL_BASED_OUTPATIENT_CLINIC_OR_DEPARTMENT_OTHER): Payer: Medicare Other

## 2015-09-29 ENCOUNTER — Other Ambulatory Visit (HOSPITAL_BASED_OUTPATIENT_CLINIC_OR_DEPARTMENT_OTHER): Payer: Medicare Other

## 2015-09-29 ENCOUNTER — Ambulatory Visit (HOSPITAL_BASED_OUTPATIENT_CLINIC_OR_DEPARTMENT_OTHER): Payer: Medicare Other | Admitting: Hematology & Oncology

## 2015-09-29 VITALS — BP 125/92 | HR 92 | Temp 98.1°F | Resp 16 | Ht 72.0 in | Wt 214.0 lb

## 2015-09-29 DIAGNOSIS — C099 Malignant neoplasm of tonsil, unspecified: Secondary | ICD-10-CM

## 2015-09-29 DIAGNOSIS — D72819 Decreased white blood cell count, unspecified: Secondary | ICD-10-CM | POA: Diagnosis not present

## 2015-09-29 DIAGNOSIS — C090 Malignant neoplasm of tonsillar fossa: Secondary | ICD-10-CM

## 2015-09-29 DIAGNOSIS — Z510 Encounter for antineoplastic radiation therapy: Secondary | ICD-10-CM | POA: Diagnosis not present

## 2015-09-29 DIAGNOSIS — Z5112 Encounter for antineoplastic immunotherapy: Secondary | ICD-10-CM

## 2015-09-29 LAB — BASIC METABOLIC PANEL - CANCER CENTER ONLY
BUN: 23 mg/dL — AB (ref 7–22)
CALCIUM: 9.5 mg/dL (ref 8.0–10.3)
CO2: 32 meq/L (ref 18–33)
Chloride: 91 mEq/L — ABNORMAL LOW (ref 98–108)
Creat: 1.1 mg/dl (ref 0.6–1.2)
GLUCOSE: 159 mg/dL — AB (ref 73–118)
Potassium: 3.4 mEq/L (ref 3.3–4.7)
SODIUM: 127 meq/L — AB (ref 128–145)

## 2015-09-29 LAB — CBC WITH DIFFERENTIAL (CANCER CENTER ONLY)
BASO#: 0.1 10*3/uL (ref 0.0–0.2)
BASO%: 2.1 % — ABNORMAL HIGH (ref 0.0–2.0)
EOS ABS: 0 10*3/uL (ref 0.0–0.5)
EOS%: 0.4 % (ref 0.0–7.0)
HEMATOCRIT: 34.5 % — AB (ref 38.7–49.9)
HEMOGLOBIN: 12.2 g/dL — AB (ref 13.0–17.1)
LYMPH#: 0.2 10*3/uL — AB (ref 0.9–3.3)
LYMPH%: 7.1 % — ABNORMAL LOW (ref 14.0–48.0)
MCH: 31.5 pg (ref 28.0–33.4)
MCHC: 35.4 g/dL (ref 32.0–35.9)
MCV: 89 fL (ref 82–98)
MONO#: 0.2 10*3/uL (ref 0.1–0.9)
MONO%: 8.7 % (ref 0.0–13.0)
NEUT%: 81.7 % — ABNORMAL HIGH (ref 40.0–80.0)
NEUTROS ABS: 2 10*3/uL (ref 1.5–6.5)
Platelets: 130 10*3/uL — ABNORMAL LOW (ref 145–400)
RBC: 3.87 10*6/uL — AB (ref 4.20–5.70)
RDW: 12.5 % (ref 11.1–15.7)
WBC: 2.4 10*3/uL — AB (ref 4.0–10.0)

## 2015-09-29 MED ORDER — SODIUM CHLORIDE 0.9 % IV SOLN
40.0000 mg/m2 | Freq: Once | INTRAVENOUS | Status: AC
  Administered 2015-09-29: 94 mg via INTRAVENOUS
  Filled 2015-09-29: qty 94

## 2015-09-29 MED ORDER — SODIUM CHLORIDE 0.9 % IV SOLN
Freq: Once | INTRAVENOUS | Status: AC
  Administered 2015-09-29: 10:00:00 via INTRAVENOUS

## 2015-09-29 MED ORDER — PALONOSETRON HCL INJECTION 0.25 MG/5ML
INTRAVENOUS | Status: AC
  Filled 2015-09-29: qty 5

## 2015-09-29 MED ORDER — POTASSIUM CHLORIDE 2 MEQ/ML IV SOLN
Freq: Once | INTRAVENOUS | Status: AC
  Administered 2015-09-29: 11:00:00 via INTRAVENOUS
  Filled 2015-09-29: qty 10

## 2015-09-29 MED ORDER — SODIUM CHLORIDE 0.9 % IV SOLN
Freq: Once | INTRAVENOUS | Status: AC
  Administered 2015-09-29: 13:00:00 via INTRAVENOUS
  Filled 2015-09-29: qty 5

## 2015-09-29 MED ORDER — PALONOSETRON HCL INJECTION 0.25 MG/5ML
0.2500 mg | Freq: Once | INTRAVENOUS | Status: AC
Start: 2015-09-29 — End: 2015-09-29
  Administered 2015-09-29: 0.25 mg via INTRAVENOUS

## 2015-09-29 MED ORDER — HEPARIN SOD (PORK) LOCK FLUSH 100 UNIT/ML IV SOLN
500.0000 [IU] | Freq: Once | INTRAVENOUS | Status: AC | PRN
  Administered 2015-09-29: 500 [IU]
  Filled 2015-09-29: qty 5

## 2015-09-29 MED ORDER — SODIUM CHLORIDE 0.9% FLUSH
10.0000 mL | INTRAVENOUS | Status: DC | PRN
  Administered 2015-09-29: 10 mL
  Filled 2015-09-29: qty 10

## 2015-09-29 NOTE — Progress Notes (Signed)
Hematology and Oncology Follow Up Visit  Randy Hanson [id number] 04-Jul-1940 75 y.o. 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 This is done on July 18. He is is having more difficulties swallowing. He had some discomfort when the feeding tube was placed. He is not using it. He says he is able to swallow a little bit.   He has some throat pain. He has a 11 more radiation treatments left..  He's done well with the chemotherapy. He's had very little in the way of nausea or vomiting. He's had no infections. He's had no rashes. He's had no leg swelling.   Overall, I think that he has done incredibly well. Eyes feel bad about the feeding tube. This will all be temporary.   Currently, his performance status is ECOG 1.    Medications:    Medication List       This list is accurate as of: 09/29/15 10:01 AM.  Always use your most recent med list.               aspirin 81 MG tablet  Take 81 mg by mouth daily.     b complex vitamins tablet  Take 1 tablet by mouth daily. Reported on 09/19/2015     cephALEXin 500 MG capsule  Commonly known as:  KEFLEX  Take 500 mg by mouth 2 (two) times daily.     dexamethasone 4 MG tablet  Commonly known as:  DECADRON  Take 2 tablets by mouth once a day on the day after chemotherapy and then take 2 tablets two times a day for 2 days. Take with food.     docusate sodium 100 MG capsule  Commonly known as:  COLACE  Take 100 mg by mouth 2 (two) times daily.     feeding supplement (OSMOLITE 1.5 CAL) Liqd  Begin Osmolite 1.5 or equivalent via PEG, 1 can QID with 120 cc free water before and after bolus. Increase by one can every 2 days until goal of 7 cans achieved. Give 30 mL promod or equivalent BID mixed with 6  oz water.  Flush with 60 cc free water after Promod.     Fish Oil 1000 MG Caps  Take 1 capsule by mouth daily. Reported on 09/25/2015     hydrochlorothiazide 25 MG tablet  Commonly known as:  HYDRODIURIL  Take 25 mg by mouth daily.     HYDROcodone-acetaminophen 5-325 MG tablet  Commonly known as:  NORCO/VICODIN     lidocaine 2 % solution  Commonly known as:  XYLOCAINE  Patient: Mix 1part 2% viscous lidocaine, 1part H20. Swish and/or swallow 10mL of this mixture, before meals and at bedtime, up to QID     lidocaine-prilocaine cream  Commonly known as:  EMLA  Apply to affected area as needed     LORazepam 0.5 MG tablet  Commonly known as:  ATIVAN  Take 1 tablet (0.5 mg total) by mouth every 6 (six) hours as needed (Nausea or vomiting).     losartan 50 MG tablet  Commonly known as:  COZAAR  Take 50 mg by mouth daily.     ondansetron 8 MG tablet  Commonly known as:  ZOFRAN  Take 1 tablet (8  mg total) by mouth 2 (two) times daily as needed. Start on the third day after chemotherapy.     oxyCODONE 5 MG/5ML solution  Commonly known as:  ROXICODONE  Take 7.5 mLs (7.5 mg total) by mouth every 6 (six) hours as needed for severe pain.     Pantothenic Acid 500 MG Tabs  Take 1 tablet by mouth daily. Reported on 09/25/2015     polyethylene glycol packet  Commonly known as:  MIRALAX / GLYCOLAX  Take 17 g by mouth daily.     prochlorperazine 10 MG tablet  Commonly known as:  COMPAZINE  Take 1 tablet (10 mg total) by mouth every 6 (six) hours as needed (Nausea or vomiting).     ranitidine 150 MG capsule  Commonly known as:  ZANTAC  Take 150 mg by mouth 2 (two) times daily.     SONAFINE  Apply 1 application topically 3 (three) times daily.     sucralfate 1 g tablet  Commonly known as:  CARAFATE  Dissolve 1 tablet in 10 mL H20 and swallow up to QID prn sore throat.     terazosin 2 MG capsule  Commonly known as:  HYTRIN  Take 2 mg by mouth at bedtime.         Allergies:  Allergies  Allergen Reactions  . Lisinopril Cough    Past Medical History, Surgical history, Social history, and Family History were reviewed and updated.  Review of Systems: All other 10 point review of systems is negative.   Physical Exam:  height is 6' (1.829 m) and weight is 214 lb (97.07 kg). His oral temperature is 98.1 F (36.7 C). His blood pressure is 125/92 and his pulse is 92. His respiration is 16.   Wt Readings from Last 3 Encounters:  09/29/15 214 lb (97.07 kg)  09/25/15 214 lb 11.2 oz (97.387 kg)  09/19/15 219 lb 8 oz (99.565 kg)    Head and neck exam shows no ocular lesions. He does have erythema and mucositis in the soft palate. His oral mucosa is somewhat dry. He has erythema on his neck. No obvious adenopathy is noted on the neck. Thyroid is nonpalpable. Lungs are clear. Cardiac exam regular rate and rhythm with no murmurs, rubs or bruits. Abdomen is soft. His feeding tube is in the upper left quadrant. This is intact. He has decent bowel sounds. He has some slight tenderness at the G-tube site. There is no palpable liver or spleen tip. Back exam shows no tenderness over the spine, ribs or hips. Extremities shows no clubbing, cyanosis or edema. Skin exam is slightly dry. Neurological exam shows no focal neurological deficits.   Lab Results  Component Value Date   WBC 2.4* 09/29/2015   HGB 12.2* 09/29/2015   HCT 34.5* 09/29/2015   MCV 89 09/29/2015   PLT 130* 09/29/2015   No results found for: FERRITIN, IRON, TIBC, UIBC, IRONPCTSAT Lab Results  Component Value Date   RBC 3.87* 09/29/2015   No results found for: KPAFRELGTCHN, LAMBDASER, KAPLAMBRATIO No results found for: IGGSERUM, IGA, IGMSERUM No results found for: Dorene Ar, A1GS, A2GS, Colin Benton, MSPIKE, SPEI   Chemistry      Component Value Date/Time   NA 131 09/22/2015 0922   NA 136 08/18/2015 1503   NA 139 07/12/2015 1145   K 3.5 09/22/2015 0922   K 3.5  08/18/2015 1503   K 4.1 07/12/2015 1145   CL 95* 09/22/2015 0922   CL 100 08/18/2015 1503  CO2 32 09/22/2015 0922   CO2 24 08/18/2015 1503   CO2 28 07/12/2015 1145   BUN 21 09/22/2015 0922   BUN 19 08/18/2015 1503   BUN 19.8 07/12/2015 1145   CREATININE 1.0 09/22/2015 0922   CREATININE 0.97 08/18/2015 1503   CREATININE 1.1 07/12/2015 1145      Component Value Date/Time   CALCIUM 9.4 09/22/2015 0922   CALCIUM 9.7 08/18/2015 1503   CALCIUM 10.0 07/12/2015 1145   ALKPHOS 60 09/22/2015 0922   ALKPHOS 71 08/18/2015 1503   ALKPHOS 61 07/12/2015 1145   AST 23 09/22/2015 0922   AST 18 08/18/2015 1503   AST 20 07/12/2015 1145   ALT 35 09/22/2015 0922   ALT 14 08/18/2015 1503   ALT 16 07/12/2015 1145   BILITOT 0.70 09/22/2015 0922   BILITOT <0.2 08/18/2015 1503   BILITOT 0.47 07/12/2015 1145     Impression and Plan: Mr. Calleros is a very pleasant 75 yo white male with stage I-HPV+ - squamous cell carcinoma the right tonsil. He has done well so far with chemotherapy and radiation. He does have a sore throat and neck.   His labs look okay. His white cell count is down a little bit but that really does not bother me all that much.  The adenopathy in the right side of his neck appears be doing better.  We will continue to give him IV fluids. I did this will be helpful for him.  We will plan to get him back in one week for follow-up.  I spent about 25 min. with he and his wife.  [name], MD 7/21/201710:01 AM

## 2015-09-29 NOTE — Patient Instructions (Signed)
[facility] Discharge Instructions for Patients Receiving Chemotherapy  Today you received the following chemotherapy agents:  Cisplatin  To help prevent nausea and vomiting after your treatment, we encourage you to take your nausea medications as directed. If you develop nausea and vomiting that is not controlled by your nausea medication, call the clinic.   BELOW ARE SYMPTOMS THAT SHOULD BE REPORTED IMMEDIATELY:  *FEVER GREATER THAN 100.5 F  *CHILLS WITH OR WITHOUT FEVER  NAUSEA AND VOMITING THAT IS NOT CONTROLLED WITH YOUR NAUSEA MEDICATION  *UNUSUAL SHORTNESS OF BREATH  *UNUSUAL BRUISING OR BLEEDING  TENDERNESS IN MOUTH AND THROAT WITH OR WITHOUT PRESENCE OF ULCERS  *URINARY PROBLEMS  *BOWEL PROBLEMS  UNUSUAL RASH Items with * indicate a potential emergency and should be followed up as soon as possible.  Feel free to call the clinic you have any questions or concerns. The clinic phone number is [phone].  Please show the [facility] at check-in to the Emergency Department and triage nurse. Cisplatin injection What is this medicine? CISPLATIN (SIS pla tin) is a chemotherapy drug. It targets fast dividing cells, like cancer cells, and causes these cells to die. This medicine is used to treat many types of cancer like bladder, ovarian, and testicular cancers. This medicine may be used for other purposes; ask your health care provider or pharmacist if you have questions. What should I tell my health care provider before I take this medicine? They need to know if you have any of these conditions: -blood disorders -hearing problems -kidney disease -recent or ongoing radiation therapy -an unusual or allergic reaction to cisplatin, carboplatin, other chemotherapy, other medicines, foods, dyes, or preservatives -pregnant or trying to get pregnant -breast-feeding How should I use this medicine? This drug is given as an infusion into a vein. It is  administered in a hospital or clinic by a specially trained health care professional. Talk to your pediatrician regarding the use of this medicine in children. Special care may be needed. Overdosage: If you think you have taken too much of this medicine contact a poison control center or emergency room at once. NOTE: This medicine is only for you. Do not share this medicine with others. What if I miss a dose? It is important not to miss a dose. Call your doctor or health care professional if you are unable to keep an appointment. What may interact with this medicine? -dofetilide -foscarnet -medicines for seizures -medicines to increase blood counts like filgrastim, pegfilgrastim, sargramostim -probenecid -pyridoxine used with altretamine -rituximab -some antibiotics like amikacin, gentamicin, neomycin, polymyxin B, streptomycin, tobramycin -sulfinpyrazone -vaccines -zalcitabine Talk to your doctor or health care professional before taking any of these medicines: -acetaminophen -aspirin -ibuprofen -ketoprofen -naproxen This list may not describe all possible interactions. Give your health care provider a list of all the medicines, herbs, non-prescription drugs, or dietary supplements you use. Also tell them if you smoke, drink alcohol, or use illegal drugs. Some items may interact with your medicine. What should I watch for while using this medicine? Your condition will be monitored carefully while you are receiving this medicine. You will need important blood work done while you are taking this medicine. This drug may make you feel generally unwell. This is not uncommon, as chemotherapy can affect healthy cells as well as cancer cells. Report any side effects. Continue your course of treatment even though you feel ill unless your doctor tells you to stop. In some cases, you may be given additional medicines  to help with side effects. Follow all directions for their use. Call your doctor  or health care professional for advice if you get a fever, chills or sore throat, or other symptoms of a cold or flu. Do not treat yourself. This drug decreases your body's ability to fight infections. Try to avoid being around people who are sick. This medicine may increase your risk to bruise or bleed. Call your doctor or health care professional if you notice any unusual bleeding. Be careful brushing and flossing your teeth or using a toothpick because you may get an infection or bleed more easily. If you have any dental work done, tell your dentist you are receiving this medicine. Avoid taking products that contain aspirin, acetaminophen, ibuprofen, naproxen, or ketoprofen unless instructed by your doctor. These medicines may hide a fever. Do not become pregnant while taking this medicine. Women should inform their doctor if they wish to become pregnant or think they might be pregnant. There is a potential for serious side effects to an unborn child. Talk to your health care professional or pharmacist for more information. Do not breast-feed an infant while taking this medicine. Drink fluids as directed while you are taking this medicine. This will help protect your kidneys. Call your doctor or health care professional if you get diarrhea. Do not treat yourself. What side effects may I notice from receiving this medicine? Side effects that you should report to your doctor or health care professional as soon as possible: -allergic reactions like skin rash, itching or hives, swelling of the face, lips, or tongue -signs of infection - fever or chills, cough, sore throat, pain or difficulty passing urine -signs of decreased platelets or bleeding - bruising, pinpoint red spots on the skin, black, tarry stools, nosebleeds -signs of decreased red blood cells - unusually weak or tired, fainting spells, lightheadedness -breathing problems -changes in hearing -gout pain -low blood counts - This drug may  decrease the number of white blood cells, red blood cells and platelets. You may be at increased risk for infections and bleeding. -nausea and vomiting -pain, swelling, redness or irritation at the injection site -pain, tingling, numbness in the hands or feet -problems with balance, movement -trouble passing urine or change in the amount of urine Side effects that usually do not require medical attention (report to your doctor or health care professional if they continue or are bothersome): -changes in vision -loss of appetite -metallic taste in the mouth or changes in taste This list may not describe all possible side effects. Call your doctor for medical advice about side effects. You may report side effects to FDA at 1-800-FDA-1088. Where should I keep my medicine? This drug is given in a hospital or clinic and will not be stored at home. NOTE: This sheet is a summary. It may not cover all possible information. If you have questions about this medicine, talk to your doctor, pharmacist, or health care provider.    2016, Elsevier/Gold Standard. (2007-06-02 14:40:54)    

## 2015-10-02 ENCOUNTER — Ambulatory Visit
Admission: RE | Admit: 2015-10-02 | Discharge: 2015-10-02 | Disposition: A | Discharge status: Home / Self Care | Payer: No Typology Code available for payment source | Source: Ambulatory Visit | Attending: Radiation Oncology | Admitting: Radiation Oncology

## 2015-10-02 ENCOUNTER — Encounter: Payer: Self-pay | Admitting: Radiation Oncology

## 2015-10-02 ENCOUNTER — Ambulatory Visit
Admission: RE | Admit: 2015-10-02 | Discharge: 2015-10-02 | Disposition: A | Discharge status: Home / Self Care | Payer: Non-veteran care | Source: Ambulatory Visit | Attending: Radiation Oncology | Admitting: Radiation Oncology

## 2015-10-02 VITALS — BP 90/61 | HR 97 | Temp 98.4°F | Ht 72.0 in | Wt 212.0 lb

## 2015-10-02 DIAGNOSIS — C090 Malignant neoplasm of tonsillar fossa: Secondary | ICD-10-CM

## 2015-10-02 DIAGNOSIS — Z510 Encounter for antineoplastic radiation therapy: Secondary | ICD-10-CM | POA: Diagnosis not present

## 2015-10-02 NOTE — Progress Notes (Signed)
Weekly Management Note:  Outpatient    ICD-9-CM ICD-10-CM   1. Carcinoma of tonsillar fossa (HCC) 146.1 C09.0     Current Dose:  50 Gy  Projected Dose: 70 Gy      Narrative:  The patient presents for routine under treatment assessment.  CBCT/MVCT images/Port film x-rays were reviewed.  The chart was checked.  He reports level 6/10 pain when swallowing and reports thickened saliva for which he gargles with Baking soda and Salt rinse with short results.     He reports that he receives IV hydration Tuesdays an Fridays.  Oral mucosa intact and note stringy saliva. He administers 4 cans of Osmolite, but states he is stepping it up by 1/2 can today. Also flushes his PEG tube with 2 full 60 cc/water with each tube feeding.     BP 103/68 (BP Location: Right Arm, Patient Position: Sitting, Cuff Size: Large)   Pulse 74   Temp 98.4 F (36.9 C)   Ht 6' (1.829 m)   Wt 212 lb (96.2 kg)   SpO2 98%   BMI 28.75 kg/m  - Sitting   BP 90/61 Comment: standing  Pulse 97   Temp 98.4 F (36.9 C)   Ht 6' (1.829 m)   Wt 212 lb (96.2 kg)   SpO2 98%   BMI 28.75 kg/m - Standing Physical Findings:  Wt Readings from Last 3 Encounters:  10/02/15 212 lb (96.2 kg)  09/29/15 214 lb (97.1 kg)  09/25/15 214 lb 11.2 oz (97.4 kg)    height is 6' (1.829 m) and weight is 212 lb (96.2 kg). His temperature is 98.4 F (36.9 C). His blood pressure is 90/61 and his pulse is 97. His oxygen saturation is 98%.    Patchy mucositis and erythema in his oropharynx but no signs of thrush. Skin over neck is slightly hyperpigmented and dry   CBC    Component Value Date/Time   WBC 2.4 (L) 09/29/2015 0908   WBC 3.5 (L) 09/26/2015 1415   RBC 3.87 (L) 09/29/2015 0908   RBC 3.91 (L) 09/26/2015 1415   HGB 12.2 (L) 09/29/2015 0908   HCT 34.5 (L) 09/29/2015 0908   PLT 130 (L) 09/29/2015 0908   MCV 89 09/29/2015 0908   MCH 31.5 09/29/2015 0908   MCH 31.2 09/26/2015 1415   MCHC 35.4 09/29/2015 0908   MCHC 35.7 09/26/2015  1415   RDW 12.5 09/29/2015 0908   LYMPHSABS 0.2 (L) 09/29/2015 0908   MONOABS 0.4 08/24/2015 0859   EOSABS 0.0 09/29/2015 0908   BASOSABS 0.1 09/29/2015 0908     CMP     Component Value Date/Time   NA 127 (L) 09/29/2015 0908   NA 139 07/12/2015 1145   K 3.4 09/29/2015 0908   K 4.1 07/12/2015 1145   CL 91 (L) 09/29/2015 0908   CO2 32 09/29/2015 0908   CO2 28 07/12/2015 1145   GLUCOSE 159 (H) 09/29/2015 0908   BUN 23 (H) 09/29/2015 0908   BUN 19.8 07/12/2015 1145   CREATININE 1.1 09/29/2015 0908   CREATININE 1.1 07/12/2015 1145   CALCIUM 9.5 09/29/2015 0908   CALCIUM 10.0 07/12/2015 1145   PROT 6.6 09/22/2015 0922   PROT 7.2 07/12/2015 1145   ALBUMIN 3.1 (L) 09/22/2015 0922   ALBUMIN 4.2 08/18/2015 1503   ALBUMIN 3.9 07/12/2015 1145   AST 23 09/22/2015 0922   AST 20 07/12/2015 1145   ALT 35 09/22/2015 0922   ALT 16 07/12/2015 1145   ALKPHOS 60  09/22/2015 0922   ALKPHOS 61 07/12/2015 1145   BILITOT 0.70 09/22/2015 0922   BILITOT 0.47 07/12/2015 1145   GFRNONAA 78 08/18/2015 1503   GFRAA 90 08/18/2015 1503    Impression:  The patient is tolerating radiotherapy.     Plan:  Continue radiotherapy as planned. He was offered options of IV fluids today vs pushing fluids in peg and by PO until IV fluids tomorrow. He chooses to push fluids by mouth and PEG today.  He is mildly orthostatic, will rise from chair with caution.  -----------------------------------  Eppie Gibson, MD

## 2015-10-03 ENCOUNTER — Ambulatory Visit
Admission: RE | Admit: 2015-10-03 | Discharge: 2015-10-03 | Disposition: A | Discharge status: Home / Self Care | Payer: Non-veteran care | Source: Ambulatory Visit | Attending: Radiation Oncology | Admitting: Radiation Oncology

## 2015-10-03 ENCOUNTER — Ambulatory Visit (HOSPITAL_BASED_OUTPATIENT_CLINIC_OR_DEPARTMENT_OTHER): Payer: Medicare Other

## 2015-10-03 VITALS — BP 111/63 | HR 58 | Temp 98.6°F | Resp 16

## 2015-10-03 DIAGNOSIS — Z510 Encounter for antineoplastic radiation therapy: Secondary | ICD-10-CM | POA: Diagnosis not present

## 2015-10-03 DIAGNOSIS — C099 Malignant neoplasm of tonsil, unspecified: Secondary | ICD-10-CM

## 2015-10-03 DIAGNOSIS — C090 Malignant neoplasm of tonsillar fossa: Secondary | ICD-10-CM

## 2015-10-03 MED ORDER — SODIUM CHLORIDE 0.9 % IV SOLN
Freq: Once | INTRAVENOUS | Status: AC
  Administered 2015-10-03: 09:00:00 via INTRAVENOUS

## 2015-10-03 MED ORDER — SODIUM CHLORIDE 0.9 % IJ SOLN
10.0000 mL | INTRAMUSCULAR | Status: DC | PRN
  Administered 2015-10-03: 10 mL
  Filled 2015-10-03: qty 10

## 2015-10-03 MED ORDER — HEPARIN SOD (PORK) LOCK FLUSH 100 UNIT/ML IV SOLN
500.0000 [IU] | Freq: Once | INTRAVENOUS | Status: AC | PRN
  Administered 2015-10-03: 500 [IU]
  Filled 2015-10-03: qty 5

## 2015-10-03 NOTE — Patient Instructions (Signed)

## 2015-10-04 ENCOUNTER — Ambulatory Visit
Admission: RE | Admit: 2015-10-04 | Discharge: 2015-10-04 | Disposition: A | Discharge status: Home / Self Care | Payer: Non-veteran care | Source: Ambulatory Visit | Attending: Radiation Oncology | Admitting: Radiation Oncology

## 2015-10-04 ENCOUNTER — Ambulatory Visit: Payer: Medicare Other | Admitting: Nutrition

## 2015-10-04 DIAGNOSIS — Z510 Encounter for antineoplastic radiation therapy: Secondary | ICD-10-CM | POA: Diagnosis not present

## 2015-10-04 NOTE — Progress Notes (Signed)
Nutrition follow-up completed with patient and wife status post radiation therapy for tonsil cancer. Patient reports tolerating 2 cans Osmolite 1.5 4 times a day with 120 cc free water before and after feeding. Patient denies tolerance issues. Patient unable to eat by mouth.  Patient is drinking water by mouth.  However cannot tell me how much he is tolerating. Weight declined and documented as 212 pounds on July 24, down from 214.7 pounds July 17. Patient is not tolerating protein supplement.  Nutrition diagnosis:  Inadequate oral intake continues. Severe malnutrition continues.  Estimated nutrition needs: 2400-2600 calories, 125-135 grams protein, 2.6 L fluid.  Intervention: Educated patient to continue Osmolite 1.5 - 2 cans 4 times a day which will provide 2840 cal, 119.2 g protein, and approximately 2408 mL free water.   Recommended patient hold protein supplement for now and we will reevaluate at a later date. Questions were answered and teach back method used.  Monitoring, evaluation, goals:  Patient will tolerate tube feeding at goal rate to meet greater than 90% of estimated nutrition needs.  Next visit: Wednesday, August 2, after radiation therapy.  **Disclaimer: This note was dictated with voice recognition software. Similar sounding words can inadvertently be transcribed and this note may contain transcription errors which may not have been corrected upon publication of note.**

## 2015-10-05 ENCOUNTER — Ambulatory Visit
Admission: RE | Admit: 2015-10-05 | Discharge: 2015-10-05 | Disposition: A | Discharge status: Home / Self Care | Payer: Non-veteran care | Source: Ambulatory Visit | Attending: Radiation Oncology | Admitting: Radiation Oncology

## 2015-10-05 DIAGNOSIS — Z510 Encounter for antineoplastic radiation therapy: Secondary | ICD-10-CM | POA: Diagnosis not present

## 2015-10-06 ENCOUNTER — Other Ambulatory Visit: Payer: Self-pay

## 2015-10-06 ENCOUNTER — Other Ambulatory Visit: Payer: Self-pay | Admitting: Hematology & Oncology

## 2015-10-06 ENCOUNTER — Ambulatory Visit (HOSPITAL_BASED_OUTPATIENT_CLINIC_OR_DEPARTMENT_OTHER): Payer: Medicare Other

## 2015-10-06 ENCOUNTER — Other Ambulatory Visit (HOSPITAL_BASED_OUTPATIENT_CLINIC_OR_DEPARTMENT_OTHER): Payer: Medicare Other

## 2015-10-06 ENCOUNTER — Ambulatory Visit
Admission: RE | Admit: 2015-10-06 | Discharge: 2015-10-06 | Disposition: A | Discharge status: Home / Self Care | Payer: Non-veteran care | Source: Ambulatory Visit | Attending: Radiation Oncology | Admitting: Radiation Oncology

## 2015-10-06 VITALS — BP 138/78 | HR 81 | Temp 98.2°F | Resp 20 | Wt 218.5 lb

## 2015-10-06 DIAGNOSIS — Z5111 Encounter for antineoplastic chemotherapy: Secondary | ICD-10-CM

## 2015-10-06 DIAGNOSIS — M542 Cervicalgia: Secondary | ICD-10-CM

## 2015-10-06 DIAGNOSIS — C099 Malignant neoplasm of tonsil, unspecified: Secondary | ICD-10-CM | POA: Diagnosis not present

## 2015-10-06 DIAGNOSIS — C090 Malignant neoplasm of tonsillar fossa: Secondary | ICD-10-CM

## 2015-10-06 DIAGNOSIS — Z510 Encounter for antineoplastic radiation therapy: Secondary | ICD-10-CM | POA: Diagnosis not present

## 2015-10-06 LAB — CMP (CANCER CENTER ONLY)
ALBUMIN: 2.9 g/dL — AB (ref 3.3–5.5)
ALK PHOS: 63 U/L (ref 26–84)
ALT: 31 U/L (ref 10–47)
AST: 23 U/L (ref 11–38)
BILIRUBIN TOTAL: 0.5 mg/dL (ref 0.20–1.60)
BUN, Bld: 23 mg/dL — ABNORMAL HIGH (ref 7–22)
CALCIUM: 8.8 mg/dL (ref 8.0–10.3)
CO2: 32 meq/L (ref 18–33)
CREATININE: 0.9 mg/dL (ref 0.6–1.2)
Chloride: 98 mEq/L (ref 98–108)
GLUCOSE: 138 mg/dL — AB (ref 73–118)
Potassium: 4.1 mEq/L (ref 3.3–4.7)
SODIUM: 134 meq/L (ref 128–145)
Total Protein: 5.8 g/dL — ABNORMAL LOW (ref 6.4–8.1)

## 2015-10-06 LAB — CBC WITH DIFFERENTIAL (CANCER CENTER ONLY)
BASO#: 0 10*3/uL (ref 0.0–0.2)
BASO%: 1.2 % (ref 0.0–2.0)
EOS%: 0.8 % (ref 0.0–7.0)
Eosinophils Absolute: 0 10*3/uL (ref 0.0–0.5)
HEMATOCRIT: 32.1 % — AB (ref 38.7–49.9)
HEMOGLOBIN: 11.3 g/dL — AB (ref 13.0–17.1)
LYMPH#: 0.2 10*3/uL — AB (ref 0.9–3.3)
LYMPH%: 6.8 % — ABNORMAL LOW (ref 14.0–48.0)
MCH: 32 pg (ref 28.0–33.4)
MCHC: 35.2 g/dL (ref 32.0–35.9)
MCV: 91 fL (ref 82–98)
MONO#: 0.2 10*3/uL (ref 0.1–0.9)
MONO%: 8.8 % (ref 0.0–13.0)
NEUT%: 82.4 % — ABNORMAL HIGH (ref 40.0–80.0)
NEUTROS ABS: 2.1 10*3/uL (ref 1.5–6.5)
Platelets: 123 10*3/uL — ABNORMAL LOW (ref 145–400)
RBC: 3.53 10*6/uL — ABNORMAL LOW (ref 4.20–5.70)
RDW: 12.9 % (ref 11.1–15.7)
WBC: 2.5 10*3/uL — ABNORMAL LOW (ref 4.0–10.0)

## 2015-10-06 MED ORDER — POTASSIUM CHLORIDE 2 MEQ/ML IV SOLN
Freq: Once | INTRAVENOUS | Status: AC
  Administered 2015-10-06: 11:00:00 via INTRAVENOUS
  Filled 2015-10-06: qty 10

## 2015-10-06 MED ORDER — SODIUM CHLORIDE 0.9 % IV SOLN
Freq: Once | INTRAVENOUS | Status: AC
  Administered 2015-10-06: 10:00:00 via INTRAVENOUS

## 2015-10-06 MED ORDER — SODIUM CHLORIDE 0.9 % IV SOLN
40.0000 mg/m2 | Freq: Once | INTRAVENOUS | Status: AC
  Administered 2015-10-06: 94 mg via INTRAVENOUS
  Filled 2015-10-06: qty 94

## 2015-10-06 MED ORDER — PALONOSETRON HCL INJECTION 0.25 MG/5ML
INTRAVENOUS | Status: AC
  Filled 2015-10-06: qty 5

## 2015-10-06 MED ORDER — PALONOSETRON HCL INJECTION 0.25 MG/5ML
0.2500 mg | Freq: Once | INTRAVENOUS | Status: AC
  Administered 2015-10-06: 0.25 mg via INTRAVENOUS

## 2015-10-06 MED ORDER — SODIUM CHLORIDE 0.9 % IV SOLN
Freq: Once | INTRAVENOUS | Status: AC
  Administered 2015-10-06: 12:00:00 via INTRAVENOUS
  Filled 2015-10-06: qty 5

## 2015-10-06 MED ORDER — HEPARIN SOD (PORK) LOCK FLUSH 100 UNIT/ML IV SOLN
500.0000 [IU] | Freq: Once | INTRAVENOUS | Status: AC | PRN
  Administered 2015-10-06: 500 [IU]
  Filled 2015-10-06: qty 5

## 2015-10-06 MED ORDER — SODIUM CHLORIDE 0.9% FLUSH
10.0000 mL | INTRAVENOUS | Status: DC | PRN
  Administered 2015-10-06: 10 mL
  Filled 2015-10-06: qty 10

## 2015-10-06 MED ORDER — GELCLAIR MT GEL: 1.0000 | Freq: Three times a day (TID) | OROMUCOSAL | 3 refills | Status: DC

## 2015-10-06 NOTE — Patient Instructions (Signed)
Cisplatin injection What is this medicine? CISPLATIN (SIS pla tin) is a chemotherapy drug. It targets fast dividing cells, like cancer cells, and causes these cells to die. This medicine is used to treat many types of cancer like bladder, ovarian, and testicular cancers. This medicine may be used for other purposes; ask your health care provider or pharmacist if you have questions. What should I tell my health care provider before I take this medicine? They need to know if you have any of these conditions: -blood disorders -hearing problems -kidney disease -recent or ongoing radiation therapy -an unusual or allergic reaction to cisplatin, carboplatin, other chemotherapy, other medicines, foods, dyes, or preservatives -pregnant or trying to get pregnant -breast-feeding How should I use this medicine? This drug is given as an infusion into a vein. It is administered in a hospital or clinic by a specially trained health care professional. Talk to your pediatrician regarding the use of this medicine in children. Special care may be needed. Overdosage: If you think you have taken too much of this medicine contact a poison control center or emergency room at once. NOTE: This medicine is only for you. Do not share this medicine with others. What if I miss a dose? It is important not to miss a dose. Call your doctor or health care professional if you are unable to keep an appointment. What may interact with this medicine? -dofetilide -foscarnet -medicines for seizures -medicines to increase blood counts like filgrastim, pegfilgrastim, sargramostim -probenecid -pyridoxine used with altretamine -rituximab -some antibiotics like amikacin, gentamicin, neomycin, polymyxin B, streptomycin, tobramycin -sulfinpyrazone -vaccines -zalcitabine Talk to your doctor or health care professional before taking any of these medicines: -acetaminophen -aspirin -ibuprofen -ketoprofen -naproxen This list may  not describe all possible interactions. Give your health care provider a list of all the medicines, herbs, non-prescription drugs, or dietary supplements you use. Also tell them if you smoke, drink alcohol, or use illegal drugs. Some items may interact with your medicine. What should I watch for while using this medicine? Your condition will be monitored carefully while you are receiving this medicine. You will need important blood work done while you are taking this medicine. This drug may make you feel generally unwell. This is not uncommon, as chemotherapy can affect healthy cells as well as cancer cells. Report any side effects. Continue your course of treatment even though you feel ill unless your doctor tells you to stop. In some cases, you may be given additional medicines to help with side effects. Follow all directions for their use. Call your doctor or health care professional for advice if you get a fever, chills or sore throat, or other symptoms of a cold or flu. Do not treat yourself. This drug decreases your body's ability to fight infections. Try to avoid being around people who are sick. This medicine may increase your risk to bruise or bleed. Call your doctor or health care professional if you notice any unusual bleeding. Be careful brushing and flossing your teeth or using a toothpick because you may get an infection or bleed more easily. If you have any dental work done, tell your dentist you are receiving this medicine. Avoid taking products that contain aspirin, acetaminophen, ibuprofen, naproxen, or ketoprofen unless instructed by your doctor. These medicines may hide a fever. Do not become pregnant while taking this medicine. Women should inform their doctor if they wish to become pregnant or think they might be pregnant. There is a potential for serious side effects to   an unborn child. Talk to your health care professional or pharmacist for more information. Do not breast-feed an  infant while taking this medicine. Drink fluids as directed while you are taking this medicine. This will help protect your kidneys. Call your doctor or health care professional if you get diarrhea. Do not treat yourself. What side effects may I notice from receiving this medicine? Side effects that you should report to your doctor or health care professional as soon as possible: -allergic reactions like skin rash, itching or hives, swelling of the face, lips, or tongue -signs of infection - fever or chills, cough, sore throat, pain or difficulty passing urine -signs of decreased platelets or bleeding - bruising, pinpoint red spots on the skin, black, tarry stools, nosebleeds -signs of decreased red blood cells - unusually weak or tired, fainting spells, lightheadedness -breathing problems -changes in hearing -gout pain -low blood counts - This drug may decrease the number of white blood cells, red blood cells and platelets. You may be at increased risk for infections and bleeding. -nausea and vomiting -pain, swelling, redness or irritation at the injection site -pain, tingling, numbness in the hands or feet -problems with balance, movement -trouble passing urine or change in the amount of urine Side effects that usually do not require medical attention (report to your doctor or health care professional if they continue or are bothersome): -changes in vision -loss of appetite -metallic taste in the mouth or changes in taste This list may not describe all possible side effects. Call your doctor for medical advice about side effects. You may report side effects to FDA at 1-800-FDA-1088. Where should I keep my medicine? This drug is given in a hospital or clinic and will not be stored at home. NOTE: This sheet is a summary. It may not cover all possible information. If you have questions about this medicine, talk to your doctor, pharmacist, or health care provider.    2016, [employer]  Standard. (2007-06-02 14:40:54)  

## 2015-10-06 NOTE — Progress Notes (Signed)
3:25 PM' Will call pt for follow up appointment.

## 2015-10-09 ENCOUNTER — Ambulatory Visit
Admission: RE | Admit: 2015-10-09 | Discharge: 2015-10-09 | Disposition: A | Discharge status: Home / Self Care | Payer: Non-veteran care | Source: Ambulatory Visit | Attending: Radiation Oncology | Admitting: Radiation Oncology

## 2015-10-09 ENCOUNTER — Ambulatory Visit
Admission: RE | Admit: 2015-10-09 | Discharge: 2015-10-09 | Disposition: A | Discharge status: Home / Self Care | Payer: No Typology Code available for payment source | Source: Ambulatory Visit | Attending: Radiation Oncology | Admitting: Radiation Oncology

## 2015-10-09 ENCOUNTER — Encounter: Payer: Self-pay | Admitting: Radiation Oncology

## 2015-10-09 VITALS — Temp 98.1°F | Ht 72.0 in | Wt 218.0 lb

## 2015-10-09 DIAGNOSIS — C090 Malignant neoplasm of tonsillar fossa: Secondary | ICD-10-CM | POA: Diagnosis present

## 2015-10-09 DIAGNOSIS — Z510 Encounter for antineoplastic radiation therapy: Secondary | ICD-10-CM | POA: Diagnosis not present

## 2015-10-09 DIAGNOSIS — E785 Hyperlipidemia, unspecified: Secondary | ICD-10-CM | POA: Diagnosis not present

## 2015-10-09 DIAGNOSIS — E7800 Pure hypercholesterolemia, unspecified: Secondary | ICD-10-CM | POA: Diagnosis not present

## 2015-10-09 DIAGNOSIS — I10 Essential (primary) hypertension: Secondary | ICD-10-CM | POA: Diagnosis not present

## 2015-10-09 DIAGNOSIS — K219 Gastro-esophageal reflux disease without esophagitis: Secondary | ICD-10-CM | POA: Diagnosis not present

## 2015-10-09 NOTE — Progress Notes (Signed)
Weekly Management Note:  Outpatient    ICD-9-CM ICD-10-CM   1. Carcinoma of tonsillar fossa (HCC) 146.1 C09.0     Current Dose: 60 Gy  Projected Dose: 70 Gy      Narrative:  The patient presents for routine under treatment assessment.  CBCT/MVCT images/Port film x-rays were reviewed.  The chart was checked.  Randy Hanson is here for his 30th fraction of radiation to his Right Tonsil/ Bilateral Neck. He denies pain except some throat which he will occasionally take pain medicine for several times a week. He is using his feeding tube to instill 2 cans of Osmolite four times a day. He is also instilling about 960 cc's of water daily. He is only drinking water orally. He reports a dry throat with thick saliva. He is having normal daily bowel movements. He is to see Dr. Marin Olp tomorrow for IV Fluids. He is using the sonafine cream twice daily to his neck area.   BP 103/68 (BP Location: Right Arm, Patient Position: Sitting, Cuff Size: Large)   Pulse 74   Temp 98.4 F (36.9 C)   Ht 6' (1.829 m)   Wt 212 lb (96.2 kg)   SpO2 98%   BMI 28.75 kg/m  - Sitting   BP 90/61 Comment: standing  Pulse 97   Temp 98.4 F (36.9 C)   Ht 6' (1.829 m)   Wt 212 lb (96.2 kg)   SpO2 98%   BMI 28.75 kg/m - Standing  Physical Findings:  Wt Readings from Last 3 Encounters:  10/09/15 218 lb (98.9 kg)  10/06/15 218 lb 8 oz (99.1 kg)  10/02/15 212 lb (96.2 kg)    height is 6' (1.829 m) and weight is 218 lb (98.9 kg). His temperature is 98.1 F (36.7 C). His oxygen saturation is 98%.    Patchy mucositis and erythema in his oropharynx but no signs of thrush. Skin over neck is erythematous/ hyperpigmented and dry with superficial dry peeling.  CBC    Component Value Date/Time   WBC 2.5 (L) 10/06/2015 0904   WBC 3.5 (L) 09/26/2015 1415   RBC 3.53 (L) 10/06/2015 0904   RBC 3.91 (L) 09/26/2015 1415   HGB 11.3 (L) 10/06/2015 0904   HCT 32.1 (L) 10/06/2015 0904   PLT 123 (L) 10/06/2015 0904   MCV 91  10/06/2015 0904   MCH 32.0 10/06/2015 0904   MCH 31.2 09/26/2015 1415   MCHC 35.2 10/06/2015 0904   MCHC 35.7 09/26/2015 1415   RDW 12.9 10/06/2015 0904   LYMPHSABS 0.2 (L) 10/06/2015 0904   MONOABS 0.4 08/24/2015 0859   EOSABS 0.0 10/06/2015 0904   BASOSABS 0.0 10/06/2015 0904     CMP     Component Value Date/Time   NA 134 10/06/2015 0904   NA 139 07/12/2015 1145   K 4.1 10/06/2015 0904   K 4.1 07/12/2015 1145   CL 98 10/06/2015 0904   CO2 32 10/06/2015 0904   CO2 28 07/12/2015 1145   GLUCOSE 138 (H) 10/06/2015 0904   BUN 23 (H) 10/06/2015 0904   BUN 19.8 07/12/2015 1145   CREATININE 0.9 10/06/2015 0904   CREATININE 1.1 07/12/2015 1145   CALCIUM 8.8 10/06/2015 0904   CALCIUM 10.0 07/12/2015 1145   PROT 5.8 (L) 10/06/2015 0904   PROT 7.2 07/12/2015 1145   ALBUMIN 2.9 (L) 10/06/2015 0904   ALBUMIN 4.2 08/18/2015 1503   ALBUMIN 3.9 07/12/2015 1145   AST 23 10/06/2015 0904   AST 20 07/12/2015 1145  ALT 31 10/06/2015 0904   ALT 16 07/12/2015 1145   ALKPHOS 63 10/06/2015 0904   ALKPHOS 61 07/12/2015 1145   BILITOT 0.50 10/06/2015 0904   BILITOT 0.47 07/12/2015 1145   GFRNONAA 78 08/18/2015 1503   GFRAA 90 08/18/2015 1503    Impression:  The patient is tolerating radiotherapy.     Plan: Pleased he is gaining weight. Continue radiotherapy as planned.  Discussed meds to use including carafate, lidocaine for throat.  IV fluids tomorrow.  Suggested scheduling more while recovering from ChRT.  -----------------------------------  Eppie Gibson, MD

## 2015-10-09 NOTE — Progress Notes (Signed)
Mr. Kohring is here for his 30th fraction of radiation to his Right Tonsil/ Bilateral Neck. He denies pain except some throat which he will occasionally take pain medicine for several times a week. He is using his feeding tube to instill 2 cans of Osmolite four times a day. He is also instilling about 960 cc's of water daily. He is only drinking water orally. He reports a dry throat with thick saliva. He is having normal daily bowel movements. He is to see Dr. Marin Olp tomorrow for IV Fluids. His neck is red, hyperpigmented with some areas of peeling noted. He is using the sonafine cream twice daily to his neck area.   Temp 98.1 F (36.7 C)   Ht 6' (1.829 m)   Wt 218 lb (98.9 kg)   SpO2 98% Comment: room air  BMI 29.57 kg/m    Orthostatics: BP sitting 125/73, pulse 106. BP standing 98/60, pulse 108.  Wt Readings from Last 3 Encounters:  10/09/15 218 lb (98.9 kg)  10/06/15 218 lb 8 oz (99.1 kg)  10/02/15 212 lb (96.2 kg)

## 2015-10-10 ENCOUNTER — Ambulatory Visit
Admission: RE | Admit: 2015-10-10 | Discharge: 2015-10-10 | Disposition: A | Discharge status: Home / Self Care | Payer: Non-veteran care | Source: Ambulatory Visit | Attending: Radiation Oncology | Admitting: Radiation Oncology

## 2015-10-10 ENCOUNTER — Ambulatory Visit (HOSPITAL_BASED_OUTPATIENT_CLINIC_OR_DEPARTMENT_OTHER): Payer: Medicare Other

## 2015-10-10 DIAGNOSIS — Z510 Encounter for antineoplastic radiation therapy: Secondary | ICD-10-CM | POA: Diagnosis not present

## 2015-10-10 DIAGNOSIS — C099 Malignant neoplasm of tonsil, unspecified: Secondary | ICD-10-CM

## 2015-10-10 NOTE — Progress Notes (Signed)
Per Lexine Baton, Dr. Antonieta Pert nurse- pt is to receive NS today not 1/2 NS.  Lexine Baton also aware that the patient has no upcoming scheduled appts with Dr. Marin Olp and pt is requesting more appts for IVF. PT informed he will hear from [facility] later this week regarding those appts

## 2015-10-10 NOTE — Patient Instructions (Signed)

## 2015-10-11 ENCOUNTER — Encounter (HOSPITAL_COMMUNITY): Payer: Self-pay | Admitting: Radiology

## 2015-10-11 ENCOUNTER — Ambulatory Visit
Admission: RE | Admit: 2015-10-11 | Discharge: 2015-10-11 | Disposition: A | Discharge status: Home / Self Care | Payer: Non-veteran care | Source: Ambulatory Visit | Attending: Radiation Oncology | Admitting: Radiation Oncology

## 2015-10-11 ENCOUNTER — Ambulatory Visit (HOSPITAL_COMMUNITY)
Admission: RE | Admit: 2015-10-11 | Discharge: 2015-10-11 | Disposition: A | Discharge status: Home / Self Care | Payer: Medicare Other | Source: Ambulatory Visit | Attending: Interventional Radiology | Admitting: Interventional Radiology

## 2015-10-11 ENCOUNTER — Ambulatory Visit: Payer: Medicare Other | Admitting: Nutrition

## 2015-10-11 ENCOUNTER — Other Ambulatory Visit (HOSPITAL_COMMUNITY): Payer: Self-pay | Admitting: Interventional Radiology

## 2015-10-11 DIAGNOSIS — R633 Feeding difficulties, unspecified: Secondary | ICD-10-CM

## 2015-10-11 DIAGNOSIS — Z510 Encounter for antineoplastic radiation therapy: Secondary | ICD-10-CM | POA: Diagnosis not present

## 2015-10-11 HISTORY — PX: IR GENERIC HISTORICAL: IMG1180011

## 2015-10-11 NOTE — Procedures (Signed)
Patient came to IR to have gastrostomy site checked due to issues with leaking.  Rowe Robert PAC tightened the bumper on the g tube and replaced the bandage.  Patient sent home.

## 2015-10-11 NOTE — Progress Notes (Signed)
Nutrition follow-up completed with patient and wife after radiation therapy for tonsil cancer. Patient continues to tolerate 2 cans Osmolite 1.5 - 4 times a day with 120 cc free water before and after feeding. Patient denies tolerance issues. Patient is drinking some water by mouth. Reports tube is leaking and bandages have to be changed twice a day. Weight improved documented as 218 pounds on July 31 improved from 212 pounds July 24.  Nutrition diagnosis: Inadequate oral intake continues. Severe malnutrition, improved.  Estimated nutrition needs: 2400-2600 calories, 125-135 grams protein, 2.6 L fluid.  Intervention: Patient will continue Osmolite 1.5 via PEG 2 cans 4  times daily providing 2840 cal, 119.2 g protein, and approximately 2408 mL free water. Patient will continue to hold protein supplement. Will rrefer patient to M.D. for tube feeding leakage.  Monitoring, evaluation, goals:  Patient is tolerating tube feeding and has had improved weight.  Next visit: Monday, August 7.  **Disclaimer: This note was dictated with voice recognition software. Similar sounding words can inadvertently be transcribed and this note may contain transcription errors which may not have been corrected upon publication of note.**

## 2015-10-12 ENCOUNTER — Ambulatory Visit
Admission: RE | Admit: 2015-10-12 | Discharge: 2015-10-12 | Disposition: A | Discharge status: Home / Self Care | Payer: Non-veteran care | Source: Ambulatory Visit | Attending: Radiation Oncology | Admitting: Radiation Oncology

## 2015-10-12 DIAGNOSIS — Z510 Encounter for antineoplastic radiation therapy: Secondary | ICD-10-CM | POA: Diagnosis not present

## 2015-10-13 ENCOUNTER — Ambulatory Visit
Admission: RE | Admit: 2015-10-13 | Discharge: 2015-10-13 | Disposition: A | Discharge status: Home / Self Care | Payer: Non-veteran care | Source: Ambulatory Visit | Attending: Radiation Oncology | Admitting: Radiation Oncology

## 2015-10-13 DIAGNOSIS — Z510 Encounter for antineoplastic radiation therapy: Secondary | ICD-10-CM | POA: Diagnosis not present

## 2015-10-16 ENCOUNTER — Encounter: Payer: No Typology Code available for payment source | Admitting: Nutrition

## 2015-10-16 ENCOUNTER — Ambulatory Visit
Admission: RE | Admit: 2015-10-16 | Discharge: 2015-10-16 | Disposition: A | Discharge status: Home / Self Care | Payer: Medicare Other | Source: Ambulatory Visit | Attending: Radiation Oncology | Admitting: Radiation Oncology

## 2015-10-16 ENCOUNTER — Ambulatory Visit
Admission: RE | Admit: 2015-10-16 | Discharge: 2015-10-16 | Disposition: A | Discharge status: Home / Self Care | Payer: Non-veteran care | Source: Ambulatory Visit | Attending: Radiation Oncology | Admitting: Radiation Oncology

## 2015-10-16 ENCOUNTER — Encounter: Payer: Self-pay | Admitting: Radiation Oncology

## 2015-10-16 ENCOUNTER — Telehealth: Payer: Self-pay | Admitting: *Deleted

## 2015-10-16 ENCOUNTER — Encounter: Payer: Self-pay | Admitting: *Deleted

## 2015-10-16 VITALS — BP 116/68 | HR 88 | Temp 98.7°F | Ht 72.0 in | Wt 214.7 lb

## 2015-10-16 DIAGNOSIS — Z510 Encounter for antineoplastic radiation therapy: Secondary | ICD-10-CM | POA: Diagnosis not present

## 2015-10-16 DIAGNOSIS — C090 Malignant neoplasm of tonsillar fossa: Secondary | ICD-10-CM

## 2015-10-16 NOTE — Telephone Encounter (Signed)
CALLED PATIENT TO INFORM OF FU WITH DR. Marin Olp ON 10-18-15, LVM FOR A RETURN CALL

## 2015-10-16 NOTE — Progress Notes (Addendum)
Randy Hanson has completed XRT to his Rgiht Tonsil and bilateral neck.  Note mild redness and mucositis in the back of his throat and upper cheeks.  He is exclusively using his PEG tube - 8 cans of Osmolite daily.  Encouraged to drink fluids.  Note Dryness of skin in tx field and dry desquamation.   Reports level 4 pain when swallowing.  Peg site clean and flange pulled back to alleviate pressure.  BP 116/68 (BP Location: Right Arm, Patient Position: Sitting, Cuff Size: Large)   Pulse 88   Temp 98.7 F (37.1 C)   Ht 6' (1.829 m)   Wt 214 lb 11.2 oz (97.4 kg)   SpO2 98%   BMI 29.12 kg/m    Wt Readings from Last 3 Encounters:  10/16/15 214 lb 11.2 oz (97.4 kg)  10/09/15 218 lb (98.9 kg)  10/06/15 218 lb 8 oz (99.1 kg)

## 2015-10-16 NOTE — Progress Notes (Signed)
  Oncology Nurse Navigator Documentation  Met with Randy Hanson during final RT to offer support and to celebrate end of radiation treatment.  He was accompanied by his wife and daughter.  I provided wife with a Certificate of Recognition for her supportive care. I provided post-RT guidance:  Importance of keeping follow-up appts with Nutrition and SLP.  Importance of protecting treatment area from sun.  Continuation of Sonafine application 2-3 times daily.  Regular exercise to build stamina. I explained that my role as navigator will continue for several more months and that I will be calling and/or joining him during follow-up visits.   We discussed his attendance at the upcoming [facility], he and his wife voiced interest.  I noted a flyer will be mailed in the next week with information. I encouraged him to call me with needs/concerns.   Patient and wife verbalized understanding of information provided.  Gayleen Orem, RN, BSN, [place] at [facility] [phone]    Navigator Location: [facility] [phone]/07/17 1120) Navigator Encounter Type: Treatment (10/16/15 1120)             Treatment Phase: Final Radiation Tx (10/16/15 1120) Barriers/Navigation Needs: Education (10/16/15 1120) Education: Other (10/16/15 1120) Interventions: Education Method (10/16/15 1120)     Education Method: Verbal (10/16/15 1120)      Acuity: Level 2 (10/16/15 1120)         Time Spent with Patient: 60 (10/16/15 1120)

## 2015-10-16 NOTE — Progress Notes (Signed)
   Weekly Management Note:  Outpatient    ICD-9-CM ICD-10-CM   1. Carcinoma of tonsillar fossa (HCC) 146.1 C09.0     Current Dose: 70 Gy  Projected Dose: 70 Gy      Narrative:  The patient presents for routine under treatment assessment.  CBCT/MVCT images/Port film x-rays were reviewed.  The chart was checked.  He is exclusively using his PEG tube - 8 cans of Osmolite daily.  8 cups water daily.   Physical Findings:  Wt Readings from Last 3 Encounters:  10/16/15 214 lb 11.2 oz (97.4 kg)  10/09/15 218 lb (98.9 kg)  10/06/15 218 lb 8 oz (99.1 kg)    height is 6' (1.829 m) and weight is 214 lb 11.2 oz (97.4 kg). His temperature is 98.7 F (37.1 C). His blood pressure is 116/68 and his pulse is 88. His oxygen saturation is 98%.    Brisk gag. No signs of thrush. Skin over neck is erythematous/ hyperpigmented and dry with superficial dry peeling.  CBC    Component Value Date/Time   WBC 2.5 (L) 10/06/2015 0904   WBC 3.5 (L) 09/26/2015 1415   RBC 3.53 (L) 10/06/2015 0904   RBC 3.91 (L) 09/26/2015 1415   HGB 11.3 (L) 10/06/2015 0904   HCT 32.1 (L) 10/06/2015 0904   PLT 123 (L) 10/06/2015 0904   MCV 91 10/06/2015 0904   MCH 32.0 10/06/2015 0904   MCH 31.2 09/26/2015 1415   MCHC 35.2 10/06/2015 0904   MCHC 35.7 09/26/2015 1415   RDW 12.9 10/06/2015 0904   LYMPHSABS 0.2 (L) 10/06/2015 0904   MONOABS 0.4 08/24/2015 0859   EOSABS 0.0 10/06/2015 0904   BASOSABS 0.0 10/06/2015 0904     CMP     Component Value Date/Time   NA 134 10/06/2015 0904   NA 139 07/12/2015 1145   K 4.1 10/06/2015 0904   K 4.1 07/12/2015 1145   CL 98 10/06/2015 0904   CO2 32 10/06/2015 0904   CO2 28 07/12/2015 1145   GLUCOSE 138 (H) 10/06/2015 0904   BUN 23 (H) 10/06/2015 0904   BUN 19.8 07/12/2015 1145   CREATININE 0.9 10/06/2015 0904   CREATININE 1.1 07/12/2015 1145   CALCIUM 8.8 10/06/2015 0904   CALCIUM 10.0 07/12/2015 1145   PROT 5.8 (L) 10/06/2015 0904   PROT 7.2 07/12/2015 1145   ALBUMIN 2.9 (L) 10/06/2015 0904   ALBUMIN 4.2 08/18/2015 1503   ALBUMIN 3.9 07/12/2015 1145   AST 23 10/06/2015 0904   AST 20 07/12/2015 1145   ALT 31 10/06/2015 0904   ALT 16 07/12/2015 1145   ALKPHOS 63 10/06/2015 0904   ALKPHOS 61 07/12/2015 1145   BILITOT 0.50 10/06/2015 0904   BILITOT 0.47 07/12/2015 1145   GFRNONAA 78 08/18/2015 1503   GFRAA 90 08/18/2015 1503    Impression:  The patient is done with RT  Plan:  F/u in 37mo. I'll ask med/onc to followup with him in 1 week to monitor in case he needs more IVF while recovering. -----------------------------------  Eppie Gibson, MD

## 2015-10-18 ENCOUNTER — Ambulatory Visit (HOSPITAL_BASED_OUTPATIENT_CLINIC_OR_DEPARTMENT_OTHER): Payer: Medicare Other

## 2015-10-18 ENCOUNTER — Other Ambulatory Visit (HOSPITAL_BASED_OUTPATIENT_CLINIC_OR_DEPARTMENT_OTHER): Payer: Medicare Other

## 2015-10-18 ENCOUNTER — Encounter: Payer: Self-pay | Admitting: Hematology & Oncology

## 2015-10-18 ENCOUNTER — Ambulatory Visit (HOSPITAL_BASED_OUTPATIENT_CLINIC_OR_DEPARTMENT_OTHER): Payer: Medicare Other | Admitting: Hematology & Oncology

## 2015-10-18 VITALS — BP 112/76 | HR 103 | Temp 98.7°F | Resp 16 | Ht 72.0 in | Wt 216.8 lb

## 2015-10-18 DIAGNOSIS — E032 Hypothyroidism due to medicaments and other exogenous substances: Secondary | ICD-10-CM

## 2015-10-18 DIAGNOSIS — C099 Malignant neoplasm of tonsil, unspecified: Secondary | ICD-10-CM | POA: Diagnosis not present

## 2015-10-18 DIAGNOSIS — C090 Malignant neoplasm of tonsillar fossa: Secondary | ICD-10-CM | POA: Diagnosis not present

## 2015-10-18 LAB — CMP (CANCER CENTER ONLY)
ALBUMIN: 3.1 g/dL — AB (ref 3.3–5.5)
ALT(SGPT): 32 U/L (ref 10–47)
AST: 26 U/L (ref 11–38)
Alkaline Phosphatase: 80 U/L (ref 26–84)
BILIRUBIN TOTAL: 0.5 mg/dL (ref 0.20–1.60)
BUN, Bld: 24 mg/dL — ABNORMAL HIGH (ref 7–22)
CALCIUM: 9.3 mg/dL (ref 8.0–10.3)
CHLORIDE: 98 meq/L (ref 98–108)
CO2: 29 mEq/L (ref 18–33)
CREATININE: 0.8 mg/dL (ref 0.6–1.2)
Glucose, Bld: 162 mg/dL — ABNORMAL HIGH (ref 73–118)
POTASSIUM: 4.3 meq/L (ref 3.3–4.7)
Sodium: 132 mEq/L (ref 128–145)
TOTAL PROTEIN: 7 g/dL (ref 6.4–8.1)

## 2015-10-18 LAB — CBC WITH DIFFERENTIAL (CANCER CENTER ONLY)
BASO#: 0 10*3/uL (ref 0.0–0.2)
BASO%: 1.4 % (ref 0.0–2.0)
EOS ABS: 0 10*3/uL (ref 0.0–0.5)
EOS%: 0.3 % (ref 0.0–7.0)
HEMATOCRIT: 32.6 % — AB (ref 38.7–49.9)
HEMOGLOBIN: 11.4 g/dL — AB (ref 13.0–17.1)
LYMPH#: 0.2 10*3/uL — ABNORMAL LOW (ref 0.9–3.3)
LYMPH%: 5.1 % — ABNORMAL LOW (ref 14.0–48.0)
MCH: 32.5 pg (ref 28.0–33.4)
MCHC: 35 g/dL (ref 32.0–35.9)
MCV: 93 fL (ref 82–98)
MONO#: 0.5 10*3/uL (ref 0.1–0.9)
MONO%: 16.9 % — ABNORMAL HIGH (ref 0.0–13.0)
NEUT%: 76.3 % (ref 40.0–80.0)
NEUTROS ABS: 2.3 10*3/uL (ref 1.5–6.5)
Platelets: 146 10*3/uL (ref 145–400)
RBC: 3.51 10*6/uL — AB (ref 4.20–5.70)
RDW: 15.4 % (ref 11.1–15.7)
WBC: 3 10*3/uL — ABNORMAL LOW (ref 4.0–10.0)

## 2015-10-18 MED ORDER — HEPARIN SOD (PORK) LOCK FLUSH 100 UNIT/ML IV SOLN
500.0000 [IU] | Freq: Once | INTRAVENOUS | Status: AC | PRN
  Administered 2015-10-18: 500 [IU]
  Filled 2015-10-18: qty 5

## 2015-10-18 MED ORDER — HEPARIN SOD (PORK) LOCK FLUSH 100 UNIT/ML IV SOLN
250.0000 [IU] | Freq: Once | INTRAVENOUS | Status: DC | PRN
  Filled 2015-10-18: qty 5

## 2015-10-18 MED ORDER — ALTEPLASE 2 MG IJ SOLR
2.0000 mg | Freq: Once | INTRAMUSCULAR | Status: DC | PRN
  Filled 2015-10-18: qty 2

## 2015-10-18 MED ORDER — SODIUM CHLORIDE 0.9 % IJ SOLN
10.0000 mL | INTRAMUSCULAR | Status: DC | PRN
  Administered 2015-10-18: 10 mL
  Filled 2015-10-18: qty 10

## 2015-10-18 MED ORDER — SODIUM CHLORIDE 0.9 % IV SOLN
Freq: Once | INTRAVENOUS | Status: AC
  Administered 2015-10-18: 11:00:00 via INTRAVENOUS

## 2015-10-18 NOTE — Patient Instructions (Signed)

## 2015-10-18 NOTE — Progress Notes (Signed)
Hematology and Oncology Follow Up Visit  Randy Hanson [id number] 07/04/1940 75 y.o. 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 He completed his radiation therapy this past Monday.  He did had to have a feeding tube placed. This is causing him some discomfort. I'm sure that we will be able to get this out at some point in the near future.  He still is really not able to swallow much. He has a lot of radiation mucositis in the back of his throat.  The adenopathy in the right neck has resolved.  He has had no problem with diarrhea. In fact, he may little bit of constipation from the pain medications.  There has been no issues with nausea or vomiting. He takes about 8 cans a day of Osmolite.  He's had no cough. He's had no bleeding. He's had no rashes. He's had no leg swelling.  He does have radiation changes on his neck.  Overall, his performance status is ECOG 1.     Medications:    Medication List       Accurate as of 10/18/15 11:25 AM. Always use your most recent med list.          aspirin 81 MG tablet Take 81 mg by mouth daily.   b complex vitamins tablet Take 1 tablet by mouth daily. Reported on 09/19/2015   docusate sodium 100 MG capsule Commonly known as:  COLACE Take 100 mg by mouth 2 (two) times daily.   feeding supplement (OSMOLITE 1.5 CAL) Liqd Begin Osmolite 1.5 or equivalent via PEG, 1 can QID with 120 cc free water before and after bolus. Increase by one can every 2 days until goal of 7 cans achieved. Give 30 mL promod or equivalent BID mixed with 6 oz water.  Flush with 60 cc free water after Promod.   Fish Oil 1000 MG Caps Take 1 capsule by mouth daily. Reported on 09/25/2015   hydrochlorothiazide  25 MG tablet Commonly known as:  HYDRODIURIL Take 25 mg by mouth daily.   HYDROcodone-acetaminophen 5-325 MG tablet Commonly known as:  NORCO/VICODIN   lidocaine 2 % solution Commonly known as:  XYLOCAINE Patient: Mix 1part 2% viscous lidocaine, 1part H20. Swish and/or swallow 10mL of this mixture, before meals and at bedtime, up to QID   lidocaine-prilocaine cream Commonly known as:  EMLA Apply to affected area as needed   LORazepam 0.5 MG tablet Commonly known as:  ATIVAN Take 1 tablet (0.5 mg total) by mouth every 6 (six) hours as needed (Nausea or vomiting).   losartan 50 MG tablet Commonly known as:  COZAAR Take 50 mg by mouth daily.   mucosal barrier oral Gel Take 1 packet by mouth 3 (three) times daily. prn   ondansetron 8 MG tablet Commonly known as:  ZOFRAN Take 1 tablet (8 mg total) by mouth 2 (two) times daily as needed. Start on the third day after chemotherapy.   oxyCODONE 5 MG/5ML solution Commonly known as:  ROXICODONE Take 7.5 mLs (7.5 mg total) by mouth every 6 (six) hours as needed for severe pain.   Pantothenic Acid 500 MG Tabs Take 1 tablet by mouth daily. Reported on 09/25/2015  polyethylene glycol packet Commonly known as:  MIRALAX / GLYCOLAX Take 17 g by mouth daily.   prochlorperazine 10 MG tablet Commonly known as:  COMPAZINE Take 1 tablet (10 mg total) by mouth every 6 (six) hours as needed (Nausea or vomiting).   ranitidine 150 MG capsule Commonly known as:  ZANTAC Take 150 mg by mouth 2 (two) times daily.   SONAFINE Apply 1 application topically 3 (three) times daily.   sucralfate 1 g tablet Commonly known as:  CARAFATE Dissolve 1 tablet in 10 mL H20 and swallow up to QID prn sore throat.   terazosin 2 MG capsule Commonly known as:  HYTRIN Take 2 mg by mouth at bedtime.       Allergies:  Allergies  Allergen Reactions  . Lisinopril Cough    Past Medical History, Surgical history, Social history, and Family History  were reviewed and updated.  Review of Systems: All other 10 point review of systems is negative.   Physical Exam:  height is 6' (1.829 m) and weight is 216 lb 12.8 oz (98.3 kg). His oral temperature is 98.7 F (37.1 C). His blood pressure is 112/76 and his pulse is 103 (abnormal). His respiration is 16.   Wt Readings from Last 3 Encounters:  10/18/15 216 lb 12.8 oz (98.3 kg)  10/16/15 214 lb 11.2 oz (97.4 kg)  10/09/15 218 lb (98.9 kg)    Head and neck exam shows no ocular lesions. He does have erythema and mucositis in the soft palate. His oral mucosa is somewhat dry. He has erythema on his neck. There is no radiation dermatitis breakdown on his neck.No obvious adenopathy is noted on the neck. Thyroid is nonpalpable. Lungs are clear. Cardiac exam regular rate and rhythm with no murmurs, rubs or bruits. Abdomen is soft. His feeding tube is in the upper left quadrant. This is intact. He has decent bowel sounds. He has some slight tenderness at the G-tube site. There is no palpable liver or spleen tip. Back exam shows no tenderness over the spine, ribs or hips. Extremities shows no clubbing, cyanosis or edema. Skin exam is slightly dry. Neurological exam shows no focal neurological deficits.   Lab Results  Component Value Date   WBC 3.0 (L) 10/18/2015   HGB 11.4 (L) 10/18/2015   HCT 32.6 (L) 10/18/2015   MCV 93 10/18/2015   PLT 146 10/18/2015   No results found for: FERRITIN, IRON, TIBC, UIBC, IRONPCTSAT Lab Results  Component Value Date   RBC 3.51 (L) 10/18/2015   No results found for: KPAFRELGTCHN, LAMBDASER, KAPLAMBRATIO No results found for: IGGSERUM, IGA, IGMSERUM No results found for: Marda Stalker, SPEI   Chemistry      Component Value Date/Time   NA 132 10/18/2015 1023   NA 139 07/12/2015 1145   K 4.3 10/18/2015 1023   K 4.1 07/12/2015 1145   CL 98 10/18/2015 1023   CO2 29 10/18/2015 1023   CO2 28 07/12/2015 1145    BUN 24 (H) 10/18/2015 1023   BUN 19.8 07/12/2015 1145   CREATININE 0.8 10/18/2015 1023   CREATININE 1.1 07/12/2015 1145      Component Value Date/Time   CALCIUM 9.3 10/18/2015 1023   CALCIUM 10.0 07/12/2015 1145   ALKPHOS 80 10/18/2015 1023   ALKPHOS 61 07/12/2015 1145   AST 26 10/18/2015 1023   AST 20 07/12/2015 1145   ALT 32 10/18/2015 1023   ALT 16 07/12/2015 1145   BILITOT 0.50 10/18/2015  1023   BILITOT 0.47 07/12/2015 1145     Impression and Plan: Mr. Sagel is a very pleasant 75 yo white male with stage I-HPV+ - squamous cell carcinoma the right tonsil. He has done well so far with chemotherapy and radiation.   I'm glad that he has completed his treatments. I know that his body will heal up gradually.  I would not get another PET scan on him probably for least 2-3 months.  The adenopathy has resolved. I'm sure that when we rescan him, that he will be in aremission.  I will go ahead and give him some IV fluids today. I did this will be important for him.  I would like to see him back in about 2-3 weeks. He still may need to have some fluids.  It seems like his pain control is doing pretty well right now.   [name], MD 8/9/201711:25 AM

## 2015-10-20 ENCOUNTER — Ambulatory Visit: Payer: No Typology Code available for payment source

## 2015-10-20 ENCOUNTER — Ambulatory Visit: Payer: No Typology Code available for payment source | Admitting: Hematology & Oncology

## 2015-10-20 ENCOUNTER — Other Ambulatory Visit: Payer: No Typology Code available for payment source

## 2015-10-23 ENCOUNTER — Ambulatory Visit: Payer: Non-veteran care

## 2015-10-23 ENCOUNTER — Ambulatory Visit: Payer: Medicare Other | Attending: Radiation Oncology

## 2015-10-23 DIAGNOSIS — R1310 Dysphagia, unspecified: Secondary | ICD-10-CM | POA: Diagnosis not present

## 2015-10-23 NOTE — Therapy (Signed)
[facility] [address] [place], [place], [address] Phone: [phone]   Fax:  [phone]  Speech Language Pathology Treatment  Patient Details  Name: Randy Hanson MRN: [id number] Date of Birth: 29-Jul-1940 Referring Provider: Lonie Peak MD  Encounter Date: 10/23/2015      End of Session - 10/23/15 1704    Visit Number 2   Number of Visits 3   Date for SLP Re-Evaluation 11/10/15   [facility]ization Type [facility] - wife stated she got a paper saying ST would be covered   SLP Start Time 1450   SLP Stop Time  1530   SLP Time Calculation (min) 40 min   Activity Tolerance Patient tolerated treatment well      Past Medical History:  Diagnosis Date  . Back pain   . BPH (benign prostatic hyperplasia)   . 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 DJD (degenerative joint disease)   . Esophageal reflux   . FHx: colonic polyps   . Hemorrhoids   . Hypercholesteremia   . Hyperlipemia   . Hypertension   . Kidney cysts    [facility] [place] [facility]2012  . Posterior vitreous detachment, left eye    DR. KATHERINE HECKER  AUGUST 2011    Past Surgical History:  Procedure Laterality Date  . CARDIAC CATHETERIZATION     remote >15 years ago  . IR GENERIC HISTORICAL  10/11/2015   IR PATIENT EVAL TECH 0-60 MINS 10/11/2015 Darrell K Allred, PA-C [facility]-INTERV RAD  . VASECTOMY  1975    There were no vitals filed for this visit.      Subjective Assessment - 10/23/15 1457    Subjective Pt arrives today stating liquids "get over the hump and then stop" he thinks due to mucous.               ADULT SLP TREATMENT - 10/23/15 1458      General Information   Behavior/Cognition Alert;Cooperative;Pleasant mood     Treatment Provided   Treatment provided Dysphagia     Dysphagia Treatment   Temperature Spikes Noted No   Respiratory Status Room air   Treatment Methods Skilled observation;Compensation strategy  training;Patient/caregiver education;Therapeutic exercise   Patient observed directly with PO's Yes   Type of PO's observed Thin liquids   Pharyngeal Phase Signs & Symptoms Immediate cough  with larger sip   Other treatment/comments Pt took sips x4 of water - first three without overt s/s aspiration, but fourth was larger and pt coughed. SLP told pt to ensure smaller sips with water each day for further swallowing exercise (as pt primarily uses PEG for hydration/nutrition), and to slow intake or Miralax instead of "turning it up". Pt required mod-max A with all HEP exercises, and SLP A to tell why he is doing exercises. SLP provided signs/symptoms aspiration PNA     Pain Assessment   Pain Assessment No/denies pain     Assessment / Recommendations / Plan   Plan Continue with current plan of care     Dysphagia Recommendations   Diet recommendations Thin liquid  diet as tolerated   Compensations Effortful swallow     Progression Toward Goals   Progression toward goals Progressing toward goals          SLP Education - 10/23/15 1705    Education provided Yes   Education Details HEP, edema folloiwng rad tx, s/s aspiration PNA, reason why pt is doing Child psychotherapist) Educated  Patient;Spouse   Methods Explanation;Demonstration;Tactile cues;Handout   Comprehension Verbalized understanding;Returned demonstration;Verbal cues required;Need further instruction          SLP Short Term Goals - 10/23/15 1706      SLP SHORT TERM GOAL #1   Title pt will demo HEP with rare min A   Time 1   Period --  visit   Status Not Met  continue to next visit     SLP SHORT TERM GOAL #2   Title pt will tell SLP why he is completing HEP   Time 1   Period --  visit   Status Not Met  cont to next visit          SLP Long Term Goals - 10/23/15 1707      SLP LONG TERM GOAL #1   Title pt will complete HEP with modified independence   Time 1   Period --  visits   Status On-going     SLP LONG  TERM GOAL #2   Title pt will tell SLP how a food journal can assist pt in returining to more normalized food intake   Time 1   Period --  visits   Status On-going          Plan - 10/23/15 1704    Clinical Impression Statement Pt presents with functional swallowing for thin liquids with small sips.a week following his final rad tx session. Pt would benefit from cont'd ST to address both correct completion of HEP and assessment of safety of POs.   Speech Therapy Frequency --  approx every four weeks   Duration --  3 sessions in 60-90 days (Sept 1st)   Treatment/Interventions Aspiration precaution training;Pharyngeal strengthening exercises;Diet toleration management by SLP;Trials of upgraded texture/liquids;Compensatory techniques;Patient/family education;SLP instruction and feedback  any or all may be used in ST   Potential to Achieve Goals Good   SLP Home Exercise Plan provided today   Consulted and Agree with Plan of Care Patient      Patient will benefit from skilled therapeutic intervention in order to improve the following deficits and impairments:   Dysphagia    Problem List Patient Active Problem List   Diagnosis Date Noted  . Carcinoma of tonsillar fossa (HCC) 07/19/2015  . 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 Abnormal EKG 04/22/2014  . Essential hypertension 04/22/2014  . Hyperlipidemia 04/22/2014    [facility] ,MS, CCC-SLP  10/23/2015, 5:08 PM  [place] [facility] [address] [place], [place], [address] Phone: [phone]   Fax:  [phone]   Name: Randy Hanson MRN: [id number] Date of Birth: 07/05/1940

## 2015-10-25 NOTE — Progress Notes (Signed)
Radiation Oncology         ([phone]) [phone] ________________________________  Name: Randy Hanson MRN: [id number]  Date: 10/16/2015  DOB: 28-Aug-1940  End of Treatment Note  Diagnosis:      ICD-9-CM ICD-10-CM   1. Carcinoma of tonsillar fossa (HCC) 146.1 C09.0        Indication for treatment:  Curative w/ concurrent chemotherapy  Radiation treatment dates:   08/28/2015-10/16/2015  Site/dose:    1. The Right tonsil and bilateral neck were treated PTV High to 70 Gy in 35 fractions at 2 Gy per fraction. 2. The Right tonsil and bilateral neck were treated PTV Med to 63 Gy in 35 fractions at 1.8 Gy per fraction. 3. The Right tonsil and bilateral neck were treated PTV Low to 56 Gy in 35 fractions at 1.6 Gy per fraction.  Beams/energy:    1. IMRT // 6X 2. IMRT // 6X 3. IMRT // 6X  Narrative: The patient tolerated radiation treatment relatively well. He continued to report pain with swallowing, 4/10. Poor appetite. The patient developed erythematous / hyperpigmented skin over the neck and superficial dry peeling. He elected for a PEG tube midway through treatment.  Plan: The patient has completed radiation treatment. The patient will return to radiation oncology clinic for routine followup in one  month. I advised them to call or return sooner if they have any questions or concerns related to their recovery or treatment.  -----------------------------------  Lonie Peak, MD   This document serves as a record of services personally performed by Lonie Peak, MD. It was created on her behalf by Delana Meyer, a trained medical scribe. The creation of this record is based on the scribe's personal observations and the provider's statements to them. This document has been checked and approved by the attending provider.

## 2015-11-09 ENCOUNTER — Ambulatory Visit (HOSPITAL_BASED_OUTPATIENT_CLINIC_OR_DEPARTMENT_OTHER): Payer: Medicare Other | Admitting: Hematology & Oncology

## 2015-11-09 ENCOUNTER — Other Ambulatory Visit (HOSPITAL_BASED_OUTPATIENT_CLINIC_OR_DEPARTMENT_OTHER): Payer: Medicare Other

## 2015-11-09 ENCOUNTER — Ambulatory Visit (HOSPITAL_BASED_OUTPATIENT_CLINIC_OR_DEPARTMENT_OTHER): Payer: Medicare Other

## 2015-11-09 VITALS — Wt 223.0 lb

## 2015-11-09 VITALS — BP 119/75 | HR 101 | Temp 98.5°F | Resp 18

## 2015-11-09 DIAGNOSIS — C099 Malignant neoplasm of tonsil, unspecified: Secondary | ICD-10-CM

## 2015-11-09 DIAGNOSIS — E032 Hypothyroidism due to medicaments and other exogenous substances: Secondary | ICD-10-CM

## 2015-11-09 DIAGNOSIS — C090 Malignant neoplasm of tonsillar fossa: Secondary | ICD-10-CM

## 2015-11-09 LAB — COMPREHENSIVE METABOLIC PANEL
ALT: 14 U/L (ref 0–55)
ANION GAP: 11 meq/L (ref 3–11)
AST: 18 U/L (ref 5–34)
Albumin: 3.3 g/dL — ABNORMAL LOW (ref 3.5–5.0)
Alkaline Phosphatase: 76 U/L (ref 40–150)
BUN: 22.8 mg/dL (ref 7.0–26.0)
CO2: 26 meq/L (ref 22–29)
CREATININE: 0.8 mg/dL (ref 0.7–1.3)
Calcium: 9.5 mg/dL (ref 8.4–10.4)
Chloride: 101 mEq/L (ref 98–109)
EGFR: 88 mL/min/{1.73_m2} — ABNORMAL LOW (ref >90)
GLUCOSE: 137 mg/dL (ref 70–140)
Potassium: 4.2 mEq/L (ref 3.5–5.1)
SODIUM: 137 meq/L (ref 136–145)
TOTAL PROTEIN: 6.5 g/dL (ref 6.4–8.3)

## 2015-11-09 LAB — CBC WITH DIFFERENTIAL (CANCER CENTER ONLY)
BASO#: 0 10*3/uL (ref 0.0–0.2)
BASO%: 0.6 % (ref 0.0–2.0)
EOS ABS: 0 10*3/uL (ref 0.0–0.5)
EOS%: 0.6 % (ref 0.0–7.0)
HCT: 32.1 % — ABNORMAL LOW (ref 38.7–49.9)
HGB: 11 g/dL — ABNORMAL LOW (ref 13.0–17.1)
LYMPH#: 0.5 10*3/uL — ABNORMAL LOW (ref 0.9–3.3)
LYMPH%: 10.3 % — AB (ref 14.0–48.0)
MCH: 33.7 pg — AB (ref 28.0–33.4)
MCHC: 34.3 g/dL (ref 32.0–35.9)
MCV: 99 fL — AB (ref 82–98)
MONO#: 0.8 10*3/uL (ref 0.1–0.9)
MONO%: 17.5 % — AB (ref 0.0–13.0)
NEUT#: 3.4 10*3/uL (ref 1.5–6.5)
NEUT%: 71 % (ref 40.0–80.0)
PLATELETS: 255 10*3/uL (ref 145–400)
RBC: 3.26 10*6/uL — AB (ref 4.20–5.70)
RDW: 19.5 % — ABNORMAL HIGH (ref 11.1–15.7)
WBC: 4.8 10*3/uL (ref 4.0–10.0)

## 2015-11-09 MED ORDER — SODIUM CHLORIDE 0.9% FLUSH
10.0000 mL | INTRAVENOUS | Status: DC | PRN
  Administered 2015-11-09: 10 mL via INTRAVENOUS
  Filled 2015-11-09: qty 10

## 2015-11-09 MED ORDER — HEPARIN SOD (PORK) LOCK FLUSH 100 UNIT/ML IV SOLN
500.0000 [IU] | Freq: Once | INTRAVENOUS | Status: AC
  Administered 2015-11-09: 500 [IU] via INTRAVENOUS
  Filled 2015-11-09: qty 5

## 2015-11-09 NOTE — Progress Notes (Signed)
Hematology and Oncology Follow Up Visit  Randy Hanson [id number] 1940-08-10 75 y.o. 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 He still has a feeding tube in. He still not able to swallow anything.  He has a lot of secretions from his mouth. He is rinsing his mouth out with water and baking soda.  The feeding to is no longer hurting him at the insertion site. This is encouraging.  He's had no issues with fever, sweats or chills. He's had no bleeding or bruising. He is going to the bathroom but not as much because he is not eating as much.  He's had no leg swelling. There may be some slight swelling about his ankles.  He's had no nausea or vomiting.  He's had no swelling in the neck.  The actual skin on his neck looks much better. Looks leg is healing up nicely.  Overall, his performance status is ECOG 1.     Medications:    Medication List       Accurate as of 11/09/15  3:33 PM. Always use your most recent med list.          aspirin 81 MG tablet Take 81 mg by mouth daily.   b complex vitamins tablet Take 1 tablet by mouth daily. Reported on 09/19/2015   docusate sodium 100 MG capsule Commonly known as:  COLACE Take 100 mg by mouth 2 (two) times daily.   feeding supplement (OSMOLITE 1.5 CAL) Liqd Begin Osmolite 1.5 or equivalent via PEG, 1 can QID with 120 cc free water before and after bolus. Increase by one can every 2 days until goal of 7 cans achieved. Give 30 mL promod or equivalent BID mixed with 6 oz water.  Flush with 60 cc free water after Promod.   Fish Oil 1000 MG Caps Take 1 capsule by mouth daily. Reported on 09/25/2015   hydrochlorothiazide 25 MG tablet Commonly known as:  HYDRODIURIL Take 25 mg by mouth daily.     HYDROcodone-acetaminophen 5-325 MG tablet Commonly known as:  NORCO/VICODIN   lidocaine 2 % solution Commonly known as:  XYLOCAINE Patient: Mix 1part 2% viscous lidocaine, 1part H20. Swish and/or swallow 10mL of this mixture, before meals and at bedtime, up to QID   lidocaine-prilocaine cream Commonly known as:  EMLA Apply to affected area as needed   LORazepam 0.5 MG tablet Commonly known as:  ATIVAN Take 1 tablet (0.5 mg total) by mouth every 6 (six) hours as needed (Nausea or vomiting).   losartan 50 MG tablet Commonly known as:  COZAAR Take 50 mg by mouth daily.   mucosal barrier oral Gel Take 1 packet by mouth 3 (three) times daily. prn   ondansetron 8 MG tablet Commonly known as:  ZOFRAN Take 1 tablet (8 mg total) by mouth 2 (two) times daily as needed. Start on the third day after chemotherapy.   oxyCODONE 5 MG/5ML solution Commonly known as:  ROXICODONE Take 7.5 mLs (7.5 mg total) by mouth every 6 (six) hours as needed for severe pain.   Pantothenic Acid 500 MG Tabs Take 1 tablet by mouth daily. Reported on 09/25/2015   polyethylene glycol packet Commonly known as:  MIRALAX / GLYCOLAX Take 17  g by mouth daily.   prochlorperazine 10 MG tablet Commonly known as:  COMPAZINE Take 1 tablet (10 mg total) by mouth every 6 (six) hours as needed (Nausea or vomiting).   ranitidine 150 MG capsule Commonly known as:  ZANTAC Take 150 mg by mouth 2 (two) times daily.   SONAFINE Apply 1 application topically 3 (three) times daily.   sucralfate 1 g tablet Commonly known as:  CARAFATE Dissolve 1 tablet in 10 mL H20 and swallow up to QID prn sore throat.   terazosin 2 MG capsule Commonly known as:  HYTRIN Take 2 mg by mouth at bedtime.       Allergies:  Allergies  Allergen Reactions  . Lisinopril Cough    Past Medical History, Surgical history, Social history, and Family History were reviewed and updated.  Review of Systems: All other 10 point review  of systems is negative.   Physical Exam:  oral temperature is 98.5 F (36.9 C). His blood pressure is 119/75 and his pulse is 101 (abnormal). His respiration is 18.   Wt Readings from Last 3 Encounters:  11/09/15 223 lb (101.2 kg)  10/18/15 216 lb 12.8 oz (98.3 kg)  10/16/15 214 lb 11.2 oz (97.4 kg)    Head and neck exam shows no ocular lesions. He does have minimal erythema and mucositis in the soft palate. His oral mucosa is somewhat dry. He has erythema on his neck. There is no radiation dermatitis breakdown on his neck.No obvious adenopathy is noted on the neck. Thyroid is nonpalpable. Lungs are clear. Cardiac exam regular rate and rhythm with no murmurs, rubs or bruits. Abdomen is soft. His feeding tube is in the upper left quadrant. This is intact. He has decent bowel sounds. He has some slight tenderness at the G-tube site. There is no palpable liver or spleen tip. Back exam shows no tenderness over the spine, ribs or hips. Extremities shows no clubbing, cyanosis or edema. Skin exam is slightly dry. Neurological exam shows no focal neurological deficits.   Lab Results  Component Value Date   WBC 4.8 11/09/2015   HGB 11.0 (L) 11/09/2015   HCT 32.1 (L) 11/09/2015   MCV 99 (H) 11/09/2015   PLT 255 11/09/2015   No results found for: FERRITIN, IRON, TIBC, UIBC, IRONPCTSAT Lab Results  Component Value Date   RBC 3.26 (L) 11/09/2015   No results found for: KPAFRELGTCHN, LAMBDASER, KAPLAMBRATIO No results found for: IGGSERUM, IGA, IGMSERUM No results found for: Marda Stalker, SPEI   Chemistry      Component Value Date/Time   NA 132 10/18/2015 1023   NA 139 07/12/2015 1145   K 4.3 10/18/2015 1023   K 4.1 07/12/2015 1145   CL 98 10/18/2015 1023   CO2 29 10/18/2015 1023   CO2 28 07/12/2015 1145   BUN 24 (H) 10/18/2015 1023   BUN 19.8 07/12/2015 1145   CREATININE 0.8 10/18/2015 1023   CREATININE 1.1 07/12/2015 1145       Component Value Date/Time   CALCIUM 9.3 10/18/2015 1023   CALCIUM 10.0 07/12/2015 1145   ALKPHOS 80 10/18/2015 1023   ALKPHOS 61 07/12/2015 1145   AST 26 10/18/2015 1023   AST 20 07/12/2015 1145   ALT 32 10/18/2015 1023   ALT 16 07/12/2015 1145   BILITOT 0.50 10/18/2015 1023   BILITOT 0.47 07/12/2015 1145     Impression and Plan: Randy Hanson is a very pleasant 75 yo white male with stage  I-HPV+ - squamous cell carcinoma the right tonsil. He has done well so far with chemotherapy and radiation.   I'm glad that he has completed his treatments. I know that his body will heal up gradually.  I would not get another PET scan on him probably for least 2 months.  The adenopathy has resolved. I'm sure that when we rescan him, that he will be in a remission.  I think that I can probably see him back in 1 month now. Hopefully, by then, he will be able to swallow better. Hopefully he will be able to have at least some full liquids and some soft food.    [name], MD 8/31/20173:33 PM

## 2015-11-09 NOTE — Patient Instructions (Signed)

## 2015-11-09 NOTE — Progress Notes (Signed)
Randy Hanson presented for Portacath access and flush. Proper placement of portacath confirmed by CXR. Portacath located in the left chest wall accessed with  H 20 needle. Clean, Dry and Intact Good blood return present. Portacath flushed with 32ml NS and 500U/20ml Heparin per protocol and needle removed intact. Procedure without incident. Patient tolerated procedure well.

## 2015-11-10 LAB — TSH: TSH: 0.324 m[IU]/L (ref 0.320–4.118)

## 2015-11-14 ENCOUNTER — Encounter: Payer: Self-pay | Admitting: Radiation Oncology

## 2015-11-14 NOTE — Progress Notes (Signed)
Mr. Laos presents for follow up of radiation completed 10/16/15 to his Right Tonsil and bilateral neck.   Pain issues, if any: He denies pain.  Using a feeding tube?: Yes, He is instilling Osmolite 2 cans four times a day. He is also instilling 960 ml of water daily through his PEG tube.  Weight changes, if any:  Wt Readings from Last 3 Encounters:  11/17/15 223 lb (101.2 kg)  11/09/15 223 lb (101.2 kg)  10/18/15 216 lb 12.8 oz (98.3 kg)   Swallowing issues, if any: He is not swallowing anything but water. He tells me his thick saliva prevents him from eating. He has tried Popsicles, but tells me they burned his throat, and he coughed up orange salvia for hours.  Smoking or chewing tobacco? No Using fluoride trays daily? He is not using them. He tells me they are uncomfortable, and leave a thick coating on his teeth. He is brushing his teeth.  Last ENT visit was on: No Other notable issues, if any:  He reports taste changes which have prevented him from swallowing. He also has a continued dry throat.  His skin has healed and is hyperpigmented. He does have some dry peeling noted to his Left Clavicle area. He is using sonafine cream daily. He knows to use vitamin E cream when he finishes the sonafine.  BP (!) 129/94   Pulse (!) 108   Temp 98.7 F (37.1 C)   Ht 6' (1.829 m)   Wt 223 lb (101.2 kg)   SpO2 98% Comment: room air  BMI 30.24 kg/m

## 2015-11-17 ENCOUNTER — Ambulatory Visit
Admission: RE | Admit: 2015-11-17 | Discharge: 2015-11-17 | Disposition: A | Discharge status: Home / Self Care | Payer: Medicare Other | Source: Ambulatory Visit | Attending: Radiation Oncology | Admitting: Radiation Oncology

## 2015-11-17 ENCOUNTER — Encounter: Payer: Self-pay | Admitting: Radiation Oncology

## 2015-11-17 DIAGNOSIS — R633 Feeding difficulties: Secondary | ICD-10-CM | POA: Diagnosis not present

## 2015-11-17 DIAGNOSIS — A630 Anogenital (venereal) warts: Secondary | ICD-10-CM | POA: Diagnosis not present

## 2015-11-17 DIAGNOSIS — C090 Malignant neoplasm of tonsillar fossa: Secondary | ICD-10-CM | POA: Diagnosis not present

## 2015-11-17 HISTORY — DX: Reserved for concepts with insufficient information to code with codable children: IMO0002

## 2015-11-17 HISTORY — DX: Reserved for inherently not codable concepts without codable children: IMO0001

## 2015-11-17 NOTE — Progress Notes (Signed)
Radiation Oncology         [phone] ________________________________  Name: Randy Hanson MRN: [id number]  Date: 11/17/2015  DOB: 1941/01/30  Follow-Up Visit Note  CC: [name], MD  [name], MD  Diagnosis and Prior Radiotherapy:       ICD-9-CM ICD-10-CM   1. Carcinoma of tonsillar fossa (HCC) 146.1 C09.0 NM PET Image Restag (PS) Skull Base To Thigh     Ambulatory referral to Physical Therapy     Amb Referral to Nutrition and Diabetic E     Referral to Neuro Rehab     Ambulatory referral to Social Work     Stage IVA [phone]) - HPV + - Squamous cell carcinoma of the right tonsil  08/28/2015-10/16/2015: 1. The Right tonsil and bilateral neck were treated PTV High to 70 Gy in 35 fractions at 2 Gy per fraction. 2. The Right tonsil and bilateral neck were treated PTV Med to 63 Gy in 35 fractions at 1.8 Gy per fraction. 3. The Right tonsil and bilateral neck were treated PTV Low to 56 Gy in 35 fractions at 1.6 Gy per fraction.  Narrative:  The patient returns today for routine follow-up. He saw Dr. Myna Hidalgo on 10/18/15.  Pain issues, if any: He denies pain, but takes Norco at night to help him go to sleep. Using a feeding tube?: Yes, He is instilling Osmolite 2 cans four times a day. He is also instilling 960 ml of water daily through his PEG tube.  Weight changes, if any:  Wt Readings from Last 3 Encounters:  11/17/15 223 lb (101.2 kg)  11/09/15 223 lb (101.2 kg)  10/18/15 216 lb 12.8 oz (98.3 kg)   Swallowing issues, if any: He is not swallowing anything but water. His thick saliva prevents him from eating. He has tried Popsicles, but reports they burned his throat and he coughed up orange salvia for hours. He is using baking soda and water. Smoking or chewing tobacco? No Using fluoride trays daily? He is not using them. He says they are uncomfortable and leave a thick coating on his teeth. He is brushing his teeth.  Last ENT visit was on: No Other notable issues,  if any: He reports taste changes which have prevented him from swallowing. He also has a continued dry throat. His skin has healed and is hyperpigmented. He does have some dry peeling noted to his Left Clavicle area. He is using sonafine cream daily. He knows to use vitamin E cream when he finishes the sonafine.  ALLERGIES:  is allergic to lisinopril.  Meds: Current Outpatient Prescriptions  Medication Sig Dispense Refill  . docusate sodium (COLACE) 100 MG capsule Take 100 mg by mouth 2 (two) times daily.    Marland Kitchen HYDROcodone-acetaminophen (NORCO/VICODIN) 5-325 MG tablet     . lidocaine-prilocaine (EMLA) cream Apply to affected area as needed 30 g 3  . LORazepam (ATIVAN) 0.5 MG tablet Take 1 tablet (0.5 mg total) by mouth every 6 (six) hours as needed (Nausea or vomiting). 30 tablet 0  . Nutritional Supplements (FEEDING SUPPLEMENT, OSMOLITE 1.5 CAL,) LIQD Begin Osmolite 1.5 or equivalent via PEG, 1 can QID with 120 cc free water before and after bolus. Increase by one can every 2 days until goal of 7 cans achieved. Give 30 mL promod or equivalent BID mixed with 6 oz water.  Flush with 60 cc free water after Promod. 7 Bottle 0  . ondansetron (ZOFRAN) 8 MG tablet Take 1 tablet (8 mg total) by  mouth 2 (two) times daily as needed. Start on the third day after chemotherapy. 30 tablet 1  . ranitidine (ZANTAC) 150 MG capsule Take 150 mg by mouth 2 (two) times daily.    . sucralfate (CARAFATE) 1 g tablet Dissolve 1 tablet in 10 mL H20 and swallow up to QID prn sore throat. 60 tablet 5  . terazosin (HYTRIN) 2 MG capsule Take 2 mg by mouth at bedtime.    . Wound Dressings (SONAFINE) Apply 1 application topically 3 (three) times daily.    Marland Kitchen aspirin 81 MG tablet Take 81 mg by mouth daily.    Marland Kitchen b complex vitamins tablet Take 1 tablet by mouth daily. Reported on 09/19/2015    . hydrochlorothiazide (HYDRODIURIL) 25 MG tablet Take 25 mg by mouth daily.    Marland Kitchen lidocaine (XYLOCAINE) 2 % solution Patient: Mix 1part 2%  viscous lidocaine, 1part H20. Swish and/or swallow 10mL of this mixture, before meals and at bedtime, up to QID (Patient not taking: Reported on 11/17/2015) 100 mL 5  . losartan (COZAAR) 50 MG tablet Take 50 mg by mouth daily.    . mucosal barrier oral (GELCLAIR) GEL Take 1 packet by mouth 3 (three) times daily. prn (Patient not taking: Reported on 11/17/2015) 30 packet 3  . Omega-3 Fatty Acids (FISH OIL) 1000 MG CAPS Take 1 capsule by mouth daily. Reported on 09/25/2015    . oxyCODONE (ROXICODONE) 5 MG/5ML solution Take 7.5 mLs (7.5 mg total) by mouth every 6 (six) hours as needed for severe pain. (Patient not taking: Reported on 11/17/2015) 473 mL 0  . Pantothenic Acid 500 MG TABS Take 1 tablet by mouth daily. Reported on 09/25/2015    . polyethylene glycol (MIRALAX / GLYCOLAX) packet Take 17 g by mouth daily.    . prochlorperazine (COMPAZINE) 10 MG tablet Take 1 tablet (10 mg total) by mouth every 6 (six) hours as needed (Nausea or vomiting). (Patient not taking: Reported on 11/17/2015) 30 tablet 1   No current facility-administered medications for this encounter.     Physical Findings: The patient is in no acute distress. Patient is alert and oriented. Wt Readings from Last 3 Encounters:  11/17/15 223 lb (101.2 kg)  11/09/15 223 lb (101.2 kg)  10/18/15 216 lb 12.8 oz (98.3 kg)    height is 6' (1.829 m) and weight is 223 lb (101.2 kg). His temperature is 98.7 F (37.1 C). His blood pressure is 129/94 (abnormal) and his pulse is 108 (abnormal). His oxygen saturation is 98%. .  General: Alert and oriented, in no acute distress HEENT: Head is normocephalic. Extraocular movements are intact. Oropharynx is notable for a white coating on his tongue that does not appear to be thrush. Mucous membranes are dry. Tongue is strong making exam difficult, but no obvious lesions noted. Neck: Neck is notable for modest anterior neck lymphedema. Not able to palpate cervical or supraclavicular adenopathy. Skin:  Skin in treatment fields shows satisfactory healing, but is dry in the neck and anterior chest. Lymphatics: see Neck Exam Psychiatric: Judgment and insight are intact. Affect is appropriate.  Lab Findings: Lab Results  Component Value Date   WBC 4.8 11/09/2015   HGB 11.0 (L) 11/09/2015   HCT 32.1 (L) 11/09/2015   MCV 99 (H) 11/09/2015   PLT 255 11/09/2015    Lab Results  Component Value Date   TSH 0.324 11/09/2015    Radiographic Findings: No results found.  Impression/Plan:    1) Head and Neck Cancer Status:  Healing well from treatment.  2) Nutritional Status: Gaining weight, but still has difficulty eating with thick saliva. He is not able to eat orally and takes in food via his PEG tube. Poor taste. PEG tube: Yes  3) Risk Factors: The patient has been educated about risk factors including alcohol and tobacco abuse; they understand that avoidance of alcohol and tobacco is important to prevent recurrences as well as other cancers  4) Swallowing: Only able to swallow water. Thick saliva.  5) Dental: Encouraged to continue regular followup with dentistry, and dental hygiene including fluoride rinses.  I advised him to resume using his fluoride trays.  6) Thyroid function:  Lab Results  Component Value Date   TSH 0.324 11/09/2015    7) Other: I spoke with the patient in regards to his poor quality of sleep. He is using his pain medication to help him sleep at night. He used to take Tylenol PM in the past that helped. I advised him to use the Tylenol PM instead of his Norco until he speaks with his PCP regarding his sleep issues.  He will see SLP, PT, nutrition, and social work at our next available [facility] clinic.  8) Follow-up in about 10-12 weeks with a PET scan for restaging. The patient was encouraged to call with any issues or questions before then.    Lonie Peak, MD  This document serves as a record of services personally performed by Lonie Peak, MD. It was  created on her behalf by Eustace Moore, a trained medical scribe. The creation of this record is based on the scribe's personal observations and the provider's statements to them. This document has been checked and approved by the attending provider.

## 2015-12-08 ENCOUNTER — Encounter: Payer: Self-pay | Admitting: Hematology & Oncology

## 2015-12-08 ENCOUNTER — Other Ambulatory Visit (HOSPITAL_BASED_OUTPATIENT_CLINIC_OR_DEPARTMENT_OTHER): Payer: Medicare Other

## 2015-12-08 ENCOUNTER — Ambulatory Visit (HOSPITAL_BASED_OUTPATIENT_CLINIC_OR_DEPARTMENT_OTHER): Payer: Medicare Other | Admitting: Hematology & Oncology

## 2015-12-08 ENCOUNTER — Ambulatory Visit (HOSPITAL_BASED_OUTPATIENT_CLINIC_OR_DEPARTMENT_OTHER): Payer: Medicare Other

## 2015-12-08 VITALS — BP 114/84 | HR 107 | Temp 98.0°F | Resp 16 | Ht 72.0 in | Wt 226.0 lb

## 2015-12-08 DIAGNOSIS — Z23 Encounter for immunization: Secondary | ICD-10-CM | POA: Diagnosis not present

## 2015-12-08 DIAGNOSIS — C099 Malignant neoplasm of tonsil, unspecified: Secondary | ICD-10-CM | POA: Diagnosis not present

## 2015-12-08 DIAGNOSIS — C090 Malignant neoplasm of tonsillar fossa: Secondary | ICD-10-CM | POA: Diagnosis not present

## 2015-12-08 DIAGNOSIS — K117 Disturbances of salivary secretion: Secondary | ICD-10-CM

## 2015-12-08 DIAGNOSIS — R682 Dry mouth, unspecified: Secondary | ICD-10-CM

## 2015-12-08 DIAGNOSIS — B977 Papillomavirus as the cause of diseases classified elsewhere: Secondary | ICD-10-CM

## 2015-12-08 LAB — CBC WITH DIFFERENTIAL (CANCER CENTER ONLY)
BASO#: 0 10*3/uL (ref 0.0–0.2)
BASO%: 0.2 % (ref 0.0–2.0)
EOS%: 2 % (ref 0.0–7.0)
Eosinophils Absolute: 0.1 10*3/uL (ref 0.0–0.5)
HEMATOCRIT: 36.5 % — AB (ref 38.7–49.9)
HGB: 12.7 g/dL — ABNORMAL LOW (ref 13.0–17.1)
LYMPH#: 0.5 10*3/uL — AB (ref 0.9–3.3)
LYMPH%: 8.2 % — ABNORMAL LOW (ref 14.0–48.0)
MCH: 35.1 pg — ABNORMAL HIGH (ref 28.0–33.4)
MCHC: 34.8 g/dL (ref 32.0–35.9)
MCV: 101 fL — ABNORMAL HIGH (ref 82–98)
MONO#: 0.7 10*3/uL (ref 0.1–0.9)
MONO%: 11.3 % (ref 0.0–13.0)
NEUT#: 4.7 10*3/uL (ref 1.5–6.5)
NEUT%: 78.3 % (ref 40.0–80.0)
PLATELETS: 224 10*3/uL (ref 145–400)
RBC: 3.62 10*6/uL — ABNORMAL LOW (ref 4.20–5.70)
RDW: 14.2 % (ref 11.1–15.7)
WBC: 6 10*3/uL (ref 4.0–10.0)

## 2015-12-08 LAB — COMPREHENSIVE METABOLIC PANEL
ALT: 18 U/L (ref 0–55)
AST: 20 U/L (ref 5–34)
Albumin: 3.6 g/dL (ref 3.5–5.0)
Alkaline Phosphatase: 69 U/L (ref 40–150)
Anion Gap: 9 mEq/L (ref 3–11)
BUN: 22.9 mg/dL (ref 7.0–26.0)
CALCIUM: 10 mg/dL (ref 8.4–10.4)
CHLORIDE: 102 meq/L (ref 98–109)
CO2: 28 meq/L (ref 22–29)
CREATININE: 0.9 mg/dL (ref 0.7–1.3)
EGFR: 86 mL/min/{1.73_m2} — ABNORMAL LOW (ref >90)
GLUCOSE: 138 mg/dL (ref 70–140)
Potassium: 4.1 mEq/L (ref 3.5–5.1)
Sodium: 140 mEq/L (ref 136–145)
Total Bilirubin: 0.37 mg/dL (ref 0.20–1.20)
Total Protein: 6.9 g/dL (ref 6.4–8.3)

## 2015-12-08 MED ORDER — SODIUM CHLORIDE 0.9 % IJ SOLN
10.0000 mL | INTRAMUSCULAR | Status: DC | PRN
  Administered 2015-12-08: 10 mL
  Filled 2015-12-08: qty 10

## 2015-12-08 MED ORDER — HEPARIN SOD (PORK) LOCK FLUSH 100 UNIT/ML IV SOLN
500.0000 [IU] | Freq: Once | INTRAVENOUS | Status: AC | PRN
  Administered 2015-12-08: 500 [IU]
  Filled 2015-12-08: qty 5

## 2015-12-08 MED ORDER — INFLUENZA VAC SPLIT QUAD 0.5 ML IM SUSY
0.5000 mL | PREFILLED_SYRINGE | Freq: Once | INTRAMUSCULAR | Status: AC
  Administered 2015-12-08: 0.5 mL via INTRAMUSCULAR
  Filled 2015-12-08: qty 0.5

## 2015-12-08 MED ORDER — PILOCARPINE HCL 5 MG PO TABS: 5.0000 mg | ORAL_TABLET | Freq: Two times a day (BID) | ORAL | 3 refills | Status: DC

## 2015-12-08 NOTE — Progress Notes (Signed)
Hematology and Oncology Follow Up Visit  Randy Hanson [id number] Jan 20, 1941 75 y.o. 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 He continues to improve. His mouth is still very dry. He still has a lot of mucus. He is having a hard time swallowing anything solid. He is taking liquids. He is still using his feeding tube.  He does have a lot of subcutaneous edema under his chin. I told him this was common with radiation.  He is not having any pain. He's not taking any pain medication.  He's had no problems with diarrhea. He's had no nausea.  He's had no bleeding.   Overall, his performance status is ECOG 1.     Medications:    Medication List       Accurate as of 12/08/15 11:25 AM. Always use your most recent med list.          aspirin 81 MG tablet Take 81 mg by mouth daily.   docusate sodium 100 MG capsule Commonly known as:  COLACE Take 100 mg by mouth 2 (two) times daily.   feeding supplement (OSMOLITE 1.5 CAL) Liqd Begin Osmolite 1.5 or equivalent via PEG, 1 can QID with 120 cc free water before and after bolus. Increase by one can every 2 days until goal of 7 cans achieved. Give 30 mL promod or equivalent BID mixed with 6 oz water.  Flush with 60 cc free water after Promod.   hydrochlorothiazide 25 MG tablet Commonly known as:  HYDRODIURIL Take 25 mg by mouth daily.   HYDROcodone-acetaminophen 5-325 MG tablet Commonly known as:  NORCO/VICODIN   lidocaine 2 % solution Commonly known as:  XYLOCAINE Patient: Mix 1part 2% viscous lidocaine, 1part H20. Swish and/or swallow 10mL of this mixture, before meals and at bedtime, up to QID   lidocaine-prilocaine cream Commonly known as:  EMLA Apply to affected area as needed     LORazepam 0.5 MG tablet Commonly known as:  ATIVAN Take 1 tablet (0.5 mg total) by mouth every 6 (six) hours as needed (Nausea or vomiting).   losartan 50 MG tablet Commonly known as:  COZAAR Take 50 mg by mouth daily.   mucosal barrier oral Gel Take 1 packet by mouth 3 (three) times daily. prn   ondansetron 8 MG tablet Commonly known as:  ZOFRAN Take 1 tablet (8 mg total) by mouth 2 (two) times daily as needed. Start on the third day after chemotherapy.   oxyCODONE 5 MG/5ML solution Commonly known as:  ROXICODONE Take 7.5 mLs (7.5 mg total) by mouth every 6 (six) hours as needed for severe pain.   pilocarpine 5 MG tablet Commonly known as:  SALAGEN Take 1 tablet (5 mg total) by mouth 2 (two) times daily.   prochlorperazine 10 MG tablet Commonly known as:  COMPAZINE Take 1 tablet (10 mg total) by mouth every 6 (six) hours as needed (Nausea or vomiting).   ranitidine 150 MG capsule Commonly known as:  ZANTAC Take 150 mg by mouth 2 (two) times daily.   SONAFINE Apply 1 application topically 3 (three) times daily.   terazosin 2 MG capsule Commonly known as:  HYTRIN Take 2 mg by mouth at bedtime.  Allergies:  Allergies  Allergen Reactions  . Lisinopril Cough    Past Medical History, Surgical history, Social history, and Family History were reviewed and updated.  Review of Systems: All other 10 point review of systems is negative.   Physical Exam:  height is 6' (1.829 m) and weight is 226 lb (102.5 kg). His oral temperature is 98 F (36.7 C). His blood pressure is 114/84 and his pulse is 107 (abnormal). His respiration is 16.   Wt Readings from Last 3 Encounters:  12/08/15 226 lb (102.5 kg)  11/17/15 223 lb (101.2 kg)  11/09/15 223 lb (101.2 kg)    Head and neck exam shows no ocular lesions. He does have minimal erythema and mucositis in the soft palate. His oral mucosa is somewhat dry. He has erythema on his neck. There is no radiation dermatitis  breakdown on his neck.No obvious adenopathy is noted on the neck. Thyroid is nonpalpable. Lungs are clear. Cardiac exam regular rate and rhythm with no murmurs, rubs or bruits. Abdomen is soft. His feeding tube is in the upper left quadrant. This is intact. He has decent bowel sounds. He has some slight tenderness at the G-tube site. There is no palpable liver or spleen tip. Back exam shows no tenderness over the spine, ribs or hips. Extremities shows no clubbing, cyanosis or edema. Skin exam is slightly dry. Neurological exam shows no focal neurological deficits.   Lab Results  Component Value Date   WBC 6.0 12/08/2015   HGB 12.7 (L) 12/08/2015   HCT 36.5 (L) 12/08/2015   MCV 101 (H) 12/08/2015   PLT 224 12/08/2015   No results found for: FERRITIN, IRON, TIBC, UIBC, IRONPCTSAT Lab Results  Component Value Date   RBC 3.62 (L) 12/08/2015   No results found for: KPAFRELGTCHN, LAMBDASER, KAPLAMBRATIO No results found for: IGGSERUM, IGA, IGMSERUM No results found for: Dorene Ar, A1GS, A2GS, Colin Benton, MSPIKE, SPEI   Chemistry      Component Value Date/Time   NA 137 11/09/2015 1249   K 4.2 11/09/2015 1249   CL 98 10/18/2015 1023   CO2 26 11/09/2015 1249   BUN 22.8 11/09/2015 1249   CREATININE 0.8 11/09/2015 1249      Component Value Date/Time   CALCIUM 9.5 11/09/2015 1249   ALKPHOS 76 11/09/2015 1249   AST 18 11/09/2015 1249   ALT 14 11/09/2015 1249   BILITOT <0.30 11/09/2015 1249     Impression and Plan: Randy Hanson is a very pleasant 75 yo white male with stage I-HPV+ - squamous cell carcinoma the right tonsil. He completed radiation and chemotherapy in early August.   We now have to get him set up with a PET scan. This will be done in November. We will get this done at [facility].  I really have to believe that he will be in remission.  Hopefully, we see him back, he will be eating some solid food so he get that feeding tube removed.   Hopefully,  the Salagen will help with him being able to swallow.    [name], MD 9/29/201711:25 AM

## 2015-12-08 NOTE — Progress Notes (Signed)
Wyland L Maland presented for Portacath access and flush. Proper placement of portacath confirmed by CXR. Portacath located in the right chest wall accessed with  H 20 needle. Clean, Dry and Intact Good blood return present. Portacath flushed with 51ml NS and 500U/66ml Heparin per protocol and needle removed intact. Procedure without incident. Patient tolerated procedure well.

## 2015-12-08 NOTE — Patient Instructions (Signed)

## 2015-12-11 ENCOUNTER — Ambulatory Visit: Payer: Medicare Other

## 2015-12-11 ENCOUNTER — Ambulatory Visit: Payer: Medicare Other | Attending: Radiation Oncology

## 2015-12-11 DIAGNOSIS — R293 Abnormal posture: Secondary | ICD-10-CM | POA: Diagnosis not present

## 2015-12-11 DIAGNOSIS — I890 Lymphedema, not elsewhere classified: Secondary | ICD-10-CM | POA: Insufficient documentation

## 2015-12-11 DIAGNOSIS — R29898 Other symptoms and signs involving the musculoskeletal system: Secondary | ICD-10-CM | POA: Insufficient documentation

## 2015-12-11 DIAGNOSIS — R1319 Other dysphagia: Secondary | ICD-10-CM | POA: Diagnosis not present

## 2015-12-11 NOTE — Therapy (Signed)
[facility] [address] [place], [place], [address] Phone: [phone]   Fax:  [phone]  Speech Language Pathology Treatment  Patient Details  Name: Randy Hanson MRN: [id number] Date of Birth: 10/28/1940 Referring Provider: Lonie Peak MD  Encounter Date: 12/11/2015      End of Session - 12/11/15 1709    Visit Number 3   Number of Visits 5   Date for SLP Re-Evaluation 02/23/16   SLP Start Time 1458  pt 10 minutes late   SLP Stop Time  1533   SLP Time Calculation (min) 35 min   Activity Tolerance Patient tolerated treatment well      Past Medical History:  Diagnosis Date  . Back pain   . BPH (benign prostatic hyperplasia)   . 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 DJD (degenerative joint disease)   . Esophageal reflux   . FHx: colonic polyps   . Hemorrhoids   . Hypercholesteremia   . Hyperlipemia   . Hypertension   . Kidney cysts    [facility] [place] [facility]2012  . Posterior vitreous detachment, left eye    DR. KATHERINE HECKER  AUGUST 2011  . Radiation 08/28/15- 10/16/15   Right Tonsil and Bilateral Neck    Past Surgical History:  Procedure Laterality Date  . CARDIAC CATHETERIZATION     remote >15 years ago  . IR GENERIC HISTORICAL  10/11/2015   IR PATIENT EVAL TECH 0-60 MINS 10/11/2015 Darrell K Allred, PA-C [facility]INTERV RAD  . VASECTOMY  1975    There were no vitals filed for this visit.      Subjective Assessment - 12/11/15 1459    Subjective "(Dr. Myna Hidalgo) gave me some medicine for (the thick phlegm)."   Patient is accompained by: --  wife   Currently in Pain? No/denies               ADULT SLP TREATMENT - 12/11/15 1515      General Information   Behavior/Cognition Alert;Cooperative;Pleasant mood     Treatment Provided   Treatment provided Dysphagia     Dysphagia Treatment   Temperature Spikes Noted No   Respiratory Status Room air   Treatment Methods  Skilled observation;Therapeutic exercise;Patient/caregiver education   Patient observed directly with PO's Yes   Type of PO's observed Thin liquids   Pharyngeal Phase Signs & Symptoms Delayed throat clear;Immediate throat clear  unsure if due to thick phlegm   Other treatment/comments Pt reports significant xerostomia with thick saliva/phlegm. Pt has had H2O routinely, without overt s/s aspiration PNA reported. None observed today. SLP used skilled observation with pt's POs today including chicken broth as well as H2O. Pt with immediate throat clears and delayed throat clears with thin liquids, question due to phlegm or dueto airway compromise. Pt states when he does "get impatient" with water he will cough due to something "going down the wrong way" but feels throat clearing is due to phlegm today. Pt req'd usual verbal and demo cues with HEP, as he has not completed HEP at prescribed frequency. Pt told SLP 3 overt s/s aspiration PNA with modified independence.     Assessment / Recommendations / Plan   Plan Continue with current plan of care     Dysphagia Recommendations   Diet recommendations Thin liquid;Dysphagia 1 (puree)   Compensations Effortful swallow     Progression Toward Goals   Progression toward goals Progressing toward goals  SLP Education - 12/11/15 1708    Education provided Yes   Education Details HEP, eating purees, thins, late effects head/neck radiation on swallowing, s/s aspiration PNa   Person(s) Educated Patient;Spouse   Methods Explanation;Demonstration;Verbal cues;Handout   Comprehension Returned demonstration;Verbalized understanding;Verbal cues required          SLP Short Term Goals - 12/11/15 1713      SLP SHORT TERM GOAL #1   Title pt will demo HEP with rare min A   Time 1   Period --  visit   Status On-going  not met; continue to November 2017     SLP SHORT TERM GOAL #2   Title pt will tell SLP why he is completing HEP   Time 1    Period --  visit   Status On-going  not met; cont to November 2017          SLP Long Term Goals - 12/11/15 1715      SLP LONG TERM GOAL #1   Title pt will complete HEP with modified independence   Time 2   Period --  visits (until visit in Dec 2017)   Status On-going     SLP LONG TERM GOAL #2   Title pt will tell SLP how a food journal can assist pt in returining to more normalized food intake   Time 2   Period --  visits (until December 2017)   Status On-going     SLP LONG TERM GOAL #3   Title pt will tell SLP 3 overt s/s aspiration PNA over two sessions   Baseline one session 12-11-15   Time 2   Period --  visits (until December 2017)   Status New          Plan - 12/11/15 1710    Clinical Impression Statement Pt presents with functional swallowing for thin liquids with small sips - SLP suggested pt begin to have more POs. Believe pt's throat clearing is more due to thick saliva/phlegm than airway compromise. Due to limited visits thus far, pt has not met all STGs or LTGs. Recommend pt continue skilled ST to address both correct completion of HEP and assessment of safety of POs.   Speech Therapy Frequency --  approx once every four weeks   Duration --  2 more therapy sessions (5 total) prior to Dec 15th   Treatment/Interventions Aspiration precaution training;Pharyngeal strengthening exercises;Diet toleration management by SLP;Trials of upgraded texture/liquids;Compensatory techniques;Patient/family education;SLP instruction and feedback   Potential to Achieve Goals Good   SLP Home Exercise Plan provided today   Consulted and Agree with Plan of Care Patient      Patient will benefit from skilled therapeutic intervention in order to improve the following deficits and impairments:   Other dysphagia    Problem List Patient Active Problem List   Diagnosis Date Noted  . Carcinoma of tonsillar fossa (HCC) 07/19/2015  . 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 Abnormal EKG 04/22/2014  . Essential hypertension 04/22/2014  . Hyperlipidemia 04/22/2014    [facility] ,MS, CCC-SLP  12/11/2015, 5:18 PM  [facility] [address] [place], [place], [address] Phone: [phone]   Fax:  [phone]   Name: Randy Hanson MRN: [id number] Date of Birth: 02-23-1941

## 2015-12-12 ENCOUNTER — Telehealth: Payer: Self-pay | Admitting: *Deleted

## 2015-12-12 NOTE — Telephone Encounter (Signed)
Oncology Nurse Navigator Documentation  Confirmed with Mr. Kattner his attendance at next Tuesday morning's H&N [facility] to be seen by Nutrition, PT and SW.  He understands to register and arrive by 0900 to Radiation Waiting.  Gayleen Orem, RN, BSN, [place] at [place] [phone]

## 2015-12-18 ENCOUNTER — Telehealth: Payer: Self-pay | Admitting: *Deleted

## 2015-12-18 NOTE — Telephone Encounter (Signed)
Oncology Nurse Navigator Documentation  Spoke with Randy Hanson, confirmed his attendance t H&N [place] tomorrow morning with a 0900 arrival.    Gayleen Orem, RN, BSN, [place] at [place] [phone]

## 2015-12-19 ENCOUNTER — Encounter: Payer: Self-pay | Admitting: *Deleted

## 2015-12-19 ENCOUNTER — Ambulatory Visit: Payer: Medicare Other | Admitting: Nutrition

## 2015-12-19 ENCOUNTER — Ambulatory Visit
Admission: RE | Admit: 2015-12-19 | Discharge: 2015-12-19 | Disposition: A | Discharge status: Home / Self Care | Payer: Non-veteran care | Source: Ambulatory Visit | Attending: Radiation Oncology | Admitting: Radiation Oncology

## 2015-12-19 ENCOUNTER — Ambulatory Visit: Payer: Medicare Other

## 2015-12-19 ENCOUNTER — Ambulatory Visit: Payer: Medicare Other | Admitting: Physical Therapy

## 2015-12-19 DIAGNOSIS — R29898 Other symptoms and signs involving the musculoskeletal system: Secondary | ICD-10-CM | POA: Diagnosis not present

## 2015-12-19 DIAGNOSIS — C090 Malignant neoplasm of tonsillar fossa: Secondary | ICD-10-CM | POA: Insufficient documentation

## 2015-12-19 DIAGNOSIS — I890 Lymphedema, not elsewhere classified: Secondary | ICD-10-CM

## 2015-12-19 DIAGNOSIS — R1319 Other dysphagia: Secondary | ICD-10-CM | POA: Diagnosis not present

## 2015-12-19 DIAGNOSIS — E7800 Pure hypercholesterolemia, unspecified: Secondary | ICD-10-CM | POA: Insufficient documentation

## 2015-12-19 DIAGNOSIS — R293 Abnormal posture: Secondary | ICD-10-CM

## 2015-12-19 DIAGNOSIS — I10 Essential (primary) hypertension: Secondary | ICD-10-CM | POA: Insufficient documentation

## 2015-12-19 DIAGNOSIS — K219 Gastro-esophageal reflux disease without esophagitis: Secondary | ICD-10-CM | POA: Insufficient documentation

## 2015-12-19 DIAGNOSIS — Z510 Encounter for antineoplastic radiation therapy: Secondary | ICD-10-CM | POA: Insufficient documentation

## 2015-12-19 DIAGNOSIS — E785 Hyperlipidemia, unspecified: Secondary | ICD-10-CM | POA: Insufficient documentation

## 2015-12-19 NOTE — Progress Notes (Signed)
Oncology Nurse Navigator Documentation  Met with Mr. Aguila during H&N [place].  He was accompanied by his wife.  Arrived him to Nursing, provided verbal and written overview of [place], the clinicians who will be seeing him, encouraged him to ask questions during his time with them.  He was seen by Nutrition, PT and SW.  Spoke with him at end of [facility], addressed questions. He understands I can be contacted with needs/concerns.  Gayleen Orem, RN, BSN, [place] at [place] [phone]

## 2015-12-19 NOTE — Therapy (Signed)
[place], [place], [address] Phone: [phone]   Fax:  [phone]  Physical Therapy Evaluation  Patient Details  Name: Randy Hanson MRN: [id number] Date of Birth: 09/10/1940 Referring Provider: Eppie Gibson, MD  Encounter Date: 12/19/2015      PT End of Session - 12/19/15 1007    Visit Number 1   Number of Visits 9   Date for PT Re-Evaluation 01/19/16   PT Start Time 0932   PT Stop Time 0953   PT Time Calculation (min) 21 min   Activity Tolerance Patient tolerated treatment well   Behavior During Therapy [facility] for tasks assessed/performed      Past Medical History:  Diagnosis Date  . Back pain   . BPH (benign prostatic hyperplasia)   . Cancer of tonsil, palatine ([place]) 07/12/2015  . DJD (degenerative joint disease)   . Esophageal reflux   . FHx: colonic polyps   . Hemorrhoids   . Hypercholesteremia   . Hyperlipemia   . Hypertension   . Kidney cysts    [facility] [place] [facility]  . Posterior vitreous detachment, left eye    DR. KATHERINE HECKER  AUGUST 2011  . Radiation 08/28/15- 10/16/15   Right Tonsil and Bilateral Neck    Past Surgical History:  Procedure Laterality Date  . CARDIAC CATHETERIZATION     remote >15 years ago  . IR GENERIC HISTORICAL  10/11/2015   IR PATIENT EVAL TECH 0-60 MINS 10/11/2015 Darrell K Allred, PA-C [facility]-INTERV RAD  . VASECTOMY  1975    There were no vitals filed for this visit.       Subjective Assessment - 12/19/15 0958    Subjective Reports swelling in the front of his neck. He has gained 20 pounds since he began his cancer treatment.   Patient is accompained by: Family member  wife   Pertinent History Diagnosed with right tonsil squamous cell carcinoma, P16 positive. Completed chemotherapy and radiation on 10/16/15.   Patient Stated Goals to get swelling at front of neck out   Currently in Pain? No/denies            [facility] PT  Assessment - 12/19/15 0001      Assessment   Medical Diagnosis right tonsil SCC   Referring Provider Eppie Gibson, MD     Precautions   Precaution Comments cancer precautions     Restrictions   Weight Bearing Restrictions No     Balance Screen   Has the patient fallen in the past 6 months No   Has the patient had a decrease in activity level because of a fear of falling?  No   Is the patient reluctant to leave their home because of a fear of falling?  No     Home Environment   Living Environment Private residence   Living Arrangements Spouse/significant other   Type of [place] to enter   Home Layout Laundry or work area in basement     Prior Function   Level of Independence Independent   Vocation Full time employment  not working now, but hopes to return   Research scientist (life sciences)   Leisure no regular exercise     Cognition   Overall Cognitive Status Within Functional Limits for tasks assessed     Observation/Other Assessments   Observations noticeable swelling at anterior neck that is firm upon palpation     Functional Tests  Clinical Impairments Affecting Rehab Potential previous radiation   PT Frequency 2x / week   PT Duration 4 weeks   PT Treatment/Interventions ADLs/Self Care Home Management;Therapeutic exercise;Therapeutic activities;Gait training;DME Instruction;Patient/family education;Manual techniques;Manual lymph drainage;Scar mobilization;Passive range of motion;Taping   PT Next Visit Plan begin manual lymph drainage, educate patient on compression garments   Consulted and Agree with Plan of Care Patient      Patient will benefit from skilled therapeutic intervention in order to improve the following deficits and impairments:  Decreased range of motion, Decreased endurance, Increased edema, Postural dysfunction  Visit Diagnosis: Lymphedema, not elsewhere classified  Abnormal posture  Other symptoms and signs involving the musculoskeletal system     Problem List Patient Active Problem List   Diagnosis Date Noted  . Carcinoma of tonsillar fossa ([place]) 07/19/2015  . Cancer of tonsil, palatine ([place]) 07/12/2015  . Abnormal EKG 04/22/2014  . Essential hypertension 04/22/2014  . Hyperlipidemia 04/22/2014    Mell[facility] Life 12/19/2015, 10:17 AM  [place], [place], [address] Phone: [phone]   Fax:  [phone]  Name: Randy Hanson MRN: [id number] Date of Birth: 07-21-1940  Saverio Danker, SPT  This entire session was guided, instructed, and directly supervised by Serafina Royals, PT.  Read, reviewed, edited and agree with student's findings and recommendations.   Serafina Royals, PT 12/19/15 11:03 AM  Clinical Impairments Affecting Rehab Potential previous radiation   PT Frequency 2x / week   PT Duration 4 weeks   PT Treatment/Interventions ADLs/Self Care Home Management;Therapeutic exercise;Therapeutic activities;Gait training;DME Instruction;Patient/family education;Manual techniques;Manual lymph drainage;Scar mobilization;Passive range of motion;Taping   PT Next Visit Plan begin manual lymph drainage, educate patient on compression garments   Consulted and Agree with Plan of Care Patient      Patient will benefit from skilled therapeutic intervention in order to improve the following deficits and impairments:  Decreased range of motion, Decreased endurance, Increased edema, Postural dysfunction  Visit Diagnosis: Lymphedema, not elsewhere classified  Abnormal posture  Other symptoms and signs involving the musculoskeletal system     Problem List Patient Active Problem List   Diagnosis Date Noted  . Carcinoma of tonsillar fossa ([place]) 07/19/2015  . Cancer of tonsil, palatine ([place]) 07/12/2015  . Abnormal EKG 04/22/2014  . Essential hypertension 04/22/2014  . Hyperlipidemia 04/22/2014    [facility] Life 12/19/2015, 10:17 AM  [place], [place], [address] Phone: [phone]   Fax:  [phone]  Name: Randy Hanson MRN: [id number] Date of Birth: 07-21-1940  Saverio Danker, SPT  This entire session was guided, instructed, and directly supervised by Serafina Royals, PT.  Read, reviewed, edited and agree with student's findings and recommendations.   Serafina Royals, PT 12/19/15 11:03 AM

## 2015-12-19 NOTE — Progress Notes (Signed)
VS entry.

## 2015-12-19 NOTE — Progress Notes (Signed)
Patient was seen in head and neck clinic for follow-up on tonsil cancer.  Patient completed treatment on August 7. He is still using feeding tube for enteral nutrition. Weight has improved and was documented as 229 pounds.  Patient verbalizes concern with weight gain. He denies any problems with tube feeding tolerance. He is struggling to increase oral intake.  Nutrition diagnosis: Inadequate oral intake continues. Severe malnutrition has resolved.  Revised estimated nutrition needs: 2150-2350 calories, 115-125 grams protein, 2.4 L fluid.  Intervention: Educated patient on strategies for increasing oral intake, especially of protein foods and discuss specifics of fluids and soft foods patient should add to his diet. Recommended decreasing goal rate of tube feeding to goal rate of 6 and1/2 cans of Osmolite 1.5 daily in order to prevent further weight gain.  This will provide 2300 cal, 97 g protein, 1171 mL free water. Patient will try to drink one can of Ensure Plus by mouth as a substitute for one can of Osmolite 1.5 through feeding tube. Questions were answered.  Teach back method used.  Monitoring, evaluation, goals: Patient will gradually increase oral intake and decrease tube feeding to maintain current weight and promote continued healing.  Next visit: Patient will call me by phone as needed.  **Disclaimer: This note was dictated with voice recognition software. Similar sounding words can inadvertently be transcribed and this note may contain transcription errors which may not have been corrected upon publication of note.**

## 2015-12-20 ENCOUNTER — Ambulatory Visit: Payer: Medicare Other | Admitting: Physical Therapy

## 2015-12-20 DIAGNOSIS — I890 Lymphedema, not elsewhere classified: Secondary | ICD-10-CM

## 2015-12-20 DIAGNOSIS — R293 Abnormal posture: Secondary | ICD-10-CM

## 2015-12-20 DIAGNOSIS — R29898 Other symptoms and signs involving the musculoskeletal system: Secondary | ICD-10-CM | POA: Diagnosis not present

## 2015-12-20 DIAGNOSIS — R1319 Other dysphagia: Secondary | ICD-10-CM | POA: Diagnosis not present

## 2015-12-20 NOTE — Therapy (Signed)
[place], [place], [address] Phone: [phone]   Fax:  [phone]  Physical Therapy Treatment  Patient Details  Name: Randy Hanson MRN: [id number] Date of Birth: January 19, 1941 Referring Provider: Eppie Gibson, MD  Encounter Date: 12/20/2015      PT End of Session - 12/20/15 [id number]    Visit Number 2   Number of Visits 9   Date for PT Re-Evaluation 01/19/16   PT Start Time 1600   PT Stop Time 1641   PT Time Calculation (min) 41 min   Activity Tolerance Patient tolerated treatment well   Behavior During Therapy [facility] for tasks assessed/performed      Past Medical History:  Diagnosis Date  . Back pain   . BPH (benign prostatic hyperplasia)   . Cancer of tonsil, palatine ([place]) 07/12/2015  . DJD (degenerative joint disease)   . Esophageal reflux   . FHx: colonic polyps   . Hemorrhoids   . Hypercholesteremia   . Hyperlipemia   . Hypertension   . Kidney cysts    [facility] [place] [facility]  . Posterior vitreous detachment, left eye    DR. KATHERINE HECKER  AUGUST 2011  . Radiation 08/28/15- 10/16/15   Right Tonsil and Bilateral Neck    Past Surgical History:  Procedure Laterality Date  . CARDIAC CATHETERIZATION     remote >15 years ago  . IR GENERIC HISTORICAL  10/11/2015   IR PATIENT EVAL TECH 0-60 MINS 10/11/2015 Darrell K Allred, PA-C [facility]INTERV RAD  . VASECTOMY  1975    There were no vitals filed for this visit.      Subjective Assessment - 12/20/15 1602    Subjective Has been wearing the chip pack at home and it really seemed to help. Wore it for 2 hours today.   Patient is accompained by: Family member   Pertinent History Diagnosed with right tonsil squamous cell carcinoma, P16 positive. Completed chemotherapy and radiation on 10/16/15.   Patient Stated Goals to get swelling at front of neck out   Currently in Pain? No/denies               LYMPHEDEMA/ONCOLOGY  QUESTIONNAIRE - 12/19/15 1009      Head and Neck   4 cm superior to sternal notch around neck 48 cm   6 cm superior to sternal notch around neck 50.3 cm   8 cm superior to sternal notch around neck 51.1 cm                  [facility] Adult PT Treatment/Exercise - 12/20/15 0001      Manual Therapy   Manual Therapy Manual Lymphatic Drainage (MLD)   Manual Lymphatic Drainage (MLD) In recline on elevated mat: supraclavicular fossae, bilateral axillae, bilateral shoulder collectors; posterolateral, lateral, anteriolateral, and anterior neck; chin; cheek; then reinforcing pathways down to axillae.                PT Education - 12/20/15 1644    Education provided Yes   Education Details instructed patient in performing cervical extension ROM exercise and chin retractions at home   Person(s) Educated Patient;Spouse   Methods Explanation;Demonstration   Comprehension Verbalized understanding;Returned demonstration                [facility] Goals - 12/19/15 1015      CC Long Term Goal  #1   Title Decrease circumference measurement at 8 cm proximal to sternal  notch to 49.5 cm or less.   Time 4   Period Weeks   Status New     CC Long Term Goal  #2   Title Patient will be knowledgeable about lymphedema risk reduction practices.   Time 4   Period Weeks   Status New     CC Long Term Goal  #3   Title Patient will be knowledgeable about compression garments and how to obtain them.   Time 4   Period Weeks   Status New     CC Long Term Goal  #4   Title Patient and/or patient's wife will be independent in self manual lymph drainage.   Time 4   Period Weeks   Status New         Head and Neck Clinic Goals - 09/05/15 1149      Patient will be able to verbalize understanding of a home exercise program for cervical range of motion, posture, and walking.    Status Achieved     Patient will be able to verbalize understanding of proper sitting and  standing posture.    Status Achieved     Patient will be able to verbalize understanding of lymphedema risk and availability of treatment for this condition.    Status Achieved           Plan - 12/20/15 1645    Clinical Impression Statement Patient reports the chip pack therapist provided him yesterday really seemed to help reduce some of the swelling. Therapist began manual lymph drainage today while educating patient on basic lymphatic anatomy and principles of manual lymph drainage. Therapist will begin instructing patient and his wife in performance of MLD next session.   Rehab Potential Good   Clinical Impairments Affecting Rehab Potential previous radiation   PT Frequency 2x / week   PT Duration 4 weeks   PT Treatment/Interventions ADLs/Self Care Home Management;Therapeutic exercise;Therapeutic activities;Gait training;DME Instruction;Patient/family education;Manual techniques;Manual lymph drainage;Scar mobilization;Passive range of motion;Taping   PT Next Visit Plan continue manual lymph drainage and begin instructing pt/pt's wife in this, educate patient on compression garments   PT Home Exercise Plan cervical extension AROM, chin retractions   Consulted and Agree with Plan of Care Patient      Patient will benefit from skilled therapeutic intervention in order to improve the following deficits and impairments:  Decreased range of motion, Decreased endurance, Increased edema, Postural dysfunction  Visit Diagnosis: Lymphedema, not elsewhere classified  Abnormal posture  Other symptoms and signs involving the musculoskeletal system       G-Codes - 2016-01-13 1058    Functional Assessment Tool Used clinical judgement   Functional Limitation Self care   Self Care Current Status [id number]) At least 80 percent but less than 100 percent impaired, limited or restricted   Self Care Goal Status [id number]) At least 1 percent but less than 20 percent impaired, limited or  restricted      Problem List Patient Active Problem List   Diagnosis Date Noted  . Carcinoma of tonsillar fossa ([place]) 07/19/2015  . Cancer of tonsil, palatine ([place]) 07/12/2015  . Abnormal EKG 04/22/2014  . Essential hypertension 04/22/2014  . Hyperlipidemia 04/22/2014    [facility] 12/20/2015, 4:48 PM  [place], [place], [address] Phone: [phone]   Fax:  [phone]  Name: KAESEN SHELLENBARGER MRN: [id number] Date of Birth: 1940-06-04   Saverio Danker, SPT  This entire session was guided, instructed, and directly  [place], [place], [address] Phone: [phone]   Fax:  [phone]  Physical Therapy Treatment  Patient Details  Name: Randy Hanson MRN: [id number] Date of Birth: January 19, 1941 Referring Provider: Eppie Gibson, MD  Encounter Date: 12/20/2015      PT End of Session - 12/20/15 [id number]    Visit Number 2   Number of Visits 9   Date for PT Re-Evaluation 01/19/16   PT Start Time 1600   PT Stop Time 1641   PT Time Calculation (min) 41 min   Activity Tolerance Patient tolerated treatment well   Behavior During Therapy [facility] for tasks assessed/performed      Past Medical History:  Diagnosis Date  . Back pain   . BPH (benign prostatic hyperplasia)   . Cancer of tonsil, palatine ([place]) 07/12/2015  . DJD (degenerative joint disease)   . Esophageal reflux   . FHx: colonic polyps   . Hemorrhoids   . Hypercholesteremia   . Hyperlipemia   . Hypertension   . Kidney cysts    [facility] [place] [facility]-2012  . Posterior vitreous detachment, left eye    DR. KATHERINE HECKER  AUGUST 2011  . Radiation 08/28/15- 10/16/15   Right Tonsil and Bilateral Neck    Past Surgical History:  Procedure Laterality Date  . CARDIAC CATHETERIZATION     remote >15 years ago  . IR GENERIC HISTORICAL  10/11/2015   IR PATIENT EVAL TECH 0-60 MINS 10/11/2015 Darrell K Allred, PA-C [facility]INTERV RAD  . VASECTOMY  1975    There were no vitals filed for this visit.      Subjective Assessment - 12/20/15 1602    Subjective Has been wearing the chip pack at home and it really seemed to help. Wore it for 2 hours today.   Patient is accompained by: Family member   Pertinent History Diagnosed with right tonsil squamous cell carcinoma, P16 positive. Completed chemotherapy and radiation on 10/16/15.   Patient Stated Goals to get swelling at front of neck out   Currently in Pain? No/denies               LYMPHEDEMA/ONCOLOGY  QUESTIONNAIRE - 12/19/15 1009      Head and Neck   4 cm superior to sternal notch around neck 48 cm   6 cm superior to sternal notch around neck 50.3 cm   8 cm superior to sternal notch around neck 51.1 cm                  [facility] Adult PT Treatment/Exercise - 12/20/15 0001      Manual Therapy   Manual Therapy Manual Lymphatic Drainage (MLD)   Manual Lymphatic Drainage (MLD) In recline on elevated mat: supraclavicular fossae, bilateral axillae, bilateral shoulder collectors; posterolateral, lateral, anteriolateral, and anterior neck; chin; cheek; then reinforcing pathways down to axillae.                PT Education - 12/20/15 1644    Education provided Yes   Education Details instructed patient in performing cervical extension ROM exercise and chin retractions at home   Person(s) Educated Patient;Spouse   Methods Explanation;Demonstration   Comprehension Verbalized understanding;Returned demonstration                [facility] Goals - 12/19/15 1015      CC Long Term Goal  #1   Title Decrease circumference measurement at 8 cm proximal to sternal

## 2015-12-26 ENCOUNTER — Ambulatory Visit: Payer: Medicare Other | Admitting: Physical Therapy

## 2015-12-26 DIAGNOSIS — I890 Lymphedema, not elsewhere classified: Secondary | ICD-10-CM

## 2015-12-26 DIAGNOSIS — R29898 Other symptoms and signs involving the musculoskeletal system: Secondary | ICD-10-CM | POA: Diagnosis not present

## 2015-12-26 DIAGNOSIS — R293 Abnormal posture: Secondary | ICD-10-CM | POA: Diagnosis not present

## 2015-12-26 DIAGNOSIS — R1319 Other dysphagia: Secondary | ICD-10-CM | POA: Diagnosis not present

## 2015-12-26 NOTE — Therapy (Signed)
be knowledgeable about compression garments and how to obtain them.   Time 4   Period Weeks   Status New     CC Long Term Goal  #4   Title Patient and/or patient's wife will be independent in self manual lymph drainage.   Time 4   Period Weeks   Status New         Head and Neck Clinic Goals - 09/05/15 1149      Patient will be able to verbalize understanding of a home exercise program for cervical range of motion, posture, and walking.    Status Achieved     Patient will be able to verbalize understanding of proper sitting and standing posture.    Status Achieved     Patient will be able to verbalize understanding of lymphedema risk and availability of treatment for this condition.    Status Achieved           Plan - 12/26/15 1442    Clinical Impression Statement Patient has continued to use chip pack but doesn't feel he would be able to sleep in it, and swelling is worst first thing in the morning.  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  We discussed him bringing that in at the next visit and letting us see if it seems like it would work to provide compression for him.   Rehab Potential Good   Clinical Impairments Affecting Rehab Potential previous radiation   PT Frequency 2x / week   PT Duration 4 weeks   PT Treatment/Interventions ADLs/Self Care Home Management;Therapeutic exercise;Therapeutic activities;Gait training;DME Instruction;Patient/family education;Manual techniques;Manual lymph drainage;Scar mobilization;Passive range of motion;Taping   PT Next Visit Plan Check goals.  Look at patient's neck garment from his work to see if it might provide the compression he needs; consider cutting a 1/2 inch gray foam piece to use with this garment to add more compression where needed.  Instruct patient's wife in manual lymph drainage.   Consulted and Agree with Plan of Care Patient;Family member/caregiver      Patient will benefit from skilled therapeutic intervention in order to improve the following deficits and impairments:  Decreased range of motion, Decreased endurance, Increased edema, Postural dysfunction  Visit Diagnosis: Lymphedema, not elsewhere classified     Problem List Patient Active Problem List   Diagnosis Date Noted  . Carcinoma of tonsillar fossa ([place]) 07/19/2015  . Cancer of tonsil, palatine ([place]) 07/12/2015  . Abnormal EKG 04/22/2014  . Essential hypertension 04/22/2014  . Hyperlipidemia 04/22/2014    Dartanyon Frankowski 12/26/2015, 2:49 PM  [place], [place], [address] Phone: [phone]   Fax:  [phone]  Name: JULEN TU MRN: [id number] Date of Birth: [birth year]  Serafina Royals, PT 12/26/15 2:49 PM  [place], [place], [address] Phone: [phone]   Fax:  [phone]  Physical Therapy Treatment  Patient Details  Name: Randy Hanson MRN: [id number] Date of Birth: 02/11/1941 Referring Provider: Eppie Gibson, MD  Encounter Date: 12/26/2015      PT End of Session - 12/26/15 1442    Visit Number 3   Number of Visits 9   Date for PT Re-Evaluation 01/19/16   PT Start Time [id number]   PT Stop Time 1434   PT Time Calculation (min) 42 min   Activity Tolerance Patient tolerated treatment well   Behavior During Therapy [facility] for tasks assessed/performed      Past Medical History:  Diagnosis Date  . Back pain   . BPH (benign prostatic hyperplasia)   . Cancer of tonsil, palatine ([place]) 07/12/2015  . DJD (degenerative joint disease)   . Esophageal reflux   . FHx: colonic polyps   . Hemorrhoids   . Hypercholesteremia   . Hyperlipemia   . Hypertension   . Kidney cysts    [facility] [place] [facility]-2012  . Posterior vitreous detachment, left eye    DR. KATHERINE HECKER  AUGUST 2011  . Radiation 08/28/15- 10/16/15   Right Tonsil and Bilateral Neck    Past Surgical History:  Procedure Laterality Date  . CARDIAC CATHETERIZATION     remote >15 years ago  . IR GENERIC HISTORICAL  10/11/2015   IR PATIENT EVAL TECH 0-60 MINS 10/11/2015 Darrell K Allred, PA-C [facility]  . VASECTOMY  1975    There were no vitals filed for this visit.      Subjective Assessment - 12/26/15 1355    Subjective              [facility] PT Assessment - 12/26/15 0001      Assessment   Medical Diagnosis                       [facility] Adult PT Treatment/Exercise - 12/26/15 0001      Self-Care   Self-Care Other Self-Care Comments   Other Self-Care Comments  Showed patient and his wife samples of [employer] and [employer] swell spot; showed pictures of head and neck compression garments from notebook and went over  some of the pros and cons of each.  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      Manual Therapy   Manual Lymphatic Drainage (MLD) In recline on elevated mat: supraclavicular fossae, bilateral axillae, bilateral shoulder collectors; posterolateral, lateral, anteriolateral, and anterior neck; chin; cheek; then reinforcing pathways down to axillae.                        [facility] Goals - 12/19/15 1015      CC Long Term Goal  #1   Title Decrease circumference measurement at 8 cm proximal to sternal notch to 49.5 cm or less.   Time 4   Period Weeks   Status New     CC Long Term Goal  #2   Title Patient will be knowledgeable about lymphedema risk reduction practices.   Time 4   Period Weeks   Status New     CC Long Term Goal  #3   Title Patient will

## 2015-12-27 ENCOUNTER — Telehealth: Payer: Self-pay | Admitting: Hematology & Oncology

## 2015-12-27 NOTE — Telephone Encounter (Signed)
Faxed Request/Order for PET Scan to [facility] at Fax # [phone]. Their Phn # is [phone].     [facility].

## 2015-12-28 ENCOUNTER — Ambulatory Visit: Payer: Medicare Other | Admitting: Physical Therapy

## 2015-12-28 ENCOUNTER — Encounter: Payer: Self-pay | Admitting: Physical Therapy

## 2015-12-28 DIAGNOSIS — R29898 Other symptoms and signs involving the musculoskeletal system: Secondary | ICD-10-CM | POA: Diagnosis not present

## 2015-12-28 DIAGNOSIS — R293 Abnormal posture: Secondary | ICD-10-CM | POA: Diagnosis not present

## 2015-12-28 DIAGNOSIS — R1319 Other dysphagia: Secondary | ICD-10-CM | POA: Diagnosis not present

## 2015-12-28 DIAGNOSIS — I890 Lymphedema, not elsewhere classified: Secondary | ICD-10-CM

## 2015-12-28 NOTE — Therapy (Signed)
Baseline 12/28/15- 54.5   Time 4   Period Weeks   Status On-going     CC Long Term Goal  #2   Title Patient will be knowledgeable about lymphedema risk reduction practices.   Baseline 12/28/15- issued handout to patient - he was unable to recall practices   Time 4   Period Weeks   Status On-going     CC Long Term Goal  #3   Title Patient will be knowledgeable about compression garments and how to obtain them.   Time 4   Period Weeks   Status On-going     CC Long Term Goal  #4   Title Patient and/or patient's wife will be independent in self manual lymph drainage.   Baseline 12/28/15- instructing patient's wife in technique today   Time 4   Period Weeks   Status On-going         Head and Neck Clinic Goals - 09/05/15 1149      Patient will be able to verbalize understanding of a home exercise program for cervical range of motion, posture, and  walking.    Status Achieved     Patient will be able to verbalize understanding of proper sitting and standing posture.    Status Achieved     Patient will be able to verbalize understanding of lymphedema risk and availability of treatment for this condition.    Status Achieved           Plan - 12/28/15 1651    Clinical Impression Statement Patient demonstrated softening of anterior neck edema following MLD. 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 Will assess if this provides enough compression when he receives it. Instructed pt's wife today on MLD and issued a handout. Had pt's wife return demonstrate correct technique.    Rehab Potential Good   Clinical Impairments Affecting Rehab Potential previous radiation   PT Frequency 2x / week   PT Duration 4 weeks   PT Treatment/Interventions ADLs/Self Care Home Management;Therapeutic exercise;Therapeutic activities;Gait training;DME Instruction;Patient/family education;Manual techniques;Manual lymph drainage;Scar mobilization;Passive range of motion;Taping   PT Next Visit Plan Look at patient's neck garment from his work to see if it might provide the compression he needs; consider cutting a 1/2 inch gray foam piece to use with this garment to add more compression where needed.  Assess if pt's wife is indep in MLD for pt   PT Home Exercise Plan cervical extension AROM, chin retractions   Consulted and Agree with Plan of Care Patient;Family member/caregiver      Patient will benefit from skilled therapeutic intervention in order to improve the following deficits and impairments:  Decreased range of motion, Decreased endurance, Increased edema, Postural dysfunction  Visit Diagnosis: Lymphedema, not elsewhere classified     Problem List Patient Active Problem List   Diagnosis Date Noted  . Carcinoma of tonsillar fossa ([place]) 07/19/2015  . Cancer of tonsil, palatine ([place])  07/12/2015  . Abnormal EKG 04/22/2014  . Essential hypertension 04/22/2014  . Hyperlipidemia 04/22/2014    Randy Hanson 12/28/2015, 4:53 PM  [place], [place], [address] Phone: [phone]   Fax:  [phone]  Name: CORDARRO SPEIER MRN: [id number] Date of Birth: 1940/05/29  Allyson Sabal, PT 12/28/15 4:53 PM  [place], [place], [address] Phone: [phone]   Fax:  [phone]  Physical Therapy Treatment  Patient Details  Name: JERRIS BAERWALD MRN: [id number] Date of Birth: 04/04/1940 Referring Provider: Eppie Gibson, MD  Encounter Date: 12/28/2015      PT End of Session - 12/28/15 1650    Visit Number 4   Number of Visits 9   Date for PT Re-Evaluation 01/19/16   PT Start Time 1601   PT Stop Time 1647   PT Time Calculation (min) 46 min   Activity Tolerance Patient tolerated treatment well   Behavior During Therapy [facility] for tasks assessed/performed      Past Medical History:  Diagnosis Date  . Back pain   . BPH (benign prostatic hyperplasia)   . Cancer of tonsil, palatine ([place]) 07/12/2015  . DJD (degenerative joint disease)   . Esophageal reflux   . FHx: colonic polyps   . Hemorrhoids   . Hypercholesteremia   . Hyperlipemia   . Hypertension   . Kidney cysts    [facility] [place] [facility]-2012  . Posterior vitreous detachment, left eye    DR. KATHERINE HECKER  AUGUST 2011  . Radiation 08/28/15- 10/16/15   Right Tonsil and Bilateral Neck    Past Surgical History:  Procedure Laterality Date  . CARDIAC CATHETERIZATION     remote >15 years ago  . IR GENERIC HISTORICAL  10/11/2015   IR PATIENT EVAL TECH 0-60 MINS 10/11/2015 Darrell K Allred, PA-C [facility]  . VASECTOMY  1975    There were no vitals filed for this visit.      Subjective Assessment - 12/28/15 1603    Subjective I had to order the garment from work. It should be here tomorrow. The chip bag works pretty good.    Pertinent History Diagnosed with right tonsil squamous cell carcinoma, P16 positive. Completed chemotherapy and radiation on 10/16/15.   Patient Stated Goals to get swelling at front of neck out   Currently in Pain? No/denies   Pain Score 0-No pain               LYMPHEDEMA/ONCOLOGY QUESTIONNAIRE -  12/28/15 1609      Head and Neck   4 cm superior to sternal notch around neck 48 cm   6 cm superior to sternal notch around neck 51.5 cm   8 cm superior to sternal notch around neck 54.5 cm                  [facility] Adult PT Treatment/Exercise - 12/28/15 0001      Self-Care   Self-Care Other Self-Care Comments   Other Self-Care Comments  --  instructed pt's wife in MLD for patient and issued handout -     Manual Therapy   Manual Lymphatic Drainage (MLD) In supine with head elevated on pillows: supraclavicular fossae, bilateral axillae, bilateral shoulder collectors; posterolateral, lateral, anteriolateral, and anterior neck; chin; cheek; then reinforcing pathways down to axillae.- instructed pt's wife throughout and issued pt's wife a handout to follow along with, had pt's wife return demonstrate correct technique                        Long Term Clinic Goals - 12/28/15 1604      CC Long Term Goal  #1   Title Decrease circumference measurement at 8 cm proximal to sternal notch to 49.5 cm or less.

## 2016-01-01 ENCOUNTER — Encounter: Payer: Self-pay | Admitting: Physical Therapy

## 2016-01-01 ENCOUNTER — Ambulatory Visit: Payer: Medicare Other | Admitting: Physical Therapy

## 2016-01-01 DIAGNOSIS — R29898 Other symptoms and signs involving the musculoskeletal system: Secondary | ICD-10-CM | POA: Diagnosis not present

## 2016-01-01 DIAGNOSIS — R293 Abnormal posture: Secondary | ICD-10-CM | POA: Diagnosis not present

## 2016-01-01 DIAGNOSIS — R1319 Other dysphagia: Secondary | ICD-10-CM | POA: Diagnosis not present

## 2016-01-01 DIAGNOSIS — I890 Lymphedema, not elsewhere classified: Secondary | ICD-10-CM | POA: Diagnosis not present

## 2016-01-01 NOTE — Therapy (Signed)
[facility] [address] [place], [place], [address] Phone: [phone]   Fax:  [phone]  Physical Therapy Treatment  Patient Details  Name: Randy Hanson MRN: [id number] Date of Birth: 03-17-1940 Referring Provider: Lonie Peak, MD  Encounter Date: 01/01/2016      PT End of Session - 01/01/16 1526    Visit Number 5   Number of Visits 9   Date for PT Re-Evaluation 01/19/16   PT Start Time 1430   PT Stop Time 1515   PT Time Calculation (min) 45 min   Activity Tolerance Patient tolerated treatment well   Behavior During Therapy [facility] for tasks assessed/performed      Past Medical History:  Diagnosis Date  . Back pain   . BPH (benign prostatic hyperplasia)   . 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 DJD (degenerative joint disease)   . Esophageal reflux   . FHx: colonic polyps   . Hemorrhoids   . Hypercholesteremia   . Hyperlipemia   . Hypertension   . Kidney cysts    [facility] [place] [facility]-2012  . Posterior vitreous detachment, left eye    DR. KATHERINE HECKER  AUGUST 2011  . Radiation 08/28/15- 10/16/15   Right Tonsil and Bilateral Neck    Past Surgical History:  Procedure Laterality Date  . CARDIAC CATHETERIZATION     remote >15 years ago  . IR GENERIC HISTORICAL  10/11/2015   IR PATIENT EVAL TECH 0-60 MINS 10/11/2015 Darrell K Allred, PA-C [facility]  . VASECTOMY  1975    There were no vitals filed for this visit.      Subjective Assessment - 01/01/16 1432    Subjective Patient states he was been rubbing down his neck. 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 He reports he is more swollen than he was this morning. Patient's wife states she has not attempted the self drainage yet.    Patient is accompained by: Family member   Pertinent History Diagnosed with right tonsil  squamous cell carcinoma, P16 positive. Completed chemotherapy and radiation on 10/16/15.   Patient Stated Goals to get swelling at front of neck out   Currently in Pain? No/denies   Pain Score 0-No pain                         [facility] Adult PT Treatment/Exercise - 01/01/16 0001      Manual Therapy   Manual Lymphatic Drainage (MLD) In supine with head elevated on pillows: 5 diaphragmatic breaths, supraclavicular fossae, bilateral axillae, bilateral shoulder collectors; posterolateral, lateral, anteriolateral, and anterior neck; chin; cheek; then reinforcing pathways down to axillae.- instructed pt's wife throughout                         Long Term Clinic Goals - 12/28/15 1604      CC Long Term Goal  #1   Title Decrease circumference measurement at 8 cm proximal to sternal notch to 49.5 cm or less.   Baseline 12/28/15- 54.5   Time 4   Period Weeks   Status On-going     CC Long Term Goal  #2   Title Patient will be knowledgeable about lymphedema risk reduction practices.   Baseline 12/28/15- issued handout to  patient - he was unable to recall practices   Time 4   Period Weeks   Status On-going     CC Long Term Goal  #3   Title Patient will be knowledgeable about compression garments and how to obtain them.   Time 4   Period Weeks   Status On-going     CC Long Term Goal  #4   Title Patient and/or patient's wife will be independent in self manual lymph drainage.   Baseline 12/28/15- instructing patient's wife in technique today   Time 4   Period Weeks   Status On-going         Head and Neck Clinic Goals - 09/05/15 1149      Patient will be able to verbalize understanding of a home exercise program for cervical range of motion, posture, and walking.    Status Achieved     Patient will be able to verbalize understanding of proper sitting and standing posture.    Status Achieved     Patient will be able to verbalize understanding  of lymphedema risk and availability of treatment for this condition.    Status Achieved           Plan - 01/01/16 1526    Clinical Impression Statement Patient's wife has not yet attempted MLD on patient at home. Performed MLD to anterior neck today while reinstructing patient's wife on correct technique. Pt demonstrated some softening of edema folllowing MLD. He will bring in paint sock as a possible compression garment next visit to assess.    Rehab Potential Good   Clinical Impairments Affecting Rehab Potential previous radiation   PT Frequency 2x / week   PT Duration 4 weeks   PT Treatment/Interventions ADLs/Self Care Home Management;Therapeutic exercise;Therapeutic activities;Gait training;DME Instruction;Patient/family education;Manual techniques;Manual lymph drainage;Scar mobilization;Passive range of motion;Taping   PT Next Visit Plan Look at patient's neck garment from his work to see if it might provide the compression he needs; consider cutting a 1/2 inch gray foam piece to use with this garment to add more compression where needed.  Assess if pt's wife is indep in MLD for pt   PT Home Exercise Plan cervical extension AROM, chin retractions   Consulted and Agree with Plan of Care Patient      Patient will benefit from skilled therapeutic intervention in order to improve the following deficits and impairments:  Decreased range of motion, Decreased endurance, Increased edema, Postural dysfunction  Visit Diagnosis: Lymphedema, not elsewhere classified     Problem List Patient Active Problem List   Diagnosis Date Noted  . Carcinoma of tonsillar fossa ([facility]) 07/19/2015  . 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 Abnormal EKG 04/22/2014  . Essential hypertension 04/22/2014  . Hyperlipidemia 04/22/2014    Redge Gainer 01/01/2016, 3:28 PM  [facility] [address] [place], [place], [address] Phone:  [phone]   Fax:  [phone]  Name: Randy Hanson MRN: [id number] Date of Birth: 24-Nov-1940   Milagros Loll, PT 01/01/16 3:28 PM

## 2016-01-05 ENCOUNTER — Ambulatory Visit: Payer: Medicare Other | Admitting: Physical Therapy

## 2016-01-05 DIAGNOSIS — R293 Abnormal posture: Secondary | ICD-10-CM | POA: Diagnosis not present

## 2016-01-05 DIAGNOSIS — I890 Lymphedema, not elsewhere classified: Secondary | ICD-10-CM | POA: Diagnosis not present

## 2016-01-05 DIAGNOSIS — R1319 Other dysphagia: Secondary | ICD-10-CM | POA: Diagnosis not present

## 2016-01-05 DIAGNOSIS — R29898 Other symptoms and signs involving the musculoskeletal system: Secondary | ICD-10-CM | POA: Diagnosis not present

## 2016-01-05 NOTE — Therapy (Signed)
[place], [place], [address] Phone: [phone]   Fax:  [phone]  Physical Therapy Treatment  Patient Details  Name: Randy Hanson MRN: [id number] Date of Birth: 1940/07/07 Referring Provider: Eppie Gibson, MD  Encounter Date: 01/05/2016      PT End of Session - 01/05/16 1156    Visit Number 6   Number of Visits 9   Date for PT Re-Evaluation 01/19/16   PT Start Time 1100   PT Stop Time 1151   PT Time Calculation (min) 51 min   Activity Tolerance Patient tolerated treatment well   Behavior During Therapy [facility] for tasks assessed/performed      Past Medical History:  Diagnosis Date  . Back pain   . BPH (benign prostatic hyperplasia)   . Cancer of tonsil, palatine ([place]) 07/12/2015  . DJD (degenerative joint disease)   . Esophageal reflux   . FHx: colonic polyps   . Hemorrhoids   . Hypercholesteremia   . Hyperlipemia   . Hypertension   . Kidney cysts    [facility] [place] [facility]-2012  . Posterior vitreous detachment, left eye    DR. KATHERINE HECKER  AUGUST 2011  . Radiation 08/28/15- 10/16/15   Right Tonsil and Bilateral Neck    Past Surgical History:  Procedure Laterality Date  . CARDIAC CATHETERIZATION     remote >15 years ago  . IR GENERIC HISTORICAL  10/11/2015   IR PATIENT EVAL TECH 0-60 MINS 10/11/2015 Darrell K Allred, PA-C [facility]  . VASECTOMY  1975    There were no vitals filed for this visit.      Subjective Assessment - 01/05/16 1104    Subjective "I found one of those socks (for compression).  It's old but it would work.  I'm ordering a new one." Wife has done manual lymph drainage and doesn't feel fully comfortable with it yet, but keeps the handout right there.      Currently in Pain? 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  Also instructed him that he can cut it down further if needed. Also, reveiwed lymphedema risk reduction information.     Manual Therapy   Manual Lymphatic Drainage (MLD) In supine with head elevated on pillows: 5 diaphragmatic breaths, supraclavicular fossae, bilateral axillae, bilateral shoulder collectors; posterolateral, lateral, anteriolateral, and anterior neck; chin; cheek; then reinforcing pathways down to axillae.- instructed pt's wife throughout  Essential hypertension 04/22/2014  . Hyperlipidemia 04/22/2014    [name],[name] 01/05/2016, 12:01 PM  [place], [place], [address] Phone: [phone]   Fax:  [phone]  Name: CUTTER PASSEY MRN: [id number] Date of Birth: 10-25-1940   Serafina Royals, PT 01/05/16 12:01 PM  [place], [place], [address] Phone: [phone]   Fax:  [phone]  Physical Therapy Treatment  Patient Details  Name: Randy Hanson MRN: [id number] Date of Birth: 1940/07/07 Referring Provider: Eppie Gibson, MD  Encounter Date: 01/05/2016      PT End of Session - 01/05/16 1156    Visit Number 6   Number of Visits 9   Date for PT Re-Evaluation 01/19/16   PT Start Time 1100   PT Stop Time 1151   PT Time Calculation (min) 51 min   Activity Tolerance Patient tolerated treatment well   Behavior During Therapy [facility] for tasks assessed/performed      Past Medical History:  Diagnosis Date  . Back pain   . BPH (benign prostatic hyperplasia)   . Cancer of tonsil, palatine ([place]) 07/12/2015  . DJD (degenerative joint disease)   . Esophageal reflux   . FHx: colonic polyps   . Hemorrhoids   . Hypercholesteremia   . Hyperlipemia   . Hypertension   . Kidney cysts    [facility] [place] [facility]-2012  . Posterior vitreous detachment, left eye    DR. KATHERINE HECKER  AUGUST 2011  . Radiation 08/28/15- 10/16/15   Right Tonsil and Bilateral Neck    Past Surgical History:  Procedure Laterality Date  . CARDIAC CATHETERIZATION     remote >15 years ago  . IR GENERIC HISTORICAL  10/11/2015   IR PATIENT EVAL TECH 0-60 MINS 10/11/2015 Darrell K Allred, PA-C [facility]  . VASECTOMY  1975    There were no vitals filed for this visit.      Subjective Assessment - 01/05/16 1104    Subjective "I found one of those socks (for compression).  It's old but it would work.  I'm ordering a new one." Wife has done manual lymph drainage and doesn't feel fully comfortable with it yet, but keeps the handout right there.      Currently in Pain? 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  Also instructed him that he can cut it down further if needed. Also, reveiwed lymphedema risk reduction information.     Manual Therapy   Manual Lymphatic Drainage (MLD) In supine with head elevated on pillows: 5 diaphragmatic breaths, supraclavicular fossae, bilateral axillae, bilateral shoulder collectors; posterolateral, lateral, anteriolateral, and anterior neck; chin; cheek; then reinforcing pathways down to axillae.- instructed pt's wife throughout

## 2016-01-08 ENCOUNTER — Encounter: Payer: Self-pay | Admitting: Physical Therapy

## 2016-01-08 ENCOUNTER — Ambulatory Visit: Payer: Medicare Other | Admitting: Physical Therapy

## 2016-01-08 DIAGNOSIS — I890 Lymphedema, not elsewhere classified: Secondary | ICD-10-CM | POA: Diagnosis not present

## 2016-01-08 DIAGNOSIS — R1319 Other dysphagia: Secondary | ICD-10-CM | POA: Diagnosis not present

## 2016-01-08 DIAGNOSIS — R29898 Other symptoms and signs involving the musculoskeletal system: Secondary | ICD-10-CM | POA: Diagnosis not present

## 2016-01-08 DIAGNOSIS — R293 Abnormal posture: Secondary | ICD-10-CM | POA: Diagnosis not present

## 2016-01-08 NOTE — Therapy (Signed)
be independent in self manual lymph drainage.   Status Partially Met           Plan - 01/08/16 1510    Clinical Impression Statement Patient has not received paint sock yet to try to use for compression. He is hoping to have it tomorrow. Patient's wife is feeling more comfortable with drainage technique. Pt demonstrated softening of edema at end of session. He was encouraged to use his chip pack when he get homes.    Rehab Potential Good   Clinical Impairments Affecting Rehab Potential previous radiation   PT Frequency 2x / week   PT Duration 4 weeks   PT Treatment/Interventions ADLs/Self Care Home Management;Therapeutic exercise;Therapeutic activities;Gait training;DME Instruction;Patient/family education;Manual techniques;Manual lymph drainage;Scar mobilization;Passive range of motion;Taping   PT Next Visit Plan Look at patient's neck garment from his work to see if it might provide the  compression he needs.  Assess if pt's wife is indep in MLD for pt.  Continue manual lymph drainage and work on getting home compression garment situated.  Re cert this visit   PT Home Exercise Plan cervical extension AROM, chin retractions   Consulted and Agree with Plan of Care Patient;Family member/caregiver      Patient will benefit from skilled therapeutic intervention in order to improve the following deficits and impairments:  Decreased range of motion, Decreased endurance, Increased edema, Postural dysfunction  Visit Diagnosis: Lymphedema, not elsewhere classified     Problem List Patient Active Problem List   Diagnosis Date Noted  . Carcinoma of tonsillar fossa ([place]) 07/19/2015  . 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 Abnormal EKG 04/22/2014  . Essential hypertension 04/22/2014  . Hyperlipidemia 04/22/2014    [name] 01/08/2016, 3:13 PM  [place], [place], [address] Phone: [phone]   Fax:  [phone]  Name: Randy Hanson MRN: [id number] Date of Birth: [birth year]  Allyson Sabal, PT 01/08/16 3:13 PM  [place], [place], [address] Phone: [phone]   Fax:  [phone]  Physical Therapy Treatment  Patient Details  Name: Randy Hanson MRN: [id number] Date of Birth: August 17, 1940 Referring Provider: Eppie Gibson, MD  Encounter Date: 01/08/2016      PT End of Session - 01/08/16 1510    Visit Number 7   Number of Visits 9   Date for PT Re-Evaluation 01/19/16   PT Start Time 6629   PT Stop Time 1509   PT Time Calculation (min) 46 min   Activity Tolerance Patient tolerated treatment well   Behavior During Therapy [facility] for tasks assessed/performed      Past Medical History:  Diagnosis Date  . Back pain   . BPH (benign prostatic hyperplasia)   . 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 DJD (degenerative joint disease)   . Esophageal reflux   . FHx: colonic polyps   . Hemorrhoids   . Hypercholesteremia   . Hyperlipemia   . Hypertension   . Kidney cysts    [facility] [place] [facility]2012  . Posterior vitreous detachment, left eye    DR. KATHERINE HECKER  AUGUST 2011  . Radiation 08/28/15- 10/16/15   Right Tonsil and Bilateral Neck    Past Surgical History:  Procedure Laterality Date  . CARDIAC CATHETERIZATION     remote >15 years ago  . IR GENERIC HISTORICAL  10/11/2015   IR PATIENT EVAL TECH 0-60 MINS 10/11/2015 Darrell K Allred, PA-C [facility]  . VASECTOMY  1975    There were no vitals filed for this visit.      Subjective Assessment - 01/08/16 1425    Subjective I haven't gotten a new painter sock but the grey foam didn't work in the one that I have. I should get a new sock tomorrow. I have been wearing the foam chip bag a lot and it helps.    Patient is accompained by: Family member   Pertinent History Diagnosed with right tonsil squamous cell carcinoma, P16 positive. Completed chemotherapy and radiation on 10/16/15.   Patient Stated Goals to get swelling at front of neck  out   Currently in Pain? No/denies   Pain Score 0-No pain                         [facility] Adult PT Treatment/Exercise - 01/08/16 0001      Manual Therapy   Manual Lymphatic Drainage (MLD) In supine with head elevated on pillows: 5 diaphragmatic breaths, supraclavicular fossae, bilateral axillae, bilateral shoulder collectors; posterolateral, lateral, anteriolateral, and anterior neck; chin; cheek; then reinforcing pathways down to axillae.                        Long Term Clinic Goals - 01/05/16 1115      CC Long Term Goal  #1   Title Decrease circumference measurement at 8 cm proximal to sternal notch to 49.5 cm or less.   Status On-going     CC Long Term Goal  #2   Title Patient will be knowledgeable about lymphedema risk reduction practices.   Status Achieved     CC Long Term Goal  #3   Title Patient will be knowledgeable about compression garments and how to obtain them.   Status On-going     CC Long Term Goal  #4   Title Patient and/or patient's wife will

## 2016-01-10 ENCOUNTER — Ambulatory Visit: Payer: Medicare Other | Attending: Radiation Oncology | Admitting: Physical Therapy

## 2016-01-10 ENCOUNTER — Encounter: Payer: Self-pay | Admitting: Physical Therapy

## 2016-01-10 DIAGNOSIS — R1313 Dysphagia, pharyngeal phase: Secondary | ICD-10-CM | POA: Insufficient documentation

## 2016-01-10 DIAGNOSIS — R293 Abnormal posture: Secondary | ICD-10-CM | POA: Insufficient documentation

## 2016-01-10 DIAGNOSIS — I890 Lymphedema, not elsewhere classified: Secondary | ICD-10-CM

## 2016-01-10 DIAGNOSIS — R29898 Other symptoms and signs involving the musculoskeletal system: Secondary | ICD-10-CM | POA: Insufficient documentation

## 2016-01-10 NOTE — Therapy (Signed)
[place], [place], [address] Phone: [phone]   Fax:  [phone]  Physical Therapy Treatment  Patient Details  Name: Randy Hanson MRN: [id number] Date of Birth: 05-16-1940 Referring Provider: Eppie Gibson, MD  Encounter Date: 01/10/2016      PT End of Session - 01/10/16 1159    Visit Number 8   Number of Visits 9   Date for PT Re-Evaluation 01/19/16   PT Start Time 1056   PT Stop Time 1145   PT Time Calculation (min) 49 min   Activity Tolerance Patient tolerated treatment well   Behavior During Therapy [facility] for tasks assessed/performed      Past Medical History:  Diagnosis Date  . Back pain   . BPH (benign prostatic hyperplasia)   . Cancer of tonsil, palatine ([place]) 07/12/2015  . DJD (degenerative joint disease)   . Esophageal reflux   . FHx: colonic polyps   . Hemorrhoids   . Hypercholesteremia   . Hyperlipemia   . Hypertension   . Kidney cysts    [facility] [place] [facility]-2012  . Posterior vitreous detachment, left eye    DR. KATHERINE HECKER  AUGUST 2011  . Radiation 08/28/15- 10/16/15   Right Tonsil and Bilateral Neck    Past Surgical History:  Procedure Laterality Date  . CARDIAC CATHETERIZATION     remote >15 years ago  . IR GENERIC HISTORICAL  10/11/2015   IR PATIENT EVAL TECH 0-60 MINS 10/11/2015 Darrell K Allred, PA-C [facility]  . VASECTOMY  1975    There were no vitals filed for this visit.      Subjective Assessment - 01/10/16 1058    Subjective I still haven't heard about the paint sock. I sat for the greater part of the day yesterday with the chip bag on and my wife did the drainage technique and it helped. The swelling was soft.    Patient is accompained by: Family member   Pertinent History Diagnosed with right tonsil squamous cell carcinoma, P16 positive. Completed chemotherapy and radiation on 10/16/15.   Patient Stated Goals to get swelling at front  of neck out   Currently in Pain? No/denies   Pain Score 0-No pain               LYMPHEDEMA/ONCOLOGY QUESTIONNAIRE - 01/10/16 1105      Head and Neck   4 cm superior to sternal notch around neck (P)  47 cm   6 cm superior to sternal notch around neck (P)  51.2 cm   8 cm superior to sternal notch around neck (P)  54 cm                  [facility] Adult PT Treatment/Exercise - 01/10/16 0001      Manual Therapy   Manual Lymphatic Drainage (MLD) In supine with head elevated on pillows: supraclavicular fossae, bilateral axillae, bilateral shoulder collectors; posterolateral, lateral, anteriolateral, and anterior neck; chin; cheek; then reinforcing pathways down to axillae.                        Long Term Clinic Goals - 01/10/16 1158      CC Long Term Goal  #1   Title Decrease circumference measurement at 8 cm proximal to sternal notch to 49.5 cm or less.   Baseline 12/28/15- 54.5, 01/10/16- 54 cm   Time 4   Period Weeks   Status On-going  CC Long Term Goal  #2   Title Patient will be knowledgeable about lymphedema risk reduction practices.   Baseline 12/28/15- issued handout to patient - he was unable to recall practices   Time 4   Period Weeks   Status On-going     CC Long Term Goal  #3   Title Patient will be knowledgeable about compression garments and how to obtain them.   Time 4   Period Weeks   Status On-going     CC Long Term Goal  #4   Title Patient and/or patient's wife will be independent in self manual lymph drainage.   Baseline 12/28/15- instructing patient's wife in technique today, 01/10/16- patient's wife feels independent with MLD technique   Time 4   Period Weeks   Status Achieved         Head and Neck Clinic Goals - 09/05/15 1149      Patient will be able to verbalize understanding of a home exercise program for cervical range of motion, posture, and walking.    Status Achieved     Patient will be able to  verbalize understanding of proper sitting and standing posture.    Status Achieved     Patient will be able to verbalize understanding of lymphedema risk and availability of treatment for this condition.    Status Achieved           Plan - 01/10/16 1200    Clinical Impression Statement Patient has not received paint sock yet. Patient will benefit from assist to obtain compression garment from local medical supply store. Patient demonstrated softening of fibrotic edema following manual lymphatic drianage today. He states his chip bag also works for softening fibrosis at home. His wife has been performing drainage technique at home for pt.    Rehab Potential Good   Clinical Impairments Affecting Rehab Potential previous radiation   PT Frequency 2x / week   PT Duration 4 weeks   PT Treatment/Interventions ADLs/Self Care Home Management;Therapeutic exercise;Therapeutic activities;Gait training;DME Instruction;Patient/family education;Manual techniques;Manual lymph drainage;Scar mobilization;Passive range of motion;Taping   PT Next Visit Plan Re cert POC, assist pt with obtaining head and neck garment locally,  Continue manual lymph drainage and work on getting home compression garment situated.  Re cert this visit   PT Home Exercise Plan cervical extension AROM, chin retractions   Consulted and Agree with Plan of Care Patient      Patient will benefit from skilled therapeutic intervention in order to improve the following deficits and impairments:  Decreased range of motion, Decreased endurance, Increased edema, Postural dysfunction  Visit Diagnosis: Lymphedema, not elsewhere classified     Problem List Patient Active Problem List   Diagnosis Date Noted  . Carcinoma of tonsillar fossa ([place]) 07/19/2015  . 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 Abnormal EKG 04/22/2014  . Essential hypertension 04/22/2014  . Hyperlipidemia 04/22/2014    Randy Hanson 01/10/2016, 12:06 PM  [place], [place], [address] Phone: [phone]   Fax:  [phone]  Name: Randy Hanson MRN: [id number] Date of Birth: 08/16/1940  Allyson Sabal, PT 01/10/16 12:07 PM

## 2016-01-12 ENCOUNTER — Telehealth: Payer: Self-pay | Admitting: *Deleted

## 2016-01-12 NOTE — Telephone Encounter (Signed)
Called patient to inform of Pet Scan for 02-08-16 - arrival time - 9:30 am, pt. To be NPO - 6 hrs. Prior to test and his fu on 02-09-16 @ 10:20 am to get his results from Dr. Isidore Moos, spoke with patient and he is aware of these appts.

## 2016-01-15 ENCOUNTER — Ambulatory Visit: Payer: Medicare Other

## 2016-01-15 DIAGNOSIS — I890 Lymphedema, not elsewhere classified: Secondary | ICD-10-CM | POA: Diagnosis not present

## 2016-01-15 DIAGNOSIS — R1313 Dysphagia, pharyngeal phase: Secondary | ICD-10-CM

## 2016-01-15 DIAGNOSIS — R293 Abnormal posture: Secondary | ICD-10-CM | POA: Diagnosis not present

## 2016-01-15 DIAGNOSIS — R29898 Other symptoms and signs involving the musculoskeletal system: Secondary | ICD-10-CM | POA: Diagnosis not present

## 2016-01-15 NOTE — Patient Instructions (Signed)
Please do exercises with the goal being 1/2 the number of repetitions I originally prescribed for you, TWO to [place]. You told me you have been completing 4-5 repetitions 3-4 times a day, with only three to four exercises.  In time, you should be able to complete the full number of repetitions prescribed.  I think your difficulty may have to do with weakened swallowing musculature from not using it very often in the last few months. I have suggested a swallow test to Dr. Isidore Moos so we can find out more of what is going on in your throat.

## 2016-01-15 NOTE — Therapy (Signed)
[place] [address] [place], [place], [address] Phone: [phone]   Fax:  [phone]  Speech Language Pathology Treatment  Patient Details  Name: Randy Hanson MRN: [id number] Date of Birth: 05-30-1940 Referring Provider: Eppie Gibson MD  Encounter Date: 01/15/2016      End of Session - 01/15/16 1540    Visit Number 4   Number of Visits 6   Date for SLP Re-Evaluation 03/29/16   SLP Start Time 39   SLP Stop Time  1510   SLP Time Calculation (min) 40 min   Activity Tolerance Patient tolerated treatment well      Past Medical History:  Diagnosis Date  . Back pain   . BPH (benign prostatic hyperplasia)   . Cancer of tonsil, palatine ([place]) 07/12/2015  . DJD (degenerative joint disease)   . Esophageal reflux   . FHx: colonic polyps   . Hemorrhoids   . Hypercholesteremia   . Hyperlipemia   . Hypertension   . Kidney cysts    [facility] [place] [facility]2012  . Posterior vitreous detachment, left eye    DR. KATHERINE HECKER  AUGUST 2011  . Radiation 08/28/15- 10/16/15   Right Tonsil and Bilateral Neck    Past Surgical History:  Procedure Laterality Date  . CARDIAC CATHETERIZATION     remote >15 years ago  . IR GENERIC HISTORICAL  10/11/2015   IR PATIENT EVAL TECH 0-60 MINS 10/11/2015 Darrell K Allred, PA-C [facility]  . VASECTOMY  1975    There were no vitals filed for this visit.      Subjective Assessment - 01/15/16 1440    Subjective Pt has been receiving PT for lyphedema at [facility]. "You can't relax cause you're always hacking and spitting."   Currently in Pain? No/denies               ADULT SLP TREATMENT - 01/15/16 1441      General Information   Behavior/Cognition Alert;Cooperative;Pleasant mood     Treatment Provided   Treatment provided Dysphagia     Dysphagia Treatment   Temperature Spikes Noted No   Respiratory Status Room air   Treatment  Methods Skilled observation;Therapeutic exercise   Patient observed directly with PO's Yes   Type of PO's observed Thin liquids   Oral Phase Signs & Symptoms --  none suspected   Pharyngeal Phase Signs & Symptoms Immediate throat clear   Other treatment/comments "Thank goodness for water, I carry a bottle around everywhere I go." Pt reports HEP has been making his throat sore (?). He cont to report significant thick white phlegm, and thick yellow phlegm in last 2-3 days. Pt took six 1/2-3/4 teaspoons of vegetable soup broth. First 3 boluses were wihtout overt s/s difficulty swallowing, however consistent immediate throat clearing was heard with the last three. Pt "hocked" x3 and what appeared to be broth was regurgitated each time. This SLP's opinion is that pt is with pharyngeal clearance issues. Recommend modified barium swallow exam (MBS) to properly diagnose swallowing difficulty and focus pt's HEP on those areas needed most, as ID'd by MBS. SLP needed to provide mod cues occasionally for pt with HEP.     Assessment / Recommendations / Plan   Plan Continue with current plan of care;Consult other service (comment)  modified barium swallow (MBS)     Progression Toward Goals   Progression toward goals Not progressing toward goals (comment)  pt educated on rationale  [place] [address] [place], [place], [address] Phone: [phone]   Fax:  [phone]  Speech Language Pathology Treatment  Patient Details  Name: Randy Hanson MRN: [id number] Date of Birth: 05-30-1940 Referring Provider: Eppie Gibson MD  Encounter Date: 01/15/2016      End of Session - 01/15/16 1540    Visit Number 4   Number of Visits 6   Date for SLP Re-Evaluation 03/29/16   SLP Start Time 39   SLP Stop Time  1510   SLP Time Calculation (min) 40 min   Activity Tolerance Patient tolerated treatment well      Past Medical History:  Diagnosis Date  . Back pain   . BPH (benign prostatic hyperplasia)   . Cancer of tonsil, palatine ([place]) 07/12/2015  . DJD (degenerative joint disease)   . Esophageal reflux   . FHx: colonic polyps   . Hemorrhoids   . Hypercholesteremia   . Hyperlipemia   . Hypertension   . Kidney cysts    [facility] [place] [facility]-2012  . Posterior vitreous detachment, left eye    DR. KATHERINE HECKER  AUGUST 2011  . Radiation 08/28/15- 10/16/15   Right Tonsil and Bilateral Neck    Past Surgical History:  Procedure Laterality Date  . CARDIAC CATHETERIZATION     remote >15 years ago  . IR GENERIC HISTORICAL  10/11/2015   IR PATIENT EVAL TECH 0-60 MINS 10/11/2015 Darrell K Allred, PA-C [facility]  . VASECTOMY  1975    There were no vitals filed for this visit.      Subjective Assessment - 01/15/16 1440    Subjective Pt has been receiving PT for lyphedema at [facility]. "You can't relax cause you're always hacking and spitting."   Currently in Pain? No/denies               ADULT SLP TREATMENT - 01/15/16 1441      General Information   Behavior/Cognition Alert;Cooperative;Pleasant mood     Treatment Provided   Treatment provided Dysphagia     Dysphagia Treatment   Temperature Spikes Noted No   Respiratory Status Room air   Treatment  Methods Skilled observation;Therapeutic exercise   Patient observed directly with PO's Yes   Type of PO's observed Thin liquids   Oral Phase Signs & Symptoms --  none suspected   Pharyngeal Phase Signs & Symptoms Immediate throat clear   Other treatment/comments "Thank goodness for water, I carry a bottle around everywhere I go." Pt reports HEP has been making his throat sore (?). He cont to report significant thick white phlegm, and thick yellow phlegm in last 2-3 days. Pt took six 1/2-3/4 teaspoons of vegetable soup broth. First 3 boluses were wihtout overt s/s difficulty swallowing, however consistent immediate throat clearing was heard with the last three. Pt "hocked" x3 and what appeared to be broth was regurgitated each time. This SLP's opinion is that pt is with pharyngeal clearance issues. Recommend modified barium swallow exam (MBS) to properly diagnose swallowing difficulty and focus pt's HEP on those areas needed most, as ID'd by MBS. SLP needed to provide mod cues occasionally for pt with HEP.     Assessment / Recommendations / Plan   Plan Continue with current plan of care;Consult other service (comment)  modified barium swallow (MBS)     Progression Toward Goals   Progression toward goals Not progressing toward goals (comment)  pt educated on rationale  01/15/2016, 3:58 PM  [place] [address] [place], [place], [address] Phone: [phone]   Fax:  [phone]   Name: Randy Hanson MRN: [id number] Date of Birth: 26-Jul-1940

## 2016-01-16 ENCOUNTER — Other Ambulatory Visit: Payer: Self-pay | Admitting: Radiation Oncology

## 2016-01-16 ENCOUNTER — Ambulatory Visit: Payer: Medicare Other | Admitting: Physical Therapy

## 2016-01-16 DIAGNOSIS — R29898 Other symptoms and signs involving the musculoskeletal system: Secondary | ICD-10-CM | POA: Diagnosis not present

## 2016-01-16 DIAGNOSIS — I890 Lymphedema, not elsewhere classified: Secondary | ICD-10-CM

## 2016-01-16 DIAGNOSIS — C090 Malignant neoplasm of tonsillar fossa: Secondary | ICD-10-CM

## 2016-01-16 DIAGNOSIS — R293 Abnormal posture: Secondary | ICD-10-CM

## 2016-01-16 DIAGNOSIS — R1313 Dysphagia, pharyngeal phase: Secondary | ICD-10-CM | POA: Diagnosis not present

## 2016-01-16 NOTE — Therapy (Signed)
[place], [place], [address] Phone: [phone]   Fax:  [phone]  Physical Therapy Treatment  Patient Details  Name: Randy Hanson MRN: [id number] Date of Birth: 1941/01/04 Referring Provider: Eppie Gibson, MD  Encounter Date: 01/16/2016      PT End of Session - 01/16/16 1720    Visit Number 9   Number of Visits 13   Date for PT Re-Evaluation 02/08/16   PT Start Time 1430   PT Stop Time 1516   PT Time Calculation (min) 46 min   Activity Tolerance Patient tolerated treatment well   Behavior During Therapy [facility] for tasks assessed/performed      Past Medical History:  Diagnosis Date  . Back pain   . BPH (benign prostatic hyperplasia)   . Cancer of tonsil, palatine ([place]) 07/12/2015  . DJD (degenerative joint disease)   . Esophageal reflux   . FHx: colonic polyps   . Hemorrhoids   . Hypercholesteremia   . Hyperlipemia   . Hypertension   . Kidney cysts    [facility] [place] [facility]-2012  . Posterior vitreous detachment, left eye    DR. KATHERINE HECKER  AUGUST 2011  . Radiation 08/28/15- 10/16/15   Right Tonsil and Bilateral Neck    Past Surgical History:  Procedure Laterality Date  . CARDIAC CATHETERIZATION     remote >15 years ago  . IR GENERIC HISTORICAL  10/11/2015   IR PATIENT EVAL TECH 0-60 MINS 10/11/2015 Darrell K Allred, PA-C [facility]  . VASECTOMY  1975    There were no vitals filed for this visit.      Subjective Assessment - 01/16/16 1430    Subjective Feeling a little under the weather--might be getting a cold.  Bought a woman's headband for exercising, and feels it works well for compression on his neck.  Got this a couple days ago and has had it on about 20 hours since then.  Wife hasn't been able to do manual lymph drainage since last Thursday because of complications from cataract surgery.   Currently in Pain? No/denies                LYMPHEDEMA/ONCOLOGY QUESTIONNAIRE - 01/16/16 1436      Head and Neck   4 cm superior to sternal notch around neck 46.3 cm   6 cm superior to sternal notch around neck 48.4 cm   8 cm superior to sternal notch around neck 50.8 cm                  [facility] Adult PT Treatment/Exercise - 01/16/16 0001      Self-Care   Other Self-Care Comments  Checked patient's new garment (headband for athletic activities) that he bought and has been wearing;it does seem to provide compression where he needs it most.  Discussed trying gray foam piece in it too.     Exercises   Exercises Neck     Neck Exercises: Supine   Neck Retraction 10 reps  5 AA, 5 with isometric push back in therapist's hand     Manual Therapy   Manual Therapy Edema management;Passive ROM   Edema Management circumference measurements taken   Manual Lymphatic Drainage (MLD) In supine with head elevated on pillows: supraclavicular fossae, bilateral axillae, bilateral shoulder collectors; bilat. chest and upper flank; posterolateral, lateral, anteriolateral, and anterior neck; chin; cheek; then reinforcing pathways down to axillae.   Passive ROM supine pect minor stretches; neck  [place], [place], [address] Phone: [phone]   Fax:  [phone]  Name: Randy Hanson MRN: [id number] Date of Birth: 1940-05-29   Serafina Royals, PT 01/16/16 5:30 PM  [place], [place], [address] Phone: [phone]   Fax:  [phone]  Name: Randy Hanson MRN: [id number] Date of Birth: 1940-05-29   Serafina Royals, PT 01/16/16 5:30 PM

## 2016-01-23 ENCOUNTER — Telehealth: Payer: Self-pay | Admitting: *Deleted

## 2016-01-23 NOTE — Telephone Encounter (Signed)
Oncology Nurse Navigator Documentation  Returned call to Mrs. Breau in follow-up to her call yesterday.   I addressed her concerns re Daiwik's swallowing difficulty, follow-up with ENT.  She knows SLP Garald Balding has recommended MBS per Gildo's 11/6 appt with him, that I will follow-up on scheduling of MBS in conjunction with 11/30 restaging PET.  I explained follow-up with ENT usually follows restaging PET per Dr. Pearlie Oyster guidance. She understands I will call her with MBS appt when scheduled.  Gayleen Orem, RN, BSN, [place] at [place] [phone]

## 2016-01-23 NOTE — Telephone Encounter (Addendum)
Oncology Nurse Navigator Documentation  Spoke with [facility], scheduled MBS for 11/20 1400 with 1345 arrival to [facility]. Called Mrs. Rapaport, provided appt information, she voiced understanding of appt, absence of oral intake restrictions for procedure.  Gayleen Orem, RN, BSN, [place] at [place] [phone]

## 2016-01-24 ENCOUNTER — Other Ambulatory Visit (HOSPITAL_COMMUNITY): Payer: Self-pay | Admitting: Radiation Oncology

## 2016-01-24 ENCOUNTER — Encounter: Payer: No Typology Code available for payment source | Admitting: Physical Therapy

## 2016-01-24 ENCOUNTER — Ambulatory Visit: Payer: Medicare Other | Admitting: Physical Therapy

## 2016-01-24 DIAGNOSIS — R1313 Dysphagia, pharyngeal phase: Secondary | ICD-10-CM | POA: Diagnosis not present

## 2016-01-24 DIAGNOSIS — I890 Lymphedema, not elsewhere classified: Secondary | ICD-10-CM | POA: Diagnosis not present

## 2016-01-24 DIAGNOSIS — R29898 Other symptoms and signs involving the musculoskeletal system: Secondary | ICD-10-CM

## 2016-01-24 DIAGNOSIS — R293 Abnormal posture: Secondary | ICD-10-CM

## 2016-01-24 DIAGNOSIS — R1319 Other dysphagia: Secondary | ICD-10-CM

## 2016-01-24 NOTE — Therapy (Signed)
[place], [place], [address] Phone: [phone]   Fax:  [phone]  Physical Therapy Treatment  Patient Details  Name: Randy Hanson MRN: [id number] Date of Birth: 1940-11-21 Referring Provider: Eppie Gibson, MD  Encounter Date: 01/24/2016      PT End of Session - 01/24/16 1722    Visit Number 10   Number of Visits 13   Date for PT Re-Evaluation 02/08/16   PT Start Time 9628   PT Stop Time 1430   PT Time Calculation (min) 41 min   Activity Tolerance Patient tolerated treatment well   Behavior During Therapy [facility] for tasks assessed/performed      Past Medical History:  Diagnosis Date  . Back pain   . BPH (benign prostatic hyperplasia)   . Cancer of tonsil, palatine ([place]) 07/12/2015  . DJD (degenerative joint disease)   . Esophageal reflux   . FHx: colonic polyps   . Hemorrhoids   . Hypercholesteremia   . Hyperlipemia   . Hypertension   . Kidney cysts    [facility] [place] [facility]  . Posterior vitreous detachment, left eye    DR. KATHERINE Hanson  AUGUST 2011  . Radiation 08/28/15- 10/16/15   Right Tonsil and Bilateral Neck    Past Surgical History:  Procedure Laterality Date  . CARDIAC CATHETERIZATION     remote >15 years ago  . IR GENERIC HISTORICAL  10/11/2015   IR PATIENT EVAL TECH 0-60 MINS 10/11/2015 Randy K Allred, PA-C [facility]  . VASECTOMY  1975    There were no vitals filed for this visit.      Subjective Assessment - 01/24/16 1352    Subjective Feels like his ears are stuffed up now.  Still wearing the headband and feels like it's tight enough without adding foam to it.  Wife has been doing the massage.  Got a little sore after the last session, especially at shoulders.   Currently in Pain? No/denies                         [facility] Adult PT Treatment/Exercise - 01/24/16 0001      Neck Exercises: Supine   Cervical Isometrics  Extension;10 reps;3 secs  against therapist's hands at back of head     Manual Therapy   Manual Therapy Myofascial release   Myofascial Release O-A release in supine   Manual Lymphatic Drainage (MLD) In supine with head elevated on pillows: supraclavicular fossae, bilateral axillae, bilateral shoulder collectors; bilat. chest and upper flank; posterolateral, lateral, anteriolateral, and anterior neck; chin; cheek; then reinforcing pathways down to axillae.   Passive ROM neck sidebend, rotation, and retraction                        Long Term Clinic Goals - 01/24/16 1724      CC Long Term Goal  #1   Title Decrease circumference measurement at 8 cm proximal to sternal notch to 49.5 cm or less.   Status Partially Met     CC Long Term Goal  #2   Title Patient will be knowledgeable about lymphedema risk reduction practices.   Status Achieved     CC Long Term Goal  #3   Title Patient will be knowledgeable about compression garments and how to obtain them.   Status Achieved     CC Long Term Goal  #4   Title  Patient and/or patient's wife will be independent in self manual lymph drainage.   Status Achieved         Head and Neck Clinic Goals - 09/05/15 1149      Patient will be able to verbalize understanding of a home exercise program for cervical range of motion, posture, and walking.    Status Achieved     Patient will be able to verbalize understanding of proper sitting and standing posture.    Status Achieved     Patient will be able to verbalize understanding of lymphedema risk and availability of treatment for this condition.    Status Achieved           Plan - 01/24/16 1722    Clinical Impression Statement Pt. came in with anterior neck feeling very firm, but we achieved some softening during session today.  He reported soreness from pect stretches at last session, so these were not done today.  He tolerated other stretches to neck  well. Patient's wife accompanied him again today and reports doing manual lymph drainage at home.   Rehab Potential Good   Clinical Impairments Affecting Rehab Potential previous radiation   PT Frequency 2x / week   PT Duration 4 weeks   PT Treatment/Interventions ADLs/Self Care Home Management;Therapeutic exercise;Therapeutic activities;Gait training;DME Instruction;Patient/family education;Manual techniques;Manual lymph drainage;Scar mobilization;Passive range of motion;Taping   PT Next Visit Plan Remeasure circumferences and check goals.  Continue manual lymph drainage and manual techniques (P/ROM) for neck.   PT Home Exercise Plan cervical extension AROM, chin retractions   Consulted and Agree with Plan of Care Patient      Patient will benefit from skilled therapeutic intervention in order to improve the following deficits and impairments:  Decreased range of motion, Decreased endurance, Increased edema, Postural dysfunction  Visit Diagnosis: Lymphedema, not elsewhere classified  Abnormal posture  Other symptoms and signs involving the musculoskeletal system     Problem List Patient Active Problem List   Diagnosis Date Noted  . Carcinoma of tonsillar fossa ([place]) 07/19/2015  . Cancer of tonsil, palatine ([place]) 07/12/2015  . Abnormal EKG 04/22/2014  . Essential hypertension 04/22/2014  . Hyperlipidemia 04/22/2014    Hanson,Randy 01/24/2016, 5:26 PM  [place], [place], [address] Phone: [phone]   Fax:  [phone]  Name: Randy Hanson MRN: [id number] Date of Birth: 1940-06-14   Randy Hanson, PT 01/24/16 5:26 PM

## 2016-01-26 ENCOUNTER — Encounter: Payer: No Typology Code available for payment source | Admitting: Physical Therapy

## 2016-01-29 ENCOUNTER — Encounter: Payer: No Typology Code available for payment source | Admitting: Physical Therapy

## 2016-01-29 ENCOUNTER — Ambulatory Visit (HOSPITAL_COMMUNITY)
Admission: RE | Admit: 2016-01-29 | Discharge: 2016-01-29 | Disposition: A | Discharge status: Home / Self Care | Payer: Medicare Other | Source: Ambulatory Visit | Attending: Radiation Oncology | Admitting: Radiation Oncology

## 2016-01-29 DIAGNOSIS — C090 Malignant neoplasm of tonsillar fossa: Secondary | ICD-10-CM

## 2016-01-29 DIAGNOSIS — R05 Cough: Secondary | ICD-10-CM | POA: Diagnosis not present

## 2016-01-29 DIAGNOSIS — R1310 Dysphagia, unspecified: Secondary | ICD-10-CM | POA: Diagnosis not present

## 2016-01-29 DIAGNOSIS — R1319 Other dysphagia: Secondary | ICD-10-CM

## 2016-01-30 ENCOUNTER — Ambulatory Visit (HOSPITAL_BASED_OUTPATIENT_CLINIC_OR_DEPARTMENT_OTHER): Payer: Non-veteran care | Admitting: Hematology & Oncology

## 2016-01-30 ENCOUNTER — Encounter: Payer: Self-pay | Admitting: Hematology & Oncology

## 2016-01-30 ENCOUNTER — Ambulatory Visit (HOSPITAL_BASED_OUTPATIENT_CLINIC_OR_DEPARTMENT_OTHER): Payer: Medicare Other

## 2016-01-30 ENCOUNTER — Other Ambulatory Visit (HOSPITAL_BASED_OUTPATIENT_CLINIC_OR_DEPARTMENT_OTHER): Payer: Non-veteran care

## 2016-01-30 VITALS — BP 122/65 | HR 87 | Temp 98.4°F | Resp 16

## 2016-01-30 DIAGNOSIS — C099 Malignant neoplasm of tonsil, unspecified: Secondary | ICD-10-CM | POA: Diagnosis not present

## 2016-01-30 DIAGNOSIS — E032 Hypothyroidism due to medicaments and other exogenous substances: Secondary | ICD-10-CM

## 2016-01-30 DIAGNOSIS — C090 Malignant neoplasm of tonsillar fossa: Secondary | ICD-10-CM

## 2016-01-30 LAB — CBC WITH DIFFERENTIAL (CANCER CENTER ONLY)
BASO#: 0 10*3/uL (ref 0.0–0.2)
BASO%: 0.5 % (ref 0.0–2.0)
EOS ABS: 0.1 10*3/uL (ref 0.0–0.5)
EOS%: 2.2 % (ref 0.0–7.0)
HCT: 39.6 % (ref 38.7–49.9)
HGB: 13.8 g/dL (ref 13.0–17.1)
LYMPH#: 0.6 10*3/uL — ABNORMAL LOW (ref 0.9–3.3)
LYMPH%: 11.5 % — AB (ref 14.0–48.0)
MCH: 33.7 pg — AB (ref 28.0–33.4)
MCHC: 34.8 g/dL (ref 32.0–35.9)
MCV: 97 fL (ref 82–98)
MONO#: 0.6 10*3/uL (ref 0.1–0.9)
MONO%: 11.4 % (ref 0.0–13.0)
NEUT#: 4.1 10*3/uL (ref 1.5–6.5)
NEUT%: 74.4 % (ref 40.0–80.0)
PLATELETS: 207 10*3/uL (ref 145–400)
RBC: 4.09 10*6/uL — AB (ref 4.20–5.70)
RDW: 11.3 % (ref 11.1–15.7)
WBC: 5.6 10*3/uL (ref 4.0–10.0)

## 2016-01-30 LAB — COMPREHENSIVE METABOLIC PANEL
ALT: 15 U/L (ref 0–55)
ANION GAP: 9 meq/L (ref 3–11)
AST: 19 U/L (ref 5–34)
Albumin: 3.7 g/dL (ref 3.5–5.0)
Alkaline Phosphatase: 78 U/L (ref 40–150)
BILIRUBIN TOTAL: 0.29 mg/dL (ref 0.20–1.20)
BUN: 19.6 mg/dL (ref 7.0–26.0)
CO2: 26 meq/L (ref 22–29)
Calcium: 10.2 mg/dL (ref 8.4–10.4)
Chloride: 104 mEq/L (ref 98–109)
Creatinine: 0.9 mg/dL (ref 0.7–1.3)
EGFR: 83 mL/min/{1.73_m2} — AB (ref >90)
Glucose: 101 mg/dl (ref 70–140)
Potassium: 4.4 mEq/L (ref 3.5–5.1)
Sodium: 140 mEq/L (ref 136–145)
TOTAL PROTEIN: 7 g/dL (ref 6.4–8.3)

## 2016-01-30 MED ORDER — HEPARIN SOD (PORK) LOCK FLUSH 100 UNIT/ML IV SOLN
500.0000 [IU] | Freq: Once | INTRAVENOUS | Status: AC
  Administered 2016-01-30: 500 [IU] via INTRAVENOUS
  Filled 2016-01-30: qty 5

## 2016-01-30 MED ORDER — SODIUM CHLORIDE 0.9% FLUSH
10.0000 mL | INTRAVENOUS | Status: DC | PRN
  Administered 2016-01-30: 10 mL via INTRAVENOUS
  Filled 2016-01-30: qty 10

## 2016-01-30 NOTE — Patient Instructions (Signed)

## 2016-01-30 NOTE — Progress Notes (Signed)
Hematology and Oncology Follow Up Visit  Randy Hanson [id number] [birth year] 75 y.o. 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 He is having a lot of difficulty swallowing. He saw speech pathology yesterday area and he is not able to swallow yet. It seems like food seems to get stuck down in the mid esophagus. I'm sure that this is from secretions.  He does have the feeding tube in. He uses it about 4 times a day. He has not lost weight.  I'm not sure if radiation oncology will have him do any type of  physical therapy for his swallowing. It does not sound like he aspirates.  He also has some decreased hearing. He has was had poor hearing out of the left ear. He has some decreased hearing out of the right ear. This happened several days ago. His wife recently has had a cold.  He does not want to use urinates. [facility]. He will see his primary care doctor there. I'm sure that she will be able to refer him for audiology evaluation.  He does not hurt. He has no cough. He has no issues going to the bathroom.  He apparently is set up for a PET scan on November 30.   Overall, his performance status is ECOG 1.     Medications:    Medication List       Accurate as of 01/30/16 12:42 PM. Always use your most recent med list.          aspirin 81 MG tablet Take 81 mg by mouth daily.   docusate sodium 100 MG capsule Commonly known as:  COLACE Take 100 mg by mouth 2 (two) times daily.   feeding supplement (OSMOLITE 1.5 CAL) Liqd Begin Osmolite 1.5 or equivalent via PEG, 1 can QID with 120 cc free water before and after bolus. Increase by one can every 2 days until goal of 7 cans achieved. Give 30 mL promod or equivalent BID  mixed with 6 oz water.  Flush with 60 cc free water after Promod.   hydrochlorothiazide 25 MG tablet Commonly known as:  HYDRODIURIL Take 25 mg by mouth daily.   HYDROcodone-acetaminophen 5-325 MG tablet Commonly known as:  NORCO/VICODIN   lidocaine 2 % solution Commonly known as:  XYLOCAINE Patient: Mix 1part 2% viscous lidocaine, 1part H20. Swish and/or swallow 10mL of this mixture, before meals and at bedtime, up to QID   lidocaine-prilocaine cream Commonly known as:  EMLA Apply to affected area as needed   LORazepam 0.5 MG tablet Commonly known as:  ATIVAN Take 1 tablet (0.5 mg total) by mouth every 6 (six) hours as needed (Nausea or vomiting).   losartan 50 MG tablet Commonly known as:  COZAAR Take 50 mg by mouth daily.   pilocarpine 5 MG tablet Commonly known as:  SALAGEN Take 1 tablet (5 mg total) by mouth 2 (two) times daily.   prochlorperazine 10 MG tablet Commonly known as:  COMPAZINE Take 1 tablet (10 mg total) by mouth every 6 (six) hours as needed (Nausea or vomiting).   ranitidine 150 MG capsule Commonly known as:  ZANTAC Take 150 mg by mouth 2 (two) times daily.  SONAFINE Apply 1 application topically 3 (three) times daily.   terazosin 2 MG capsule Commonly known as:  HYTRIN Take 2 mg by mouth at bedtime.       Allergies:  Allergies  Allergen Reactions  . Lisinopril Cough    Past Medical History, Surgical history, Social history, and Family History were reviewed and updated.  Review of Systems: All other 10 point review of systems is negative.   Physical Exam:  oral temperature is 98.4 F (36.9 C). His blood pressure is 122/65 and his pulse is 87. His respiration is 16.   Wt Readings from Last 3 Encounters:  12/19/15 229 lb 3.2 oz (104 kg)  12/08/15 226 lb (102.5 kg)  11/17/15 223 lb (101.2 kg)    Head and neck exam shows no ocular lesions. He does have minimal erythema and mucositis in the soft palate. His oral mucosa is  somewhat dry. He has erythema on his neck. There is no radiation dermatitis breakdown on his neck.No obvious adenopathy is noted on the neck. Thyroid is nonpalpable. Lungs are clear. Cardiac exam regular rate and rhythm with no murmurs, rubs or bruits. Abdomen is soft. His feeding tube is in the upper left quadrant. This is intact. He has decent bowel sounds. He has some slight tenderness at the G-tube site. There is no palpable liver or spleen tip. Back exam shows no tenderness over the spine, ribs or hips. Extremities shows no clubbing, cyanosis or edema. Skin exam is slightly dry. Neurological exam shows no focal neurological deficits.   Lab Results  Component Value Date   WBC 5.6 01/30/2016   HGB 13.8 01/30/2016   HCT 39.6 01/30/2016   MCV 97 01/30/2016   PLT 207 01/30/2016   No results found for: FERRITIN, IRON, TIBC, UIBC, IRONPCTSAT Lab Results  Component Value Date   RBC 4.09 (L) 01/30/2016   No results found for: KPAFRELGTCHN, LAMBDASER, KAPLAMBRATIO No results found for: IGGSERUM, IGA, IGMSERUM No results found for: Marda Stalker, SPEI   Chemistry      Component Value Date/Time   NA 140 12/08/2015 1014   K 4.1 12/08/2015 1014   CL 98 10/18/2015 1023   CO2 28 12/08/2015 1014   BUN 22.9 12/08/2015 1014   CREATININE 0.9 12/08/2015 1014      Component Value Date/Time   CALCIUM 10.0 12/08/2015 1014   ALKPHOS 69 12/08/2015 1014   AST 20 12/08/2015 1014   ALT 18 12/08/2015 1014   BILITOT 0.37 12/08/2015 1014     Impression and Plan: Randy Hanson is a very pleasant 75 yo white male with stage I-HPV+ - squamous cell carcinoma the right tonsil. He completed radiation and chemotherapy in early August.   I feel bad that he is having these issues with swallowing. I told him that this should resolve in about 4- 5 months. He is only 3 months out from completion of his treatments. I would like to believe that he will improve MBL  wouldn't swallow again. Some of this difficulty with swallowing might be mental. It is hard to say.  I know that he has been through quite a bit. I understand quite well that it will be a process with him being able to swallow regular food again.  He is on Salagen. I told him to make sure he takes his Salagen.  I told him to rinse his mouth out with regular Listerine. This might help break up some of the thick mucus that  he has.  I would like to see him back in another 6-8 weeks.   [name], MD 11/21/201712:42 PM

## 2016-01-31 ENCOUNTER — Ambulatory Visit: Payer: Medicare Other | Admitting: Physical Therapy

## 2016-01-31 DIAGNOSIS — R293 Abnormal posture: Secondary | ICD-10-CM

## 2016-01-31 DIAGNOSIS — R29898 Other symptoms and signs involving the musculoskeletal system: Secondary | ICD-10-CM

## 2016-01-31 DIAGNOSIS — R1313 Dysphagia, pharyngeal phase: Secondary | ICD-10-CM | POA: Diagnosis not present

## 2016-01-31 DIAGNOSIS — I890 Lymphedema, not elsewhere classified: Secondary | ICD-10-CM

## 2016-01-31 NOTE — Therapy (Signed)
of Birth: [birth year]  Randy Hanson, PT 01/31/16 12:55 PM  of Birth: [birth year]  Randy Hanson, PT 01/31/16 12:55 PM  [place], [place], [address] Phone: [phone]   Fax:  [phone]  Physical Therapy Treatment  Patient Details  Name: Randy Hanson MRN: [id number] Date of Birth: Jan 05, 1941 Referring Provider: Eppie Gibson, MD  Encounter Date: 01/31/2016      PT End of Session - 01/31/16 1249    Visit Number 11   Number of Visits 13   Date for PT Re-Evaluation 02/08/16   PT Start Time 1020   PT Stop Time 1106   PT Time Calculation (min) 46 min   Activity Tolerance Patient tolerated treatment well   Behavior During Therapy [facility] for tasks assessed/performed      Past Medical History:  Diagnosis Date  . Back pain   . BPH (benign prostatic hyperplasia)   . Cancer of tonsil, palatine ([place]) 07/12/2015  . DJD (degenerative joint disease)   . Esophageal reflux   . FHx: colonic polyps   . Hemorrhoids   . Hypercholesteremia   . Hyperlipemia   . Hypertension   . Kidney cysts    [facility] [place] [facility]  . Posterior vitreous detachment, left eye    DR. KATHERINE HECKER  AUGUST 2011  . Radiation 08/28/15- 10/16/15   Right Tonsil and Bilateral Neck    Past Surgical History:  Procedure Laterality Date  . CARDIAC CATHETERIZATION     remote >15 years ago  . IR GENERIC HISTORICAL  10/11/2015   IR PATIENT EVAL TECH 0-60 MINS 10/11/2015 Darrell K Allred, PA-C [facility]  . VASECTOMY  1975    There were no vitals filed for this visit.      Subjective Assessment - 01/31/16 1022    Subjective "I did have my throat x-rayed on Monday."  It was a modified barium swallow.  I can swallow liquids; I can't follow any solid foods               LYMPHEDEMA/ONCOLOGY QUESTIONNAIRE - 01/31/16 1103      Head and Neck   8 cm superior to sternal notch around neck 48.6 cm  after manual lymph drainage                  [facility] Adult PT Treatment/Exercise - 01/31/16 0001      Self-Care   Other  Self-Care Comments  Discussed Flexitouch pump and gave patient brochure about this; he would like to check into in     Neck Exercises: Supine   Cervical Isometrics Extension;3 secs;5 reps  against therapist's hands at back of head     Manual Therapy   Edema Management circumference measurement taken   Myofascial Release O-A release in supine   Manual Lymphatic Drainage (MLD) In supine with head elevated on pillows: supraclavicular fossae, bilateral axillae, bilateral shoulder collectors; bilat. chest and upper flank; posterolateral, lateral, anteriolateral, and anterior neck; chin; cheek; then reinforcing pathways down to axillae.   Passive ROM neck sidebend, rotation, and retraction                PT Education - 01/31/16 1248    Education provided Yes   Education Details about availability of Flexitouch lymphedema pump; to do isometric neck retraction/extension exercise at home   Person(s) Educated Patient;Spouse   Methods Explanation;Handout   Comprehension Verbalized understanding                [facility] Goals - 01/31/16 1251      CC Long Term Goal  #

## 2016-02-05 ENCOUNTER — Ambulatory Visit: Payer: Medicare Other | Admitting: Physical Therapy

## 2016-02-05 DIAGNOSIS — I890 Lymphedema, not elsewhere classified: Secondary | ICD-10-CM

## 2016-02-05 DIAGNOSIS — R293 Abnormal posture: Secondary | ICD-10-CM

## 2016-02-05 DIAGNOSIS — R29898 Other symptoms and signs involving the musculoskeletal system: Secondary | ICD-10-CM | POA: Diagnosis not present

## 2016-02-05 DIAGNOSIS — R1313 Dysphagia, pharyngeal phase: Secondary | ICD-10-CM | POA: Diagnosis not present

## 2016-02-05 NOTE — Therapy (Signed)
[facility] [address] [place], [place], [address] Phone: [phone]   Fax:  [phone]  Physical Therapy Treatment  Patient Details  Name: Randy Hanson MRN: [id number] Date of Birth: [birth year] Referring Provider: Lonie Peak, MD  Encounter Date: 02/05/2016      PT End of Session - 02/05/16 1227    Visit Number 12   Number of Visits 13   Date for PT Re-Evaluation 02/08/16   PT Start Time 1104   PT Stop Time 1145   PT Time Calculation (min) 41 min   Activity Tolerance Patient tolerated treatment well   Behavior During Therapy [facility] for tasks assessed/performed      Past Medical History:  Diagnosis Date  . Back pain   . BPH (benign prostatic hyperplasia)   . 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 DJD (degenerative joint disease)   . Esophageal reflux   . FHx: colonic polyps   . Hemorrhoids   . Hypercholesteremia   . Hyperlipemia   . Hypertension   . Kidney cysts    [facility] [place] [facility]-2012  . Posterior vitreous detachment, left eye    DR. KATHERINE HECKER  AUGUST 2011  . Radiation 08/28/15- 10/16/15   Right Tonsil and Bilateral Neck    Past Surgical History:  Procedure Laterality Date  . CARDIAC CATHETERIZATION     remote >15 years ago  . IR GENERIC HISTORICAL  10/11/2015   IR PATIENT EVAL TECH 0-60 MINS 10/11/2015 Darrell K Allred, PA-C [facility]  . VASECTOMY  1975    There were no vitals filed for this visit.      Subjective Assessment - 02/05/16 1108    Subjective "He got a call this morning about the machine.  [facility].  He's coming Monday to the house."  (Wife explaining about the Flexitouch rep calling.) Wearing the band around the neck reduces the neck--wore it about all day yesterday and that made it smaller.  Hasn't tried wearing it to bed.     Currently in Pain? Yes   Pain Score 0-No pain  a liitle sore if you mash on it   Pain Location Neck   Pain  Orientation Right;Upper;Lateral   Pain Descriptors / Indicators Sore   Aggravating Factors  pressing on it   Pain Relieving Factors no pressure on it                         [facility] Adult PT Treatment/Exercise - 02/05/16 0001      Neck Exercises: Supine   Cervical Isometrics Extension;3 secs;5 reps  against therapist's hands at back of head     Manual Therapy   Myofascial Release O-A release in supine   Manual Lymphatic Drainage (MLD) In supine with head elevated on pillows: supraclavicular fossae, bilateral axillae, bilateral shoulder collectors; bilat. chest and upper flank; posterolateral, lateral, anteriolateral, and anterior neck; chin; cheek; then reinforcing pathways down to axillae.   Passive ROM neck sidebend, rotation, and retraction; also right levator scapulae stretch                        Long Term Clinic Goals - 02/05/16 1232      CC Long Term Goal  #5   Title Patient and/or his wife will be independent in management of lymphedema with all tools they need, possibly to include a Flexitouch pump   Status On-going  Head and Neck Clinic Goals - 09/05/15 1149      Patient will be able to verbalize understanding of a home exercise program for cervical range of motion, posture, and walking.    Status Achieved     Patient will be able to verbalize understanding of proper sitting and standing posture.    Status Achieved     Patient will be able to verbalize understanding of lymphedema risk and availability of treatment for this condition.    Status Achieved           Plan - 02/05/16 1228    Clinical Impression Statement Patient reports good benefit from using his compression garment, which he wore most of the day yesterday.  He has not tried wearing it to sleep in, but says he will do that.  Still with fullness and induration at anterior neck that benefits from manual lymph drainage.  He got a call this morning  to set up a demo of the Flexitouch pump.     Rehab Potential Good   Clinical Impairments Affecting Rehab Potential previous radiation   PT Frequency 2x / week   PT Duration 4 weeks   PT Treatment/Interventions ADLs/Self Care Home Management;Therapeutic exercise;Therapeutic activities;Gait training;DME Instruction;Patient/family education;Manual techniques;Manual lymph drainage;Scar mobilization;Passive range of motion;Taping   PT Next Visit Plan Will need renewal next visit.  Plan to continue 2x/week x 2 weeks or more, possibly until patient gets his pump. Continue P/ROM, manual lymph drainage, and postural muscle strengthening.   PT Home Exercise Plan cervical extension AROM, chin retractions   Consulted and Agree with Plan of Care Patient      Patient will benefit from skilled therapeutic intervention in order to improve the following deficits and impairments:  Decreased range of motion, Decreased endurance, Increased edema, Postural dysfunction  Visit Diagnosis: Lymphedema, not elsewhere classified  Abnormal posture  Other symptoms and signs involving the musculoskeletal system     Problem List Patient Active Problem List   Diagnosis Date Noted  . Carcinoma of tonsillar fossa ([facility]) 07/19/2015  . 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 Abnormal EKG 04/22/2014  . Essential hypertension 04/22/2014  . Hyperlipidemia 04/22/2014    Keyry Iracheta 02/05/2016, 12:34 PM  [facility] [address] [place], [place], [address] Phone: [phone]   Fax:  [phone]  Name: Randy Hanson MRN: [id number] Date of Birth: 02/15/1941   Micheline Maze, PT 02/05/16 12:34 PM

## 2016-02-06 ENCOUNTER — Telehealth: Payer: Self-pay | Admitting: Nutrition

## 2016-02-06 ENCOUNTER — Ambulatory Visit: Payer: Medicare Other

## 2016-02-06 ENCOUNTER — Encounter: Payer: Self-pay | Admitting: Radiation Oncology

## 2016-02-06 DIAGNOSIS — R293 Abnormal posture: Secondary | ICD-10-CM

## 2016-02-06 DIAGNOSIS — R29898 Other symptoms and signs involving the musculoskeletal system: Secondary | ICD-10-CM | POA: Diagnosis not present

## 2016-02-06 DIAGNOSIS — I890 Lymphedema, not elsewhere classified: Secondary | ICD-10-CM

## 2016-02-06 DIAGNOSIS — R1313 Dysphagia, pharyngeal phase: Secondary | ICD-10-CM | POA: Diagnosis not present

## 2016-02-06 NOTE — Telephone Encounter (Signed)
Randy Hanson contacted me to request I write a new order to decrease Osmolite 1.5. Patient has decreased tube feeding on his own to 4-1/2 cans of Osmolite 1.5, because he felt like he was gaining too much weight. They would like the order changed to reflect current usage of tube feeding. Randy Hanson reports patient now weighs 226 pounds.   4 and 1/2 cans of Osmolite 1.5 provides about 1600 cal, which is only about 15 cal per kilogram.   Patient is coming in the end of this week and will be weighed at that time. Asked Randy Hanson to wait until patient is weighed at the [facility] before decreasing tube feeding. She agrees.

## 2016-02-06 NOTE — Therapy (Signed)
instructing patient's wife in technique today, 01/10/16- patient's wife feels independent with MLD technique   Status Achieved     CC Long Term Goal  #5   Title Patient and/or his wife will be independent in management of lymphedema with all tools they need, possibly to include a Flexitouch pump   Status On-going         [facility] Ne[facility] Clinic Goals - 09/05/15 1149      Patient will be able to verbalize understanding of a home exercise program for cervical range of motion, posture, and walking.    Status Achieved     Patient will be able to verbalize understanding of proper sitting and standing posture.    Status Achieved     Patient will be able to verbalize understanding of lymphedema risk and availability of treatment for this condition.    Status Achieved           Plan -  02/06/16 1159    Clinical Impression Statement Pt reports overall he feels like his neck tightness has improved since start of care and his swelling fluctuates but feels it reduces after manual lymph drainage and wearing his chip pack. He has his Flexitouch demonstration next week and pt will be renewed per last PT note for up to 4 more weeks until pt receives his Flexitouch and to continue to improve his reports of neck tightness and lymphedema.   Rehab Potential Good   Clinical Impairments Affecting Rehab Potential previous radiation   PT Frequency 2x / week   PT Duration 4 weeks   PT Treatment/Interventions ADLs/Self Care Home Management;Therapeutic exercise;Therapeutic activities;Gait training;DME Instruction;Patient/family education;Manual techniques;Manual lymph drainage;Scar mobilization;Passive range of motion;Taping   PT Next Visit Plan Renewal done this visit for up to 4 more weeks (though PT thought possibly only needed 2 more weeks) until pt gets his pump. Cont P/ROM, manual lymph drainage and postural muscle strengthening.    PT Home Exercise Plan cervical extension AROM, chin retractions   Consulted and Agree with Plan of Care Patient      Patient will benefit from skilled therapeutic intervention in order to improve the following deficits and impairments:  Decreased range of motion, Decreased endurance, Increased edema, Postural dysfunction  Visit Diagnosis: Lymphedema, not elsewhere classified  Abnormal posture  Other symptoms and signs involving the musculoskeletal system     Problem List Patient Active Problem List   Diagnosis Date Noted  . Carcinoma of tonsillar fossa ([place]) 07/19/2015  . 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 Abnormal EKG 04/22/2014  . Essential hypertension 04/22/2014  . Hyperlipidemia 04/22/2014    Otelia Limes, PTA 02/06/2016, 12:04 PM  [address] [place], [place], [address] Phone: [phone]   Fax:  [phone]  Name: Randy Hanson MRN: [id number] Date of Birth: [birth year]  [place], [place], [address] Phone: [phone]   Fax:  [phone]  Physical Therapy Treatment  Patient Details  Name: Randy Hanson MRN: [id number] Date of Birth: Jul 21, 1940 Referring Provider: Eppie Gibson, MD  Encounter Date: 02/06/2016      PT End of Session - 02/06/16 1159    Visit Number 13   Number of Visits 21   Date for PT Re-Evaluation 03/05/16   PT Start Time 1110   PT Stop Time 1151   PT Time Calculation (min) 41 min   Activity Tolerance Patient tolerated treatment well   Behavior During Therapy [facility] for tasks assessed/performed      Past Medical History:  Diagnosis Date  . Back pain   . BPH (benign prostatic hyperplasia)   . 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 DJD (degenerative joint disease)   . Esophageal reflux   . FHx: colonic polyps   . Hemorrhoids   . Hypercholesteremia   . Hyperlipemia   . Hypertension   . Kidney cysts    [facility] [place] [facility]-2012  . Posterior vitreous detachment, left eye    DR. KATHERINE HECKER  AUGUST 2011  . Radiation 08/28/15- 10/16/15   Right Tonsil and Bilateral Neck    Past Surgical History:  Procedure Laterality Date  . CARDIAC CATHETERIZATION     remote >15 years ago  . IR GENERIC HISTORICAL  10/11/2015   IR PATIENT EVAL TECH 0-60 MINS 10/11/2015 Darrell K Allred, PA-C [facility]-INTERV RAD  . VASECTOMY  1975    There were no vitals filed for this visit.      Subjective Assessment - 02/06/16 1112    Subjective No changes from yesterday.   Pertinent History Diagnosed with right tonsil squamous cell carcinoma, P16 positive. Completed chemotherapy and radiation on 10/16/15.   Patient Stated Goals to get swelling at front of neck out   Currently in Pain? No/denies                         [facility] Adult PT Treatment/Exercise - 02/06/16 0001      Manual Therapy   Manual Lymphatic Drainage (MLD) In supine  with head elevated on pillows: supraclavicular fossae, 5 diaphragmatic breaths, bilateral axillae, bilateral shoulder collectors; bilat. chest and upper flank; posterolateral, lateral, anteriolateral, and anterior neck and suboccipital nodes; chin; cheek; then reinforcing pathways down to axillae.   Passive ROM Bil neck sidebend, rotation, and retraction; also right levator scapulae stretch                        Long Term Clinic Goals - 02/06/16 1202      CC Long Term Goal  #1   Title Decrease circumference measurement at 8 cm proximal to sternal notch to 49.5 cm or less.   Baseline 12/28/15- 54.5, 01/10/16- 54 cm; 48.6 cm. AFTER manual lymph drainage on 01/31/16   Status Achieved     CC Long Term Goal  #2   Title Patient will be knowledgeable about lymphedema risk reduction practices.   Status Achieved     CC Long Term Goal  #3   Title Patient will be knowledgeable about compression garments and how to obtain them.   Status Achieved     CC Long Term Goal  #4   Title Patient and/or patient's wife will be independent in self manual lymph drainage.   Baseline 12/28/15-

## 2016-02-08 ENCOUNTER — Ambulatory Visit (HOSPITAL_COMMUNITY): Payer: Non-veteran care

## 2016-02-08 ENCOUNTER — Encounter (HOSPITAL_COMMUNITY)
Admission: RE | Admit: 2016-02-08 | Discharge: 2016-02-08 | Disposition: A | Discharge status: Home / Self Care | Payer: Non-veteran care | Source: Ambulatory Visit | Attending: Radiation Oncology | Admitting: Radiation Oncology

## 2016-02-08 DIAGNOSIS — C090 Malignant neoplasm of tonsillar fossa: Secondary | ICD-10-CM | POA: Insufficient documentation

## 2016-02-08 DIAGNOSIS — C099 Malignant neoplasm of tonsil, unspecified: Secondary | ICD-10-CM | POA: Diagnosis not present

## 2016-02-08 LAB — GLUCOSE, CAPILLARY: Glucose-Capillary: 122 mg/dL — ABNORMAL HIGH (ref 65–99)

## 2016-02-08 MED ORDER — FLUDEOXYGLUCOSE F - 18 (FDG) INJECTION
11.4300 | Freq: Once | INTRAVENOUS | Status: AC | PRN
  Administered 2016-02-08: 11.43 via INTRAVENOUS

## 2016-02-09 ENCOUNTER — Encounter: Payer: Self-pay | Admitting: Radiation Oncology

## 2016-02-09 ENCOUNTER — Ambulatory Visit
Admission: RE | Admit: 2016-02-09 | Discharge: 2016-02-09 | Disposition: A | Discharge status: Home / Self Care | Payer: Medicare Other | Source: Ambulatory Visit | Attending: Radiation Oncology | Admitting: Radiation Oncology

## 2016-02-09 VITALS — BP 131/87 | HR 102 | Temp 98.1°F | Resp 12 | Wt 226.6 lb

## 2016-02-09 DIAGNOSIS — C090 Malignant neoplasm of tonsillar fossa: Secondary | ICD-10-CM | POA: Diagnosis not present

## 2016-02-09 HISTORY — DX: Personal history of irradiation: Z92.3

## 2016-02-09 MED ORDER — LARYNGOSCOPY SOLUTION RAD-ONC
15.0000 mL | Freq: Once | TOPICAL | Status: AC
  Administered 2016-02-09: 15 mL via TOPICAL
  Filled 2016-02-09: qty 15

## 2016-02-09 NOTE — Progress Notes (Addendum)
Radiation Oncology         [phone] ________________________________  Name: Randy Hanson MRN: [id number]  Date: 02/09/2016  DOB: [birth year]  Follow-Up Visit Note  CC: Randy Dubonnet, MD  Randy Noble, MD  Diagnosis and Prior Radiotherapy:       ICD-9-CM ICD-10-CM   1. 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 The Right tonsil and bilateral neck were treated PTV High to 70 Gy in 35 fractions at 2 Gy per fraction. 2. The Right tonsil and bilateral neck were treated PTV Med to 63 Gy in 35 fractions at 1.8 Gy per fraction. 3. The Right tonsil and bilateral neck were treated PTV Low to 56 Gy in 35 fractions at 1.6 Gy per fraction.  Narrative:  The patient returns today for routine follow-up of radiation completed 10/16/15 to his Right Tonsil and bilateral neck.  The patient denies any pain issues. He is using a feeding tube, and reports minimal drainage, though he reports the site is irritated form tape. The patient reports some difficulty swallowing solid foods. The patient denies use of smoking or chewing tobacco. He reports using daily fluoride tray use.  The patient reports he "lost his hearing" approximately 3 weeks ago, and questions why this may have happened. He reports his sense of taste has mostly returned. He is still using his feeding tube for most of his nutrition. No Bone pain.     MBSS this month showed Moderate pharyngeal phase dysphagia;Severe pharyngeal phase dysphagia;Mild cervical esophageal phase dysphagia  His most recent PET scan was 02/08/16.  ALLERGIES:  is allergic to lisinopril.  Meds: Current Outpatient Prescriptions  Medication Sig Dispense Refill  . aspirin 81 MG tablet Take 81 mg by mouth daily.    Marland Kitchen docusate sodium (COLACE) 100 MG capsule Take 100 mg by mouth 2 (two) times daily.    . hydrochlorothiazide  (HYDRODIURIL) 25 MG tablet Take 25 mg by mouth daily.    Marland Kitchen HYDROcodone-acetaminophen (NORCO/VICODIN) 5-325 MG tablet     . lidocaine (XYLOCAINE) 2 % solution Patient: Mix 1part 2% viscous lidocaine, 1part H20. Swish and/or swallow 10mL of this mixture, before meals and at bedtime, up to QID 100 mL 5  . lidocaine-prilocaine (EMLA) cream Apply to affected area as needed 30 g 3  . LORazepam (ATIVAN) 0.5 MG tablet Take 1 tablet (0.5 mg total) by mouth every 6 (six) hours as needed (Nausea or vomiting). 30 tablet 0  . losartan (COZAAR) 50 MG tablet Take 50 mg by mouth daily.    . Nutritional Supplements (FEEDING SUPPLEMENT, OSMOLITE 1.5 CAL,) LIQD Begin Osmolite 1.5 or equivalent via PEG, 1 can QID with 120 cc free water before and after bolus. Increase by one can every 2 days until goal of 7 cans achieved. Give 30 mL promod or equivalent BID mixed with 6 oz water.  Flush with 60 cc free water after Promod. 7 Bottle 0  . pilocarpine (SALAGEN) 5 MG tablet Take 1 tablet (5 mg total) by mouth 2 (two) times daily. 60 tablet 3  . prochlorperazine (COMPAZINE) 10 MG tablet Take 1 tablet (10 mg total) by mouth every 6 (six) hours as needed (Nausea or vomiting). 30 tablet 1  . ranitidine (ZANTAC) 150 MG capsule Take 150 mg by mouth 2 (two) times daily.    Marland Kitchen terazosin (HYTRIN) 2 MG capsule Take  2 mg by mouth at bedtime.    . Wound Dressings (SONAFINE) Apply 1 application topically 3 (three) times daily.     No current facility-administered medications for this encounter.     Physical Findings: The patient is in no acute distress. Patient is alert and oriented. Wt Readings from Last 3 Encounters:  02/09/16 226 lb 9.6 oz (102.8 kg)  12/19/15 229 lb 3.2 oz (104 kg)  12/08/15 226 lb (102.5 kg)    weight is 226 lb 9.6 oz (102.8 kg). His oral temperature is 98.1 F (36.7 C). His blood pressure is 131/87 and his pulse is 102 (abnormal). His respiration is 12 and oxygen saturation is 98%. .  General: Alert  and oriented, in no acute distress HEENT: Head is normocephalic. Extraocular movements are intact. Oropharynx is notable for a white, hairy coating on his tongue that appears to harbor a couple of colonies of thrush. His mouth is very dry. Neck: Neck is notable for anterior neck lymphedema. No palpable cervical or supraclavicular masses. The skin has healed well in this area. Lymphedema. Heart: Heart regular in rate and rhythm, no murmurs. Chest: Lungs are clear bilaterally. Abdomen: Soft and nontender, nondistended. +PEG Extremities: no evidence of edema. Skin: Skin in treatment fields shows satisfactory healing, but is dry in the neck and anterior chest. Lymphatics: see Neck Exam Psychiatric: Judgment and insight are intact. Affect is appropriate.  PROCEDURE NOTE: After obtaining consent and anesthetizing the nasal cavity with topical lidocaine and phenylephrine, the flexible laryngoscope was introduced and passed through the nasal cavity. No evidence of disease recurrence in oropharynx/larynx .  Edema noted in the laryngeal area, consistent with healing and lymphedema of the neck.   Airway patent.  Cords symmetrically mobile.   Lab Findings: Lab Results  Component Value Date   WBC 5.6 01/30/2016   HGB 13.8 01/30/2016   HCT 39.6 01/30/2016   MCV 97 01/30/2016   PLT 207 01/30/2016    Lab Results  Component Value Date   TSH 0.324 11/09/2015    Radiographic Findings: Dg Op Swallowing Func-medicare/speech Path  Result Date: 01/30/2016  01/29/16 1300 SLP Visit Information SLP Received On 01/29/16 Pain Assessment Pain Assessment No/denies pain General Information Date of Onset 07/12/15 HPI Randy Hanson with h/o tonsillar cancer diagnosed 07/12/2015 referred for MBS.  Pt finished cancer treatment early August (chemoradiation) and has a feeding tube for nutrition.  Pt has been seeing an OP SlP and PT for dysphagia and lymphadema treatment.  Pt tx complicated by Patchy mucositis and erythema  in his oropharynx but no signs of thrush. Skin over neck was slightly hyperpigmented and dry per oncologist note July 2017.  Per SlP note, pt reports chronic problems with coughing up phlegm and difficulty swallowing - referring SLP suspects pharyngeal clearance issues and MBS recommended.  Pt denies pneumonias and has been maintaining weight by using feeding tube.  He has not consistently performed exercises.  Type of Study MBS-Modified Barium Swallow Study Previous Swallow Assessment (clinical evaluation and treatment, last visit early Nov 2017) Diet Prior to this Study Thin liquids (pt consuming water primarily) Temperature Spikes Noted No Respiratory Status Room air History of Recent Intubation No Behavior/Cognition Alert;Cooperative Oral Cavity Assessment Other (comment) (whitish coating on tongue and lateral buccal region) Oral Care Completed by SLP No Oral Cavity - Dentition Adequate natural dentition Vision Functional for self feeding Self-Feeding Abilities Able to feed self Patient Positioning Upright in chair Baseline Vocal Quality Normal;Other (comment) (pt reports voice is deeper  than normal) Volitional Cough Strong Volitional Swallow Able to elicit Anatomy Other (Comment) (appearance of edema throughout pharynx) Pharyngeal Secretions Standing secretions in (comment) (mixed with barium) Oral Motor/Sensory Function Overall Oral Motor/Sensory Function Other (comment) (h/o Bell's Palsy left side-left eyebrow asymmety on left) Oral Preparation/Oral Phase Oral Phase WFL Oral - Nectar Oral - Nectar Cup [facility];Piecemeal swallowing Oral - Thin Oral - Thin Cup [facility];Piecemeal swallowing Oral - Thin Straw WFL;Piecemeal swallowing Oral - Thin Teaspoon WFL;Piecemeal swallowing Oral - Solids Oral - Puree WFL;Piecemeal swallowing Pharyngeal Phase Pharyngeal Phase Impaired Pharyngeal - Nectar Pharyngeal- Nectar Cup Reduced pharyngeal peristalsis;Reduced epiglottic inversion;Reduced tongue base retraction;Pharyngeal residue  - valleculae;Compensatory strategies attempted (with notebox) Pharyngeal - Thin Pharyngeal- Thin Cup Reduced pharyngeal peristalsis;Reduced epiglottic inversion;Reduced tongue base retraction;Penetration/Apiration after swallow;Pharyngeal residue - valleculae Pharyngeal- Thin Straw Reduced pharyngeal peristalsis;Reduced epiglottic inversion;Reduced tongue base retraction;Penetration/Apiration after swallow;Trace aspiration;Pharyngeal residue - valleculae;Lateral channel residue Pharyngeal- Thin Teaspoon Reduced pharyngeal peristalsis;Reduced epiglottic inversion;Reduced tongue base retraction;Pharyngeal residue - valleculae;Compensatory strategies attempted (with notebox) Pharyngeal Material enters airway, remains ABOVE vocal cords then ejected out Pharyngeal Material enters airway, CONTACTS cords and not ejected out Pharyngeal - Solids Pharyngeal- Puree Reduced pharyngeal peristalsis;Reduced epiglottic inversion;Reduced tongue base retraction;Pharyngeal residue - valleculae Cervical Esophageal Phase Cervical Esophageal Phase Impaired Cervical Esophageal Phase - Nectar Nectar Cup Reduced cricopharyngeal relaxation Cervical Esophageal Phase - Thin Thin Cup Reduced cricopharyngeal relaxation Thin Straw Reduced cricopharyngeal relaxation Thin Teaspoon Reduced cricopharyngeal relaxation Cervical Esophageal Phase - Solids Puree Reduced cricopharyngeal relaxation Cervical Esophageal Phase - Comment Cervical Esophageal Comment ? decreased UES clearance  resulting in mild residuals- see radiologist note  Clinical Impression Therapy Diagnosis Moderate pharyngeal phase dysphagia;Severe pharyngeal phase dysphagia;Mild cervical esophageal phase dysphagia Clinical Impression Patient presents with moderately severe pharyngeal dysphagia with significant edema/? fibrosis impacting clearance/pharyngeal contraction/epiglottic deflection.  Gross vallecular residuals noted across all consistencies due to poor tongue base  retraction/epiglottic deflection. Patient largely senses residuals conducting reflexive dry swallows (up to 5) with each bolus. He also must frequent cough/"hock" and expectorate to clear.  Thicker consistencies resulted in much worsening residuals.  Multiple sequential swallows of thin were tolerated and did not worsen residuals significantly - suspect due to summation impact.  Pt did have trace aspiration of thin that cleared with reflexive cough/hock. Decreased clearance through proximal esophagus noted - ? impact of fibrosis and/or edema with only mild residuals.  Hopeful for pt's swallow to improve with continued exercises, lymphadema treatment and reduced edema.  Using live video, educated pt to findings and reinforced effective compensation strategies.  Please refer for repeat MBS when clinically indicated.  Impact on safety and function Moderate aspiration risk;Risk for inadequate nutrition/hydration Swallow Evaluation Recommendations SLP Diet Recommendations Nectar thick liquid;Thin liquid Liquid Administration via Cup;Straw Medication Administration Via alternative means Supervision Patient able to self feed Compensations Slow rate;Multiple dry swallows after each bite/sip;Other (Comment) (expectorate after bites/sips) Postural Changes Remain semi-upright after after feeds/meals (Comment);Seated upright at 90 degrees Treatment Plan Oral Care Recommendations Oral care QID Treatment Recommendations Defer treatment plan to f/u with SLP Follow up Recommendations Outpatient SLP Prognosis Prognosis for Safe Diet Advancement Fair Barriers to Reach Goals Time post onset;Severity of deficits Individuals Consulted Consulted and Agree with Results and Recommendations Patient;Family member/caregiver Family Member Consulted wife Progression Toward Goals Progression toward goals Progressing toward goals SLP Time Calculation SLP Start Time (ACUTE ONLY) 1425 SLP Stop Time (ACUTE ONLY) 1455 SLP Time Calculation (min)  (ACUTE ONLY) 30 min SLP G-Codes **NOT FOR INPATIENT CLASS** Functional Assessment Tool Used MBS,  clinical judgement Functional Limitations Swallowing Swallow Current Status (G4010) CL Swallow Goal Status (U7253) CL Swallow Discharge Status (G6440) CL SLP Evaluations $ SLP Speech Visit 1 Procedure SLP Evaluations $Swallowing Treatment 1 Procedure $MBS Swallow Outpatient 1 Procedure Donavan Burnet, MS [facility] SLP [id number] CLINICAL DATA:  History of head neck radiation.  Trouble swallowing. EXAM: MODIFIED BARIUM SWALLOW TECHNIQUE: Different consistencies of barium were administered orally to the patient by the Speech Pathologist. Imaging of the pharynx was performed in the lateral projection. FLUOROSCOPY TIME:  Fluoroscopy Time:  3 minutes Radiation Exposure Index (if provided by the fluoroscopic device): 37.1 mGy air kerma Number of Acquired Spot Images: 0 COMPARISON:  None. FINDINGS: Barium medial with consistencies ranging from thin to puree were provided by cup, spoon, and straw. Transient laryngeal penetration was noted mainly with thin liquid, which the patient would regurgitate to clear. Stasis was noted with all consistencies, prompting spontaneous regurgitation to relieve patient the "sticking" sensation. The stasis increased with increasing thickness of the meal. Airway protection was good throughout the study and aspiration was never definitely seen. Patient was concerned about a stricture in the mid throat and patient was evaluated AP and laterally for this purpose. Cricopharyngeus does cause of persistent impression and moderate narrowing, but this is not the level of patient's stasis, which is more the level of the oropharynx and hypopharynx where thickened irradiated tissues distort the normal anatomy. Attention to the esophageal verge on follow-up studies. IMPRESSION: Abnormal pharyngeal function study as described. Please refer to the Speech Pathologists report for complete details and recommendations.  Electronically Signed   By: Marnee Spring M.D.   On: 01/29/2016 16:23   Nm Pet Image Restag (ps) Skull Base To Thigh  Result Date: 02/08/2016 CLINICAL DATA:  Subsequent treatment strategy for tonsillar cancer. EXAM: NUCLEAR MEDICINE PET SKULL BASE TO THIGH TECHNIQUE: 11.43 mCi F-18 FDG was injected intravenously. Full-ring PET imaging was performed from the skull base to thigh after the radiotracer. CT data was obtained and used for attenuation correction and anatomic localization. FASTING BLOOD GLUCOSE:  Value: 122 mg/dl COMPARISON:  Outside PET-CT 07/18/2015. Outside chest CT 06/01/2015. CT the abdomen and pelvis 02/11/2006. FINDINGS: NECK Previously noted hypermetabolism in the region of the right palatine tonsil is no longer identified. No hypermetabolic lymph nodes in the neck. There are several areas of low-level hypermetabolism involving the posterior aspect of the oropharynx (SUVmax = 5.5)and the posterior aspect of the vocal cords bilaterally (SUVmax = 8.9 on the right and 6.9 on the left)) which are nonspecific. On CT images, these regions do not have discrete mass-like characteristics. CHEST No hypermetabolic mediastinal or hilar nodes. In the superior aspect of the left upper lobe abutting near the apex there is a 1.7 x 1.1 cm ground-glass attenuation nodule which demonstrates low-level hypermetabolism (SUVmax = 3.8), which is new compared to the prior examination from 06/01/2015, favored to represent an area of evolving postradiation pneumonitis. No other suspicious pulmonary nodules on the CT scan. No acute consolidative airspace disease. Trace right pleural effusion lying dependently. No left pleural effusion. Left-sided internal jugular single-lumen porta cath with tip terminating at the superior cavoatrial junction. There is aortic atherosclerosis, as well as atherosclerosis of the great vessels of the mediastinum and the coronary arteries, including calcified atherosclerotic plaque in the  left main, left anterior descending, left circumflex and right coronary arteries. ABDOMEN/PELVIS No abnormal hypermetabolic activity within the liver, pancreas, adrenal glands, or spleen. No hypermetabolic lymph nodes in the abdomen or pelvis. Small calcified  granulomas are noted in the liver. Multiple low-attenuation lesions are noted in the left kidney, incompletely characterized on today's noncontrast CT examination, but demonstrate no internal metabolic activity, likely to represent cysts, largest of which is exophytic in the lower pole measuring 6.9 x 5.2 cm. Small 2-3 mm nonobstructive calculi versus vascular calcifications in the renal hila bilaterally. No hydroureteronephrosis. Urinary bladder is normal in appearance. There is no significant volume of ascites. No pneumoperitoneum. No pathologic dilatation of small bowel. Percutaneous gastrostomy tube appears properly located. Large amount of dense material in the rectum and distal sigmoid colon, likely retained barium. Aortic atherosclerosis. SKELETON There are some small foci of low-level hypermetabolism in the sternum (SUVmax = 3.0- 3.6), without suspicious abnormality on the CT portion of the examination. In addition, there is hypermetabolism (SUVmax = 5.5) involving the left facet joint at T8, without a suspicious bony abnormality, presumably inflammatory. Low-level hypermetabolism (SUVmax = 4.6) involving the spinous process of L3, without any suspicious bony correlate. There is also hypermetabolism (SUVmax = 8.08) in the right side of the sacrum at the level of the S1 adjacent to the L5-S1 facet joint, with no suspicious bony correlate, favored to be inflammatory. No other definite suspicious lytic or blastic osseous lesions are noted on the CT portion of the examination. IMPRESSION: 1. Today's study demonstrates a positive response to therapy with regression of the original right-sided palatine tonsil lesion and associated right cervical  lymphadenopathy. Although there are some nonspecific areas of hypermetabolism involving the posterior aspect of the oropharynx and the vocal cords, these are favored to be inflammatory and/or physiologic. These areas should be amenable to direct inspection if clinically appropriate. At the very least, close attention to these regions on follow-up imaging is recommended to ensure resolution. 2. No definite signs of distant metastatic disease elsewhere in the chest, abdomen or pelvis. 3. There are several nonspecific areas of low-level osseous activity without corresponding suspicious abnormality on the CT portion of the examination. While the possibility of osseous metastatic disease is not excluded, it is not strongly favored at this time (most of these lesions are likely inflammatory), and close attention on followup studies is recommended. 4. Aortic atherosclerosis, in addition to left main and 3 vessel coronary artery disease. Assessment for potential risk factor modification, dietary therapy or pharmacologic therapy may be warranted, if clinically indicated. Electronically Signed   By: Trudie Reed M.D.   On: 02/08/2016 15:21    Impression/Plan:    1) Head and Neck Cancer Status: NED, PET shows SUV uptake in bones that is favored to be benign. Edema in neck/throat on exam.  2) Nutritional Status: Gaining weight, but still has difficulty swallowing solid foods. See below PEG tube: Yes, still needs this  3) Risk Factors: The patient has been educated about risk factors including alcohol and tobacco abuse; they understand that avoidance of alcohol and tobacco is important to prevent recurrences as well as other cancers  4) Swallowing: Some difficulty with solid foods. SLP followup.  ENT followup.  5) Dental: Encouraged to continue regular followup with dentistry, and dental hygiene including fluoride rinses.  6) Thyroid function:  Lab Results  Component Value Date   TSH 0.324 11/09/2015     7) Other: We spoke to the patient about his recent hearing loss. I discussed with the patient the possibility that this could be related to chemotherapy with cisplatin, but I would like to have him follow with ENT to make a formal diagnosis about this development. I will  refer the patient to Dr. Hezzie Bump to discuss his hearing loss, dysphagia, and feeling of food stuck in his throat.   8) In light of the patient's recent scan, I see no reason to schedule another PET scan at this time. Will ask at tumor board if any imaging is needed for bones - bone scan or repeat PET?  Follow up in Radiation Oncology in April. The patient was encouraged to call with any issues or questions before then.  9) Rx fluconazole for thrush  I asked Young Berry, RN, our Head and Neck Oncology Navigator to  contact Dr Hezzie Bump and his navigator, to arrange audiology work up for hearing loss, as well as evaluation with Dr Hezzie Bump for surveillance and dysphagia. He has a swallowing study in Care Everywhere.  I'm not sure if there is anything ENT can do to improve his swallowing.  Of note, I did a laryngoscopy showing NED but significant laryngeal edema, which may be related to his neck edema.  The edema might explain some of the swallowing challenges.  He will continue PT for the neck lymphedema.      Lonie Peak, MD  This document serves as a record of services personally performed by Lonie Peak, MD. It was created on her behalf by Lavenia Atlas, a trained medical scribe. The creation of this record is based on the scribe's personal observations and the provider's statements to them. This document has been checked and approved by the attending provider.

## 2016-02-09 NOTE — Progress Notes (Signed)
Mr. Najarro presents for follow up of radiation completed 10/16/15 to his Right Tonsil and bilateral neck.  Pain issues, if any: no Using a feeding tube?: yes, minimal drainage, site irritated from tape.  Discussed having periods of tube site open to air.   Swallowing issues, if any: yes, complains of difficultly swallowing solids Smoking or chewing tobacco? no Using fluoride trays daily? yes Last ENT visit was on: none on record Other notable issues, if any: reports approximately 3 weeks ago "lost his hearing" PET 02/08/16 WEIGHT/VS: BP 131/87   Pulse (!) 102   Temp 98.1 F (36.7 C) (Oral)   Resp 12   Wt 226 lb 9.6 oz (102.8 kg)   SpO2 98%   BMI 30.73 kg/m  Wt Readings from Last 3 Encounters:  02/09/16 226 lb 9.6 oz (102.8 kg)  12/19/15 229 lb 3.2 oz (104 kg)  12/08/15 226 lb (102.5 kg)

## 2016-02-12 ENCOUNTER — Telehealth: Payer: Self-pay

## 2016-02-12 ENCOUNTER — Ambulatory Visit: Payer: No Typology Code available for payment source

## 2016-02-12 ENCOUNTER — Other Ambulatory Visit: Payer: Self-pay | Admitting: Radiation Oncology

## 2016-02-12 ENCOUNTER — Ambulatory Visit: Payer: Medicare Other

## 2016-02-12 DIAGNOSIS — C090 Malignant neoplasm of tonsillar fossa: Secondary | ICD-10-CM

## 2016-02-12 MED ORDER — FLUCONAZOLE 100 MG PO TABS: ORAL_TABLET | ORAL | 0 refills | Status: DC

## 2016-02-12 NOTE — Telephone Encounter (Signed)
I called and spoke to [name] at the request of Dr. Isidore Moos. She wanted me to inform him that she called in a prescription for fluconazole to the [facility] on [address] to treat what appears to be thrush in his mouth. [name] voiced his understanding and knows to call me if he has any further questions.

## 2016-02-12 NOTE — Addendum Note (Signed)
Encounter addended by: Ernst Spell, RN on: 02/12/2016 10:38 AM<BR>    Actions taken: Charge Capture section accepted

## 2016-02-12 NOTE — Addendum Note (Signed)
Encounter addended by: Eppie Gibson, MD on: 02/12/2016  7:55 AM<BR>    Actions taken: Sign clinical note

## 2016-02-13 ENCOUNTER — Encounter: Payer: Self-pay | Admitting: Nutrition

## 2016-02-13 ENCOUNTER — Ambulatory Visit: Payer: Medicare Other | Attending: Radiation Oncology | Admitting: Physical Therapy

## 2016-02-13 DIAGNOSIS — I890 Lymphedema, not elsewhere classified: Secondary | ICD-10-CM | POA: Insufficient documentation

## 2016-02-13 DIAGNOSIS — R293 Abnormal posture: Secondary | ICD-10-CM | POA: Insufficient documentation

## 2016-02-13 DIAGNOSIS — R29898 Other symptoms and signs involving the musculoskeletal system: Secondary | ICD-10-CM | POA: Diagnosis not present

## 2016-02-13 MED ORDER — OSMOLITE 1.5 CAL PO LIQD: ORAL | 0 refills | Status: DC

## 2016-02-13 NOTE — Therapy (Signed)
[facility] [address] [place], [place], [address] Phone: [phone]   Fax:  [phone]  Physical Therapy Treatment  Patient Details  Name: Randy Hanson MRN: [id number] Date of Birth: 12/30/1940 Referring Provider: Lonie Peak, MD  Encounter Date: 02/13/2016      PT End of Session - 02/13/16 1656    Visit Number 14   Number of Visits 21   Date for PT Re-Evaluation 03/05/16   PT Start Time 1608   PT Stop Time 1648   PT Time Calculation (min) 40 min   Activity Tolerance Patient tolerated treatment well   Behavior During Therapy [facility] for tasks assessed/performed      Past Medical History:  Diagnosis Date  . Back pain   . BPH (benign prostatic hyperplasia)   . 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 DJD (degenerative joint disease)   . Esophageal reflux   . FHx: colonic polyps   . Hemorrhoids   . History of radiation therapy 08/28/15-- 10/16/15   Right Tonsil and bilateral neck  . Hypercholesteremia   . Hyperlipemia   . Hypertension   . Kidney cysts    [facility] [place] [facility]-2012  . Posterior vitreous detachment, left eye    DR. KATHERINE HECKER  AUGUST 2011  . Radiation 08/28/15- 10/16/15   Right Tonsil and Bilateral Neck    Past Surgical History:  Procedure Laterality Date  . CARDIAC CATHETERIZATION     remote >15 years ago  . IR GENERIC HISTORICAL  10/11/2015   IR PATIENT EVAL TECH 0-60 MINS 10/11/2015 Darrell K Allred, PA-C [facility]INTER[facility] RAD  . VASECTOMY  1975    There were no vitals filed for this visit.      Subjective Assessment - 02/13/16 1609    Subjective "The man come with the vest yesterday. I'm going to get a machine."     Currently in Pain? No/denies                         [facility] Adult PT Treatment/Exercise - 02/13/16 0001      Neck Exercises: Standing   Neck Retraction 10 reps;5 secs  isometrics against pillow against wall x 7, then AROM     Manual  Therapy   Myofascial Release O-A release attempted; pt. with tightness but difficulty relaxing   Manual Lymphatic Drainage (MLD) In supine with head elevated on pillows: supraclavicular fossae, 5 diaphragmatic breaths, bilateral axillae, bilateral shoulder collectors; bilat. chest and upper flank; posterolateral, lateral, anteriolateral, and anterior neck and suboccipital nodes; chin; cheek; then reinforcing pathways down to axillae.   Passive ROM Bil neck sidebend, rotation, and retraction; also right levator scapulae stretch; gentle manual cervical traction                PT Education - 02/13/16 1655    Education provided Yes   Education Details encouraged patient to work on erect posture with chin retraction   Person(s) Educated Patient   Methods Explanation   Comprehension Verbalized understanding                Long Term Clinic Goals - 02/06/16 1202      CC Long Term Goal  #1   Title Decrease circumference measurement at 8 cm proximal to sternal notch to 49.5 cm or less.   Baseline 12/28/15- 54.5, 01/10/16- 54 cm; 48.6 cm. AFTER manual lymph drainage on 01/31/16   Status Achieved  CC Long Term Goal  #2   Title Patient will be knowledgeable about lymphedema risk reduction practices.   Status Achieved     CC Long Term Goal  #3   Title Patient will be knowledgeable about compression garments and how to obtain them.   Status Achieved     CC Long Term Goal  #4   Title Patient and/or patient's wife will be independent in self manual lymph drainage.   Baseline 12/28/15- instructing patient's wife in technique today, 01/10/16- patient's wife feels independent with MLD technique   Status Achieved     CC Long Term Goal  #5   Title Patient and/or his wife will be independent in management of lymphedema with all tools they need, possibly to include a Flexitouch pump   Status On-going     CC Long Term Goal  #6   Title Pt will report a 50% improvement in neck  tightness to allow improved comfort   Time 4   Period Weeks   Status New     Additional Goals   Additional Goals Yes         Head and Neck Clinic Goals - 09/05/15 1149      Patient will be able to verbalize understanding of a home exercise program for cervical range of motion, posture, and walking.    Status Achieved     Patient will be able to verbalize understanding of proper sitting and standing posture.    Status Achieved     Patient will be able to verbalize understanding of lymphedema risk and availability of treatment for this condition.    Status Achieved           Plan - 02/13/16 1656    Clinical Impression Statement Patient came in today with anterior neck tissue feeling less indurated than usual, and the induration decreased further by the end of his session.  He has had his Flexitouch demo and expects to be getting a pump in a couple of weeks. He is wearing his compression band some and his wife has done some manual lymph drainage, though expresses some uncertainty about how much pressure she is supposed to be using.    Rehab Potential Good   Clinical Impairments Affecting Rehab Potential previous radiation   PT Frequency 2x / week   PT Duration 4 weeks   PT Treatment/Interventions ADLs/Self Care Home Management;Therapeutic exercise;Therapeutic activities;Gait training;DME Instruction;Patient/family education;Manual techniques;Manual lymph drainage;Scar mobilization;Passive range of motion;Taping   PT Next Visit Plan Cont P/ROM, manual lymph drainage and postural muscle strengthening.    PT Home Exercise Plan cervical extension AROM, chin retractions   Consulted and Agree with Plan of Care Patient      Patient will benefit from skilled therapeutic intervention in order to improve the following deficits and impairments:  Decreased range of motion, Decreased endurance, Increased edema, Postural dysfunction  Visit Diagnosis: Lymphedema, not  elsewhere classified  Abnormal posture  Other symptoms and signs involving the musculoskeletal system     Problem List Patient Active Problem List   Diagnosis Date Noted  . Carcinoma of tonsillar fossa (HCC) 07/19/2015  . 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 Abnormal EKG 04/22/2014  . Essential hypertension 04/22/2014  . Hyperlipidemia 04/22/2014    Domonique Brouillard 02/13/2016, 5:00 PM  [facility] [address] [place], [place], [address] Phone: [phone]   Fax:  [phone]  Name: DOMENICK CHATMON MRN: [id number] Date of Birth: Jul 20, 1940  Micheline Maze, PT 02/13/16  5:00 PM

## 2016-02-13 NOTE — Progress Notes (Signed)
Patient's wife continues to request decrease in tube feeding.  Patient completed tonsil cancer treatment in August 2017. No evidence of disease recurrence in oropharynx/larynx . He continues to use his feeding tube for enteral nutrition. Weight currently documented as 226.6 pounds with a BMI of 30.73.  Patient meets criteria for obesity.   Per patient's wife, patient is using four and half cans of Osmolite 1.5 instead of 7 cans which was prescribed during treatment.  Revised estimated nutrition needs: 2000-2200 calories, 85-100 grams protein, 2 L fluid.  4.5 cans Osmolite 1.5 provides 1598 cal and 67 g protein, and 815 mL free water  30 mL Promod BID provides 200 calories and 20 grams protein.  Total 1798 calories, 87 grams protein, 2135 mL free water.  Survivorship guidelines advise patients to maintain a healthy weight.  Since patient is obese, agree patient could continue slow safe weight loss.   New tube feeding would provide patient with a  200-calorie deficit to promote approximately 0.5 pound weight loss per week.  New orders written and [place] notified. Will monitor as needed and adjust tube feeding as needed.

## 2016-02-14 ENCOUNTER — Other Ambulatory Visit: Payer: Self-pay | Admitting: Radiation Oncology

## 2016-02-14 DIAGNOSIS — C090 Malignant neoplasm of tonsillar fossa: Secondary | ICD-10-CM

## 2016-02-16 ENCOUNTER — Telehealth: Payer: Self-pay | Admitting: *Deleted

## 2016-02-16 ENCOUNTER — Ambulatory Visit: Payer: Medicare Other | Admitting: Physical Therapy

## 2016-02-16 DIAGNOSIS — R293 Abnormal posture: Secondary | ICD-10-CM

## 2016-02-16 DIAGNOSIS — I890 Lymphedema, not elsewhere classified: Secondary | ICD-10-CM

## 2016-02-16 DIAGNOSIS — R29898 Other symptoms and signs involving the musculoskeletal system: Secondary | ICD-10-CM | POA: Diagnosis not present

## 2016-02-16 NOTE — Therapy (Signed)
[facility] [address] [place], [place], [address] Phone: [phone]   Fax:  [phone]  Physical Therapy Treatment  Patient Details  Name: Randy Hanson MRN: [id number] Date of Birth: 1941-02-11 Referring Provider: Lonie Peak, MD  Encounter Date: 02/16/2016      PT End of Session - 02/16/16 1134    Visit Number 15   Number of Visits 21   Date for PT Re-Evaluation 03/05/16   PT Start Time 0851   PT Stop Time 0934   PT Time Calculation (min) 43 min      Past Medical History:  Diagnosis Date  . Back pain   . BPH (benign prostatic hyperplasia)   . 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 DJD (degenerative joint disease)   . Esophageal reflux   . FHx: colonic polyps   . Hemorrhoids   . History of radiation therapy 08/28/15-- 10/16/15   Right Tonsil and bilateral neck  . Hypercholesteremia   . Hyperlipemia   . Hypertension   . Kidney cysts    [facility] [place] [facility]ABLE-2012  . Posterior vitreous detachment, left eye    DR. KATHERINE HECKER  AUGUST 2011  . Radiation 08/28/15- 10/16/15   Right Tonsil and Bilateral Neck    Past Surgical History:  Procedure Laterality Date  . CARDIAC CATHETERIZATION     remote >15 years ago  . IR GENERIC HISTORICAL  10/11/2015   IR PATIENT EVAL TECH 0-60 MINS 10/11/2015 Darrell K Allred, PA-C [facility]INTERV RAD  . VASECTOMY  1975    There were no vitals filed for this visit.      Subjective Assessment - 02/16/16 0851    Subjective Just reports dry mouth this morning.  Wore compression strap 3-4 hours yesterday.   Currently in Pain? No/denies               LYMPHEDEMA/ONCOLOGY QUESTIONNAIRE - 02/16/16 0854      Head and Neck   4 cm superior to sternal notch around neck 44.8 cm   6 cm superior to sternal notch around neck 47.8 cm   8 cm superior to sternal notch around neck 50.5 cm  measured before manual lymph drainage; 50 cm. after                   [facility] Adult PT Treatment/Exercise - 02/16/16 0001      Manual Therapy   Edema Management circumference measurements taken   Myofascial Release O-A release attempted; pt. with tightness but difficulty relaxing   Manual Lymphatic Drainage (MLD) In supine with head elevated on pillows: supraclavicular fossae, 5 diaphragmatic breaths, bilateral axillae, bilateral shoulder collectors; bilat. chest and upper flank; posterolateral, lateral, anteriolateral, and anterior neck and suboccipital nodes; chin; cheek; then reinforcing pathways down to axillae.   Passive ROM Bilateral sidebend and rotation stretches                        Long Term Clinic Goals - 02/06/16 1202      CC Long Term Goal  #1   Title Decrease circumference measurement at 8 cm proximal to sternal notch to 49.5 cm or less.   Baseline 12/28/15- 54.5, 01/10/16- 54 cm; 48.6 cm. AFTER manual lymph drainage on 01/31/16   Status Achieved     CC Long Term Goal  #2   Title Patient will be knowledgeable about lymphedema risk reduction practices.   Status Achieved  CC Long Term Goal  #3   Title Patient will be knowledgeable about compression garments and how to obtain them.   Status Achieved     CC Long Term Goal  #4   Title Patient and/or patient's wife will be independent in self manual lymph drainage.   Baseline 12/28/15- instructing patient's wife in technique today, 01/10/16- patient's wife feels independent with MLD technique   Status Achieved     CC Long Term Goal  #5   Title Patient and/or his wife will be independent in management of lymphedema with all tools they need, possibly to include a Flexitouch pump   Status On-going     CC Long Term Goal  #6   Title Pt will report a 50% improvement in neck tightness to allow improved comfort   Time 4   Period Weeks   Status New     Additional Goals   Additional Goals Yes         Head and Neck Clinic Goals - 09/05/15 1149       Patient will be able to verbalize understanding of a home exercise program for cervical range of motion, posture, and walking.    Status Achieved     Patient will be able to verbalize understanding of proper sitting and standing posture.    Status Achieved     Patient will be able to verbalize understanding of lymphedema risk and availability of treatment for this condition.    Status Achieved           Plan - 02/16/16 1135    Clinical Impression Statement Patient with some circumference reductions but not at most superior level of neck measured today compared to last measurement; however, he did reduce by 0.5 cm. during session at that level today.   Rehab Potential Good   Clinical Impairments Affecting Rehab Potential previous radiation   PT Frequency 2x / week   PT Duration 4 weeks   PT Treatment/Interventions ADLs/Self Care Home Management;Therapeutic exercise;Therapeutic activities;Gait training;DME Instruction;Patient/family education;Manual techniques;Manual lymph drainage;Scar mobilization;Passive range of motion;Taping   PT Next Visit Plan Cont P/ROM, manual lymph drainage and postural muscle strengthening.    PT Home Exercise Plan cervical extension AROM, chin retractions   Consulted and Agree with Plan of Care Patient      Patient will benefit from skilled therapeutic intervention in order to improve the following deficits and impairments:  Decreased range of motion, Decreased endurance, Increased edema, Postural dysfunction  Visit Diagnosis: Lymphedema, not elsewhere classified  Abnormal posture  Other symptoms and signs involving the musculoskeletal system     Problem List Patient Active Problem List   Diagnosis Date Noted  . Carcinoma of tonsillar fossa (HCC) 07/19/2015  . 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 Abnormal EKG 04/22/2014  . Essential hypertension 04/22/2014  . Hyperlipidemia 04/22/2014     Areanna Gengler 02/16/2016, 11:37 AM  [facility] [address] [place], [place], [address] Phone: [phone]   Fax:  [phone]  Name: Randy Hanson MRN: [id number] Date of Birth: 05-26-1940  Micheline Maze, PT 02/16/16 11:37 AM

## 2016-02-16 NOTE — Telephone Encounter (Signed)
Oncology Nurse Navigator Documentation  Received email from [place] indicating Mr. Harbeson has 12/12 1445 appt with Dr. Nicolette Bang.  She requested pre-authorization for audiology exam, I requested assist from [facility], [place].  Gayleen Orem, RN, BSN, [place] [facility] Oncology Nurse [place] at [place] [phone]

## 2016-02-16 NOTE — Telephone Encounter (Signed)
Oncology Nurse Navigator Documentation  Sent email to Dr. Servando [place]ina Nurse Navigator Fransico Setters requesting follow-up appt for evaluation of hearing loss, dysphagia, surveillance.  Referred to her 02/09/16 PN in [place] for further detail.  Reply received.  Gayleen Orem, RN, BSN, [place] [facility] Oncology Nurse [place] at [place] [phone]

## 2016-02-19 ENCOUNTER — Ambulatory Visit: Payer: Medicare Other | Admitting: Physical Therapy

## 2016-02-19 DIAGNOSIS — I890 Lymphedema, not elsewhere classified: Secondary | ICD-10-CM

## 2016-02-19 DIAGNOSIS — R293 Abnormal posture: Secondary | ICD-10-CM | POA: Diagnosis not present

## 2016-02-19 DIAGNOSIS — R29898 Other symptoms and signs involving the musculoskeletal system: Secondary | ICD-10-CM | POA: Diagnosis not present

## 2016-02-19 NOTE — Therapy (Signed)
[facility] [address] [place], [place], [address] Phone: [phone]   Fax:  [phone]  Physical Therapy Treatment  Patient Details  Name: Randy Hanson MRN: [id number] Date of Birth: [birth year] Referring Provider: Lonie Peak, MD  Encounter Date: 02/19/2016      PT End of Session - 02/19/16 1524    Visit Number 16   Number of Visits 21   Date for PT Re-Evaluation 03/05/16   PT Start Time 1438   PT Stop Time 1520   PT Time Calculation (min) 42 min   Activity Tolerance Patient tolerated treatment well   Behavior During Therapy [facility] for tasks assessed/performed      Past Medical History:  Diagnosis Date  . Back pain   . BPH (benign prostatic hyperplasia)   . 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 DJD (degenerative joint disease)   . Esophageal reflux   . FHx: colonic polyps   . Hemorrhoids   . History of radiation therapy 08/28/15-- 10/16/15   Right Tonsil and bilateral neck  . Hypercholesteremia   . Hyperlipemia   . Hypertension   . Kidney cysts    [facility] [place] [facility]-2012  . Posterior vitreous detachment, left eye    DR. KATHERINE HECKER  AUGUST 2011  . Radiation 08/28/15- 10/16/15   Right Tonsil and Bilateral Neck    Past Surgical History:  Procedure Laterality Date  . CARDIAC CATHETERIZATION     remote >15 years ago  . IR GENERIC HISTORICAL  10/11/2015   IR PATIENT EVAL TECH 0-60 MINS 10/11/2015 Darrell K Allred, PA-C [facility]INTER[facility] RAD  . VASECTOMY  1975    There were no vitals filed for this visit.      Subjective Assessment - 02/19/16 1439    Subjective Did some massage over the weekend and wore compression band an hour and a half this morning.   Currently in Pain? No/denies                         [facility] Adult PT Treatment/Exercise - 02/19/16 0001      Self-Care   Other Self-Care Comments  cut rectangle out of small dot peach Medi foam and instructed  patient to place it between skin and compression garment at anterior neck; also told pt. and his wife it was okay to trim it down a bit if needed, or round corners to make it more comfortable     Manual Therapy   Myofascial Release O-A release in supine   Manual Lymphatic Drainage (MLD) In supine with head elevated on pillows: supraclavicular fossae, 5 diaphragmatic breaths, bilateral axillae, bilateral shoulder collectors; bilat. chest; posterolateral, lateral, anteriolateral, and anterior neck and suboccipital nodes; chin; cheek; then reinforcing pathways down to axillae.   Passive ROM Bilateral sidebend and rotation, neck retraction stretches; stretch into neck flexion; gentle manual cervical traction                        Long Term Clinic Goals - 02/06/16 1202      CC Long Term Goal  #1   Title Decrease circumference measurement at 8 cm proximal to sternal notch to 49.5 cm or less.   Baseline 12/28/15- 54.5, 01/10/16- 54 cm; 48.6 cm. AFTER manual lymph drainage on 01/31/16   Status Achieved     CC Long Term Goal  #2   Title Patient will be knowledgeable about lymphedema risk  reduction practices.   Status Achieved     CC Long Term Goal  #3   Title Patient will be knowledgeable about compression garments and how to obtain them.   Status Achieved     CC Long Term Goal  #4   Title Patient and/or patient's wife will be independent in self manual lymph drainage.   Baseline 12/28/15- instructing patient's wife in technique today, 01/10/16- patient's wife feels independent with MLD technique   Status Achieved     CC Long Term Goal  #5   Title Patient and/or his wife will be independent in management of lymphedema with all tools they need, possibly to include a Flexitouch pump   Status On-going     CC Long Term Goal  #6   Title Pt will report a 50% improvement in neck tightness to allow improved comfort   Time 4   Period Weeks   Status New     Additional Goals    Additional Goals Yes         Head and Neck Clinic Goals - 09/05/15 1149      Patient will be able to verbalize understanding of a home exercise program for cervical range of motion, posture, and walking.    Status Achieved     Patient will be able to verbalize understanding of proper sitting and standing posture.    Status Achieved     Patient will be able to verbalize understanding of lymphedema risk and availability of treatment for this condition.    Status Achieved           Plan - 02/19/16 1527    Clinical Impression Statement Measurements not taken but patient looks good today; still with firm tissue at anterior neck.  Gave patient a rectangle of peach Medi small dot foam in thin stockinette to try between skin and his compression garment; advised that his garment should be snug but not tight.   Rehab Potential Good   Clinical Impairments Affecting Rehab Potential previous radiation   PT Frequency 2x / week   PT Duration 4 weeks   PT Treatment/Interventions ADLs/Self Care Home Management;Therapeutic exercise;Therapeutic activities;Gait training;DME Instruction;Patient/family education;Manual techniques;Manual lymph drainage;Scar mobilization;Passive range of motion;Taping   PT Next Visit Plan Remeasure,check posture, check goals; Cont P/ROM, manual lymph drainage and postural muscle strengthening.    PT Home Exercise Plan cervical extension AROM, chin retractions   Consulted and Agree with Plan of Care Patient      Patient will benefit from skilled therapeutic intervention in order to improve the following deficits and impairments:  Decreased range of motion, Decreased endurance, Increased edema, Postural dysfunction  Visit Diagnosis: Lymphedema, not elsewhere classified  Abnormal posture  Other symptoms and signs involving the musculoskeletal system     Problem List Patient Active Problem List   Diagnosis Date Noted  . Carcinoma of tonsillar  fossa (HCC) 07/19/2015  . 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 Abnormal EKG 04/22/2014  . Essential hypertension 04/22/2014  . Hyperlipidemia 04/22/2014    Lanessa Shill 02/19/2016, 3:31 PM  [facility] [address] [place], [place], [address] Phone: [phone]   Fax:  [phone]  Name: Randy Hanson MRN: [id number] Date of Birth: [birth year]  Micheline Maze, PT 02/19/16 3:31 PM

## 2016-02-20 DIAGNOSIS — Z87891 Personal history of nicotine dependence: Secondary | ICD-10-CM | POA: Diagnosis not present

## 2016-02-20 DIAGNOSIS — Z931 Gastrostomy status: Secondary | ICD-10-CM | POA: Diagnosis not present

## 2016-02-20 DIAGNOSIS — R1310 Dysphagia, unspecified: Secondary | ICD-10-CM | POA: Diagnosis not present

## 2016-02-20 DIAGNOSIS — Z923 Personal history of irradiation: Secondary | ICD-10-CM | POA: Diagnosis not present

## 2016-02-20 DIAGNOSIS — Z9221 Personal history of antineoplastic chemotherapy: Secondary | ICD-10-CM | POA: Diagnosis not present

## 2016-02-20 DIAGNOSIS — Z85818 Personal history of malignant neoplasm of other sites of lip, oral cavity, and pharynx: Secondary | ICD-10-CM | POA: Diagnosis not present

## 2016-02-21 ENCOUNTER — Ambulatory Visit: Payer: Medicare Other | Admitting: Physical Therapy

## 2016-02-21 DIAGNOSIS — R29898 Other symptoms and signs involving the musculoskeletal system: Secondary | ICD-10-CM

## 2016-02-21 DIAGNOSIS — I890 Lymphedema, not elsewhere classified: Secondary | ICD-10-CM | POA: Diagnosis not present

## 2016-02-21 DIAGNOSIS — R293 Abnormal posture: Secondary | ICD-10-CM | POA: Diagnosis not present

## 2016-02-21 NOTE — Therapy (Signed)
Goal  #2   Title Patient will be knowledgeable about lymphedema risk reduction practices.   Status Achieved     CC Long Term Goal  #3   Title Patient will be knowledgeable about compression garments and how to obtain them.   Status Achieved     CC Long Term Goal  #4   Title Patient and/or patient's wife will be independent in self manual lymph drainage.   Status Achieved     CC Long Term Goal  #5   Title Patient and/or his wife will be independent in management of lymphedema with all tools they need, possibly to include a Flexitouch pump   Status On-going         Head and Neck Clinic Goals - 09/05/15 1149      Patient will be able to verbalize understanding of a home exercise program for cervical range of motion, posture, and walking.    Status Achieved     Patient will be able to verbalize understanding of proper sitting and standing posture.     Status Achieved     Patient will be able to verbalize understanding of lymphedema risk and availability of treatment for this condition.    Status Achieved           Plan - 02/21/16 1250    Clinical Impression Statement Patient's upper neck measurements were improved today, and generally his anterior and lateral neck felt softer; patient also noticed this felt softer and smaller today.  This may be in part due to the new peach Medi small dot foam that he started using with his neck compression garment.  He expects to receive his Flexitouch in a few days; we will continue two times next week and hopefully he will have the Flexitouch set up and be trained in it by the time we discharge him.   Rehab Potential Good   Clinical Impairments Affecting Rehab Potential previous radiation   PT Frequency 2x / week   PT Duration 4 weeks   PT Treatment/Interventions ADLs/Self Care Home Management;Therapeutic exercise;Therapeutic activities;Gait training;DME Instruction;Patient/family education;Manual techniques;Manual lymph drainage;Scar mobilization;Passive range of motion;Taping   PT Next Visit Plan Check posture, check goals; Cont P/ROM, manual lymph drainage and postural muscle strengthening.    PT Home Exercise Plan cervical extension AROM, chin retractions   Consulted and Agree with Plan of Care Patient      Patient will benefit from skilled therapeutic intervention in order to improve the following deficits and impairments:  Decreased range of motion, Decreased endurance, Increased edema, Postural dysfunction  Visit Diagnosis: Lymphedema, not elsewhere classified  Abnormal posture  Other symptoms and signs involving the musculoskeletal system     Problem List Patient Active Problem List   Diagnosis Date Noted  . Carcinoma of tonsillar fossa ([place]) 07/19/2015  . Cancer of tonsil, palatine ([place]) 07/12/2015  . Abnormal EKG 04/22/2014  . Essential hypertension 04/22/2014  .  Hyperlipidemia 04/22/2014    [name],[name] 02/21/2016, 12:54 PM  [facility] [place], [place], [address] Phone: [phone]   Fax:  [phone]  Name: Randy Hanson MRN: [id number] Date of Birth: 28-Apr-1940   Serafina Royals, PT 02/21/16 12:54 PM  [place], [place], [address] Phone: [phone]   Fax:  [phone]  Physical Therapy Treatment  Patient Details  Name: Randy Hanson MRN: [id number] Date of Birth: Mar 05, 1941 Referring Provider: Eppie Gibson, MD  Encounter Date: 02/21/2016      PT End of Session - 02/21/16 1249    Visit Number 17   Number of Visits 21   Date for PT Re-Evaluation 03/05/16   PT Start Time 1018   PT Stop Time 1106   PT Time Calculation (min) 48 min   Activity Tolerance Patient tolerated treatment well   Behavior During Therapy [facility] for tasks assessed/performed      Past Medical History:  Diagnosis Date  . Back pain   . BPH (benign prostatic hyperplasia)   . Cancer of tonsil, palatine ([place]) 07/12/2015  . DJD (degenerative joint disease)   . Esophageal reflux   . FHx: colonic polyps   . Hemorrhoids   . History of radiation therapy 08/28/15-- 10/16/15   Right Tonsil and bilateral neck  . Hypercholesteremia   . Hyperlipemia   . Hypertension   . Kidney cysts    [facility] [place] [facility]-2012  . Posterior vitreous detachment, left eye    DR. KATHERINE HECKER  AUGUST 2011  . Radiation 08/28/15- 10/16/15   Right Tonsil and Bilateral Neck    Past Surgical History:  Procedure Laterality Date  . CARDIAC CATHETERIZATION     remote >15 years ago  . IR GENERIC HISTORICAL  10/11/2015   IR PATIENT EVAL TECH 0-60 MINS 10/11/2015 Darrell K Allred, PA-C [facility]  . VASECTOMY  1975    There were no vitals filed for this visit.      Subjective Assessment - 02/21/16 1024    Subjective They said the vest would ship in 3 days.  Was at [facility] yesterday; it looks like I might need a hearing aid.  Goes back on the 27th for swallowing testing.  The neck feels smaller and softer, though he only had time to try the Medi dot foam for a couple of hours.   Currently in Pain? No/denies                LYMPHEDEMA/ONCOLOGY QUESTIONNAIRE - 02/21/16 1101      Head and Neck   4 cm superior to sternal notch around neck 45.3 cm   6 cm superior to sternal notch around neck 46.7 cm   8 cm superior to sternal notch around neck 49.2 cm  measured after manual lymph drainage                  [facility] Adult PT Treatment/Exercise - 02/21/16 0001      Manual Therapy   Manual Lymphatic Drainage (MLD) In supine with head elevated on pillows: supraclavicular fossae, 5 diaphragmatic breaths, bilateral axillae, bilateral shoulder collectors; bilat. chest; posterolateral, lateral, anteriolateral, and anterior neck and suboccipital nodes; chin; cheek; then reinforcing pathways down to axillae.   Passive ROM Bilateral sidebend and rotation, neck retraction stretches; stretch into neck flexion                        Long Term Clinic Goals - 02/21/16 1252      CC Long Term Goal  #1   Title Decrease circumference measurement at 8 cm proximal to sternal notch to 49.5 cm or less.   Status Achieved     CC Long Term

## 2016-02-26 ENCOUNTER — Ambulatory Visit: Payer: Medicare Other | Admitting: Physical Therapy

## 2016-02-26 DIAGNOSIS — R293 Abnormal posture: Secondary | ICD-10-CM

## 2016-02-26 DIAGNOSIS — R29898 Other symptoms and signs involving the musculoskeletal system: Secondary | ICD-10-CM | POA: Diagnosis not present

## 2016-02-26 DIAGNOSIS — I890 Lymphedema, not elsewhere classified: Secondary | ICD-10-CM

## 2016-02-26 NOTE — Therapy (Signed)
[facility] [address] [place], [place], [address] Phone: [phone]   Fax:  [phone]  Physical Therapy Treatment  Patient Details  Name: Randy Hanson MRN: [id number] Date of Birth: [birth year] Referring Provider: Lonie Peak, MD  Encounter Date: 02/26/2016      PT End of Session - 02/26/16 1514    Visit Number 18   Number of Visits 21   Date for PT Re-Evaluation 03/05/16   PT Start Time 1429   PT Stop Time 1513   PT Time Calculation (min) 44 min   Activity Tolerance Patient tolerated treatment well   Behavior During Therapy [facility] for tasks assessed/performed      Past Medical History:  Diagnosis Date  . Back pain   . BPH (benign prostatic hyperplasia)   . 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 DJD (degenerative joint disease)   . Esophageal reflux   . FHx: colonic polyps   . Hemorrhoids   . History of radiation therapy 08/28/15-- 10/16/15   Right Tonsil and bilateral neck  . Hypercholesteremia   . Hyperlipemia   . Hypertension   . Kidney cysts    [facility] [place] [facility] STABLE-2012  . Posterior vitreous detachment, left eye    DR. KATHERINE HECKER  AUGUST 2011  . Radiation 08/28/15- 10/16/15   Right Tonsil and Bilateral Neck    Past Surgical History:  Procedure Laterality Date  . CARDIAC CATHETERIZATION     remote >15 years ago  . IR GENERIC HISTORICAL  10/11/2015   IR PATIENT EVAL TECH 0-60 MINS 10/11/2015 Darrell K Allred, PA-C [facility] RAD  . VASECTOMY  1975    There were no vitals filed for this visit.      Subjective Assessment - 02/26/16 1429    Subjective Got a call about the trainer coming out to show them the Flexitouch, but the machine hasn't come yet, and appointment here is a conflict for this Wednesday.  Has been using the Medi dot foam and compression garment.   Currently in Pain? No/denies                         [facility] Adult PT Treatment/Exercise -  02/26/16 0001      Manual Therapy   Myofascial Release O-A release in supine   Manual Lymphatic Drainage (MLD) In supine with head elevated on pillows: supraclavicular fossae, 5 diaphragmatic breaths, bilateral axillae, bilateral shoulder collectors; bilat. chest; posterolateral, lateral, anteriolateral, and anterior neck and suboccipital nodes; chin; cheek; then reinforcing pathways down to axillae.   Passive ROM passive neck retraction with head over end of bed x 10, passive neck extension in same position x 5; supine over towel roll passive bilat. pect minor stretches; with chin retracted, neck sidebend with concurrent shoulder depression each side; neck rotation stretches.                        Long Term Clinic Goals - 02/26/16 1517      CC Long Term Goal  #5   Title Patient and/or his wife will be independent in management of lymphedema with all tools they need, possibly to include a Flexitouch pump   Status On-going     CC Long Term Goal  #6   Title Pt will report a 50% improvement in neck tightness to allow improved comfort   Status On-going         Head  and Neck Clinic Goals - 09/05/15 1149      Patient will be able to verbalize understanding of a home exercise program for cervical range of motion, posture, and walking.    Status Achieved     Patient will be able to verbalize understanding of proper sitting and standing posture.    Status Achieved     Patient will be able to verbalize understanding of lymphedema risk and availability of treatment for this condition.    Status Achieved           Plan - 02/26/16 1514    Clinical Impression Statement Patient did well today with passive stretches to neck, including some new set-ups and including pect minor stretches lying over towel roll. He still holds himself in forward head posture and admits that he doesn't do much of the chin retraction exercises.  Waiting for Flexitouch to arrive at  his home and for training session. He reported feeling better at end of session than at beginning.   Rehab Potential Good   Clinical Impairments Affecting Rehab Potential previous radiation   PT Frequency 2x / week   PT Duration 4 weeks   PT Treatment/Interventions ADLs/Self Care Home Management;Therapeutic exercise;Therapeutic activities;Gait training;DME Instruction;Patient/family education;Manual techniques;Manual lymph drainage;Scar mobilization;Passive range of motion;Taping   PT Next Visit Plan Continue P/ROM, manual lymph drainage; consider postural muscle strenthening.   PT Home Exercise Plan cervical extension AROM, chin retractions   Consulted and Agree with Plan of Care Patient      Patient will benefit from skilled therapeutic intervention in order to improve the following deficits and impairments:  Decreased range of motion, Decreased endurance, Increased edema, Postural dysfunction  Visit Diagnosis: Lymphedema, not elsewhere classified  Abnormal posture  Other symptoms and signs involving the musculoskeletal system     Problem List Patient Active Problem List   Diagnosis Date Noted  . Carcinoma of tonsillar fossa ([facility]) 07/19/2015  . 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 Abnormal EKG 04/22/2014  . Essential hypertension 04/22/2014  . Hyperlipidemia 04/22/2014    Ellakate Gonsalves 02/26/2016, 3:17 PM  [facility] [address] [place], [place], [address] Phone: [phone]   Fax:  [phone]  Name: Randy Hanson MRN: [id number] Date of Birth: 08/10/1940   Micheline Maze, PT 02/26/16 3:18 PM

## 2016-02-28 ENCOUNTER — Ambulatory Visit: Payer: Medicare Other | Admitting: Physical Therapy

## 2016-02-28 DIAGNOSIS — I890 Lymphedema, not elsewhere classified: Secondary | ICD-10-CM | POA: Diagnosis not present

## 2016-02-28 DIAGNOSIS — R293 Abnormal posture: Secondary | ICD-10-CM

## 2016-02-28 DIAGNOSIS — R29898 Other symptoms and signs involving the musculoskeletal system: Secondary | ICD-10-CM | POA: Diagnosis not present

## 2016-02-28 NOTE — Therapy (Signed)
[facility] [address] [place], [place], [address] Phone: [phone]   Fax:  [phone]  Physical Therapy Treatment  Patient Details  Name: Randy Hanson MRN: [id number] Date of Birth: [birth year] Referring Provider: Lonie Peak, MD  Encounter Date: 02/28/2016      PT End of Session - 02/28/16 1714    Visit Number 19   Number of Visits 21   Date for PT Re-Evaluation 03/05/16   PT Start Time 1433   PT Stop Time 1514   PT Time Calculation (min) 41 min   Activity Tolerance Patient tolerated treatment well   Behavior During Therapy [facility] for tasks assessed/performed      Past Medical History:  Diagnosis Date  . Back pain   . BPH (benign prostatic hyperplasia)   . 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 DJD (degenerative joint disease)   . Esophageal reflux   . FHx: colonic polyps   . Hemorrhoids   . History of radiation therapy 08/28/15-- 10/16/15   Right Tonsil and bilateral neck  . Hypercholesteremia   . Hyperlipemia   . Hypertension   . Kidney cysts    [facility] [place] [facility]2012  . Posterior vitreous detachment, left eye    DR. KATHERINE HECKER  AUGUST 2011  . Radiation 08/28/15- 10/16/15   Right Tonsil and Bilateral Neck    Past Surgical History:  Procedure Laterality Date  . CARDIAC CATHETERIZATION     remote >15 years ago  . IR GENERIC HISTORICAL  10/11/2015   IR PATIENT EVAL TECH 0-60 MINS 10/11/2015 Darrell K Allred, PA-C [facility]  . VASECTOMY  1975    There were no vitals filed for this visit.      Subjective Assessment - 02/28/16 1435    Subjective The pump was delivered, but he hasn't had the training yet; it's supposed to be next week.  "My neck's a little stiff."  Reports his hearing seemed to come back a couple of days ago.               LYMPHEDEMA/ONCOLOGY QUESTIONNAIRE - 02/28/16 1508      Head and Neck   4 cm superior to sternal notch around neck 45.4 cm    6 cm superior to sternal notch around neck 46.7 cm   8 cm superior to sternal notch around neck 48.8 cm                  [facility] Adult PT Treatment/Exercise - 02/28/16 0001      Manual Therapy   Manual Therapy Manual Traction   Edema Management circumference measurements taken   Myofascial Release O-A release in supine   Manual Lymphatic Drainage (MLD) In supine with head elevated on pillows: supraclavicular fossae, 5 diaphragmatic breaths, bilateral axillae, bilateral shoulder collectors; bilat. chest; posterolateral, lateral, anteriolateral, and anterior neck and suboccipital nodes; chin; cheek; then reinforcing pathways down to axillae.   Manual Traction gentle manual cervical traction                        Long Term Clinic Goals - 02/28/16 1717      CC Long Term Goal  #1   Title Decrease circumference measurement at 8 cm proximal to sternal notch to 49.5 cm or less.   Baseline 12/28/15- 54.5, 01/10/16- 54 cm; 48.6 cm. AFTER manual lymph drainage on 01/31/16; 48.8 on 02/28/16   Status Achieved     CC  Long Term Goal  #2   Title Patient will be knowledgeable about lymphedema risk reduction practices.   Status Achieved     CC Long Term Goal  #3   Title Patient will be knowledgeable about compression garments and how to obtain them.   Status Achieved     CC Long Term Goal  #4   Title Patient and/or patient's wife will be independent in self manual lymph drainage.   Status Achieved     CC Long Term Goal  #5   Title Patient and/or his wife will be independent in management of lymphedema with all tools they need, possibly to include a Flexitouch pump   Status Achieved     CC Long Term Goal  #6   Title Pt will report a 50% improvement in neck tightness to allow improved comfort   Status Not Met         Head and Neck Clinic Goals - 09/05/15 1149      Patient will be able to verbalize understanding of a home exercise program for cervical range of  motion, posture, and walking.    Status Achieved     Patient will be able to verbalize understanding of proper sitting and standing posture.    Status Achieved     Patient will be able to verbalize understanding of lymphedema risk and availability of treatment for this condition.    Status Achieved           Plan - 2016-03-20 1715    Clinical Impression Statement Patient is doing well overall.  His circumference measurements have decreased and he feels his compression garment (something he came up with for this) along with the peach Medi dot foam are working well to soften and reduce his swelling.  His wife feels able to do manual lymph drainage appropriately.  The Flexitouch pump has been delivered, although they haven't been trained in it yet.  Goals are met and he is ready for discharge.   Rehab Potential Good   Clinical Impairments Affecting Rehab Potential previous radiation   PT Treatment/Interventions ADLs/Self Care Home Management;Therapeutic exercise;Therapeutic activities;Gait training;DME Instruction;Patient/family education;Manual techniques;Manual lymph drainage;Scar mobilization;Passive range of motion;Taping   PT Next Visit Plan No further visits planned.   PT Home Exercise Plan cervical extension AROM, chin retractions, manual lymph drainage   Consulted and Agree with Plan of Care Patient      Patient will benefit from skilled therapeutic intervention in order to improve the following deficits and impairments:  Decreased range of motion, Decreased endurance, Increased edema, Postural dysfunction  Visit Diagnosis: Lymphedema, not elsewhere classified  Abnormal posture       G-Codes - 2016/03/20 1718    Functional Assessment Tool Used clinical judgement   Functional Limitation Self care   Self Care Goal Status ([id number]) At least 1 percent but less than 20 percent impaired, limited or restricted   Self Care Discharge Status [phone]) At least 1 percent  but less than 20 percent impaired, limited or restricted      Problem List Patient Active Problem List   Diagnosis Date Noted  . Carcinoma of tonsillar fossa ([facility]) 07/19/2015  . 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 Abnormal EKG 04/22/2014  . Essential hypertension 04/22/2014  . Hyperlipidemia 04/22/2014    Radiah Lubinski March 20, 2016, 5:20 PM  [facility] [address] [place], [place], [address] Phone: [phone]   Fax:  [phone]  Name: Randy Hanson MRN: [id number] Date of Birth:  07-19-1940  PHYSICAL THERAPY DISCHARGE SUMMARY  Visits from Start of Care: 19  Current functional level related to goals / functional outcomes: All but one goal met as noted above.   Remaining deficits: Still with poor posture and neck stiffness; lymphedema will require continued management.   Education / Equipment: Self-manual lymph drainage, ROM HEP, use of compression garment.  Plan: Patient agrees to discharge.  Patient goals were partially met. Patient is being discharged due to meeting the stated rehab goals.  ?????  He had one goal not met, but is ready for discharge.  Micheline Maze, PT 02/28/16 5:23 PM

## 2016-03-06 DIAGNOSIS — R1312 Dysphagia, oropharyngeal phase: Secondary | ICD-10-CM | POA: Diagnosis not present

## 2016-03-12 ENCOUNTER — Ambulatory Visit: Payer: Medicare Other

## 2016-03-12 ENCOUNTER — Ambulatory Visit: Payer: No Typology Code available for payment source

## 2016-03-15 DIAGNOSIS — R1312 Dysphagia, oropharyngeal phase: Secondary | ICD-10-CM | POA: Diagnosis not present

## 2016-03-22 DIAGNOSIS — R1312 Dysphagia, oropharyngeal phase: Secondary | ICD-10-CM | POA: Diagnosis not present

## 2016-04-03 ENCOUNTER — Ambulatory Visit: Payer: Medicare Other

## 2016-04-03 ENCOUNTER — Other Ambulatory Visit (HOSPITAL_BASED_OUTPATIENT_CLINIC_OR_DEPARTMENT_OTHER): Payer: Medicare Other

## 2016-04-03 ENCOUNTER — Ambulatory Visit (HOSPITAL_BASED_OUTPATIENT_CLINIC_OR_DEPARTMENT_OTHER): Payer: Medicare Other | Admitting: Hematology & Oncology

## 2016-04-03 DIAGNOSIS — C099 Malignant neoplasm of tonsil, unspecified: Secondary | ICD-10-CM | POA: Diagnosis not present

## 2016-04-03 DIAGNOSIS — R1310 Dysphagia, unspecified: Secondary | ICD-10-CM

## 2016-04-03 DIAGNOSIS — E032 Hypothyroidism due to medicaments and other exogenous substances: Secondary | ICD-10-CM

## 2016-04-03 LAB — CMP (CANCER CENTER ONLY)
ALT(SGPT): 18 U/L (ref 10–47)
AST: 24 U/L (ref 11–38)
Albumin: 3.5 g/dL (ref 3.3–5.5)
Alkaline Phosphatase: 62 U/L (ref 26–84)
BUN: 13 mg/dL (ref 7–22)
CALCIUM: 9.8 mg/dL (ref 8.0–10.3)
CO2: 28 meq/L (ref 18–33)
Chloride: 105 mEq/L (ref 98–108)
Creat: 0.9 mg/dl (ref 0.6–1.2)
GLUCOSE: 133 mg/dL — AB (ref 73–118)
POTASSIUM: 4 meq/L (ref 3.3–4.7)
Sodium: 141 mEq/L (ref 128–145)
Total Bilirubin: 0.7 mg/dl (ref 0.20–1.60)
Total Protein: 6.5 g/dL (ref 6.4–8.1)

## 2016-04-03 LAB — CBC WITH DIFFERENTIAL (CANCER CENTER ONLY)
BASO#: 0 10*3/uL (ref 0.0–0.2)
BASO%: 0.3 % (ref 0.0–2.0)
EOS%: 3.5 % (ref 0.0–7.0)
Eosinophils Absolute: 0.1 10*3/uL (ref 0.0–0.5)
HEMATOCRIT: 37.6 % — AB (ref 38.7–49.9)
HGB: 12.7 g/dL — ABNORMAL LOW (ref 13.0–17.1)
LYMPH#: 0.3 10*3/uL — AB (ref 0.9–3.3)
LYMPH%: 10.8 % — AB (ref 14.0–48.0)
MCH: 32.3 pg (ref 28.0–33.4)
MCHC: 33.8 g/dL (ref 32.0–35.9)
MCV: 96 fL (ref 82–98)
MONO#: 0.3 10*3/uL (ref 0.1–0.9)
MONO%: 9.8 % (ref 0.0–13.0)
NEUT#: 2.2 10*3/uL (ref 1.5–6.5)
NEUT%: 75.6 % (ref 40.0–80.0)
PLATELETS: 175 10*3/uL (ref 145–400)
RBC: 3.93 10*6/uL — ABNORMAL LOW (ref 4.20–5.70)
RDW: 12.6 % (ref 11.1–15.7)
WBC: 2.9 10*3/uL — ABNORMAL LOW (ref 4.0–10.0)

## 2016-04-03 LAB — TSH: TSH: 0.971 m(IU)/L (ref 0.320–4.118)

## 2016-04-03 LAB — LACTATE DEHYDROGENASE: LDH: 155 U/L (ref 125–245)

## 2016-04-03 NOTE — Progress Notes (Signed)
Hematology and Oncology Follow Up Visit  Randy Hanson [id number] Oct 19, 1940 76 y.o. 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  He is still having a lot of odynophagia and dysphagia. He is taking physical therapy for swallowing at [facility]. Has a couple more sessions left. He is only able to swallow liquids. He cannot swallow although much at one time. He still has his feeding tube in. This really is his "lifeline".  Thankfully, his PET scan that was done in November did not show any evidence of residual disease. This is no surprise.  He is not having pain. He just has a hard time swallowing. Again he cannot swallow anything solid.  He's had no fever. He's had no bleeding. He's had no problems with bowels or bladder.  Overall, his performance status is ECOG 1.     Medications:  Allergies as of 04/03/2016      Reactions   Lisinopril Cough      Medication List       Accurate as of 04/03/16 11:36 AM. Always use your most recent med list.          acetaminophen 325 MG tablet Commonly known as:  TYLENOL Take 650 mg by mouth every 6 (six) hours as needed (takes at night).   docusate sodium 100 MG capsule Commonly known as:  COLACE Take 100 mg by mouth 2 (two) times daily.   feeding supplement (OSMOLITE 1.5 CAL) Liqd Decrease bolus Osmolite 1.5 or equivalent to 4.5 cans daily with 200 cc free water 6 times daily. Continue 30 mL promod BID with 60 cc free water flush.   HYDROcodone-acetaminophen 5-325 MG tablet Commonly known as:  NORCO/VICODIN   lidocaine-prilocaine cream Commonly known as:  EMLA Apply to affected area as needed   LORazepam 0.5 MG tablet Commonly known as:  ATIVAN Take 1 tablet (0.5 mg total) by mouth every 6 (six) hours as needed  (Nausea or vomiting).   pilocarpine 5 MG tablet Commonly known as:  SALAGEN Take 1 tablet (5 mg total) by mouth 2 (two) times daily.   prochlorperazine 10 MG tablet Commonly known as:  COMPAZINE Take 1 tablet (10 mg total) by mouth every 6 (six) hours as needed (Nausea or vomiting).   ranitidine 150 MG capsule Commonly known as:  ZANTAC Take 150 mg by mouth 2 (two) times daily.   SONAFINE Apply 1 application topically 3 (three) times daily.   terazosin 2 MG capsule Commonly known as:  HYTRIN Take 2 mg by mouth at bedtime.       Allergies:  Allergies  Allergen Reactions  . Lisinopril Cough    Past Medical History, Surgical history, Social history, and Family History were reviewed and updated.  Review of Systems: All other 10 point review of systems is negative.   Physical Exam:  vitals were not taken for this visit.  Wt Readings from Last 3 Encounters:  04/03/16 216 lb 4 oz (98.1 kg)  02/09/16 226 lb 9.6 oz (102.8 kg)  12/19/15 229 lb 3.2 oz (104 kg)    Head and neck exam shows no ocular lesions. He does have minimal erythema and mucositis in the soft palate. His oral mucosa is somewhat dry. He has erythema on his neck. There  is no radiation dermatitis breakdown on his neck.No obvious adenopathy is noted on the neck. Thyroid is nonpalpable. Lungs are clear. Cardiac exam regular rate and rhythm with no murmurs, rubs or bruits. Abdomen is soft. His feeding tube is in the upper left quadrant. This is intact. He has decent bowel sounds. He has some slight tenderness at the G-tube site. There is no palpable liver or spleen tip. Back exam shows no tenderness over the spine, ribs or hips. Extremities shows no clubbing, cyanosis or edema. Skin exam is slightly dry. Neurological exam shows no focal neurological deficits.   Lab Results  Component Value Date   WBC 2.9 (L) 04/03/2016   HGB 12.7 (L) 04/03/2016   HCT 37.6 (L) 04/03/2016   MCV 96 04/03/2016   PLT 175 04/03/2016    No results found for: FERRITIN, IRON, TIBC, UIBC, IRONPCTSAT Lab Results  Component Value Date   RBC 3.93 (L) 04/03/2016   No results found for: KPAFRELGTCHN, LAMBDASER, KAPLAMBRATIO No results found for: IGGSERUM, IGA, IGMSERUM No results found for: Marda Stalker, SPEI   Chemistry      Component Value Date/Time   NA 141 04/03/2016 0859   NA 140 01/30/2016 1122   K 4.0 04/03/2016 0859   K 4.4 01/30/2016 1122   CL 105 04/03/2016 0859   CO2 28 04/03/2016 0859   CO2 26 01/30/2016 1122   BUN 13 04/03/2016 0859   BUN 19.6 01/30/2016 1122   CREATININE 0.9 04/03/2016 0859   CREATININE 0.9 01/30/2016 1122      Component Value Date/Time   CALCIUM 9.8 04/03/2016 0859   CALCIUM 10.2 01/30/2016 1122   ALKPHOS 62 04/03/2016 0859   ALKPHOS 78 01/30/2016 1122   AST 24 04/03/2016 0859   AST 19 01/30/2016 1122   ALT 18 04/03/2016 0859   ALT 15 01/30/2016 1122   BILITOT 0.70 04/03/2016 0859   BILITOT 0.29 01/30/2016 1122     Impression and Plan: Randy Hanson is a very pleasant 89- yo white male with stage I  HPV+ - squamous cell carcinoma the right tonsil. He completed radiation and chemotherapy in early August.   I am thankful that the PET scan does not show any active disease. I would think that he is cured. The squamous cell carcinomas of the head and neck that are HPV positive, do tend to respond very nicely to treatment.   I just feel bad that he is having all issues with swallowing. Hopefully, he will improve with his swallowing. It sounds like the swallowing physical therapy that he is taking might be helping.   I would not think that he would need an upper endoscopy to see if there is a stricture.   I don't think that he needs any additional scans. I would only do another scan if he begins to have symptoms.  I will plan to see him back in 6 weeks. We will flush his Port-A-Cath. I would keep his Port-A-Cath in for right now until  I know that he is able to swallow. Thankfully, he has his feeding tube in.   [name], MD 1/24/201811:36 AM

## 2016-04-08 NOTE — Therapy (Deleted)
[facility] [address] [place], [place], [address] Phone: [phone]   Fax:  [phone]  Speech Language Pathology Treatment  Patient Details  Name: Randy Hanson MRN: [id number] Date of Birth: Jan 02, 1941 Referring Provider: Eppie Gibson MD  Encounter Date: 04/08/2016    Past Medical History:  Diagnosis Date  . Back pain   . BPH (benign prostatic hyperplasia)   . 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 DJD (degenerative joint disease)   . Esophageal reflux   . FHx: colonic polyps   . Hemorrhoids   . History of radiation therapy 08/28/15-- 10/16/15   Right Tonsil and bilateral neck  . Hypercholesteremia   . Hyperlipemia   . Hypertension   . Kidney cysts    [facility] [place] [facility]-2012  . Posterior vitreous detachment, left eye    DR. KATHERINE HECKER  AUGUST 2011  . Radiation 08/28/15- 10/16/15   Right Tonsil and Bilateral Neck    Past Surgical History:  Procedure Laterality Date  . CARDIAC CATHETERIZATION     remote >15 years ago  . IR GENERIC HISTORICAL  10/11/2015   IR PATIENT EVAL TECH 0-60 MINS 10/11/2015 Darrell K Allred, PA-C [facility]-INTERV RAD  . VASECTOMY  1975    There were no vitals filed for this visit.                 SLP Short Term Goals - 01/15/16 1544      SLP SHORT TERM GOAL #1   Title pt will demo HEP with occasional min A   Time 1   Period --  visit (not met in Nov, cont to dec 2017, STGs 1-2)   Status Revised     SLP SHORT TERM GOAL #2   Title pt will tell SLP why he is completing HEP   Time 1   Period --  visit   Status On-going     SLP SHORT TERM GOAL #3   Title pt will follow swallow precautions following possible modified barium swallow exam   Time 1   Period --  visit (December 2017)   Status New          SLP Long Term Goals - 01/15/16 1546      SLP LONG TERM GOAL #1   Title pt will complete HEP with rare min A   Time 2   Period --   visits (until Januray 2018, all LTGs)   Status Revised     SLP LONG TERM GOAL #2   Title pt will tell SLP how a food journal can assist pt in returining to more normalized food intake   Time 2   Period --  visits    Status On-going     SLP LONG TERM GOAL #3   Title pt will tell SLP 3 overt s/s aspiration PNA over two sessions   Baseline one session 12-11-15   Time 2   Period --  visits    Status On-going        Patient will benefit from skilled therapeutic intervention in order to improve the following deficits and impairments:   No diagnosis found.    Problem List Patient Active Problem List   Diagnosis Date Noted  . Carcinoma of tonsillar fossa ([place]) 07/19/2015  . 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 Abnormal EKG 04/22/2014  . Essential hypertension 04/22/2014  . Hyperlipidemia 04/22/2014    [facility] 04/08/2016, 4:59 PM  [place]  [facility] [address] [place], [place], [address] Phone: [phone]   Fax:  [phone]   Name: Randy Hanson MRN: [id number] Date of Birth: 1940/05/03

## 2016-04-08 NOTE — Therapy (Signed)
[facility] [address] [place], [place], [address] Phone: [phone]   Fax:  [phone]  Patient Details  Name: Randy Hanson MRN: [id number] Date of Birth: 04/08/1940 Referring Provider:  Eppie Gibson, MD  Encounter Date: 04/08/2016  SPEECH THERAPY DISCHARGE SUMMARY  Visits from Start of Care: Four  Current functional level related to goals / functional outcomes: Pt was called to schedule follow up ST and was told that he is now receiving ST at [facility] and that is sufficient for him at this time. Pt will be formally d/c'd from ST at this time.  Goals at his last ST visit on 01-15-16 were as follows.  SLP Short Term Goals - 01/15/16 1544              SLP SHORT TERM GOAL #1    Title pt will demo HEP with occasional min A    Time 1    Period --  visit (not met in Nov, cont to dec 2017, STGs 1-2)    Status Revised         SLP SHORT TERM GOAL #2    Title pt will tell SLP why he is completing HEP    Time 1    Period --  visit    Status On-going         SLP SHORT TERM GOAL #3    Title pt will follow swallow precautions following possible modified barium swallow exam    Time 1    Period --  visit (December 2017)    Status New                       SLP Long Term Goals - 01/15/16 1546              SLP LONG TERM GOAL #1    Title pt will complete HEP with rare min A    Time 2    Period --  visits (until Januray 2018, all LTGs)    Status Revised         SLP LONG TERM GOAL #2    Title pt will tell SLP how a food journal can assist pt in returining to more normalized food intake    Time 2    Period --  visits     Status On-going         SLP LONG TERM GOAL #3    Title pt will tell SLP 3 overt s/s aspiration PNA over two sessions    Baseline one session 12-11-15    Time 2    Period --  visits     Status On-going     All goals marked as "ongoing" were not met.   Remaining deficits: See above  re: goals not met.   Education / Equipment: HEP, late effects head/neck radiation on swallowing skills, overt s/s aspiration PNA.  Plan: Patient agrees to discharge.  Patient goals were not met. Patient is being discharged due to the patient's request.  ?????He is receiving ST at [facility] ,[place], [place]  04/08/2016, 4:59 PM  [facility] [address] [place], [place], [address] Phone: [phone]   Fax:  [phone]

## 2016-04-10 DIAGNOSIS — R1312 Dysphagia, oropharyngeal phase: Secondary | ICD-10-CM | POA: Diagnosis not present

## 2016-04-16 DIAGNOSIS — R1312 Dysphagia, oropharyngeal phase: Secondary | ICD-10-CM | POA: Diagnosis not present

## 2016-04-23 DIAGNOSIS — R1312 Dysphagia, oropharyngeal phase: Secondary | ICD-10-CM | POA: Diagnosis not present

## 2016-05-01 DIAGNOSIS — R1312 Dysphagia, oropharyngeal phase: Secondary | ICD-10-CM | POA: Diagnosis not present

## 2016-05-09 DIAGNOSIS — R1310 Dysphagia, unspecified: Secondary | ICD-10-CM | POA: Diagnosis not present

## 2016-05-09 DIAGNOSIS — R933 Abnormal findings on diagnostic imaging of other parts of digestive tract: Secondary | ICD-10-CM | POA: Diagnosis not present

## 2016-05-09 DIAGNOSIS — R633 Feeding difficulties: Secondary | ICD-10-CM | POA: Diagnosis not present

## 2016-05-16 ENCOUNTER — Ambulatory Visit (HOSPITAL_BASED_OUTPATIENT_CLINIC_OR_DEPARTMENT_OTHER): Payer: Medicare Other | Admitting: Hematology & Oncology

## 2016-05-16 ENCOUNTER — Other Ambulatory Visit (HOSPITAL_BASED_OUTPATIENT_CLINIC_OR_DEPARTMENT_OTHER): Payer: Medicare Other

## 2016-05-16 ENCOUNTER — Ambulatory Visit: Payer: Medicare Other

## 2016-05-16 VITALS — BP 125/62 | HR 65 | Temp 98.1°F | Resp 18 | Wt 209.0 lb

## 2016-05-16 DIAGNOSIS — R1310 Dysphagia, unspecified: Secondary | ICD-10-CM

## 2016-05-16 DIAGNOSIS — C099 Malignant neoplasm of tonsil, unspecified: Secondary | ICD-10-CM | POA: Diagnosis not present

## 2016-05-16 DIAGNOSIS — C090 Malignant neoplasm of tonsillar fossa: Secondary | ICD-10-CM

## 2016-05-16 DIAGNOSIS — E032 Hypothyroidism due to medicaments and other exogenous substances: Secondary | ICD-10-CM

## 2016-05-16 LAB — CBC WITH DIFFERENTIAL (CANCER CENTER ONLY)
BASO#: 0 10*3/uL (ref 0.0–0.2)
BASO%: 0.3 % (ref 0.0–2.0)
EOS%: 2.8 % (ref 0.0–7.0)
Eosinophils Absolute: 0.1 10*3/uL (ref 0.0–0.5)
HCT: 37.7 % — ABNORMAL LOW (ref 38.7–49.9)
HGB: 12.9 g/dL — ABNORMAL LOW (ref 13.0–17.1)
LYMPH#: 0.4 10*3/uL — ABNORMAL LOW (ref 0.9–3.3)
LYMPH%: 13.7 % — ABNORMAL LOW (ref 14.0–48.0)
MCH: 32.3 pg (ref 28.0–33.4)
MCHC: 34.2 g/dL (ref 32.0–35.9)
MCV: 95 fL (ref 82–98)
MONO#: 0.4 10*3/uL (ref 0.1–0.9)
MONO%: 12.7 % (ref 0.0–13.0)
NEUT%: 70.5 % (ref 40.0–80.0)
NEUTROS ABS: 2.3 10*3/uL (ref 1.5–6.5)
Platelets: 173 10*3/uL (ref 145–400)
RBC: 3.99 10*6/uL — AB (ref 4.20–5.70)
RDW: 12.8 % (ref 11.1–15.7)
WBC: 3.2 10*3/uL — AB (ref 4.0–10.0)

## 2016-05-16 LAB — COMPREHENSIVE METABOLIC PANEL
ALBUMIN: 4 g/dL (ref 3.5–5.0)
ALK PHOS: 64 U/L (ref 40–150)
ALT: 10 U/L (ref 0–55)
AST: 20 U/L (ref 5–34)
Anion Gap: 7 mEq/L (ref 3–11)
BUN: 15 mg/dL (ref 7.0–26.0)
CHLORIDE: 105 meq/L (ref 98–109)
CO2: 28 mEq/L (ref 22–29)
Calcium: 10 mg/dL (ref 8.4–10.4)
Creatinine: 0.9 mg/dL (ref 0.7–1.3)
EGFR: 82 mL/min/{1.73_m2} — AB (ref >90)
GLUCOSE: 121 mg/dL (ref 70–140)
POTASSIUM: 4.3 meq/L (ref 3.5–5.1)
SODIUM: 140 meq/L (ref 136–145)
Total Bilirubin: 0.37 mg/dL (ref 0.20–1.20)
Total Protein: 6.7 g/dL (ref 6.4–8.3)

## 2016-05-16 MED ORDER — HEPARIN SOD (PORK) LOCK FLUSH 100 UNIT/ML IV SOLN
500.0000 [IU] | Freq: Once | INTRAVENOUS | Status: AC | PRN
  Administered 2016-05-16: 500 [IU]
  Filled 2016-05-16: qty 5

## 2016-05-16 MED ORDER — SODIUM CHLORIDE 0.9 % IJ SOLN
10.0000 mL | INTRAMUSCULAR | Status: DC | PRN
  Administered 2016-05-16: 10 mL
  Filled 2016-05-16: qty 10

## 2016-05-16 NOTE — Progress Notes (Signed)
Hematology and Oncology Follow Up Visit  Randy Hanson [id number] 12-Oct-1940 76 y.o. 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  He is still having a lot of odynophagia and dysphagia. It sounds like he will be going to [facility] for esophageal dilation. Hopefully, this had to be repeated.  Otherwise, he does seem to be doing pretty well. He is not hurting. He's had no vomiting. He's had no constipation or diarrhea. He has had no issues with the feeding tube. Thankfully, the feeding tube is how he is getting his nutrition. He is able to drink liquids.  I told him that the effects of radiation chemotherapy easily in the last another 4-6 months. He completed radiation back in August 2017. It may take until August 2018 before he finally can swallow better.  Thankfully, he's had no problem with infections.  Overall, his performance status is ECOG 1.     Medications:  Allergies as of 05/16/2016      Reactions   Lisinopril Cough      Medication List       Accurate as of 05/16/16 12:33 PM. Always use your most recent med list.          acetaminophen 325 MG tablet Commonly known as:  TYLENOL Take 650 mg by mouth every 6 (six) hours as needed (takes at night).   docusate sodium 100 MG capsule Commonly known as:  COLACE Take 100 mg by mouth 2 (two) times daily.   feeding supplement (OSMOLITE 1.5 CAL) Liqd Decrease bolus Osmolite 1.5 or equivalent to 4.5 cans daily with 200 cc free water 6 times daily. Continue 30 mL promod BID with 60 cc free water flush.   HYDROcodone-acetaminophen 5-325 MG tablet Commonly known as:  NORCO/VICODIN   lidocaine-prilocaine cream Commonly known as:  EMLA Apply to affected area as needed   LORazepam 0.5 MG tablet Commonly known as:   ATIVAN Take 1 tablet (0.5 mg total) by mouth every 6 (six) hours as needed (Nausea or vomiting).   pilocarpine 5 MG tablet Commonly known as:  SALAGEN Take 1 tablet (5 mg total) by mouth 2 (two) times daily.   prochlorperazine 10 MG tablet Commonly known as:  COMPAZINE Take 1 tablet (10 mg total) by mouth every 6 (six) hours as needed (Nausea or vomiting).   ranitidine 150 MG capsule Commonly known as:  ZANTAC Take 150 mg by mouth 2 (two) times daily.   SONAFINE Apply 1 application topically 3 (three) times daily.   terazosin 2 MG capsule Commonly known as:  HYTRIN Take 2 mg by mouth at bedtime.       Allergies:  Allergies  Allergen Reactions  . Lisinopril Cough    Past Medical History, Surgical history, Social history, and Family History were reviewed and updated.  Review of Systems: All other 10 point review of systems is negative.   Physical Exam:  weight is 209 lb (94.8 kg). His oral temperature is 98.1 F (36.7 C). His blood pressure is 125/62 and his pulse is 65. His respiration is 18 and oxygen saturation is 99%.   Wt Readings from Last 3 Encounters:  05/16/16 209 lb (94.8 kg)  04/03/16 216 lb 4 oz (98.1 kg)  02/09/16 226 lb 9.6 oz (102.8  kg)    Head and neck exam shows no ocular lesions. He does have minimal erythema and mucositis in the soft palate. His oral mucosa is somewhat dry. He has erythema on his neck. There is no radiation dermatitis breakdown on his neck.No obvious adenopathy is noted on the neck. Thyroid is nonpalpable. Lungs are clear. Cardiac exam regular rate and rhythm with no murmurs, rubs or bruits. Abdomen is soft. His feeding tube is in the upper left quadrant. This is intact. He has decent bowel sounds. He has some slight tenderness at the G-tube site. There is no palpable liver or spleen tip. Back exam shows no tenderness over the spine, ribs or hips. Extremities shows no clubbing, cyanosis or edema. Skin exam is slightly dry. Neurological  exam shows no focal neurological deficits.   Lab Results  Component Value Date   WBC 3.2 (L) 05/16/2016   HGB 12.9 (L) 05/16/2016   HCT 37.7 (L) 05/16/2016   MCV 95 05/16/2016   PLT 173 05/16/2016   No results found for: FERRITIN, IRON, TIBC, UIBC, IRONPCTSAT Lab Results  Component Value Date   RBC 3.99 (L) 05/16/2016   No results found for: KPAFRELGTCHN, LAMBDASER, KAPLAMBRATIO No results found for: IGGSERUM, IGA, IGMSERUM No results found for: Marda Stalker, SPEI   Chemistry      Component Value Date/Time   NA 141 04/03/2016 0859   NA 140 01/30/2016 1122   K 4.0 04/03/2016 0859   K 4.4 01/30/2016 1122   CL 105 04/03/2016 0859   CO2 28 04/03/2016 0859   CO2 26 01/30/2016 1122   BUN 13 04/03/2016 0859   BUN 19.6 01/30/2016 1122   CREATININE 0.9 04/03/2016 0859   CREATININE 0.9 01/30/2016 1122      Component Value Date/Time   CALCIUM 9.8 04/03/2016 0859   CALCIUM 10.2 01/30/2016 1122   ALKPHOS 62 04/03/2016 0859   ALKPHOS 78 01/30/2016 1122   AST 24 04/03/2016 0859   AST 19 01/30/2016 1122   ALT 18 04/03/2016 0859   ALT 15 01/30/2016 1122   BILITOT 0.70 04/03/2016 0859   BILITOT 0.29 01/30/2016 1122     Impression and Plan: Randy Hanson is a very pleasant 36- yo white male with stage I  HPV+ - squamous cell carcinoma the right tonsil. He completed radiation and chemotherapy in early August.   I'll plan to see him back in 6 weeks. I don't see that we had to do any scans on him. I really believe that he will be cured because he had HPV associated malignancy.   I just feel bad that he is still try to get through the side effects of radiation and chemotherapy.  He really has a great support system that is helping him out. has his feeding tube in.   Randy Macho, MD 3/8/201812:33 PM

## 2016-05-21 DIAGNOSIS — Z931 Gastrostomy status: Secondary | ICD-10-CM | POA: Diagnosis not present

## 2016-05-21 DIAGNOSIS — L905 Scar conditions and fibrosis of skin: Secondary | ICD-10-CM | POA: Diagnosis not present

## 2016-05-21 DIAGNOSIS — Z923 Personal history of irradiation: Secondary | ICD-10-CM | POA: Diagnosis not present

## 2016-05-21 DIAGNOSIS — Z9221 Personal history of antineoplastic chemotherapy: Secondary | ICD-10-CM | POA: Diagnosis not present

## 2016-05-21 DIAGNOSIS — C099 Malignant neoplasm of tonsil, unspecified: Secondary | ICD-10-CM | POA: Diagnosis not present

## 2016-05-21 DIAGNOSIS — Z87891 Personal history of nicotine dependence: Secondary | ICD-10-CM | POA: Diagnosis not present

## 2016-05-21 DIAGNOSIS — R1310 Dysphagia, unspecified: Secondary | ICD-10-CM | POA: Diagnosis not present

## 2016-05-21 DIAGNOSIS — Z8589 Personal history of malignant neoplasm of other organs and systems: Secondary | ICD-10-CM | POA: Diagnosis not present

## 2016-05-27 DIAGNOSIS — I10 Essential (primary) hypertension: Secondary | ICD-10-CM | POA: Diagnosis not present

## 2016-05-27 DIAGNOSIS — K219 Gastro-esophageal reflux disease without esophagitis: Secondary | ICD-10-CM | POA: Diagnosis not present

## 2016-05-27 DIAGNOSIS — K222 Esophageal obstruction: Secondary | ICD-10-CM | POA: Diagnosis not present

## 2016-05-27 DIAGNOSIS — Z931 Gastrostomy status: Secondary | ICD-10-CM | POA: Diagnosis not present

## 2016-05-27 DIAGNOSIS — R252 Cramp and spasm: Secondary | ICD-10-CM | POA: Diagnosis not present

## 2016-05-27 DIAGNOSIS — Z923 Personal history of irradiation: Secondary | ICD-10-CM | POA: Diagnosis not present

## 2016-05-27 DIAGNOSIS — R1312 Dysphagia, oropharyngeal phase: Secondary | ICD-10-CM | POA: Diagnosis not present

## 2016-05-27 DIAGNOSIS — C099 Malignant neoplasm of tonsil, unspecified: Secondary | ICD-10-CM | POA: Diagnosis not present

## 2016-05-27 DIAGNOSIS — R1310 Dysphagia, unspecified: Secondary | ICD-10-CM | POA: Diagnosis not present

## 2016-06-03 NOTE — Progress Notes (Signed)
error 

## 2016-06-19 ENCOUNTER — Ambulatory Visit
Admission: RE | Admit: 2016-06-19 | Discharge: 2016-06-19 | Disposition: A | Discharge status: Home / Self Care | Payer: No Typology Code available for payment source | Source: Ambulatory Visit | Attending: Radiation Oncology | Admitting: Radiation Oncology

## 2016-06-20 DIAGNOSIS — Z931 Gastrostomy status: Secondary | ICD-10-CM | POA: Diagnosis not present

## 2016-06-20 DIAGNOSIS — Z87891 Personal history of nicotine dependence: Secondary | ICD-10-CM | POA: Diagnosis not present

## 2016-06-20 DIAGNOSIS — Z8589 Personal history of malignant neoplasm of other organs and systems: Secondary | ICD-10-CM | POA: Diagnosis not present

## 2016-06-20 DIAGNOSIS — R1312 Dysphagia, oropharyngeal phase: Secondary | ICD-10-CM | POA: Diagnosis not present

## 2016-06-20 DIAGNOSIS — R1313 Dysphagia, pharyngeal phase: Secondary | ICD-10-CM | POA: Diagnosis not present

## 2016-06-20 DIAGNOSIS — Z923 Personal history of irradiation: Secondary | ICD-10-CM | POA: Diagnosis not present

## 2016-06-20 DIAGNOSIS — Z9221 Personal history of antineoplastic chemotherapy: Secondary | ICD-10-CM | POA: Diagnosis not present

## 2016-06-20 DIAGNOSIS — Z85818 Personal history of malignant neoplasm of other sites of lip, oral cavity, and pharynx: Secondary | ICD-10-CM | POA: Diagnosis not present

## 2016-06-25 ENCOUNTER — Telehealth: Payer: Self-pay | Admitting: Medical Oncology

## 2016-06-25 NOTE — Telephone Encounter (Signed)
Confirmed xrt appt.

## 2016-06-26 NOTE — Progress Notes (Signed)
error 

## 2016-06-27 ENCOUNTER — Other Ambulatory Visit (HOSPITAL_BASED_OUTPATIENT_CLINIC_OR_DEPARTMENT_OTHER): Payer: Medicare Other

## 2016-06-27 ENCOUNTER — Ambulatory Visit: Payer: Medicare Other

## 2016-06-27 ENCOUNTER — Ambulatory Visit (HOSPITAL_BASED_OUTPATIENT_CLINIC_OR_DEPARTMENT_OTHER): Payer: Medicare Other | Admitting: Hematology & Oncology

## 2016-06-27 VITALS — BP 119/68 | HR 80 | Temp 98.1°F | Resp 18 | Wt 201.0 lb

## 2016-06-27 DIAGNOSIS — C099 Malignant neoplasm of tonsil, unspecified: Secondary | ICD-10-CM | POA: Diagnosis not present

## 2016-06-27 DIAGNOSIS — R945 Abnormal results of liver function studies: Secondary | ICD-10-CM

## 2016-06-27 DIAGNOSIS — R1310 Dysphagia, unspecified: Secondary | ICD-10-CM

## 2016-06-27 DIAGNOSIS — E032 Hypothyroidism due to medicaments and other exogenous substances: Secondary | ICD-10-CM

## 2016-06-27 DIAGNOSIS — R7989 Other specified abnormal findings of blood chemistry: Secondary | ICD-10-CM

## 2016-06-27 LAB — CBC WITH DIFFERENTIAL (CANCER CENTER ONLY)
BASO#: 0 10*3/uL (ref 0.0–0.2)
BASO%: 0.6 % (ref 0.0–2.0)
EOS%: 2 % (ref 0.0–7.0)
Eosinophils Absolute: 0.1 10*3/uL (ref 0.0–0.5)
HEMATOCRIT: 39.1 % (ref 38.7–49.9)
HGB: 13.2 g/dL (ref 13.0–17.1)
LYMPH#: 0.5 10*3/uL — AB (ref 0.9–3.3)
LYMPH%: 9.1 % — AB (ref 14.0–48.0)
MCH: 31.9 pg (ref 28.0–33.4)
MCHC: 33.8 g/dL (ref 32.0–35.9)
MCV: 94 fL (ref 82–98)
MONO#: 0.7 10*3/uL (ref 0.1–0.9)
MONO%: 13.2 % — ABNORMAL HIGH (ref 0.0–13.0)
NEUT#: 3.8 10*3/uL (ref 1.5–6.5)
NEUT%: 75.1 % (ref 40.0–80.0)
PLATELETS: 219 10*3/uL (ref 145–400)
RBC: 4.14 10*6/uL — ABNORMAL LOW (ref 4.20–5.70)
RDW: 13.2 % (ref 11.1–15.7)
WBC: 5.1 10*3/uL (ref 4.0–10.0)

## 2016-06-27 LAB — CMP (CANCER CENTER ONLY)
ALK PHOS: 381 U/L — AB (ref 26–84)
ALT: 175 U/L — AB (ref 10–47)
AST: 91 U/L — ABNORMAL HIGH (ref 11–38)
Albumin: 3.1 g/dL — ABNORMAL LOW (ref 3.3–5.5)
BILIRUBIN TOTAL: 0.9 mg/dL (ref 0.20–1.60)
BUN: 21 mg/dL (ref 7–22)
CALCIUM: 9.7 mg/dL (ref 8.0–10.3)
CO2: 30 meq/L (ref 18–33)
CREATININE: 1.1 mg/dL (ref 0.6–1.2)
Chloride: 102 mEq/L (ref 98–108)
GLUCOSE: 145 mg/dL — AB (ref 73–118)
Potassium: 3.7 mEq/L (ref 3.3–4.7)
Sodium: 140 mEq/L (ref 128–145)
Total Protein: 6.8 g/dL (ref 6.4–8.1)

## 2016-06-27 NOTE — Progress Notes (Signed)
Hematology and Oncology Follow Up Visit  Randy Hanson [id number] 1940/12/17 76 y.o. 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  Things just are not going well for him with swallowing. Randy Hanson was being treated at [facility] to try to help with swallowing. Randy Hanson apparently had a procedure in which his esophagus was dilated. This was done on March 19. Somehow, after this, Randy Hanson was told that Randy Hanson cannot swallow again and that if Randy Hanson tried that Randy Hanson would aspirate into his lungs.  I'm just in shock over this period I never would've expected that Randy Hanson would have a permanent issue with swallowing.  This is been a real "hit" to his mental state. Thankfully, his wife is doing her best to try to encourage him.  Randy Hanson does drink some liquid every now and then. Randy Hanson's had no shortness of breath. Randy Hanson's had no coughing.  Randy Hanson says Randy Hanson's been taking a lot of Tylenol to help rest at nighttime.  Randy Hanson's had no rashes. Randy Hanson's had no fever.  His wife says Randy Hanson's had some bleeding from his PEG tube.   Overall, his performance status is ECOG 1.     Medications:  Allergies as of 06/27/2016      Reactions   Lisinopril Cough      Medication List       Accurate as of 06/27/16  6:06 PM. Always use your most recent med list.          acetaminophen 325 MG tablet Commonly known as:  TYLENOL Take 650 mg by mouth every 6 (six) hours as needed (takes at night).   HYDROcodone-acetaminophen 5-325 MG tablet Commonly known as:  NORCO/VICODIN   lidocaine-prilocaine cream Commonly known as:  EMLA Apply to affected area as needed   LORazepam 0.5 MG tablet Commonly known as:  ATIVAN Take 1 tablet (0.5 mg total) by mouth every 6 (six) hours as needed (Nausea or vomiting).   ranitidine 150 MG capsule Commonly  known as:  ZANTAC Take 150 mg by mouth 2 (two) times daily.   SONAFINE Apply 1 application topically 3 (three) times daily.   terazosin 2 MG capsule Commonly known as:  HYTRIN Take 2 mg by mouth at bedtime.       Allergies:  Allergies  Allergen Reactions  . Lisinopril Cough    Past Medical History, Surgical history, Social history, and Family History were reviewed and updated.  Review of Systems: All other 10 point review of systems is negative.   Physical Exam:  weight is 201 lb (91.2 kg). His oral temperature is 98.1 F (36.7 C). His blood pressure is 119/68 and his pulse is 80. His respiration is 18 and oxygen saturation is 97%.   Wt Readings from Last 3 Encounters:  06/27/16 201 lb (91.2 kg)  05/16/16 209 lb (94.8 kg)  04/03/16 216 lb 4 oz (98.1 kg)    Head and neck exam shows no ocular lesions. Randy Hanson does have minimal erythema and mucositis in the soft palate. His oral mucosa is somewhat dry. Randy Hanson has erythema on his neck. There is no radiation dermatitis breakdown on his neck.No obvious adenopathy is noted on the neck. Thyroid is nonpalpable. Lungs are clear. Cardiac exam regular rate and rhythm with no murmurs, rubs or  bruits. Abdomen is soft. His feeding tube is in the upper left quadrant. This is intact. Randy Hanson has decent bowel sounds. Randy Hanson has some slight tenderness at the G-tube site. There is no palpable liver or spleen tip. Back exam shows no tenderness over the spine, ribs or hips. Extremities shows no clubbing, cyanosis or edema. Skin exam is slightly dry. Neurological exam shows no focal neurological deficits.   Lab Results  Component Value Date   WBC 5.1 06/27/2016   HGB 13.2 06/27/2016   HCT 39.1 06/27/2016   MCV 94 06/27/2016   PLT 219 06/27/2016   No results found for: FERRITIN, IRON, TIBC, UIBC, IRONPCTSAT Lab Results  Component Value Date   RBC 4.14 (L) 06/27/2016   No results found for: KPAFRELGTCHN, LAMBDASER, KAPLAMBRATIO No results found for: IGGSERUM,  IGA, IGMSERUM No results found for: Marda Stalker, SPEI   Chemistry      Component Value Date/Time   NA 140 06/27/2016 1434   NA 140 05/16/2016 1101   K 3.7 06/27/2016 1434   K 4.3 05/16/2016 1101   CL 102 06/27/2016 1434   CO2 30 06/27/2016 1434   CO2 28 05/16/2016 1101   BUN 21 06/27/2016 1434   BUN 15.0 05/16/2016 1101   CREATININE 1.1 06/27/2016 1434   CREATININE 0.9 05/16/2016 1101      Component Value Date/Time   CALCIUM 9.7 06/27/2016 1434   CALCIUM 10.0 05/16/2016 1101   ALKPHOS 381 (H) 06/27/2016 1434   ALKPHOS 64 05/16/2016 1101   AST 91 (H) 06/27/2016 1434   AST 20 05/16/2016 1101   ALT 175 (H) 06/27/2016 1434   ALT 10 05/16/2016 1101   BILITOT 0.90 06/27/2016 1434   BILITOT 0.37 05/16/2016 1101     Impression and Plan: Randy Hanson is a very pleasant 72- yo white male with stage I  HPV+ - squamous cell carcinoma the right tonsil. Randy Hanson completed radiation and chemotherapy in early August.   I am absolutely shocked that Randy Hanson was told that Randy Hanson needs the G-tube in permanently. I cannot recall the patient ever having permanent damage from radiation. I'm still hoping that there will be an improvement in his swallowing that Randy Hanson can eat.  I am not sure why his liver tests are so elevated. Randy Hanson says Randy Hanson has been taking a lot of Tylenol. This might be the reason. I told her to stop taking Tylenol.  I will get a ultrasound of his liver. I want to try to get this tomorrow.. If we have to have his liver function tests done next week. Randy Hanson apparently has a PET scan set up for next week. I cannot imagine that Randy Hanson would have metastatic disease to the liver.  I just feel bad for Randy Hanson. Randy Hanson has tried his best. Randy Hanson got through treatment quite nicely.  I want to see him back in 3 weeks.  I spent about 40 minutes with Randy Hanson and his wife. I totally was not expecting him to be telling me that Randy Hanson cannot swallow anymore nor was I expected him to have  elevated liver tests.[name], MD 4/19/20186:06 PM

## 2016-06-28 ENCOUNTER — Ambulatory Visit
Admission: RE | Admit: 2016-06-28 | Discharge: 2016-06-28 | Disposition: A | Discharge status: Home / Self Care | Payer: No Typology Code available for payment source | Source: Ambulatory Visit | Attending: Radiation Oncology | Admitting: Radiation Oncology

## 2016-06-28 ENCOUNTER — Ambulatory Visit (HOSPITAL_COMMUNITY)
Admission: RE | Admit: 2016-06-28 | Discharge: 2016-06-28 | Disposition: A | Discharge status: Home / Self Care | Payer: Medicare Other | Source: Ambulatory Visit | Attending: Hematology & Oncology | Admitting: Hematology & Oncology

## 2016-06-28 ENCOUNTER — Inpatient Hospital Stay
Admission: RE | Admit: 2016-06-28 | Payer: No Typology Code available for payment source | Source: Ambulatory Visit | Admitting: Radiation Oncology

## 2016-06-28 DIAGNOSIS — R945 Abnormal results of liver function studies: Secondary | ICD-10-CM | POA: Diagnosis not present

## 2016-06-28 DIAGNOSIS — K8020 Calculus of gallbladder without cholecystitis without obstruction: Secondary | ICD-10-CM | POA: Insufficient documentation

## 2016-06-28 DIAGNOSIS — R7989 Other specified abnormal findings of blood chemistry: Secondary | ICD-10-CM

## 2016-06-28 DIAGNOSIS — C099 Malignant neoplasm of tonsil, unspecified: Secondary | ICD-10-CM | POA: Diagnosis not present

## 2016-06-28 DIAGNOSIS — N281 Cyst of kidney, acquired: Secondary | ICD-10-CM | POA: Diagnosis not present

## 2016-06-28 LAB — TSH: TSH: 1.066 m(IU)/L (ref 0.320–4.118)

## 2016-07-01 ENCOUNTER — Telehealth: Payer: Self-pay | Admitting: *Deleted

## 2016-07-01 NOTE — Telephone Encounter (Signed)
CALLED PATIENT TO INFORM OF APPT. ON 07-05-16, SPOKE WITH PATIENT'S WIFE- SANDRA AND SHE IS AWARE OF THIS APPT.

## 2016-07-02 ENCOUNTER — Telehealth: Payer: Self-pay | Admitting: *Deleted

## 2016-07-02 NOTE — Telephone Encounter (Addendum)
-----   Message from Volanda Napoleon, MD sent at 06/30/2016  8:13 PM EDT ----- Called patients wife with the news that patients thyroid is ok!! Also his  liver looks great!!! He may have some large gallstones. NO tylenol!!!! The gallstones may be causing the elevated liver tests!!!

## 2016-07-02 NOTE — Progress Notes (Signed)
Randy Hanson presents for follow up of radiation completed:   08/28/2015-10/16/2015: 1. The Right tonsil and bilateral neck were treated PTV High to 70 Gy in 35 fractions at 2 Gy per fraction. 2. The Right tonsil and bilateral neck were treated PTV Med to 63 Gy in 35 fractions at 1.8 Gy per fraction. 3. The Right tonsil and bilateral neck were treated PTV Low to 56 Gy in 35 fractions at 1.6 Gy per fraction.  Pain issues, if any: He denies pain.  Using a feeding tube?: Yes, He is instilling 4-5 cans of osmolite. He had cut back to this amount because he had gained weight, but he has now lost weight and plans to increase his intake of osmolite to improve his weight.   Weight changes, if any: Wt Readings from Last 3 Encounters:  07/05/16 199 lb 3.2 oz (90.4 kg)  06/27/16 201 lb (91.2 kg)  05/16/16 209 lb (94.8 kg)   Swallowing issues, if any: He has been told that he will never be able to have his feeding tube removed. He is very upset about this. He went to [facility] for swallowing evaluations and was told that he is silently aspirating. He was told that he needed to thicken his liquid intake to nectar thick. He also tells me that he was told not to eat solid foods. He however is not following advice and drinking thin liquids.  Smoking or chewing tobacco? No Using fluoride trays daily? No Last ENT visit was on: Dr. Nicolette Bang in March and next appointment is in July Other notable issues, if any:  05/27/16 Transnasal Esophageal Endoscopy balloon and [place].  PET scan 07/04/16  BP 120/73   Pulse 83   Temp 98.5 F (36.9 C)   Ht 6' (1.829 m)   Wt 199 lb 3.2 oz (90.4 kg)   SpO2 96% Comment: room air  BMI 27.02 kg/m

## 2016-07-02 NOTE — Progress Notes (Deleted)
error 

## 2016-07-03 NOTE — Progress Notes (Signed)
Radiation Oncology         (336) [phone] ________________________________  Name: Randy Hanson MRN: [id number]  Date: 07/05/2016  DOB: 1940-12-19  Follow-Up Visit Note  CC: Henrine Screws, MD  Josetta Huddle, MD  Diagnosis and Prior Radiotherapy:       ICD-9-CM ICD-10-CM   1. 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 The Right tonsil and bilateral neck were treated PTV High to 70 Gy in 35 fractions at 2 Gy per fraction. 2. The Right tonsil and bilateral neck were treated PTV Med to 63 Gy in 35 fractions at 1.8 Gy per fraction. 3. The Right tonsil and bilateral neck were treated PTV Low to 56 Gy in 35 fractions at 1.6 Gy per fraction.  Chief Complaint: Follow up of tonsillar cancer  Narrative:  The patient returns today for routine follow-up of radiation completed 10/16/15 to his Right Tonsil and bilateral neck.   His most recent PET scan was 07/04/16.  No hypermetabolic lymph nodes in the neck. No signs of distant metastatic disease. Liver is unremarkable on PET scan. No hypermetabolic findings in the abdomen. I personally reviewed his images.  Dr. Marin Olp saw him earlier this month, and lab work at that time showed elevated alk phos of unclear etiology.  On review of systems, the patient denies pain. He has a PEG tube in place, and is currently instilling 4-5 cans of osmolite daily. He cut back previously due to weight gain, but has now lost weight (10 lbs in the last 6 weeks) and plans to increase osmolite intake again. The patient reports he has been told his PEG tube will never be removed, and he is very upset about this. He reported to [facility] for swallowing evaluations and was told he is silently aspirating; he was instructed to thicken his liquid intake. He also reports he was told not to eat solid foods, however he is not following this  advice and is eating and drinking what he wants.  The patient is not using tobacco products. He is not using daily fluoride trays. His next appointment with Dr. Nicolette Bang is in July.  ALLERGIES:  is allergic to lisinopril.  Meds: Current Outpatient Prescriptions  Medication Sig Dispense Refill  . Ascorbic Acid (VITAMIN C) 1000 MG tablet Take 1,000 mg by mouth daily.    . diphenhydrAMINE (BENADRYL) 25 mg capsule Take 25 mg by mouth at bedtime as needed.    . lidocaine-prilocaine (EMLA) cream Apply to affected area as needed 30 g 3  . MULTIPLE MINERALS PO Take by mouth.    . ranitidine (ZANTAC) 150 MG capsule Take 150 mg by mouth 2 (two) times daily.    Marland Kitchen terazosin (HYTRIN) 2 MG capsule Take 2 mg by mouth at bedtime.    Marland Kitchen HYDROcodone-acetaminophen (NORCO/VICODIN) 5-325 MG tablet     . LORazepam (ATIVAN) 0.5 MG tablet Take 1 tablet (0.5 mg total) by mouth every 6 (six) hours as needed (Nausea or vomiting). (Patient not taking: Reported on 07/05/2016) 30 tablet 0   No current facility-administered medications for this encounter.     Physical Findings: The patient is in no acute distress. Patient is alert and oriented. Wt Readings from Last 3 Encounters:  07/05/16 199 lb 3.2 oz (90.4 kg)  06/27/16 201 lb (91.2 kg)  05/16/16 209 lb (94.8 kg)    height  Radiation Oncology         (336) [phone] ________________________________  Name: Randy Hanson MRN: [id number]  Date: 07/05/2016  DOB: 1940-12-19  Follow-Up Visit Note  CC: Henrine Screws, MD  Josetta Huddle, MD  Diagnosis and Prior Radiotherapy:       ICD-9-CM ICD-10-CM   1. 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 The Right tonsil and bilateral neck were treated PTV High to 70 Gy in 35 fractions at 2 Gy per fraction. 2. The Right tonsil and bilateral neck were treated PTV Med to 63 Gy in 35 fractions at 1.8 Gy per fraction. 3. The Right tonsil and bilateral neck were treated PTV Low to 56 Gy in 35 fractions at 1.6 Gy per fraction.  Chief Complaint: Follow up of tonsillar cancer  Narrative:  The patient returns today for routine follow-up of radiation completed 10/16/15 to his Right Tonsil and bilateral neck.   His most recent PET scan was 07/04/16.  No hypermetabolic lymph nodes in the neck. No signs of distant metastatic disease. Liver is unremarkable on PET scan. No hypermetabolic findings in the abdomen. I personally reviewed his images.  Dr. Marin Olp saw him earlier this month, and lab work at that time showed elevated alk phos of unclear etiology.  On review of systems, the patient denies pain. He has a PEG tube in place, and is currently instilling 4-5 cans of osmolite daily. He cut back previously due to weight gain, but has now lost weight (10 lbs in the last 6 weeks) and plans to increase osmolite intake again. The patient reports he has been told his PEG tube will never be removed, and he is very upset about this. He reported to [facility] for swallowing evaluations and was told he is silently aspirating; he was instructed to thicken his liquid intake. He also reports he was told not to eat solid foods, however he is not following this  advice and is eating and drinking what he wants.  The patient is not using tobacco products. He is not using daily fluoride trays. His next appointment with Dr. Nicolette Bang is in July.  ALLERGIES:  is allergic to lisinopril.  Meds: Current Outpatient Prescriptions  Medication Sig Dispense Refill  . Ascorbic Acid (VITAMIN C) 1000 MG tablet Take 1,000 mg by mouth daily.    . diphenhydrAMINE (BENADRYL) 25 mg capsule Take 25 mg by mouth at bedtime as needed.    . lidocaine-prilocaine (EMLA) cream Apply to affected area as needed 30 g 3  . MULTIPLE MINERALS PO Take by mouth.    . ranitidine (ZANTAC) 150 MG capsule Take 150 mg by mouth 2 (two) times daily.    Marland Kitchen terazosin (HYTRIN) 2 MG capsule Take 2 mg by mouth at bedtime.    Marland Kitchen HYDROcodone-acetaminophen (NORCO/VICODIN) 5-325 MG tablet     . LORazepam (ATIVAN) 0.5 MG tablet Take 1 tablet (0.5 mg total) by mouth every 6 (six) hours as needed (Nausea or vomiting). (Patient not taking: Reported on 07/05/2016) 30 tablet 0   No current facility-administered medications for this encounter.     Physical Findings: The patient is in no acute distress. Patient is alert and oriented. Wt Readings from Last 3 Encounters:  07/05/16 199 lb 3.2 oz (90.4 kg)  06/27/16 201 lb (91.2 kg)  05/16/16 209 lb (94.8 kg)    height  Radiation Oncology         (336) [phone] ________________________________  Name: Randy Hanson MRN: [id number]  Date: 07/05/2016  DOB: 1940-12-19  Follow-Up Visit Note  CC: Henrine Screws, MD  Josetta Huddle, MD  Diagnosis and Prior Radiotherapy:       ICD-9-CM ICD-10-CM   1. 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 The Right tonsil and bilateral neck were treated PTV High to 70 Gy in 35 fractions at 2 Gy per fraction. 2. The Right tonsil and bilateral neck were treated PTV Med to 63 Gy in 35 fractions at 1.8 Gy per fraction. 3. The Right tonsil and bilateral neck were treated PTV Low to 56 Gy in 35 fractions at 1.6 Gy per fraction.  Chief Complaint: Follow up of tonsillar cancer  Narrative:  The patient returns today for routine follow-up of radiation completed 10/16/15 to his Right Tonsil and bilateral neck.   His most recent PET scan was 07/04/16.  No hypermetabolic lymph nodes in the neck. No signs of distant metastatic disease. Liver is unremarkable on PET scan. No hypermetabolic findings in the abdomen. I personally reviewed his images.  Dr. Marin Olp saw him earlier this month, and lab work at that time showed elevated alk phos of unclear etiology.  On review of systems, the patient denies pain. He has a PEG tube in place, and is currently instilling 4-5 cans of osmolite daily. He cut back previously due to weight gain, but has now lost weight (10 lbs in the last 6 weeks) and plans to increase osmolite intake again. The patient reports he has been told his PEG tube will never be removed, and he is very upset about this. He reported to [facility] for swallowing evaluations and was told he is silently aspirating; he was instructed to thicken his liquid intake. He also reports he was told not to eat solid foods, however he is not following this  advice and is eating and drinking what he wants.  The patient is not using tobacco products. He is not using daily fluoride trays. His next appointment with Dr. Nicolette Bang is in July.  ALLERGIES:  is allergic to lisinopril.  Meds: Current Outpatient Prescriptions  Medication Sig Dispense Refill  . Ascorbic Acid (VITAMIN C) 1000 MG tablet Take 1,000 mg by mouth daily.    . diphenhydrAMINE (BENADRYL) 25 mg capsule Take 25 mg by mouth at bedtime as needed.    . lidocaine-prilocaine (EMLA) cream Apply to affected area as needed 30 g 3  . MULTIPLE MINERALS PO Take by mouth.    . ranitidine (ZANTAC) 150 MG capsule Take 150 mg by mouth 2 (two) times daily.    Marland Kitchen terazosin (HYTRIN) 2 MG capsule Take 2 mg by mouth at bedtime.    Marland Kitchen HYDROcodone-acetaminophen (NORCO/VICODIN) 5-325 MG tablet     . LORazepam (ATIVAN) 0.5 MG tablet Take 1 tablet (0.5 mg total) by mouth every 6 (six) hours as needed (Nausea or vomiting). (Patient not taking: Reported on 07/05/2016) 30 tablet 0   No current facility-administered medications for this encounter.     Physical Findings: The patient is in no acute distress. Patient is alert and oriented. Wt Readings from Last 3 Encounters:  07/05/16 199 lb 3.2 oz (90.4 kg)  06/27/16 201 lb (91.2 kg)  05/16/16 209 lb (94.8 kg)    height

## 2016-07-04 ENCOUNTER — Encounter (HOSPITAL_COMMUNITY)
Admission: RE | Admit: 2016-07-04 | Discharge: 2016-07-04 | Disposition: A | Discharge status: Home / Self Care | Payer: Medicare Other | Source: Ambulatory Visit | Attending: Radiation Oncology | Admitting: Radiation Oncology

## 2016-07-04 ENCOUNTER — Other Ambulatory Visit (HOSPITAL_BASED_OUTPATIENT_CLINIC_OR_DEPARTMENT_OTHER): Payer: Medicare Other

## 2016-07-04 DIAGNOSIS — C099 Malignant neoplasm of tonsil, unspecified: Secondary | ICD-10-CM | POA: Diagnosis not present

## 2016-07-04 DIAGNOSIS — C090 Malignant neoplasm of tonsillar fossa: Secondary | ICD-10-CM

## 2016-07-04 DIAGNOSIS — R7989 Other specified abnormal findings of blood chemistry: Secondary | ICD-10-CM

## 2016-07-04 DIAGNOSIS — R945 Abnormal results of liver function studies: Secondary | ICD-10-CM

## 2016-07-04 DIAGNOSIS — C770 Secondary and unspecified malignant neoplasm of lymph nodes of head, face and neck: Secondary | ICD-10-CM | POA: Diagnosis not present

## 2016-07-04 LAB — CBC WITH DIFFERENTIAL/PLATELET
BASO%: 0.6 % (ref 0.0–2.0)
Basophils Absolute: 0 10*3/uL (ref 0.0–0.1)
EOS ABS: 0.1 10*3/uL (ref 0.0–0.5)
EOS%: 1.6 % (ref 0.0–7.0)
HEMATOCRIT: 42 % (ref 38.4–49.9)
HGB: 14.4 g/dL (ref 13.0–17.1)
LYMPH#: 0.6 10*3/uL — AB (ref 0.9–3.3)
LYMPH%: 11.3 % — AB (ref 14.0–49.0)
MCH: 31.8 pg (ref 27.2–33.4)
MCHC: 34.3 g/dL (ref 32.0–36.0)
MCV: 92.8 fL (ref 79.3–98.0)
MONO#: 0.4 10*3/uL (ref 0.1–0.9)
MONO%: 8.7 % (ref 0.0–14.0)
NEUT%: 77.8 % — AB (ref 39.0–75.0)
NEUTROS ABS: 3.8 10*3/uL (ref 1.5–6.5)
Platelets: 306 10*3/uL (ref 140–400)
RBC: 4.52 10*6/uL (ref 4.20–5.82)
RDW: 13.2 % (ref 11.0–14.6)
WBC: 4.9 10*3/uL (ref 4.0–10.3)

## 2016-07-04 LAB — COMPREHENSIVE METABOLIC PANEL
ALT: 55 U/L (ref 0–55)
ANION GAP: 10 meq/L (ref 3–11)
AST: 28 U/L (ref 5–34)
Albumin: 4 g/dL (ref 3.5–5.0)
Alkaline Phosphatase: 255 U/L — ABNORMAL HIGH (ref 40–150)
BUN: 22.1 mg/dL (ref 7.0–26.0)
CO2: 27 meq/L (ref 22–29)
CREATININE: 1 mg/dL (ref 0.7–1.3)
Calcium: 10.6 mg/dL — ABNORMAL HIGH (ref 8.4–10.4)
Chloride: 103 mEq/L (ref 98–109)
EGFR: 72 mL/min/{1.73_m2} — ABNORMAL LOW (ref >90)
Glucose: 105 mg/dl (ref 70–140)
Potassium: 4.7 mEq/L (ref 3.5–5.1)
Sodium: 140 mEq/L (ref 136–145)
Total Bilirubin: 0.8 mg/dL (ref 0.20–1.20)
Total Protein: 7.5 g/dL (ref 6.4–8.3)

## 2016-07-04 LAB — GLUCOSE, CAPILLARY: Glucose-Capillary: 110 mg/dL — ABNORMAL HIGH (ref 65–99)

## 2016-07-04 LAB — LACTATE DEHYDROGENASE: LDH: 150 U/L (ref 125–245)

## 2016-07-04 MED ORDER — FLUDEOXYGLUCOSE F - 18 (FDG) INJECTION
9.7200 | Freq: Once | INTRAVENOUS | Status: AC | PRN
  Administered 2016-07-04: 9.72 via INTRAVENOUS

## 2016-07-05 ENCOUNTER — Ambulatory Visit
Admission: RE | Admit: 2016-07-05 | Discharge: 2016-07-05 | Disposition: A | Discharge status: Home / Self Care | Payer: Medicare Other | Source: Ambulatory Visit | Attending: Radiation Oncology | Admitting: Radiation Oncology

## 2016-07-05 ENCOUNTER — Ambulatory Visit (HOSPITAL_COMMUNITY)
Admission: RE | Admit: 2016-07-05 | Discharge: 2016-07-05 | Disposition: A | Discharge status: Home / Self Care | Payer: Non-veteran care | Source: Ambulatory Visit | Attending: Radiation Oncology | Admitting: Radiation Oncology

## 2016-07-05 ENCOUNTER — Other Ambulatory Visit: Payer: Self-pay | Admitting: Radiation Oncology

## 2016-07-05 ENCOUNTER — Encounter: Payer: Self-pay | Admitting: Radiation Oncology

## 2016-07-05 VITALS — BP 120/73 | HR 83 | Temp 98.5°F | Ht 72.0 in | Wt 199.2 lb

## 2016-07-05 DIAGNOSIS — C090 Malignant neoplasm of tonsillar fossa: Secondary | ICD-10-CM | POA: Diagnosis not present

## 2016-07-05 HISTORY — PX: IR PATIENT EVAL TECH 0-60 MINS: IMG5564

## 2016-07-05 NOTE — Procedures (Signed)
Patient came in with complaint of broken pull thru g tube hub.  The catheter was cut and a new hub attached. The patient was advised to call us with any further concerns.

## 2016-07-07 ENCOUNTER — Other Ambulatory Visit: Payer: Self-pay | Admitting: Radiation Oncology

## 2016-07-07 DIAGNOSIS — C090 Malignant neoplasm of tonsillar fossa: Secondary | ICD-10-CM

## 2016-07-08 ENCOUNTER — Other Ambulatory Visit (HOSPITAL_COMMUNITY): Payer: No Typology Code available for payment source

## 2016-07-19 ENCOUNTER — Ambulatory Visit (HOSPITAL_BASED_OUTPATIENT_CLINIC_OR_DEPARTMENT_OTHER): Payer: Medicare Other | Admitting: Hematology & Oncology

## 2016-07-19 ENCOUNTER — Other Ambulatory Visit: Payer: Self-pay | Admitting: Radiology

## 2016-07-19 ENCOUNTER — Other Ambulatory Visit: Payer: Self-pay | Admitting: Radiation Oncology

## 2016-07-19 ENCOUNTER — Other Ambulatory Visit (HOSPITAL_BASED_OUTPATIENT_CLINIC_OR_DEPARTMENT_OTHER): Payer: Medicare Other

## 2016-07-19 VITALS — BP 109/61 | HR 63 | Temp 98.4°F | Resp 16 | Wt 199.0 lb

## 2016-07-19 DIAGNOSIS — C099 Malignant neoplasm of tonsil, unspecified: Secondary | ICD-10-CM

## 2016-07-19 DIAGNOSIS — R945 Abnormal results of liver function studies: Secondary | ICD-10-CM

## 2016-07-19 DIAGNOSIS — R1310 Dysphagia, unspecified: Secondary | ICD-10-CM

## 2016-07-19 DIAGNOSIS — R7989 Other specified abnormal findings of blood chemistry: Secondary | ICD-10-CM

## 2016-07-19 LAB — CMP (CANCER CENTER ONLY)
ALBUMIN: 3.4 g/dL (ref 3.3–5.5)
ALK PHOS: 113 U/L — AB (ref 26–84)
ALT: 23 U/L (ref 10–47)
AST: 26 U/L (ref 11–38)
BILIRUBIN TOTAL: 0.6 mg/dL (ref 0.20–1.60)
BUN, Bld: 21 mg/dL (ref 7–22)
CALCIUM: 10 mg/dL (ref 8.0–10.3)
CO2: 30 mEq/L (ref 18–33)
Chloride: 102 mEq/L (ref 98–108)
Creat: 0.9 mg/dl (ref 0.6–1.2)
Glucose, Bld: 118 mg/dL (ref 73–118)
Potassium: 4.6 mEq/L (ref 3.3–4.7)
Sodium: 139 mEq/L (ref 128–145)
Total Protein: 6.9 g/dL (ref 6.4–8.1)

## 2016-07-19 LAB — CBC WITH DIFFERENTIAL (CANCER CENTER ONLY)
BASO#: 0 10*3/uL (ref 0.0–0.2)
BASO%: 0.3 % (ref 0.0–2.0)
EOS%: 3.5 % (ref 0.0–7.0)
Eosinophils Absolute: 0.1 10*3/uL (ref 0.0–0.5)
HEMATOCRIT: 38.7 % (ref 38.7–49.9)
HEMOGLOBIN: 13.1 g/dL (ref 13.0–17.1)
LYMPH#: 0.5 10*3/uL — AB (ref 0.9–3.3)
LYMPH%: 13.4 % — ABNORMAL LOW (ref 14.0–48.0)
MCH: 32.3 pg (ref 28.0–33.4)
MCHC: 33.9 g/dL (ref 32.0–35.9)
MCV: 96 fL (ref 82–98)
MONO#: 0.6 10*3/uL (ref 0.1–0.9)
MONO%: 14.8 % — ABNORMAL HIGH (ref 0.0–13.0)
NEUT%: 68 % (ref 40.0–80.0)
NEUTROS ABS: 2.5 10*3/uL (ref 1.5–6.5)
Platelets: 172 10*3/uL (ref 145–400)
RBC: 4.05 10*6/uL — ABNORMAL LOW (ref 4.20–5.70)
RDW: 12.5 % (ref 11.1–15.7)
WBC: 3.7 10*3/uL — ABNORMAL LOW (ref 4.0–10.0)

## 2016-07-19 NOTE — Progress Notes (Signed)
Hematology and Oncology Follow Up Visit  Randy Hanson [id number] [birth year] 76 y.o. 07/19/2016   Principle Diagnosis:  Stage I (T1N1M0) - HPV + - Squamous cell ca of right tonsil  Current Therapy:   Cisplatin s/p cycle #7 - completed Radiation therapy s/p  - completed 10/16/2015    Interim History:  Randy Hanson is here today for a follow-up.  He still not able to swallow. However, he says that he does drink water. He has 2 bottles of water a day. He has no problems with drinking water. He does not cough. He does not get short of breath. He does not choke. As such, I would like to think that he would be able to have something a little bit more substantial.  He apparently is going to be set up with a specialist at [facility] to see about his swallowing function.  We last saw his liver function tests were quite high. He been taking a lot of Tylenol. He is not taking Tylenol at all. His liver tests have gotten better.  He's not had any fever. He's had no bleeding.. There's been no nausea or vomiting. He's had a little bit of constipation. He's had some slight leg swelling.   Overall, his performance status is ECOG 1.     Medications:  Allergies as of 07/19/2016      Reactions   Lisinopril Cough      Medication List       Accurate as of 07/19/16  3:16 PM. Always use your most recent med list.          diphenhydrAMINE 25 mg capsule Commonly known as:  BENADRYL Take 25 mg by mouth at bedtime as needed.   HYDROcodone-acetaminophen 5-325 MG tablet Commonly known as:  NORCO/VICODIN   lidocaine-prilocaine cream Commonly known as:  EMLA Apply to affected area as needed   LORazepam 0.5 MG tablet Commonly known as:  ATIVAN Take 1 tablet (0.5 mg total) by mouth every 6 (six) hours as needed (Nausea or vomiting).   MULTIPLE MINERALS PO Take by mouth.   ranitidine 150 MG capsule Commonly known as:  ZANTAC Take 150 mg by mouth 2 (two) times daily.   terazosin 2 MG  capsule Commonly known as:  HYTRIN Take 2 mg by mouth at bedtime.   vitamin C 1000 MG tablet Take 1,000 mg by mouth daily.       Allergies:  Allergies  Allergen Reactions  . Lisinopril Cough    Past Medical History, Surgical history, Social history, and Family History were reviewed and updated.  Review of Systems: All other 10 point review of systems is negative.   Physical Exam:  weight is 199 lb (90.3 kg). His oral temperature is 98.4 F (36.9 C). His blood pressure is 109/61 and his pulse is 63. His respiration is 16 and oxygen saturation is 97%.   Wt Readings from Last 3 Encounters:  07/19/16 199 lb (90.3 kg)  07/05/16 199 lb 3.2 oz (90.4 kg)  06/27/16 201 lb (91.2 kg)    Head and neck exam shows no ocular lesions. He does have minimal erythema and mucositis in the soft palate. His oral mucosa is somewhat dry. He has erythema on his neck. There is no radiation dermatitis breakdown on his neck.No obvious adenopathy is noted on the neck. Thyroid is nonpalpable. Lungs are clear. Cardiac exam regular rate and rhythm with no murmurs, rubs or bruits. Abdomen is soft. His feeding tube is in the upper left quadrant.  This is intact. He has decent bowel sounds. He has some slight tenderness at the G-tube site. There is no palpable liver or spleen tip. Back exam shows no tenderness over the spine, ribs or hips. Extremities shows no clubbing, cyanosis or edema. Skin exam is slightly dry. Neurological exam shows no focal neurological deficits.   Lab Results  Component Value Date   WBC 3.7 (L) 07/19/2016   HGB 13.1 07/19/2016   HCT 38.7 07/19/2016   MCV 96 07/19/2016   PLT 172 07/19/2016   No results found for: FERRITIN, IRON, TIBC, UIBC, IRONPCTSAT Lab Results  Component Value Date   RBC 4.05 (L) 07/19/2016   No results found for: KPAFRELGTCHN, LAMBDASER, KAPLAMBRATIO No results found for: IGGSERUM, IGA, IGMSERUM No results found for: Marda Stalker, SPEI   Chemistry      Component Value Date/Time   NA 139 07/19/2016 1354   NA 140 07/04/2016 1014   K 4.6 07/19/2016 1354   K 4.7 07/04/2016 1014   CL 102 07/19/2016 1354   CO2 30 07/19/2016 1354   CO2 27 07/04/2016 1014   BUN 21 07/19/2016 1354   BUN 22.1 07/04/2016 1014   CREATININE 0.9 07/19/2016 1354   CREATININE 1.0 07/04/2016 1014      Component Value Date/Time   CALCIUM 10.0 07/19/2016 1354   CALCIUM 10.6 (H) 07/04/2016 1014   ALKPHOS 113 (H) 07/19/2016 1354   ALKPHOS 255 (H) 07/04/2016 1014   AST 26 07/19/2016 1354   AST 28 07/04/2016 1014   ALT 23 07/19/2016 1354   ALT 55 07/04/2016 1014   BILITOT 0.60 07/19/2016 1354   BILITOT 0.80 07/04/2016 1014     Impression and Plan: Randy Hanson is a very pleasant 29- yo white male with stage I  HPV+ - squamous cell carcinoma the right tonsil. He completed radiation and chemotherapy in early August. By his PET scan in April, he is in remission.  I am just surprised that he is not able to drink or eat. I really think that he will be able to. Hopefully, getting the second opinion and evaluation will help.  His liver tests are normal now. The alkaline phosphatase is up a little bit which probably is from his tube feeds.  I think we've probably get him back in 3 months now. I think this would be reasonable for follow-up.   [name], MD 5/11/20183:16 PM

## 2016-07-22 ENCOUNTER — Ambulatory Visit (HOSPITAL_COMMUNITY)
Admission: RE | Admit: 2016-07-22 | Discharge: 2016-07-22 | Disposition: A | Discharge status: Home / Self Care | Payer: Medicare Other | Source: Ambulatory Visit | Attending: Radiation Oncology | Admitting: Radiation Oncology

## 2016-07-22 ENCOUNTER — Encounter (HOSPITAL_COMMUNITY): Payer: Self-pay

## 2016-07-22 DIAGNOSIS — Z85818 Personal history of malignant neoplasm of other sites of lip, oral cavity, and pharynx: Secondary | ICD-10-CM | POA: Insufficient documentation

## 2016-07-22 DIAGNOSIS — K219 Gastro-esophageal reflux disease without esophagitis: Secondary | ICD-10-CM | POA: Diagnosis not present

## 2016-07-22 DIAGNOSIS — Z452 Encounter for adjustment and management of vascular access device: Secondary | ICD-10-CM | POA: Insufficient documentation

## 2016-07-22 DIAGNOSIS — Z923 Personal history of irradiation: Secondary | ICD-10-CM | POA: Insufficient documentation

## 2016-07-22 DIAGNOSIS — N400 Enlarged prostate without lower urinary tract symptoms: Secondary | ICD-10-CM | POA: Insufficient documentation

## 2016-07-22 DIAGNOSIS — M1990 Unspecified osteoarthritis, unspecified site: Secondary | ICD-10-CM | POA: Insufficient documentation

## 2016-07-22 DIAGNOSIS — I10 Essential (primary) hypertension: Secondary | ICD-10-CM | POA: Insufficient documentation

## 2016-07-22 DIAGNOSIS — E7800 Pure hypercholesterolemia, unspecified: Secondary | ICD-10-CM | POA: Insufficient documentation

## 2016-07-22 DIAGNOSIS — Z87891 Personal history of nicotine dependence: Secondary | ICD-10-CM | POA: Diagnosis not present

## 2016-07-22 DIAGNOSIS — C090 Malignant neoplasm of tonsillar fossa: Secondary | ICD-10-CM

## 2016-07-22 HISTORY — PX: IR REMOVAL TUN ACCESS W/ PORT W/O FL MOD SED: IMG2290

## 2016-07-22 LAB — CBC WITH DIFFERENTIAL/PLATELET
BASOS ABS: 0 10*3/uL (ref 0.0–0.1)
Basophils Relative: 1 %
Eosinophils Absolute: 0.2 10*3/uL (ref 0.0–0.7)
Eosinophils Relative: 4 %
HEMATOCRIT: 39.6 % (ref 39.0–52.0)
Hemoglobin: 13.6 g/dL (ref 13.0–17.0)
LYMPHS ABS: 0.5 10*3/uL — AB (ref 0.7–4.0)
LYMPHS PCT: 12 %
MCH: 31.9 pg (ref 26.0–34.0)
MCHC: 34.3 g/dL (ref 30.0–36.0)
MCV: 93 fL (ref 78.0–100.0)
MONO ABS: 0.6 10*3/uL (ref 0.1–1.0)
Monocytes Relative: 13 %
Neutro Abs: 3 10*3/uL (ref 1.7–7.7)
Neutrophils Relative %: 70 %
Platelets: 188 10*3/uL (ref 150–400)
RBC: 4.26 MIL/uL (ref 4.22–5.81)
RDW: 12.9 % (ref 11.5–15.5)
WBC: 4.2 10*3/uL (ref 4.0–10.5)

## 2016-07-22 LAB — PROTIME-INR
INR: 1.01
Prothrombin Time: 13.3 seconds (ref 11.4–15.2)

## 2016-07-22 MED ORDER — CEFAZOLIN SODIUM-DEXTROSE 2-4 GM/100ML-% IV SOLN
INTRAVENOUS | Status: AC
  Administered 2016-07-22: 2 g via INTRAVENOUS
  Filled 2016-07-22: qty 100

## 2016-07-22 MED ORDER — LIDOCAINE HCL 1 % IJ SOLN
INTRAMUSCULAR | Status: AC
  Filled 2016-07-22: qty 20

## 2016-07-22 MED ORDER — CEFAZOLIN SODIUM-DEXTROSE 2-4 GM/100ML-% IV SOLN
2.0000 g | INTRAVENOUS | Status: AC
  Administered 2016-07-22: 2 g via INTRAVENOUS

## 2016-07-22 MED ORDER — MIDAZOLAM HCL 2 MG/2ML IJ SOLN
INTRAMUSCULAR | Status: AC | PRN
  Administered 2016-07-22 (×4): 1 mg via INTRAVENOUS

## 2016-07-22 MED ORDER — SODIUM CHLORIDE 0.9 % IV SOLN
INTRAVENOUS | Status: DC
  Administered 2016-07-22: 13:00:00 via INTRAVENOUS

## 2016-07-22 MED ORDER — MIDAZOLAM HCL 2 MG/2ML IJ SOLN
INTRAMUSCULAR | Status: AC
  Filled 2016-07-22: qty 4

## 2016-07-22 MED ORDER — FENTANYL CITRATE (PF) 100 MCG/2ML IJ SOLN
INTRAMUSCULAR | Status: AC | PRN
  Administered 2016-07-22: 25 ug via INTRAVENOUS
  Administered 2016-07-22: 50 ug via INTRAVENOUS

## 2016-07-22 MED ORDER — FENTANYL CITRATE (PF) 100 MCG/2ML IJ SOLN
INTRAMUSCULAR | Status: AC
  Filled 2016-07-22: qty 2

## 2016-07-22 NOTE — Procedures (Signed)
Interventional Radiology Procedure Note  Procedure: Port removal  Complications: None  Estimated Blood Loss: < 10 mL  Left chest port removed in entirety.  No complications.  Incision closed.  Venetia Night. Kathlene Cote, M.D Pager:  [phone]

## 2016-07-22 NOTE — H&P (Signed)
Chief Complaint: Patient was seen in consultation today for port removal at the request of [place]  Referring Physician(s): Eppie Gibson  Supervising Physician: Aletta Edouard  Patient Status: [facility] - Out-pt  History of Present Illness: Randy Hanson is a 76 y.o. male with tonsillar cancer. He has completed therapy and is referred for port removal. Port was originally placed 08/24/2015. He rrports no issues with it PMHx, meds, labs, allergies reviewed. Has been NPO since before 0800 today Feels well otherwise. Wife at bedside  Past Medical History:  Diagnosis Date  . Back pain   . BPH (benign prostatic hyperplasia)   . Cancer of tonsil, palatine ([place]) 07/12/2015  . DJD (degenerative joint disease)   . Esophageal reflux   . FHx: colonic polyps   . Hemorrhoids   . History of radiation therapy 08/28/15-- 10/16/15   Right Tonsil and bilateral neck  . Hypercholesteremia   . Hyperlipemia   . Hypertension   . Kidney cysts    [facility] [place] [facility]  . Posterior vitreous detachment, left eye    DR. KATHERINE HECKER  AUGUST 2011  . Radiation 08/28/15- 10/16/15   Right Tonsil and Bilateral Neck    Past Surgical History:  Procedure Laterality Date  . CARDIAC CATHETERIZATION     remote >15 years ago  . IR GENERIC HISTORICAL  10/11/2015   IR PATIENT EVAL TECH 0-60 MINS 10/11/2015 Darrell K Allred, PA-C [facility]-INTERV RAD  . IR PATIENT EVAL TECH 0-60 MINS  07/05/2016  . VASECTOMY  1975    Allergies: Lisinopril  Medications: Prior to Admission medications   Medication Sig Start Date End Date Taking? Authorizing Provider  Ascorbic Acid (VITAMIN C) 1000 MG tablet Take 1,000 mg by mouth daily.   Yes [provider]  diphenhydrAMINE (BENADRYL) 25 mg capsule Take 25 mg by mouth at bedtime as needed.   Yes [provider]  MULTIPLE MINERALS PO Take by mouth.   Yes [provider]  ranitidine (ZANTAC) 150 MG capsule Take 150 mg by mouth 2  (two) times daily.   Yes [provider]  terazosin (HYTRIN) 2 MG capsule Take 2 mg by mouth at bedtime.   Yes [provider]  HYDROcodone-acetaminophen (NORCO/VICODIN) 5-325 MG tablet  09/13/15   [provider]  lidocaine-prilocaine (EMLA) cream Apply to affected area as needed 08/23/15   Ennever, Rudell Cobb, MD  LORazepam (ATIVAN) 0.5 MG tablet Take 1 tablet (0.5 mg total) by mouth every 6 (six) hours as needed (Nausea or vomiting). Patient not taking: Reported on 07/05/2016 08/23/15   Volanda Napoleon, MD     Family History  Problem Relation Age of Onset  . Hypertension Mother     Social History   Social History  . Marital status: Married    Spouse name: N/A  . Number of children: N/A  . Years of education: N/A   Social History Main Topics  . Smoking status: Former Research scientist (life sciences)  . Smokeless tobacco: Never Used     Comment: quit 35 years ago  . Alcohol use No  . Drug use: No  . Sexual activity: Not Asked   Other Topics Concern  . None   Social History Narrative   TOBACCO USE CIGARETTES: NEVER SMOKED.NO SMOKING.NO ALCOHOL .CAFFEINE YES:NO  RECREATIONAL DRUGS. OCCUPATION :RETIRED   MARTIAL STATUS : MARRIED      Review of Systems: A 12 point ROS discussed and pertinent positives are indicated in the HPI above.  All other  systems are negative.  Review of Systems  Vital Signs: BP 120/70   Pulse 69   Temp 98.7 F (37.1 C) (Oral)   Resp 16   Ht 6' (1.829 m)   Wt 196 lb (88.9 kg)   SpO2 98%   BMI 26.58 kg/m   Physical Exam  Constitutional: He is oriented to person, place, and time. He appears well-developed and well-nourished. No distress.  HENT:  Head: Normocephalic.  Mouth/Throat: Oropharynx is clear and moist.  Neck: Normal range of motion. No JVD present. No tracheal deviation present.  Cardiovascular: Normal rate, regular rhythm and normal heart sounds.   Pulmonary/Chest: Effort normal and breath sounds normal.  Neurological: He is alert and  oriented to person, place, and time.  (L)chest port palpable  Skin: Skin is warm and dry.  Psychiatric: He has a normal mood and affect.    Mallampati Score:  MD Evaluation Airway: WNL Heart: WNL Abdomen: WNL Chest/ Lungs: WNL ASA  Classification: 3 Mallampati/Airway Score: Two  Imaging:   Nm Pet Image Restag (ps) Skull Base To Thigh  Result Date: 07/04/2016 CLINICAL DATA:  Subsequent treatment strategy for head neck cancer. Tonsil carcinoma. EXAM: NUCLEAR MEDICINE PET SKULL BASE TO THIGH TECHNIQUE: 9.7 mCi F-18 FDG was injected intravenously. Full-ring PET imaging was performed from the skull base to thigh after the radiotracer. CT data was obtained and used for attenuation correction and anatomic localization. FASTING BLOOD GLUCOSE:  Value: 110 mg/dl COMPARISON:  PET-CT 02/08/2016 FINDINGS: NECK No hypermetabolic lymph nodes in the neck. CHEST No hypermetabolic mediastinal or hilar nodes. No suspicious pulmonary nodules on the CT scan. Port in LEFT chest wall. ABDOMEN/PELVIS No abnormal hypermetabolic activity within the liver, pancreas, adrenal glands, or spleen. No hypermetabolic lymph nodes in the abdomen or pelvis. Percutaneous gastrostomy to noted SKELETON No focal metabolic activity skeleton to suggest metastasis IMPRESSION: 1. No evidence of local recurrence within the neck. 2. No evidence distant metastatic disease. Electronically Signed   By: Suzy Bouchard M.D.   On: 07/04/2016 16:05     Labs:  CBC:  Recent Labs  06/27/16 1434 07/04/16 1014 07/19/16 1354 07/22/16 1246  WBC 5.1 4.9 3.7* 4.2  HGB 13.2 14.4 13.1 13.6  HCT 39.1 42.0 38.7 39.6  PLT 219 306 172 188    COAGS:  Recent Labs  08/24/15 0859 09/26/15 1415 07/22/16 1246  INR 1.09 1.04 1.01  APTT 28 22*  --     BMP:  Recent Labs  08/18/15 1503  10/18/15 1023  04/03/16 0859 05/16/16 1101 06/27/16 1434 07/04/16 1014 07/19/16 1354  NA 136  < > 132  < > 141 140 140 140 139  K 3.5  < > 4.3  <  > 4.0 4.3 3.7 4.7 4.6  CL 100  < > 98  --  105  --  102  --  102  CO2 24  < > 29  < > 28 28 30 27 30   GLUCOSE 176*  < > 162*  < > 133* 121 145* 105 118  BUN 19  < > 24*  < > 13 15.0 21 22.1 21  CALCIUM 9.7  < > 9.3  < > 9.8 10.0 9.7 10.6* 10.0  CREATININE 0.97  < > 0.8  < > 0.9 0.9 1.1 1.0 0.9  GFRNONAA 78  --   --   --   --   --   --   --   --   GFRAA 90  --   --   --   --   --   --   --   --   < > =  values in this interval not displayed.  LIVER FUNCTION TESTS:  Recent Labs  05/16/16 1101 06/27/16 1434 07/04/16 1014 07/19/16 1354  BILITOT 0.37 0.90 0.80 0.60  AST 20 91* 28 26  ALT 10 175* 55 23  ALKPHOS 64 381* 255* 113*  PROT 6.7 6.8 7.5 6.9  ALBUMIN 4.0 3.1* 4.0 3.4    TUMOR MARKERS: No results for input(s): AFPTM, CEA, CA199, CHROMGRNA in the last 8760 hours.  Assessment and Plan: Tonsillar cancer Chemotherapy complete Plan for port removal today Risks and Benefits discussed with the patient including, but not limited to bleeding, infection. All of the patient's questions were answered, patient is agreeable to proceed. Consent signed and in chart.   Thank you for this interesting consult.  I greatly enjoyed meeting Kahner Yanik [facility] and look forward to participating in their care.  A copy of this report was sent to the requesting provider on this date.  Electronically Signed: Ascencion Dike 07/22/2016, 2:23 PM   I spent a total of 20 minutes in face to face in clinical consultation, greater than 50% of which was counseling/coordinating care for port removal

## 2016-07-22 NOTE — Discharge Instructions (Signed)
Implanted Port Removal, Care After °Refer to this sheet in the next few weeks. These instructions provide you with information about caring for yourself after your procedure. Your health care provider may also give you more specific instructions. Your treatment has been planned according to current medical practices, but problems sometimes occur. Call your health care provider if you have any problems or questions after your procedure. °What can I expect after the procedure? °After the procedure, it is common to have: °· Soreness or pain near your incision. °· Some swelling or bruising near your incision. °Follow these instructions at home: °Medicines  °· Take over-the-counter and prescription medicines only as told by your health care provider. °· If you were prescribed an antibiotic medicine, take it as told by your health care provider. Do not stop taking the antibiotic even if you start to feel better. °Bathing  °· Do not take baths, swim, or use a hot tub until your health care provider approves. Ask your health care provider if you can take showers. You may only be allowed to take sponge baths for bathing. °Incision care  °· Follow instructions from your health care provider about how to take care of your incision. Make sure you: °¨ Wash your hands with soap and water before you change your bandage (dressing). If soap and water are not available, use hand sanitizer. °¨ Change your dressing as told by your health care provider. °¨ Keep your dressing dry. °¨ Leave stitches (sutures), skin glue, or adhesive strips in place. These skin closures may need to stay in place for 2 weeks or longer. If adhesive strip edges start to loosen and curl up, you may trim the loose edges. Do not remove adhesive strips completely unless your health care provider tells you to do that. °· Check your incision area every day for signs of infection. Check for: °¨ More redness, swelling, or pain. °¨ More fluid or  blood. °¨ Warmth. °¨ Pus or a bad smell. °Driving  °· If you received a sedative, do not drive for 24 hours after the procedure. °· If you did not receive a sedative, ask your health care provider when it is safe to drive. °Activity  °· Return to your normal activities as told by your health care provider. Ask your health care provider what activities are safe for you. °· Until your health care provider says it is safe: °¨ Do not lift anything that is heavier than 10 lb (4.5 kg). °¨ Do not do activities that involve lifting your arms over your head. °General instructions  °· Do not use any tobacco products, such as cigarettes, chewing tobacco, and e-cigarettes. Tobacco can delay healing. If you need help quitting, ask your health care provider. °· Keep all follow-up visits as told by your health care provider. This is important. °Contact a health care provider if: °· You have more redness, swelling, or pain around your incision. °· You have more fluid or blood coming from your incision. °· Your incision feels warm to the touch. °· You have pus or a bad smell coming from your incision. °· You have a fever. °· You have pain that is not relieved by your pain medicine. °Get help right away if: °· You have chest pain. °· You have difficulty breathing. °This information is not intended to replace advice given to you by your health care provider. Make sure you discuss any questions you have with your health care provider. °Document Released: 02/06/2015 Document Revised: 08/03/2015 Document   Reviewed: 11/30/2014 Elsevier Interactive Patient Education  2017 [place].    Moderate Conscious Sedation, Adult, Care After These instructions provide you with information about caring for yourself after your procedure. Your health care provider may also give you more specific instructions. Your treatment has been planned according to current medical practices, but problems sometimes occur. Call your health care provider if  you have any problems or questions after your procedure. What can I expect after the procedure? After your procedure, it is common:  To feel sleepy for several hours.  To feel clumsy and have poor balance for several hours.  To have poor judgment for several hours.  To vomit if you eat too soon. Follow these instructions at home: For at least 24 hours after the procedure:    Do not:  Participate in activities where you could fall or become injured.  Drive.  Use heavy machinery.  Drink alcohol.  Take sleeping pills or medicines that cause drowsiness.  Make important decisions or sign legal documents.  Take care of children on your own.  Rest. Eating and drinking   Follow the diet recommended by your health care provider.  If you vomit:  Drink water, juice, or soup when you can drink without vomiting.  Make sure you have little or no nausea before eating solid foods. General instructions   Have a responsible adult stay with you until you are awake and alert.  Take over-the-counter and prescription medicines only as told by your health care provider.  If you smoke, do not smoke without supervision.  Keep all follow-up visits as told by your health care provider. This is important. Contact a health care provider if:  You keep feeling nauseous or you keep vomiting.  You feel light-headed.  You develop a rash.  You have a fever. Get help right away if:  You have trouble breathing. This information is not intended to replace advice given to you by your health care provider. Make sure you discuss any questions you have with your health care provider. Document Released: 12/16/2012 Document Revised: 07/31/2015 Document Reviewed: 06/17/2015 Elsevier Interactive Patient Education  2017 [employer].

## 2016-08-01 ENCOUNTER — Telehealth: Payer: Self-pay | Admitting: *Deleted

## 2016-08-01 NOTE — Telephone Encounter (Signed)
PT. TO HAVE SLP APPT. @ [facility] ON 08-06-16, PT. AWARE OF APPT.

## 2016-08-01 NOTE — Telephone Encounter (Signed)
Called patient to ask question, lvm for a return call 

## 2016-08-06 DIAGNOSIS — R1310 Dysphagia, unspecified: Secondary | ICD-10-CM | POA: Diagnosis not present

## 2016-08-06 DIAGNOSIS — Z8579 Personal history of other malignant neoplasms of lymphoid, hematopoietic and related tissues: Secondary | ICD-10-CM | POA: Diagnosis not present

## 2016-08-06 DIAGNOSIS — Z9221 Personal history of antineoplastic chemotherapy: Secondary | ICD-10-CM | POA: Diagnosis not present

## 2016-08-06 DIAGNOSIS — Z931 Gastrostomy status: Secondary | ICD-10-CM | POA: Diagnosis not present

## 2016-08-06 DIAGNOSIS — Z923 Personal history of irradiation: Secondary | ICD-10-CM | POA: Diagnosis not present

## 2016-08-07 ENCOUNTER — Other Ambulatory Visit: Payer: Self-pay | Admitting: Radiation Oncology

## 2016-08-07 DIAGNOSIS — C090 Malignant neoplasm of tonsillar fossa: Secondary | ICD-10-CM

## 2016-08-12 ENCOUNTER — Other Ambulatory Visit: Payer: Self-pay | Admitting: *Deleted

## 2016-08-12 ENCOUNTER — Telehealth: Payer: Self-pay | Admitting: *Deleted

## 2016-08-12 DIAGNOSIS — C099 Malignant neoplasm of tonsil, unspecified: Secondary | ICD-10-CM

## 2016-08-12 NOTE — Telephone Encounter (Signed)
Oncology Nurse Navigator Documentation  Spoke with patient's wife in follow-up to her call to [place] earlier today re bleeding at PEG insertion site. She indicated small amounts of bright red blood have been evident for the past week when he changes dressing.  Though it has "slacked" somewhat today, blood continues to be present. I indicated assessment by [facility] IR would be appropriate, that I would place order.  She understands she will be contacted by [facility] to arrange appt.  Gayleen Orem, RN, BSN, [place] Oncology Nurse [place] [facility] [place] [phone]

## 2016-08-14 ENCOUNTER — Ambulatory Visit (HOSPITAL_COMMUNITY)
Admission: RE | Admit: 2016-08-14 | Discharge: 2016-08-14 | Disposition: A | Discharge status: Home / Self Care | Payer: Medicare Other | Source: Ambulatory Visit | Attending: Radiation Oncology | Admitting: Radiation Oncology

## 2016-08-14 ENCOUNTER — Other Ambulatory Visit: Payer: Self-pay | Admitting: Radiation Oncology

## 2016-08-14 ENCOUNTER — Encounter (HOSPITAL_COMMUNITY): Payer: Self-pay | Admitting: Radiology

## 2016-08-14 DIAGNOSIS — K9421 Gastrostomy hemorrhage: Secondary | ICD-10-CM | POA: Diagnosis not present

## 2016-08-14 DIAGNOSIS — Y733 Surgical instruments, materials and gastroenterology and urology devices (including sutures) associated with adverse incidents: Secondary | ICD-10-CM | POA: Insufficient documentation

## 2016-08-14 DIAGNOSIS — C099 Malignant neoplasm of tonsil, unspecified: Secondary | ICD-10-CM

## 2016-08-14 HISTORY — PX: IR PATIENT EVAL TECH 0-60 MINS: IMG5564

## 2016-08-14 MED ORDER — SILVER NITRATE-POT NITRATE 75-25 % EX MISC
CUTANEOUS | Status: AC
  Filled 2016-08-14: qty 1

## 2016-08-14 MED ORDER — SILVER NITRATE-POT NITRATE 75-25 % EX MISC
CUTANEOUS | Status: DC | PRN
  Administered 2016-08-14: 3 via TOPICAL

## 2016-08-14 MED ORDER — SILVER NITRATE-POT NITRATE 75-25 % EX MISC
CUTANEOUS | Status: AC
  Filled 2016-08-14: qty 2

## 2016-08-21 DIAGNOSIS — R633 Feeding difficulties: Secondary | ICD-10-CM | POA: Diagnosis not present

## 2016-08-21 DIAGNOSIS — R1310 Dysphagia, unspecified: Secondary | ICD-10-CM | POA: Diagnosis not present

## 2016-08-29 ENCOUNTER — Other Ambulatory Visit: Payer: Self-pay | Admitting: *Deleted

## 2016-08-29 ENCOUNTER — Telehealth: Payer: Self-pay

## 2016-08-29 MED ORDER — OSMOLITE 1.5 CAL PO LIQD: ORAL | 12 refills | Status: DC

## 2016-08-29 NOTE — Telephone Encounter (Signed)
Pt uses osmolyte, he is up to 5 1/2 cartons per day. They have 12 cartons left. [facility] is requesting a new order be sent.

## 2016-08-30 ENCOUNTER — Other Ambulatory Visit: Payer: Self-pay | Admitting: *Deleted

## 2016-08-30 MED ORDER — OSMOLITE 1.5 CAL PO LIQD: ORAL | 12 refills | Status: DC

## 2016-10-18 ENCOUNTER — Ambulatory Visit (HOSPITAL_BASED_OUTPATIENT_CLINIC_OR_DEPARTMENT_OTHER): Payer: Medicare Other | Admitting: Hematology & Oncology

## 2016-10-18 ENCOUNTER — Other Ambulatory Visit (HOSPITAL_BASED_OUTPATIENT_CLINIC_OR_DEPARTMENT_OTHER): Payer: Medicare Other

## 2016-10-18 VITALS — BP 124/68 | HR 63 | Temp 98.3°F | Resp 16 | Wt 196.0 lb

## 2016-10-18 DIAGNOSIS — C099 Malignant neoplasm of tonsil, unspecified: Secondary | ICD-10-CM

## 2016-10-18 DIAGNOSIS — Z85818 Personal history of malignant neoplasm of other sites of lip, oral cavity, and pharynx: Secondary | ICD-10-CM

## 2016-10-18 LAB — CBC WITH DIFFERENTIAL (CANCER CENTER ONLY)
BASO#: 0 10*3/uL (ref 0.0–0.2)
BASO%: 0.5 % (ref 0.0–2.0)
EOS%: 2.9 % (ref 0.0–7.0)
Eosinophils Absolute: 0.1 10*3/uL (ref 0.0–0.5)
HEMATOCRIT: 39.2 % (ref 38.7–49.9)
HGB: 13.4 g/dL (ref 13.0–17.1)
LYMPH#: 0.5 10*3/uL — AB (ref 0.9–3.3)
LYMPH%: 13.2 % — ABNORMAL LOW (ref 14.0–48.0)
MCH: 32.9 pg (ref 28.0–33.4)
MCHC: 34.2 g/dL (ref 32.0–35.9)
MCV: 96 fL (ref 82–98)
MONO#: 0.4 10*3/uL (ref 0.1–0.9)
MONO%: 10 % (ref 0.0–13.0)
NEUT%: 73.4 % (ref 40.0–80.0)
NEUTROS ABS: 3 10*3/uL (ref 1.5–6.5)
Platelets: 159 10*3/uL (ref 145–400)
RBC: 4.07 10*6/uL — AB (ref 4.20–5.70)
RDW: 12.4 % (ref 11.1–15.7)
WBC: 4.1 10*3/uL (ref 4.0–10.0)

## 2016-10-18 LAB — CMP (CANCER CENTER ONLY)
ALT: 17 U/L (ref 10–47)
AST: 29 U/L (ref 11–38)
Albumin: 3.7 g/dL (ref 3.3–5.5)
Alkaline Phosphatase: 77 U/L (ref 26–84)
BILIRUBIN TOTAL: 0.7 mg/dL (ref 0.20–1.60)
BUN, Bld: 22 mg/dL (ref 7–22)
CO2: 31 mEq/L (ref 18–33)
Calcium: 9.7 mg/dL (ref 8.0–10.3)
Chloride: 103 mEq/L (ref 98–108)
Creat: 1.2 mg/dl (ref 0.6–1.2)
GLUCOSE: 137 mg/dL — AB (ref 73–118)
Potassium: 4.7 mEq/L (ref 3.3–4.7)
Sodium: 143 mEq/L (ref 128–145)
TOTAL PROTEIN: 6.8 g/dL (ref 6.4–8.1)

## 2016-10-18 NOTE — Progress Notes (Signed)
Hematology and Oncology Follow Up Visit  Randy Hanson [id number] 31-Jul-1940 76 y.o. 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 is here today for a follow-up.  He now is swallowing better. He is eating better. It sounds like she will be up to get his feeding tube out, hopefully within 2 months.  He went to see a specialist down at [facility]. There, they do not think that he would need his feeding tube.  He is not having any problems with pain. He's not having any problems with cough or shortness of breath. His mouth is still dry.  He actually is doing some part-time work. 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  He's had no fever. There's been no bleeding. He's had no rashes.  Overall, his performance status is ECOG 1.     Medications:  Allergies as of 10/18/2016      Reactions   Lisinopril Cough      Medication List       Accurate as of 10/18/16  2:09 PM. Always use your most recent med list.          diphenhydrAMINE 25 mg capsule Commonly known as:  BENADRYL Take 25 mg by mouth at bedtime as needed.   feeding supplement (OSMOLITE 1.5 CAL) Liqd Increase Osmolite 1.5 or equivalent to 6 cartons per day via bolus syringe.   HYDROcodone-acetaminophen 5-325 MG tablet Commonly known as:  NORCO/VICODIN   MULTIPLE MINERALS PO Take by mouth.   ranitidine 150 MG capsule Commonly known as:  ZANTAC Take 150 mg by mouth 2 (two) times daily.   terazosin 2 MG capsule Commonly known as:  HYTRIN Take 2 mg by mouth at bedtime.   vitamin C 1000 MG tablet Take 1,000 mg by mouth daily.       Allergies:  Allergies  Allergen Reactions  . Lisinopril Cough    Past Medical History, Surgical history, Social history, and Family History were reviewed and updated.  Review of  Systems: All other 10 point review of systems is negative.   Physical Exam:  weight is 196 lb (88.9 kg). His oral temperature is 98.3 F (36.8 C). His blood pressure is 124/68 and his pulse is 63. His respiration is 16 and oxygen saturation is 99%.   Wt Readings from Last 3 Encounters:  10/18/16 196 lb (88.9 kg)  07/22/16 196 lb (88.9 kg)  07/19/16 199 lb (90.3 kg)    Head and neck exam shows no ocular lesions. He does have minimal erythema and mucositis in the soft palate. His oral mucosa is somewhat dry. He has erythema on his neck. There is no radiation dermatitis breakdown on his neck.No obvious adenopathy is noted on the neck. Thyroid is nonpalpable. Lungs are clear. Cardiac exam regular rate and rhythm with no murmurs, rubs or bruits. Abdomen is soft. His feeding tube is in the upper left quadrant. This is intact. He has decent bowel sounds. He has some slight tenderness at the G-tube site. There is no palpable liver or spleen tip. Back exam shows no tenderness over the spine, ribs or hips. Extremities shows no clubbing, cyanosis or edema. Skin exam is slightly dry. Neurological exam shows no focal neurological deficits.   Lab Results  Component Value Date  WBC 4.1 10/18/2016   HGB 13.4 10/18/2016   HCT 39.2 10/18/2016   MCV 96 10/18/2016   PLT 159 10/18/2016   No results found for: FERRITIN, IRON, TIBC, UIBC, IRONPCTSAT Lab Results  Component Value Date   RBC 4.07 (L) 10/18/2016   No results found for: KPAFRELGTCHN, LAMBDASER, KAPLAMBRATIO No results found for: IGGSERUM, IGA, IGMSERUM No results found for: Marda Stalker, SPEI   Chemistry      Component Value Date/Time   NA 143 10/18/2016 1320   NA 140 07/04/2016 1014   K 4.7 10/18/2016 1320   K 4.7 07/04/2016 1014   CL 103 10/18/2016 1320   CO2 31 10/18/2016 1320   CO2 27 07/04/2016 1014   BUN 22 10/18/2016 1320   BUN 22.1 07/04/2016 1014   CREATININE 1.2  10/18/2016 1320   CREATININE 1.0 07/04/2016 1014      Component Value Date/Time   CALCIUM 9.7 10/18/2016 1320   CALCIUM 10.6 (H) 07/04/2016 1014   ALKPHOS 77 10/18/2016 1320   ALKPHOS 255 (H) 07/04/2016 1014   AST 29 10/18/2016 1320   AST 28 07/04/2016 1014   ALT 17 10/18/2016 1320   ALT 55 07/04/2016 1014   BILITOT 0.70 10/18/2016 1320   BILITOT 0.80 07/04/2016 1014     Impression and Plan: [name] is a very pleasant 50- yo white male with stage I  HPV+ - squamous cell carcinoma the right tonsil. He completed radiation and chemotherapy in early August. By his PET scan in April, he is in remission.  I'm so happy for him. He is eating better. Hopefully the feeding tube will come out in a couple months.   I do not see any evidence of recurrent disease. Again, I would have to think that given the fact that his cancer was HPV+, that he is cured.   I will plan to see him back in another 4 months.    Randy Macho, MD 8/10/20182:09 PM

## 2016-12-06 ENCOUNTER — Other Ambulatory Visit: Payer: Self-pay | Admitting: *Deleted

## 2016-12-06 ENCOUNTER — Telehealth: Payer: Self-pay

## 2016-12-06 DIAGNOSIS — C090 Malignant neoplasm of tonsillar fossa: Secondary | ICD-10-CM

## 2016-12-06 DIAGNOSIS — C099 Malignant neoplasm of tonsil, unspecified: Secondary | ICD-10-CM

## 2016-12-06 NOTE — Telephone Encounter (Signed)
Katharine Look called that pt is having trouble with his feeding tube and is does not know who to see about this. He is eating some.

## 2016-12-09 ENCOUNTER — Ambulatory Visit (HOSPITAL_COMMUNITY)
Admission: RE | Admit: 2016-12-09 | Discharge: 2016-12-09 | Disposition: A | Discharge status: Home / Self Care | Payer: Medicare Other | Source: Ambulatory Visit | Attending: Hematology & Oncology | Admitting: Hematology & Oncology

## 2016-12-09 ENCOUNTER — Encounter (HOSPITAL_COMMUNITY): Payer: Self-pay | Admitting: *Deleted

## 2016-12-09 DIAGNOSIS — C099 Malignant neoplasm of tonsil, unspecified: Secondary | ICD-10-CM

## 2016-12-09 DIAGNOSIS — C090 Malignant neoplasm of tonsillar fossa: Secondary | ICD-10-CM

## 2016-12-09 HISTORY — PX: IR PATIENT EVAL TECH 0-60 MINS: IMG5564

## 2016-12-09 MED ORDER — SILVER NITRATE-POT NITRATE 75-25 % EX MISC
CUTANEOUS | Status: AC
  Filled 2016-12-09: qty 2

## 2016-12-09 NOTE — Procedures (Signed)
Patient came in today with c/o bleeding and pain at the g tube insertion site.  It was noted that the site did have some granuloma tissue that has protruding and bleeding. The patient stated that we had used silver nitrate in the past successfully for this.  Silver Nitrate was used once again on the area today.  Rowe Robert PA evaluated the site.  The patient was satisfied with the outcome.  He wanted to know if he could get the tube removed since he was eating well now.  We advised him to call Dr Marin Olp and ask him about the possibilty of removing the g tube.

## 2016-12-20 ENCOUNTER — Encounter: Payer: Self-pay | Admitting: Radiation Oncology

## 2016-12-20 ENCOUNTER — Ambulatory Visit
Admission: RE | Admit: 2016-12-20 | Discharge: 2016-12-20 | Disposition: A | Discharge status: Home / Self Care | Payer: Medicare Other | Source: Ambulatory Visit | Attending: Radiation Oncology | Admitting: Radiation Oncology

## 2016-12-20 VITALS — BP 132/74 | HR 63 | Temp 97.7°F | Ht 72.0 in | Wt 191.8 lb

## 2016-12-20 DIAGNOSIS — Z931 Gastrostomy status: Secondary | ICD-10-CM | POA: Insufficient documentation

## 2016-12-20 DIAGNOSIS — R634 Abnormal weight loss: Secondary | ICD-10-CM

## 2016-12-20 DIAGNOSIS — Z888 Allergy status to other drugs, medicaments and biological substances status: Secondary | ICD-10-CM | POA: Insufficient documentation

## 2016-12-20 DIAGNOSIS — Z1329 Encounter for screening for other suspected endocrine disorder: Secondary | ICD-10-CM

## 2016-12-20 DIAGNOSIS — C090 Malignant neoplasm of tonsillar fossa: Secondary | ICD-10-CM | POA: Diagnosis not present

## 2016-12-20 DIAGNOSIS — Z79899 Other long term (current) drug therapy: Secondary | ICD-10-CM | POA: Diagnosis not present

## 2016-12-20 DIAGNOSIS — Z9889 Other specified postprocedural states: Secondary | ICD-10-CM | POA: Insufficient documentation

## 2016-12-20 NOTE — Progress Notes (Addendum)
Radiation Oncology         [phone] ________________________________  Name: Randy Hanson MRN: [id number]  Date: 12/20/2016  DOB: 09/04/1940  Follow-Up Visit Note  CC: Randy Noble, MD  Randy Noble, MD  Diagnosis and Prior Radiotherapy:       ICD-10-CM   1. Carcinoma of tonsillar fossa (HCC) C09.0     Stage IVA (U1L2GM0) - HPV + - Squamous cell carcinoma of the right tonsil 08/28/15 - 10/16/15: 1. The Right tonsil and bilateral neck were treated PTV High to 70 Gy in 35 fractions at 2 Gy per fraction. 2. The Right tonsil and bilateral neck were treated PTV Med to 63 Gy in 35 fractions at 1.8 Gy per fraction. 3. The Right tonsil and bilateral neck were treated PTV Low to 56 Gy in 35 fractions at 1.6 Gy per fraction.  Chief Complaint: Follow up of tonsillar cancer  Narrative:  The patient returns today for routine follow-up of radiation completed 10/16/15 to his Right Tonsil and bilateral neck.   Doing well.  Swallows most foods, uses PEG occasionally.    Pleased with resolution of dysphagia.  The patient is not using tobacco products. He isusing daily fluoride toothpaste, has trays, not seeing a dentist. His next appointment with Dr. Hezzie Hanson is unknown.  ALLERGIES:  is allergic to lisinopril.  Meds: Current Outpatient Prescriptions  Medication Sig Dispense Refill  . diphenhydrAMINE (BENADRYL) 25 mg capsule Take 25 mg by mouth at bedtime as needed.    . MULTIPLE MINERALS PO Take by mouth.    . Nutritional Supplements (FEEDING SUPPLEMENT, OSMOLITE 1.5 CAL,) LIQD Increase Osmolite 1.5 or equivalent to 6 cartons per day via bolus syringe. 180 Bottle 12  . terazosin (HYTRIN) 2 MG capsule Take 2 mg by mouth at bedtime.    Marland Kitchen HYDROcodone-acetaminophen (NORCO/VICODIN) 5-325 MG tablet     . ranitidine (ZANTAC) 150 MG capsule Take 150 mg by mouth 2 (two) times daily.     No current facility-administered medications for this encounter.     Physical Findings: The patient is in no  acute distress. Patient is alert and oriented. Wt Readings from Last 3 Encounters:  12/20/16 191 lb 12.8 oz (87 kg)  10/18/16 196 lb (88.9 kg)  07/22/16 196 lb (88.9 kg)    height is 6' (1.829 m) and weight is 191 lb 12.8 oz (87 kg). His temperature is 97.7 F (36.5 C). His blood pressure is 132/74 and his pulse is 63. His oxygen saturation is 99%. .  General: Alert and oriented, in no acute distress. HEENT:  no lesions in oropharynx. Mucous membranes are dry. Neck: No palpable cervical or supraclavicular adenopathy or masses.  Skin : healed well over neck Heart: RRR no murmurs Chest: CTAB Ext: no edema MSK:   good strength in all extremities Lymphatics: see Neck Exam Psychiatric: Judgment and insight are intact. Affect is appropriate.  Lab Findings: Lab Results  Component Value Date   WBC 4.1 10/18/2016   HGB 13.4 10/18/2016   HCT 39.2 10/18/2016   MCV 96 10/18/2016   PLT 159 10/18/2016    Lab Results  Component Value Date   TSH 1.066 06/27/2016    Radiographic Findings: No results found.  Impression/Plan:    1) Head and Neck Cancer Status: NED   2) Nutritional Status: 5lb loss.  will call us if not using PEG for a month and maintaining wt - then can have PEG removed Wt Readings from Last 3 Encounters:  12/20/16 191  lb 12.8 oz (87 kg)  10/18/16 196 lb (88.9 kg)  07/22/16 196 lb (88.9 kg)     3) Risk Factors: The patient has been educated about risk factors including alcohol and tobacco abuse; they understand that avoidance of alcohol and tobacco is important to prevent recurrences as well as other cancers.  Abstaining   4) Swallowing: much improved  5) Dental: Urged to obtain followup with dentistry, and dental hygiene including fluoride rinses.  6) Thyroid function: TSH within normal limits. Continue to check every 6-12 months. Lab Results  Component Value Date   TSH 1.066 06/27/2016    7)  Follow up with Dr. Hezzie Hanson in 40mo. The patient will follow up  with me 6 months later with TSH. Continue f/u w/ med/onc  I spent 25 minutes face to face with patient, over 50% on counseling and care coordination.   ______________________________________   Lonie Peak, MD  This document serves as a record of services personally performed by Lonie Peak, MD. It was created on her behalf by Lavenia Atlas, a trained medical scribe. The creation of this record is based on the scribe's personal observations and the provider's statements to them. This document has been checked and approved by the attending provider.

## 2016-12-20 NOTE — Progress Notes (Signed)
[name] presents for follow up of radiation completed:   08/28/2015-10/16/2015: 1. The Right tonsil and bilateral neck were treated PTV High to 70 Gy in 35 fractions at 2 Gy per fraction. 2. The Right tonsil and bilateral neck were treated PTV Med to 63 Gy in 35 fractions at 1.8 Gy per fraction. 3. The Right tonsil and bilateral neck were treated PTV Low to 56 Gy in 35 fractions at 1.6 Gy per fraction  Pain issues, if any: He denies Using a feeding tube?: Occasionally. He is using it rarely. He tells me at times he uses it out of convenience.  Weight changes, if any:  Wt Readings from Last 3 Encounters:  12/20/16 191 lb 12.8 oz (87 kg)  10/18/16 196 lb (88.9 kg)  07/22/16 196 lb (88.9 kg)   Swallowing issues, if any: He needs to moisten his food. He drinks lots of water. He needs to "think about swallowing". He does have difficulty with bread.  Smoking or chewing tobacco? He denies.  Using fluoride trays daily? He uses fluoride toothpaste.  Last ENT visit was on: Dr. Nicolette Bang not in awhile.  Other notable issues, if any:  BP 132/74   Pulse 63   Temp 97.7 F (36.5 C)   Ht 6' (1.829 m)   Wt 191 lb 12.8 oz (87 kg)   SpO2 99% Comment: room air  BMI 26.01 kg/m

## 2016-12-23 ENCOUNTER — Telehealth: Payer: Self-pay | Admitting: *Deleted

## 2016-12-23 NOTE — Telephone Encounter (Signed)
CALLED PATIENT TO INFORM OF LAB AND FU ON 06-20-17, SPOKE WITH PATIENT'S WIFE SANDRA AND SHE IS AWARE OF THESE APPTS.

## 2016-12-23 NOTE — Telephone Encounter (Signed)
Oncology Nurse Navigator Documentation  Per Dr. Pearlie Oyster request for patient to see Dr. Nicolette Bang in 3 months s/p his 12/20/16 follow-up with her, received email confirmation from navigator Fransico Setters appt scheduled for 03/25/17.  Gayleen Orem, RN, BSN, [facility] Nurse [place] [facility] [place] [phone]

## 2017-01-22 DIAGNOSIS — B029 Zoster without complications: Secondary | ICD-10-CM | POA: Diagnosis not present

## 2017-01-23 ENCOUNTER — Ambulatory Visit (HOSPITAL_COMMUNITY): Payer: Medicare Other

## 2017-02-17 ENCOUNTER — Other Ambulatory Visit: Payer: No Typology Code available for payment source

## 2017-02-17 ENCOUNTER — Ambulatory Visit: Payer: No Typology Code available for payment source | Admitting: Hematology & Oncology

## 2017-02-20 ENCOUNTER — Ambulatory Visit (HOSPITAL_BASED_OUTPATIENT_CLINIC_OR_DEPARTMENT_OTHER): Payer: Medicare Other | Admitting: Hematology & Oncology

## 2017-02-20 ENCOUNTER — Other Ambulatory Visit (HOSPITAL_BASED_OUTPATIENT_CLINIC_OR_DEPARTMENT_OTHER): Payer: Medicare Other

## 2017-02-20 ENCOUNTER — Encounter: Payer: Self-pay | Admitting: Hematology & Oncology

## 2017-02-20 ENCOUNTER — Other Ambulatory Visit: Payer: Self-pay

## 2017-02-20 ENCOUNTER — Other Ambulatory Visit: Payer: Self-pay | Admitting: Family

## 2017-02-20 VITALS — BP 131/66 | HR 59 | Temp 98.0°F | Resp 16 | Wt 189.0 lb

## 2017-02-20 DIAGNOSIS — C099 Malignant neoplasm of tonsil, unspecified: Secondary | ICD-10-CM

## 2017-02-20 DIAGNOSIS — Z85818 Personal history of malignant neoplasm of other sites of lip, oral cavity, and pharynx: Secondary | ICD-10-CM

## 2017-02-20 DIAGNOSIS — R42 Dizziness and giddiness: Secondary | ICD-10-CM

## 2017-02-20 DIAGNOSIS — B0229 Other postherpetic nervous system involvement: Secondary | ICD-10-CM

## 2017-02-20 LAB — CMP (CANCER CENTER ONLY)
ALBUMIN: 3.5 g/dL (ref 3.3–5.5)
ALK PHOS: 72 U/L (ref 26–84)
ALT: 17 U/L (ref 10–47)
AST: 23 U/L (ref 11–38)
BILIRUBIN TOTAL: 0.7 mg/dL (ref 0.20–1.60)
BUN, Bld: 14 mg/dL (ref 7–22)
CALCIUM: 9.8 mg/dL (ref 8.0–10.3)
CO2: 31 meq/L (ref 18–33)
CREATININE: 1.1 mg/dL (ref 0.6–1.2)
Chloride: 104 mEq/L (ref 98–108)
Glucose, Bld: 107 mg/dL (ref 73–118)
Potassium: 4.7 mEq/L (ref 3.3–4.7)
SODIUM: 146 meq/L — AB (ref 128–145)
TOTAL PROTEIN: 6.9 g/dL (ref 6.4–8.1)

## 2017-02-20 LAB — CBC WITH DIFFERENTIAL (CANCER CENTER ONLY)
BASO#: 0 10*3/uL (ref 0.0–0.2)
BASO%: 0.6 % (ref 0.0–2.0)
EOS%: 2.3 % (ref 0.0–7.0)
Eosinophils Absolute: 0.1 10*3/uL (ref 0.0–0.5)
HEMATOCRIT: 39.1 % (ref 38.7–49.9)
HEMOGLOBIN: 13.3 g/dL (ref 13.0–17.1)
LYMPH#: 0.7 10*3/uL — AB (ref 0.9–3.3)
LYMPH%: 12.5 % — ABNORMAL LOW (ref 14.0–48.0)
MCH: 31.8 pg (ref 28.0–33.4)
MCHC: 34 g/dL (ref 32.0–35.9)
MCV: 94 fL (ref 82–98)
MONO#: 0.5 10*3/uL (ref 0.1–0.9)
MONO%: 10.1 % (ref 0.0–13.0)
NEUT%: 74.5 % (ref 40.0–80.0)
NEUTROS ABS: 3.9 10*3/uL (ref 1.5–6.5)
Platelets: 193 10*3/uL (ref 145–400)
RBC: 4.18 10*6/uL — AB (ref 4.20–5.70)
RDW: 13.2 % (ref 11.1–15.7)
WBC: 5.3 10*3/uL (ref 4.0–10.0)

## 2017-02-20 NOTE — Progress Notes (Signed)
Hematology and Oncology Follow Up Visit  Randy Hanson [id number] 1940/04/10 76 y.o. 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  He looks fantastic.  He did have a episode of shingles on the left arm back in November.  This appears to be the see 5 dermatome.  He still has a little bit of postherpetic neuralgia but this is very localized to the left upper arm.  His main issue is the feeding tube.  He has this feeding tube in.  He has not used it for a couple months.  He ate Thanksgiving dinner without problems.  He has had no odynophagia or dysphasia.  As such, we will get this tube taken out.  He has not had any vomiting.  He has had no bleeding outside of that associate with the feeding tube.  There is no cough or shortness of breath.  He does have some dizziness.  I am not sure why he has this dizziness.  It is transient.  It is not related to standing or sitting.  Is not related to headache.  There is no visual changes.  I will get a carotid Doppler to see if he has any issues there.  I worry that with his radiation therapy to the neck, that he may have some carotid stenosis.    Overall, his performance status is ECOG 1.     Medications:  Allergies as of 02/20/2017      Reactions   Lisinopril Cough   Acyclovir And Related Other (See Comments)   Fever, chills      Medication List        Accurate as of 02/20/17 11:19 AM. Always use your most recent med list.          diphenhydrAMINE 25 mg capsule Commonly known as:  BENADRYL Take 25 mg by mouth at bedtime as needed.   feeding supplement (OSMOLITE 1.5 CAL) Liqd Increase Osmolite 1.5 or equivalent to 6 cartons per day via bolus syringe.   HYDROcodone-acetaminophen 5-325 MG tablet Commonly known as:   NORCO/VICODIN   MULTIPLE MINERALS PO Take by mouth.   ranitidine 150 MG capsule Commonly known as:  ZANTAC Take 150 mg by mouth 2 (two) times daily.   terazosin 2 MG capsule Commonly known as:  HYTRIN Take 2 mg by mouth at bedtime.       Allergies:  Allergies  Allergen Reactions  . Lisinopril Cough  . Acyclovir And Related Other (See Comments)    Fever, chills     Past Medical History, Surgical history, Social history, and Family History were reviewed and updated.  Review of Systems: As stated in the interim history  Physical Exam:  weight is 189 lb (85.7 kg). His oral temperature is 98 F (36.7 C). His blood pressure is 131/66 and his pulse is 59 (abnormal). His respiration is 16 and oxygen saturation is 100%.   Wt Readings from Last 3 Encounters:  02/20/17 189 lb (85.7 kg)  12/20/16 191 lb 12.8 oz (87 kg)  10/18/16 196 lb (88.9 kg)    Physical Exam  Constitutional: He is oriented to person, place, and time.  HENT:  Head: Normocephalic and atraumatic.  Mouth/Throat: Oropharynx is clear and moist.  Eyes: EOM are normal. Pupils  are equal, round, and reactive to light.  Neck: Normal range of motion.  Cardiovascular: Normal rate, regular rhythm and normal heart sounds.  Pulmonary/Chest: Effort normal and breath sounds normal.  Abdominal: Soft. Bowel sounds are normal.  Musculoskeletal: Normal range of motion. He exhibits no edema, tenderness or deformity.  Lymphadenopathy:    He has no cervical adenopathy.  Neurological: He is alert and oriented to person, place, and time.  Skin: Skin is warm and dry. No rash noted. No erythema.  Psychiatric: He has a normal mood and affect. His behavior is normal. Judgment and thought content normal.  Vitals reviewed.    Lab Results  Component Value Date   WBC 5.3 02/20/2017   HGB 13.3 02/20/2017   HCT 39.1 02/20/2017   MCV 94 02/20/2017   PLT 193 02/20/2017   No results found for: FERRITIN, IRON, TIBC, UIBC,  IRONPCTSAT Lab Results  Component Value Date   RBC 4.18 (L) 02/20/2017   No results found for: KPAFRELGTCHN, LAMBDASER, KAPLAMBRATIO No results found for: IGGSERUM, IGA, IGMSERUM No results found for: Georgann Housekeeper, MSPIKE, SPEI   Chemistry      Component Value Date/Time   NA 146 (H) 02/20/2017 1007   NA 140 07/04/2016 1014   K 4.7 02/20/2017 1007   K 4.7 07/04/2016 1014   CL 104 02/20/2017 1007   CO2 31 02/20/2017 1007   CO2 27 07/04/2016 1014   BUN 14 02/20/2017 1007   BUN 22.1 07/04/2016 1014   CREATININE 1.1 02/20/2017 1007   CREATININE 1.0 07/04/2016 1014      Component Value Date/Time   CALCIUM 9.8 02/20/2017 1007   CALCIUM 10.6 (H) 07/04/2016 1014   ALKPHOS 72 02/20/2017 1007   ALKPHOS 255 (H) 07/04/2016 1014   AST 23 02/20/2017 1007   AST 28 07/04/2016 1014   ALT 17 02/20/2017 1007   ALT 55 07/04/2016 1014   BILITOT 0.70 02/20/2017 1007   BILITOT 0.80 07/04/2016 1014     Impression and Plan: Randy Hanson is a very pleasant 21- yo white male with stage I  HPV+ - squamous cell carcinoma the right tonsil. He completed radiation and chemotherapy in early August, 2017. By his PET scan in April, he is in remission.  I will go ahead and put the order in to have this feeding tube taken out.  He is not using it.  I said I do not see why he needs to have it in.  It bleeds.  I think it will be more of a problem just keeping it in and it will cause him aggravation.  We will plan to get him back in 4 more months.  We will get him through the holidays and winter.     [name], MD 12/13/201811:19 AM

## 2017-02-24 ENCOUNTER — Ambulatory Visit (HOSPITAL_COMMUNITY)
Admission: RE | Admit: 2017-02-24 | Discharge: 2017-02-24 | Disposition: A | Discharge status: Home / Self Care | Payer: Medicare Other | Source: Ambulatory Visit | Attending: Hematology & Oncology | Admitting: Hematology & Oncology

## 2017-02-24 ENCOUNTER — Ambulatory Visit (HOSPITAL_BASED_OUTPATIENT_CLINIC_OR_DEPARTMENT_OTHER)
Admission: RE | Admit: 2017-02-24 | Discharge: 2017-02-24 | Disposition: A | Discharge status: Home / Self Care | Payer: Medicare Other | Source: Ambulatory Visit | Attending: Family | Admitting: Family

## 2017-02-24 ENCOUNTER — Encounter (HOSPITAL_COMMUNITY): Payer: Self-pay | Admitting: General Surgery

## 2017-02-24 DIAGNOSIS — C099 Malignant neoplasm of tonsil, unspecified: Secondary | ICD-10-CM

## 2017-02-24 DIAGNOSIS — Z431 Encounter for attention to gastrostomy: Secondary | ICD-10-CM | POA: Diagnosis not present

## 2017-02-24 DIAGNOSIS — Z8589 Personal history of malignant neoplasm of other organs and systems: Secondary | ICD-10-CM | POA: Diagnosis not present

## 2017-02-24 DIAGNOSIS — Z85818 Personal history of malignant neoplasm of other sites of lip, oral cavity, and pharynx: Secondary | ICD-10-CM | POA: Insufficient documentation

## 2017-02-24 DIAGNOSIS — R42 Dizziness and giddiness: Secondary | ICD-10-CM

## 2017-02-24 HISTORY — PX: IR GASTROSTOMY TUBE REMOVAL: IMG5492

## 2017-02-24 MED ORDER — LIDOCAINE VISCOUS 2 % MT SOLN
OROMUCOSAL | Status: AC
  Filled 2017-02-24: qty 15

## 2017-02-24 NOTE — Progress Notes (Signed)
Carotid artery duplex has been completed. 1-39% ICA stenosis bilaterally.  02/24/17 10:04 AM Randy Hanson RVT

## 2017-02-25 ENCOUNTER — Telehealth: Payer: Self-pay | Admitting: *Deleted

## 2017-02-25 NOTE — Telephone Encounter (Addendum)
Patient's wife is aware of results  ----- Message from Volanda Napoleon, MD sent at 02/24/2017  5:47 PM EST ----- Call - No significant stenosis of the carotid arteries!!  pete

## 2017-03-25 DIAGNOSIS — Z9221 Personal history of antineoplastic chemotherapy: Secondary | ICD-10-CM | POA: Diagnosis not present

## 2017-03-25 DIAGNOSIS — Z85818 Personal history of malignant neoplasm of other sites of lip, oral cavity, and pharynx: Secondary | ICD-10-CM | POA: Diagnosis not present

## 2017-03-25 DIAGNOSIS — Z8589 Personal history of malignant neoplasm of other organs and systems: Secondary | ICD-10-CM | POA: Diagnosis not present

## 2017-03-25 DIAGNOSIS — Z923 Personal history of irradiation: Secondary | ICD-10-CM | POA: Diagnosis not present

## 2017-03-25 DIAGNOSIS — Z87891 Personal history of nicotine dependence: Secondary | ICD-10-CM | POA: Diagnosis not present

## 2017-03-25 DIAGNOSIS — K1321 Leukoplakia of oral mucosa, including tongue: Secondary | ICD-10-CM | POA: Diagnosis not present

## 2017-03-25 DIAGNOSIS — Z7189 Other specified counseling: Secondary | ICD-10-CM | POA: Diagnosis not present

## 2017-03-25 DIAGNOSIS — L439 Lichen planus, unspecified: Secondary | ICD-10-CM | POA: Diagnosis not present

## 2017-04-17 DIAGNOSIS — L430 Hypertrophic lichen planus: Secondary | ICD-10-CM | POA: Diagnosis not present

## 2017-04-29 DIAGNOSIS — R42 Dizziness and giddiness: Secondary | ICD-10-CM | POA: Diagnosis not present

## 2017-04-29 DIAGNOSIS — E559 Vitamin D deficiency, unspecified: Secondary | ICD-10-CM | POA: Diagnosis not present

## 2017-04-29 DIAGNOSIS — E538 Deficiency of other specified B group vitamins: Secondary | ICD-10-CM | POA: Diagnosis not present

## 2017-04-29 DIAGNOSIS — K219 Gastro-esophageal reflux disease without esophagitis: Secondary | ICD-10-CM | POA: Diagnosis not present

## 2017-04-29 DIAGNOSIS — I10 Essential (primary) hypertension: Secondary | ICD-10-CM | POA: Diagnosis not present

## 2017-04-29 DIAGNOSIS — Z125 Encounter for screening for malignant neoplasm of prostate: Secondary | ICD-10-CM | POA: Diagnosis not present

## 2017-04-29 DIAGNOSIS — R739 Hyperglycemia, unspecified: Secondary | ICD-10-CM | POA: Diagnosis not present

## 2017-04-29 DIAGNOSIS — R232 Flushing: Secondary | ICD-10-CM | POA: Diagnosis not present

## 2017-04-29 DIAGNOSIS — R350 Frequency of micturition: Secondary | ICD-10-CM | POA: Diagnosis not present

## 2017-04-29 DIAGNOSIS — N390 Urinary tract infection, site not specified: Secondary | ICD-10-CM | POA: Diagnosis not present

## 2017-04-29 DIAGNOSIS — G609 Hereditary and idiopathic neuropathy, unspecified: Secondary | ICD-10-CM | POA: Diagnosis not present

## 2017-05-08 ENCOUNTER — Other Ambulatory Visit: Payer: Self-pay | Admitting: Oral Surgery

## 2017-05-08 DIAGNOSIS — K136 Irritative hyperplasia of oral mucosa: Secondary | ICD-10-CM | POA: Diagnosis not present

## 2017-05-08 DIAGNOSIS — K148 Other diseases of tongue: Secondary | ICD-10-CM | POA: Diagnosis not present

## 2017-05-08 DIAGNOSIS — D1039 Benign neoplasm of other parts of mouth: Secondary | ICD-10-CM | POA: Diagnosis not present

## 2017-05-08 DIAGNOSIS — D101 Benign neoplasm of tongue: Secondary | ICD-10-CM | POA: Diagnosis not present

## 2017-05-27 DIAGNOSIS — R42 Dizziness and giddiness: Secondary | ICD-10-CM | POA: Diagnosis not present

## 2017-05-27 DIAGNOSIS — N390 Urinary tract infection, site not specified: Secondary | ICD-10-CM | POA: Diagnosis not present

## 2017-05-27 DIAGNOSIS — R351 Nocturia: Secondary | ICD-10-CM | POA: Diagnosis not present

## 2017-05-27 DIAGNOSIS — I10 Essential (primary) hypertension: Secondary | ICD-10-CM | POA: Diagnosis not present

## 2017-05-27 DIAGNOSIS — R350 Frequency of micturition: Secondary | ICD-10-CM | POA: Diagnosis not present

## 2017-06-18 NOTE — Progress Notes (Signed)
Randy Hanson presents for follow up of radiation completed 10/16/15 to his right tonsil and bilateral neck.    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 He reports pain an 8 when chewing food Using a feeding tube?: removed 12/18 Weight changes, if any:  Wt Readings from Last 3 Encounters:  06/20/17 193 lb (87.5 kg)  06/19/17 191 lb (86.6 kg)  02/20/17 189 lb (85.7 kg)   Swallowing issues, if any: He "has to pay attention when swallowing". He is able to eat most foods.  Smoking or chewing tobacco? No Using fluoride trays daily? No Last ENT visit was on: Dr. Nicolette Bang 03/25/17, to see next in July.  Other notable issues, if any:  He saw Laverna Peace NP yesterday.  He has dizziness that happens at various times. He could be sitting, walking, or doing other activities. Dr. Marin Olp has checked an [place] of his carotid arteries which was negative.  He also reports continued pain to his PAC site which worries him.   BP (!) 142/63   Pulse (!) 57   Temp 98.3 F (36.8 C)   Ht 6' (1.829 m)   Wt 193 lb (87.5 kg)   BMI 26.18 kg/m

## 2017-06-19 ENCOUNTER — Other Ambulatory Visit: Payer: Self-pay

## 2017-06-19 ENCOUNTER — Inpatient Hospital Stay: Payer: Medicare Other | Attending: Hematology & Oncology | Admitting: Family

## 2017-06-19 ENCOUNTER — Inpatient Hospital Stay: Payer: Medicare Other

## 2017-06-19 VITALS — BP 137/70 | HR 61 | Temp 97.4°F | Resp 18 | Wt 191.0 lb

## 2017-06-19 DIAGNOSIS — C099 Malignant neoplasm of tonsil, unspecified: Secondary | ICD-10-CM | POA: Insufficient documentation

## 2017-06-19 DIAGNOSIS — Z9221 Personal history of antineoplastic chemotherapy: Secondary | ICD-10-CM | POA: Diagnosis not present

## 2017-06-19 DIAGNOSIS — Z923 Personal history of irradiation: Secondary | ICD-10-CM

## 2017-06-19 LAB — CMP (CANCER CENTER ONLY)
ALBUMIN: 4 g/dL (ref 3.5–5.0)
ALT: 13 U/L (ref 0–55)
AST: 21 U/L (ref 5–34)
Alkaline Phosphatase: 75 U/L (ref 40–150)
Anion gap: 8 (ref 3–11)
BUN: 18 mg/dL (ref 7–26)
CHLORIDE: 105 mmol/L (ref 98–109)
CO2: 27 mmol/L (ref 22–29)
CREATININE: 1.15 mg/dL (ref 0.70–1.30)
Calcium: 9.9 mg/dL (ref 8.4–10.4)
GFR, Est AFR Am: >60 mL/min (ref >60)
GLUCOSE: 102 mg/dL (ref 70–140)
POTASSIUM: 4.5 mmol/L (ref 3.5–5.1)
Sodium: 140 mmol/L (ref 136–145)
Total Bilirubin: 0.5 mg/dL (ref 0.2–1.2)
Total Protein: 7.1 g/dL (ref 6.4–8.3)

## 2017-06-19 LAB — CBC WITH DIFFERENTIAL (CANCER CENTER ONLY)
BASOS ABS: 0 10*3/uL (ref 0.0–0.1)
BASOS PCT: 1 %
EOS PCT: 2 %
Eosinophils Absolute: 0.1 10*3/uL (ref 0.0–0.5)
HEMATOCRIT: 41.9 % (ref 38.7–49.9)
Hemoglobin: 14.5 g/dL (ref 13.0–17.1)
LYMPHS PCT: 15 %
Lymphs Abs: 0.7 10*3/uL — ABNORMAL LOW (ref 0.9–3.3)
MCH: 31.7 pg (ref 28.0–33.4)
MCHC: 34.6 g/dL (ref 32.0–35.9)
MCV: 91.7 fL (ref 82.0–98.0)
MONO ABS: 0.5 10*3/uL (ref 0.1–0.9)
Monocytes Relative: 11 %
NEUTROS ABS: 3.3 10*3/uL (ref 1.5–6.5)
Neutrophils Relative %: 71 %
PLATELETS: 185 10*3/uL (ref 145–400)
RBC: 4.57 MIL/uL (ref 4.20–5.70)
RDW: 12.8 % (ref 11.1–15.7)
WBC: 4.7 10*3/uL (ref 4.0–10.0)

## 2017-06-19 NOTE — Progress Notes (Signed)
Hematology and Oncology Follow Up Visit  ATZEL [place] [id number] [birth year] 77 y.o. 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 He is doing well but is still having occasional dizziness. He plans to follow-up with his PCP regarding this issue.  He has had no fever, chills, n/v, cough, rash, SOB, chest pain, palpitations, abdominal pain or changes in bowel or bladder habits.  He has occasional constipation and takes a laxative as needed.  No swelling, tenderness, numbness or tingling in his extremities. No c/o pain.  No lymphadenopathy noted on exam.  He has had no episodes of bleeding, no bruising or petechiae.  He has maintained a good appetite and takes time to chew and slowly swallow his food. He denies having had any episodes of choking.  He did have his teeth pulled and has not gotten his dentures yet. He has a benign sore in his mouth that is still healing.  He is hydrating well. His weight is stable.   ECOG Performance Status: 1 - Symptomatic but completely ambulatory  Medications:  Allergies as of 06/19/2017      Reactions   Lisinopril Cough   Acyclovir And Related Other (See Comments)   Fever, chills   Famciclovir Other (See Comments)   Fever, chills      Medication List        Accurate as of 06/19/17 12:43 PM. Always use your most recent med list.          diphenhydrAMINE 25 mg capsule Commonly known as:  BENADRYL Take 25 mg by mouth at bedtime as needed.   feeding supplement (OSMOLITE 1.5 CAL) Liqd Increase Osmolite 1.5 or equivalent to 6 cartons per day via bolus syringe.   HYDROcodone-acetaminophen 5-325 MG tablet Commonly known as:  NORCO/VICODIN   MULTIPLE MINERALS PO Take by mouth.   ranitidine  150 MG capsule Commonly known as:  ZANTAC Take 150 mg by mouth 2 (two) times daily.   terazosin 2 MG capsule Commonly known as:  HYTRIN Take 2 mg by mouth at bedtime.       Allergies:  Allergies  Allergen Reactions  . Lisinopril Cough  . Acyclovir And Related Other (See Comments)    Fever, chills   . Famciclovir Other (See Comments)    Fever, chills    Past Medical History, Surgical history, Social history, and Family History were reviewed and updated.  Review of Systems: All other 10 point review of systems is negative.   Physical Exam:  weight is 191 lb (86.6 kg). His oral temperature is 97.4 F (36.3 C) (abnormal). His blood pressure is 137/70 and his pulse is 61. His respiration is 18 and oxygen saturation is 100%.   Wt Readings from Last 3 Encounters:  06/19/17 191 lb (86.6 kg)  02/20/17 189 lb (85.7 kg)  12/20/16 191 lb 12.8 oz (87 kg)    Ocular: Sclerae unicteric, pupils equal, round and reactive to light Ear-nose-throat: Oropharynx clear, dentition fair Lymphatic: No cervical, supraclavicular or axillary adenopathy Lungs no rales or rhonchi, good excursion bilaterally Heart regular rate and rhythm, no murmur appreciated Abd soft, nontender, positive bowel sounds, no liver or spleen tip palpated on exam, no fluid wave  MSK no focal spinal tenderness,  no joint edema Neuro: non-focal, well-oriented, appropriate affect Breasts: Deferred   Lab Results  Component Value Date   WBC 4.7 06/19/2017   HGB 13.3 02/20/2017   HCT 41.9 06/19/2017   MCV 91.7 06/19/2017   PLT 185 06/19/2017   No results found for: FERRITIN, IRON, TIBC, UIBC, IRONPCTSAT Lab Results  Component Value Date   RBC 4.57 06/19/2017   No results found for: KPAFRELGTCHN, LAMBDASER, KAPLAMBRATIO No results found for: IGGSERUM, IGA, IGMSERUM No results found for: Georgann Housekeeper, MSPIKE, SPEI   Chemistry      Component Value Date/Time   NA 146  (H) 02/20/2017 1007   NA 140 07/04/2016 1014   K 4.7 02/20/2017 1007   K 4.7 07/04/2016 1014   CL 104 02/20/2017 1007   CO2 31 02/20/2017 1007   CO2 27 07/04/2016 1014   BUN 14 02/20/2017 1007   BUN 22.1 07/04/2016 1014   CREATININE 1.1 02/20/2017 1007   CREATININE 1.0 07/04/2016 1014      Component Value Date/Time   CALCIUM 9.8 02/20/2017 1007   CALCIUM 10.6 (H) 07/04/2016 1014   ALKPHOS 72 02/20/2017 1007   ALKPHOS 255 (H) 07/04/2016 1014   AST 23 02/20/2017 1007   AST 28 07/04/2016 1014   ALT 17 02/20/2017 1007   ALT 55 07/04/2016 1014   BILITOT 0.70 02/20/2017 1007   BILITOT 0.80 07/04/2016 1014      Impression and Plan: Randy Hanson is a very pleasant pleasant 77 yo caucasian gentleman with history of stage I HPV+ squamous cell carcinoma of the right tonsil. He completed radiation and chemotherapy in August 2017. So far he has done well and remains in remission.  We will continue to follow along with him and see him back in another 4 months.  They will contact our office with any questions or concerns. We can certainly see him sooner if need be.   Emeline Gins, NP 4/11/201912:43 PM

## 2017-06-20 ENCOUNTER — Other Ambulatory Visit: Payer: Self-pay

## 2017-06-20 ENCOUNTER — Ambulatory Visit
Admission: RE | Admit: 2017-06-20 | Discharge: 2017-06-20 | Disposition: A | Discharge status: Home / Self Care | Payer: Medicare Other | Source: Ambulatory Visit | Attending: Radiation Oncology | Admitting: Radiation Oncology

## 2017-06-20 ENCOUNTER — Telehealth: Payer: Self-pay

## 2017-06-20 ENCOUNTER — Encounter: Payer: Self-pay | Admitting: Radiation Oncology

## 2017-06-20 VITALS — BP 142/63 | HR 57 | Temp 98.3°F | Ht 72.0 in | Wt 193.0 lb

## 2017-06-20 DIAGNOSIS — Z923 Personal history of irradiation: Secondary | ICD-10-CM | POA: Diagnosis not present

## 2017-06-20 DIAGNOSIS — Z08 Encounter for follow-up examination after completed treatment for malignant neoplasm: Secondary | ICD-10-CM | POA: Diagnosis not present

## 2017-06-20 DIAGNOSIS — C090 Malignant neoplasm of tonsillar fossa: Secondary | ICD-10-CM | POA: Insufficient documentation

## 2017-06-20 DIAGNOSIS — Z85818 Personal history of malignant neoplasm of other sites of lip, oral cavity, and pharynx: Secondary | ICD-10-CM | POA: Insufficient documentation

## 2017-06-20 DIAGNOSIS — K0889 Other specified disorders of teeth and supporting structures: Secondary | ICD-10-CM | POA: Diagnosis not present

## 2017-06-20 DIAGNOSIS — Z79899 Other long term (current) drug therapy: Secondary | ICD-10-CM | POA: Insufficient documentation

## 2017-06-20 DIAGNOSIS — R42 Dizziness and giddiness: Secondary | ICD-10-CM | POA: Diagnosis not present

## 2017-06-20 DIAGNOSIS — L439 Lichen planus, unspecified: Secondary | ICD-10-CM | POA: Diagnosis not present

## 2017-06-20 DIAGNOSIS — R634 Abnormal weight loss: Secondary | ICD-10-CM

## 2017-06-20 DIAGNOSIS — Z1329 Encounter for screening for other suspected endocrine disorder: Secondary | ICD-10-CM

## 2017-06-20 LAB — TSH: TSH: 1.091 u[IU]/mL (ref 0.320–4.118)

## 2017-06-20 LAB — LACTATE DEHYDROGENASE: LDH: 162 U/L (ref 125–245)

## 2017-06-20 NOTE — Telephone Encounter (Signed)
I called [name] at the request of Dr. Isidore Moos and informed his wife that the results of his TSH lab this morning was normal. She voiced her understanding and knows to call me if she has any further questions or concerns.

## 2017-06-20 NOTE — Progress Notes (Signed)
Radiation Oncology         [phone] ________________________________  Name: Randy Hanson MRN: [id number]  Date: 06/20/2017  DOB: 1940-11-27  Follow-Up Visit Note  CC: [name], MD  [name], MD  Diagnosis and Prior Radiotherapy:       ICD-10-CM   1. Carcinoma of tonsillar fossa (HCC) C09.0     Stage IVA ([id number]) - HPV + - Squamous cell carcinoma of the right tonsil 08/28/15 - 10/16/15: 1. The Right tonsil and bilateral neck were treated PTV High to 70 Gy in 35 fractions at 2 Gy per fraction. 2. The Right tonsil and bilateral neck were treated PTV Med to 63 Gy in 35 fractions at 1.8 Gy per fraction. 3. The Right tonsil and bilateral neck were treated PTV Low to 56 Gy in 35 fractions at 1.6 Gy per fraction.  Chief Complaint: Follow up of tonsillar cancer  Narrative:  The patient returns today for routine follow-up of radiation completed 10/16/15 to his Right Tonsil and bilateral neck.   Most recent PET on 07/04/2016 showed no evidence of local recurrence within the neck and no evidence of distant metastatic disease.  Last seen by Dr. Hezzie Bump on 03/25/2017 and scheduled to see him again in July. He saw Emeline Gins NP yesterday in med onc.  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 He reports pain an 8 when chewing food. He has a history of this prior to radiation and states it actually got better with radiation treatment. However, it did get worse after his feeding tube was removed and he began oral intake. His feeding tube was removed 12/18. He "has to pay attention when swallowing". He is able to eat most foods. Denies smoking or chewing tobacco. He is not using fluoride trays daily. He reports dizziness that happens at various times. He could be sitting, walking, or doing other activities. Dr. Myna Hidalgo has checked an [place] of his carotid arteries which was negative.    ALLERGIES:  is allergic to  lisinopril; acyclovir and related; and famciclovir.  Meds: Current Outpatient Medications  Medication Sig Dispense Refill  . dexamethasone (DECADRON) 0.5 MG/5ML solution     . MULTIPLE MINERALS PO Take by mouth.    . tamsulosin (FLOMAX) 0.4 MG CAPS capsule     . terazosin (HYTRIN) 2 MG capsule Take 2 mg by mouth at bedtime.    . lidocaine (XYLOCAINE) 2 % solution      No current facility-administered medications for this encounter.     Physical Findings: The patient is in no acute distress. Patient is alert and oriented. Wt Readings from Last 3 Encounters:  06/20/17 193 lb (87.5 kg)  06/19/17 191 lb (86.6 kg)  02/20/17 189 lb (85.7 kg)    height is 6' (1.829 m) and weight is 193 lb (87.5 kg). His temperature is 98.3 F (36.8 C). His blood pressure is 142/63 (abnormal) and his pulse is 57 (abnormal). .  General: Alert and oriented, in no acute distress. HEENT:  No evidence of tumor in the oral pharynx. Along the buccal mucosa, particularly on the left side, he has superficial ulcerative lesions and similar lesions along the bilateral lateral aspects of the tongue.  Neck: No palpable cervical or supraclavicular adenopathy or masses. He has a modest amount of firm lymphadema in the anterior of the neck. Skin : healed well over neck Heart: RRR no murmurs Chest: CTAB Ext:  no edema MSK:   good strength in all extremities Lymphatics: see Neck Exam Psychiatric: Judgment and insight are intact. Affect is appropriate.  Lab Findings: Lab Results  Component Value Date   WBC 4.7 06/19/2017   HGB 13.3 02/20/2017   HCT 41.9 06/19/2017   MCV 91.7 06/19/2017   PLT 185 06/19/2017    Lab Results  Component Value Date   TSH 1.091 06/20/2017    Radiographic Findings: No results found.  Impression/Plan:    1) Head and Neck Cancer Status: NED   2) Nutritional Status: Reports chewing difficulty secondary to lichen planus. Wt Readings from Last 3 Encounters:  06/20/17 193 lb (87.5  kg)  06/19/17 191 lb (86.6 kg)  02/20/17 189 lb (85.7 kg)     3) Risk Factors: The patient has been educated about risk factors including alcohol and tobacco abuse; they understand that avoidance of alcohol and tobacco is important to prevent recurrences as well as other cancers.     4) Swallowing: He "has to pay attention when swallowing". He is able to eat most foods.  5) Dental: Urged to obtain followup with dentistry, and dental hygiene including fluoride rinses. Also encouraged the patient to ask about prescription strength fluoride toothpaste at his next dental visit.  6) Thyroid function: TSH checked today, we will await these results.(Later found to be normal).  Continue to check every 6-12 months. Lab Results  Component Value Date   TSH 1.091 06/20/2017    7)  Follow up with Dr. Hezzie Bump in 50mo. Will also see oral surgeon re: ongoing lichen planus management. The patient will follow up with me in 1 year with TSH. Continue f/u w/ med/onc as scheduled.    ______________________________________   Lonie Peak, MD   This document serves as a record of services personally performed by Lonie Peak, MD. It was created on her behalf by Delana Meyer, a trained medical scribe. The creation of this record is based on the scribe's personal observations and the provider's statements to them. This document has been checked and approved by the attending provider.

## 2017-06-21 ENCOUNTER — Other Ambulatory Visit: Payer: Self-pay | Admitting: Radiation Oncology

## 2017-06-21 ENCOUNTER — Encounter: Payer: Self-pay | Admitting: Radiation Oncology

## 2017-06-21 DIAGNOSIS — Z1329 Encounter for screening for other suspected endocrine disorder: Secondary | ICD-10-CM

## 2017-06-23 ENCOUNTER — Telehealth: Payer: Self-pay | Admitting: *Deleted

## 2017-06-23 NOTE — Telephone Encounter (Signed)
CALLED PATIENT TO INFORM OF LAB AND FU ON 06-26-18 - 10 AM - LAB AND FU WITH DR. Isidore Moos TO FOLLOW THE LAB APPT., LVM FOR A RETURN CALL

## 2017-10-14 DIAGNOSIS — Z08 Encounter for follow-up examination after completed treatment for malignant neoplasm: Secondary | ICD-10-CM | POA: Diagnosis not present

## 2017-10-14 DIAGNOSIS — Z9221 Personal history of antineoplastic chemotherapy: Secondary | ICD-10-CM | POA: Diagnosis not present

## 2017-10-14 DIAGNOSIS — Z8589 Personal history of malignant neoplasm of other organs and systems: Secondary | ICD-10-CM | POA: Diagnosis not present

## 2017-10-14 DIAGNOSIS — Z85818 Personal history of malignant neoplasm of other sites of lip, oral cavity, and pharynx: Secondary | ICD-10-CM | POA: Diagnosis not present

## 2017-10-14 DIAGNOSIS — Z7189 Other specified counseling: Secondary | ICD-10-CM | POA: Diagnosis not present

## 2017-10-14 DIAGNOSIS — Z923 Personal history of irradiation: Secondary | ICD-10-CM | POA: Diagnosis not present

## 2017-10-20 ENCOUNTER — Other Ambulatory Visit: Payer: Self-pay

## 2017-10-20 ENCOUNTER — Inpatient Hospital Stay (HOSPITAL_BASED_OUTPATIENT_CLINIC_OR_DEPARTMENT_OTHER): Payer: Medicare Other | Admitting: Hematology & Oncology

## 2017-10-20 ENCOUNTER — Encounter: Payer: Self-pay | Admitting: Hematology & Oncology

## 2017-10-20 ENCOUNTER — Inpatient Hospital Stay: Payer: Medicare Other | Attending: Hematology & Oncology

## 2017-10-20 VITALS — BP 130/70 | HR 57 | Temp 98.0°F | Resp 16 | Wt 197.0 lb

## 2017-10-20 DIAGNOSIS — C099 Malignant neoplasm of tonsil, unspecified: Secondary | ICD-10-CM | POA: Diagnosis not present

## 2017-10-20 DIAGNOSIS — Z1329 Encounter for screening for other suspected endocrine disorder: Secondary | ICD-10-CM

## 2017-10-20 DIAGNOSIS — I10 Essential (primary) hypertension: Secondary | ICD-10-CM

## 2017-10-20 DIAGNOSIS — L439 Lichen planus, unspecified: Secondary | ICD-10-CM | POA: Insufficient documentation

## 2017-10-20 LAB — CMP (CANCER CENTER ONLY)
ALT: 12 U/L (ref 0–44)
AST: 17 U/L (ref 15–41)
Albumin: 3.8 g/dL (ref 3.5–5.0)
Alkaline Phosphatase: 53 U/L (ref 38–126)
Anion gap: 10 (ref 5–15)
BUN: 16 mg/dL (ref 8–23)
CO2: 24 mmol/L (ref 22–32)
CREATININE: 1.08 mg/dL (ref 0.61–1.24)
Calcium: 9.7 mg/dL (ref 8.9–10.3)
Chloride: 105 mmol/L (ref 98–111)
GFR, Est AFR Am: >60 mL/min (ref >60)
GFR, Estimated: >60 mL/min (ref >60)
GLUCOSE: 114 mg/dL — AB (ref 70–99)
Potassium: 4.1 mmol/L (ref 3.5–5.1)
SODIUM: 139 mmol/L (ref 135–145)
Total Bilirubin: 0.4 mg/dL (ref 0.3–1.2)
Total Protein: 6.6 g/dL (ref 6.5–8.1)

## 2017-10-20 LAB — CBC WITH DIFFERENTIAL (CANCER CENTER ONLY)
BASOS ABS: 0 10*3/uL (ref 0.0–0.1)
Basophils Relative: 0 %
EOS ABS: 0 10*3/uL (ref 0.0–0.5)
EOS PCT: 1 %
HCT: 43.7 % (ref 38.7–49.9)
Hemoglobin: 14.6 g/dL (ref 13.0–17.1)
LYMPHS PCT: 6 %
Lymphs Abs: 0.5 10*3/uL — ABNORMAL LOW (ref 0.9–3.3)
MCH: 32 pg (ref 28.0–33.4)
MCHC: 33.4 g/dL (ref 32.0–35.9)
MCV: 95.8 fL (ref 82.0–98.0)
Monocytes Absolute: 0.5 10*3/uL (ref 0.1–0.9)
Monocytes Relative: 7 %
NEUTROS PCT: 86 %
Neutro Abs: 7.1 10*3/uL — ABNORMAL HIGH (ref 1.5–6.5)
Platelet Count: 175 10*3/uL (ref 145–400)
RBC: 4.56 MIL/uL (ref 4.20–5.70)
RDW: 13 % (ref 11.1–15.7)
WBC: 8.2 10*3/uL (ref 4.0–10.0)

## 2017-10-20 LAB — LACTATE DEHYDROGENASE: LDH: 148 U/L (ref 98–192)

## 2017-10-20 NOTE — Progress Notes (Signed)
Hematology and Oncology Follow Up Visit  Randy Hanson [id number] 1940/12/25 77 y.o. 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  He looks fantastic.  His feeding tube is out.  He is eating pretty well.  He still has a little bit of a tough time with lean meat.  What is interesting now is that he has lichen planus in his mouth.  I have never heard of lichen planus of the mouth.  However, he has it.  He sees oral surgery for it.  He is getting some mouth rinses and is on 2 mg of prednisone a day.  He still has a little bit of dizziness.  We did do carotid Dopplers.  Everything looked fine without any blockages.  He is seeing his family doctor to see if he can help.  He has had no fever.  He has had no bleeding.  He has had a little bit of constipation.  He takes MiraLAX once a week.  Overall, his performance status is ECOG 1.      Medications:  Allergies as of 10/20/2017      Reactions   Lisinopril Cough   Acyclovir And Related Other (See Comments)   Fever, chills   Famciclovir Other (See Comments)   Fever, chills      Medication List        Accurate as of 10/20/17  1:19 PM. Always use your most recent med list.          [facility] CLORHEXACIN EX Apply topically.   dexamethasone 0.5 MG/5ML solution Commonly known as:  DECADRON   lidocaine 2 % solution Commonly known as:  XYLOCAINE   MULTIPLE MINERALS PO Take by mouth.   predniSONE 10 MG tablet Commonly known as:  DELTASONE Take 10 mg by mouth daily with breakfast.   tamsulosin 0.4 MG Caps capsule Commonly known as:  FLOMAX   terazosin 2 MG capsule Commonly known as:  HYTRIN Take 2 mg by mouth at bedtime.       Allergies:  Allergies  Allergen Reactions  . Lisinopril Cough  . Acyclovir And  Related Other (See Comments)    Fever, chills   . Famciclovir Other (See Comments)    Fever, chills    Past Medical History, Surgical history, Social history, and Family History were reviewed and updated.  Review of Systems: As stated in the interim history  Physical Exam:  weight is 197 lb (89.4 kg). His oral temperature is 98 F (36.7 C). His blood pressure is 130/70 and his pulse is 57 (abnormal). His respiration is 16 and oxygen saturation is 100%.   Wt Readings from Last 3 Encounters:  10/20/17 197 lb (89.4 kg)  06/20/17 193 lb (87.5 kg)  06/19/17 191 lb (86.6 kg)    Physical Exam  Constitutional: He is oriented to person, place, and time.  HENT:  Head: Normocephalic and atraumatic.  Mouth/Throat: Oropharynx is clear and moist.  He does have some ulcerations on the roof of his mouth.  They seem to be on the lateral aspects of the hard palate.  They are white ulcerations.  Eyes: Pupils are equal, round, and reactive to light. EOM are normal.  Neck: Normal range of motion.  Cardiovascular: Normal rate, regular rhythm  and normal heart sounds.  Pulmonary/Chest: Effort normal and breath sounds normal.  Abdominal: Soft. Bowel sounds are normal.  Musculoskeletal: Normal range of motion. He exhibits no edema, tenderness or deformity.  Lymphadenopathy:    He has no cervical adenopathy.  Neurological: He is alert and oriented to person, place, and time.  Skin: Skin is warm and dry. No rash noted. No erythema.  Psychiatric: He has a normal mood and affect. His behavior is normal. Judgment and thought content normal.  Vitals reviewed.    Lab Results  Component Value Date   WBC 8.2 10/20/2017   HGB 14.6 10/20/2017   HCT 43.7 10/20/2017   MCV 95.8 10/20/2017   PLT 175 10/20/2017   No results found for: FERRITIN, IRON, TIBC, UIBC, IRONPCTSAT Lab Results  Component Value Date   RBC 4.56 10/20/2017   No results found for: KPAFRELGTCHN, LAMBDASER, KAPLAMBRATIO No results  found for: IGGSERUM, IGA, IGMSERUM No results found for: Marda Stalker, SPEI   Chemistry      Component Value Date/Time   NA 140 06/19/2017 1203   NA 146 (H) 02/20/2017 1007   NA 140 07/04/2016 1014   K 4.5 06/19/2017 1203   K 4.7 02/20/2017 1007   K 4.7 07/04/2016 1014   CL 105 06/19/2017 1203   CL 104 02/20/2017 1007   CO2 27 06/19/2017 1203   CO2 31 02/20/2017 1007   CO2 27 07/04/2016 1014   BUN 18 06/19/2017 1203   BUN 14 02/20/2017 1007   BUN 22.1 07/04/2016 1014   CREATININE 1.15 06/19/2017 1203   CREATININE 1.1 02/20/2017 1007   CREATININE 1.0 07/04/2016 1014      Component Value Date/Time   CALCIUM 9.9 06/19/2017 1203   CALCIUM 9.8 02/20/2017 1007   CALCIUM 10.6 (H) 07/04/2016 1014   ALKPHOS 75 06/19/2017 1203   ALKPHOS 72 02/20/2017 1007   ALKPHOS 255 (H) 07/04/2016 1014   AST 21 06/19/2017 1203   AST 28 07/04/2016 1014   ALT 13 06/19/2017 1203   ALT 17 02/20/2017 1007   ALT 55 07/04/2016 1014   BILITOT 0.5 06/19/2017 1203   BILITOT 0.80 07/04/2016 1014     Impression and Plan: Randy Hanson is a very pleasant 47- yo white male with stage I  HPV+ - squamous cell carcinoma the right tonsil. He completed radiation and chemotherapy in early August, 2017. By his PET scan in April, he is in remission.  We will get him back now in 6 months.  I really do not believe that his cancer will come back.  Given that he is HPV positive, I think that he has responded as I expected to treatment.  Hopefully, the lichen planus will be treated.  I guess it cannot be cured.  [name], MD 8/12/20191:19 PM

## 2017-10-21 LAB — TSH: TSH: 0.72 u[IU]/mL (ref 0.320–4.118)

## 2017-12-03 DIAGNOSIS — Z0000 Encounter for general adult medical examination without abnormal findings: Secondary | ICD-10-CM | POA: Diagnosis not present

## 2017-12-03 DIAGNOSIS — R42 Dizziness and giddiness: Secondary | ICD-10-CM | POA: Diagnosis not present

## 2017-12-03 DIAGNOSIS — R351 Nocturia: Secondary | ICD-10-CM | POA: Diagnosis not present

## 2017-12-03 DIAGNOSIS — Z125 Encounter for screening for malignant neoplasm of prostate: Secondary | ICD-10-CM | POA: Diagnosis not present

## 2017-12-03 DIAGNOSIS — N401 Enlarged prostate with lower urinary tract symptoms: Secondary | ICD-10-CM | POA: Diagnosis not present

## 2017-12-03 DIAGNOSIS — Z23 Encounter for immunization: Secondary | ICD-10-CM | POA: Diagnosis not present

## 2017-12-03 DIAGNOSIS — C099 Malignant neoplasm of tonsil, unspecified: Secondary | ICD-10-CM | POA: Diagnosis not present

## 2017-12-03 DIAGNOSIS — K219 Gastro-esophageal reflux disease without esophagitis: Secondary | ICD-10-CM | POA: Diagnosis not present

## 2017-12-03 DIAGNOSIS — I10 Essential (primary) hypertension: Secondary | ICD-10-CM | POA: Diagnosis not present

## 2017-12-03 DIAGNOSIS — Z1389 Encounter for screening for other disorder: Secondary | ICD-10-CM | POA: Diagnosis not present

## 2017-12-03 DIAGNOSIS — E559 Vitamin D deficiency, unspecified: Secondary | ICD-10-CM | POA: Diagnosis not present

## 2018-03-03 IMAGING — XA IR US GUIDE VASC ACCESS LEFT
1 series · 1 of 1 positions shown · non-contrast
Comparison: none

INDICATION: Head and neck cancer

[Series 1: fl (-) angio · 1 of 1 slices shown]
[im 1/1]
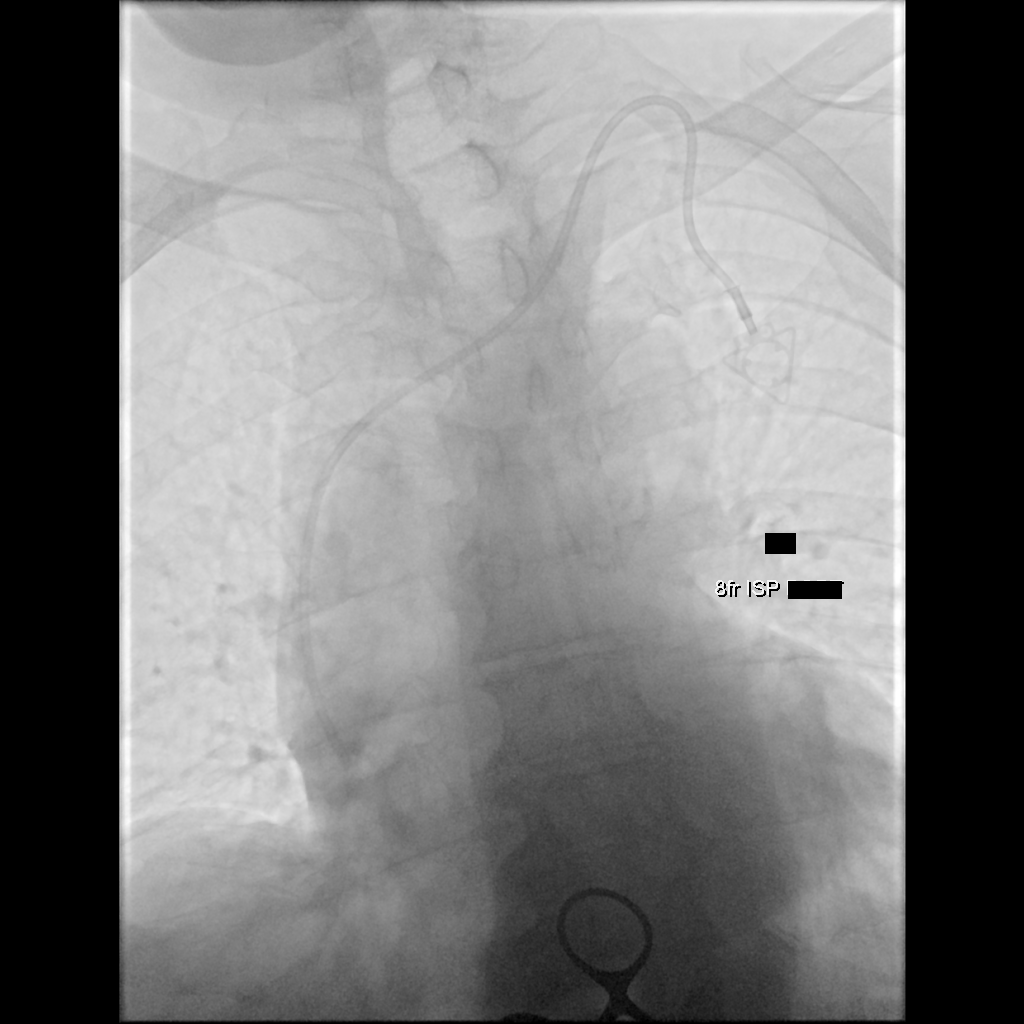

[1 of 1 positions shown; findings below may reference images not displayed]

EXAM:
IMPLANTED PORT A CATH PLACEMENT WITH ULTRASOUND AND FLUOROSCOPIC
GUIDANCE

MEDICATIONS:
Two; The antibiotic was administered within an appropriate time
interval prior to skin puncture.

ANESTHESIA/SEDATION:
Moderate (conscious) sedation was employed during this procedure. A
total of Versed 2 mg and Fentanyl 100 mcg was administered
intravenously.

Moderate Sedation Time: 26 minutes. The patient's level of
consciousness and vital signs were monitored continuously by
radiology nursing throughout the procedure under my direct
supervision.

FLUOROSCOPY TIME:  One minutes, 0 seconds (7 mGy)

COMPLICATIONS:
None immediate.

PROCEDURE:
The procedure, risks, benefits, and alternatives were explained to
the patient. Questions regarding the procedure were encouraged and
answered. The patient understands and consents to the procedure.

The left neck and chest were prepped with chlorhexidine in a sterile
fashion, and a sterile drape was applied covering the operative
field. Maximum barrier sterile technique with sterile gowns and
gloves were used for the procedure. A timeout was performed prior to
the initiation of the procedure. Local anesthesia was provided with
1% lidocaine with epinephrine.

After creating a small venotomy incision, a micropuncture kit was
utilized to access the internal jugular vein under direct, real-time
ultrasound guidance. Ultrasound image documentation was performed.
The microwire was kinked to measure appropriate catheter length.

A subcutaneous port pocket was then created along the upper chest
wall utilizing a combination of sharp and blunt dissection. The
pocket was irrigated with sterile saline. A single lumen ISP power
injectable port was chosen for placement. The 8 Fr catheter was
tunneled from the port pocket site to the venotomy incision. The
port was placed in the pocket. 3-0 Ethilon stitches were utilized to
secure the reservoir in the pocket. The external catheter was
trimmed to appropriate length. At the venotomy, an 8 Fr peel-away
sheath was placed over a guidewire under fluoroscopic guidance. The
catheter was then placed through the sheath and the sheath was
removed. Final catheter positioning was confirmed and documented
with a fluoroscopic spot radiograph. The port was accessed with Avom
Abiy needle, aspirated and flushed with heparinized saline.

The venotomy site was closed with an interrupted 4-0 Vicryl suture.
The port pocket incision was closed with interrupted 2-0 Vicryl
suture and the skin was opposed with a running subcuticular 4-0
Vicryl suture. Dermabond and Denis Ariel were applied to both
incisions. Dressings were placed. The patient tolerated the
procedure well without immediate post procedural complication.
IMPRESSION: Successful placement of a left internal jugular approach power
injectable Port-A-Cath. The catheter is ready for immediate use.

## 2018-04-27 ENCOUNTER — Inpatient Hospital Stay: Payer: Medicare Other

## 2018-04-27 ENCOUNTER — Inpatient Hospital Stay: Payer: Medicare Other | Attending: Hematology & Oncology | Admitting: Hematology & Oncology

## 2018-04-27 ENCOUNTER — Other Ambulatory Visit: Payer: Self-pay

## 2018-04-27 VITALS — BP 139/75 | HR 65 | Temp 97.6°F | Resp 16 | Wt 203.0 lb

## 2018-04-27 DIAGNOSIS — Z9221 Personal history of antineoplastic chemotherapy: Secondary | ICD-10-CM

## 2018-04-27 DIAGNOSIS — E039 Hypothyroidism, unspecified: Secondary | ICD-10-CM

## 2018-04-27 DIAGNOSIS — I10 Essential (primary) hypertension: Secondary | ICD-10-CM

## 2018-04-27 DIAGNOSIS — Z923 Personal history of irradiation: Secondary | ICD-10-CM | POA: Diagnosis not present

## 2018-04-27 DIAGNOSIS — C099 Malignant neoplasm of tonsil, unspecified: Secondary | ICD-10-CM | POA: Insufficient documentation

## 2018-04-27 LAB — CMP (CANCER CENTER ONLY)
ALBUMIN: 4.4 g/dL (ref 3.5–5.0)
ALK PHOS: 65 U/L (ref 38–126)
ALT: 11 U/L (ref 0–44)
ANION GAP: 8 (ref 5–15)
AST: 17 U/L (ref 15–41)
BILIRUBIN TOTAL: 0.5 mg/dL (ref 0.3–1.2)
BUN: 15 mg/dL (ref 8–23)
CALCIUM: 9.9 mg/dL (ref 8.9–10.3)
CO2: 30 mmol/L (ref 22–32)
CREATININE: 1.07 mg/dL (ref 0.61–1.24)
Chloride: 101 mmol/L (ref 98–111)
GFR, Estimated: >60 mL/min (ref >60)
Glucose, Bld: 119 mg/dL — ABNORMAL HIGH (ref 70–99)
Potassium: 4.1 mmol/L (ref 3.5–5.1)
Sodium: 139 mmol/L (ref 135–145)
TOTAL PROTEIN: 6.6 g/dL (ref 6.5–8.1)

## 2018-04-27 LAB — CBC WITH DIFFERENTIAL (CANCER CENTER ONLY)
Abs Immature Granulocytes: 0.02 10*3/uL (ref 0.00–0.07)
BASOS ABS: 0 10*3/uL (ref 0.0–0.1)
Basophils Relative: 1 %
Eosinophils Absolute: 0.1 10*3/uL (ref 0.0–0.5)
Eosinophils Relative: 1 %
HEMATOCRIT: 44.8 % (ref 39.0–52.0)
HEMOGLOBIN: 15 g/dL (ref 13.0–17.0)
IMMATURE GRANULOCYTES: 0 %
LYMPHS ABS: 0.6 10*3/uL — AB (ref 0.7–4.0)
LYMPHS PCT: 9 %
MCH: 31.5 pg (ref 26.0–34.0)
MCHC: 33.5 g/dL (ref 30.0–36.0)
MCV: 94.1 fL (ref 80.0–100.0)
MONOS PCT: 9 %
Monocytes Absolute: 0.6 10*3/uL (ref 0.1–1.0)
NEUTROS PCT: 80 %
NRBC: 0 % (ref 0.0–0.2)
Neutro Abs: 5 10*3/uL (ref 1.7–7.7)
Platelet Count: 186 10*3/uL (ref 150–400)
RBC: 4.76 MIL/uL (ref 4.22–5.81)
RDW: 12.6 % (ref 11.5–15.5)
WBC Count: 6.3 10*3/uL (ref 4.0–10.5)

## 2018-04-27 NOTE — Progress Notes (Signed)
Hematology and Oncology Follow Up Visit  Randy Hanson [id number] 09-02-1940 78 y.o. 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  He looks fantastic.  We last saw him 6 months ago.  He had [place] no problems over the holiday season.  He is eating better.  His mouth is still little bit dry.  However, he can swallow pretty much what he likes.  He has had no problems with his thyroid.  We have to be very careful with his hypothyroidism.  He may have hypothyroidism from his radiation therapy.     He is having no problems with his bowels or bladder.  He had [birth year] birthday back in November.  He had a really nice birthday party.  He is try to help his son.  His son had colon cancer surgery.  Thankfully, it sounds like he had stage II colon cancer and did not require any adjuvant chemotherapy.  Currently, he has had no chest wall pain.  He has had no nausea or vomiting.  He has had no headaches.  He sees a doctor at [facility] for his lichen planus in his mouth.  He is on Decadron mouth rinse along with chlorhexidine mouth rinse.  This seems to be working quite well.  Overall, his performance status is ECOG 1.      Medications:  Allergies as of 04/27/2018      Reactions   Lisinopril Cough   Acyclovir And Related Other (See Comments)   Fever, chills   Famciclovir Other (See Comments)   Fever, chills      Medication List       Accurate as of April 27, 2018  2:18 PM. Always use your most recent med list.        chlorhexidine 0.12 % solution Commonly known as:  PERIDEX Use as directed 15 mLs in the mouth or throat 2 (two) times daily. Mouth rinse   dexamethasone 0.5 MG/5ML solution Commonly known as:  DECADRON Take by mouth daily. Mouth rinse   lidocaine  2 % solution Commonly known as:  XYLOCAINE   predniSONE 10 MG tablet Commonly known as:  DELTASONE Take 10 mg by mouth daily with breakfast.   terazosin 2 MG capsule Commonly known as:  HYTRIN Take 2 mg by mouth at bedtime.       Allergies:  Allergies  Allergen Reactions  . Lisinopril Cough  . Acyclovir And Related Other (See Comments)    Fever, chills   . Famciclovir Other (See Comments)    Fever, chills    Past Medical History, Surgical history, Social history, and Family History were reviewed and updated.  Review of Systems: As stated in the interim history  Physical Exam:  weight is 203 lb (92.1 kg). His oral temperature is 97.6 F (36.4 C). His blood pressure is 139/75 and his pulse is 65. His respiration is 16 and oxygen saturation is 97%.   Wt Readings from Last 3 Encounters:  04/27/18 203 lb (92.1 kg)  10/20/17 197 lb (89.4 kg)  06/20/17 193 lb (87.5 kg)    Physical Exam Vitals signs reviewed.  HENT:     Head: Normocephalic and atraumatic.  Eyes:     Pupils: Pupils are equal, round, and reactive to  light.  Neck:     Musculoskeletal: Normal range of motion.  Cardiovascular:     Rate and Rhythm: Normal rate and regular rhythm.     Heart sounds: Normal heart sounds.  Pulmonary:     Effort: Pulmonary effort is normal.     Breath sounds: Normal breath sounds.  Abdominal:     General: Bowel sounds are normal.     Palpations: Abdomen is soft.  Musculoskeletal: Normal range of motion.        General: No tenderness or deformity.  Lymphadenopathy:     Cervical: No cervical adenopathy.  Skin:    General: Skin is warm and dry.     Findings: No erythema or rash.  Neurological:     Mental Status: He is alert and oriented to person, place, and time.  Psychiatric:        Behavior: Behavior normal.        Thought Content: Thought content normal.        Judgment: Judgment normal.      Lab Results  Component Value Date   WBC 6.3 04/27/2018   HGB 15.0  04/27/2018   HCT 44.8 04/27/2018   MCV 94.1 04/27/2018   PLT 186 04/27/2018   No results found for: FERRITIN, IRON, TIBC, UIBC, IRONPCTSAT Lab Results  Component Value Date   RBC 4.76 04/27/2018   No results found for: KPAFRELGTCHN, LAMBDASER, KAPLAMBRATIO No results found for: IGGSERUM, IGA, IGMSERUM No results found for: Marda Stalker, SPEI   Chemistry      Component Value Date/Time   NA 139 04/27/2018 1130   NA 146 (H) 02/20/2017 1007   NA 140 07/04/2016 1014   K 4.1 04/27/2018 1130   K 4.7 02/20/2017 1007   K 4.7 07/04/2016 1014   CL 101 04/27/2018 1130   CL 104 02/20/2017 1007   CO2 30 04/27/2018 1130   CO2 31 02/20/2017 1007   CO2 27 07/04/2016 1014   BUN 15 04/27/2018 1130   BUN 14 02/20/2017 1007   BUN 22.1 07/04/2016 1014   CREATININE 1.07 04/27/2018 1130   CREATININE 1.1 02/20/2017 1007   CREATININE 1.0 07/04/2016 1014      Component Value Date/Time   CALCIUM 9.9 04/27/2018 1130   CALCIUM 9.8 02/20/2017 1007   CALCIUM 10.6 (H) 07/04/2016 1014   ALKPHOS 65 04/27/2018 1130   ALKPHOS 72 02/20/2017 1007   ALKPHOS 255 (H) 07/04/2016 1014   AST 17 04/27/2018 1130   AST 28 07/04/2016 1014   ALT 11 04/27/2018 1130   ALT 17 02/20/2017 1007   ALT 55 07/04/2016 1014   BILITOT 0.5 04/27/2018 1130   BILITOT 0.80 07/04/2016 1014     Impression and Plan: Randy Hanson is a very pleasant 77- yo white male with stage I  HPV+ - squamous cell carcinoma the right tonsil. He completed radiation and chemotherapy in early August, 2017. By his PET scan in April, he is in remission.  We will get him back now in 6 months.  I really do not believe that his cancer will come back.  Given that he is HPV positive, I think that he has responded as I expected to treatment.   Randy Macho, MD 2/17/20202:18 PM

## 2018-04-29 LAB — TSH: TSH: 1.342 u[IU]/mL (ref 0.320–4.118)

## 2018-06-23 ENCOUNTER — Telehealth: Payer: Self-pay | Admitting: *Deleted

## 2018-06-23 NOTE — Telephone Encounter (Signed)
CALLED PATIENT TO ALTER LAB AND FU FOR 06-26-18, SPOKE WITH PATIENT AND THEY AGREED TO COME IN FOR LABS AND FU ON 07-31-18

## 2018-06-26 ENCOUNTER — Ambulatory Visit: Payer: Medicare Other

## 2018-06-26 ENCOUNTER — Ambulatory Visit: Payer: No Typology Code available for payment source | Admitting: Radiation Oncology

## 2018-07-31 ENCOUNTER — Other Ambulatory Visit: Payer: Self-pay

## 2018-07-31 ENCOUNTER — Encounter: Payer: Self-pay | Admitting: Radiation Oncology

## 2018-07-31 ENCOUNTER — Ambulatory Visit
Admission: RE | Admit: 2018-07-31 | Discharge: 2018-07-31 | Disposition: A | Discharge status: Home / Self Care | Payer: Medicare Other | Source: Ambulatory Visit | Attending: Radiation Oncology | Admitting: Radiation Oncology

## 2018-07-31 ENCOUNTER — Telehealth: Payer: Self-pay | Admitting: *Deleted

## 2018-07-31 VITALS — BP 144/81 | HR 67 | Temp 97.6°F | Resp 18 | Ht 72.0 in | Wt 207.0 lb

## 2018-07-31 DIAGNOSIS — Z08 Encounter for follow-up examination after completed treatment for malignant neoplasm: Secondary | ICD-10-CM | POA: Diagnosis not present

## 2018-07-31 DIAGNOSIS — L439 Lichen planus, unspecified: Secondary | ICD-10-CM | POA: Diagnosis not present

## 2018-07-31 DIAGNOSIS — Z1329 Encounter for screening for other suspected endocrine disorder: Secondary | ICD-10-CM

## 2018-07-31 DIAGNOSIS — Z923 Personal history of irradiation: Secondary | ICD-10-CM | POA: Insufficient documentation

## 2018-07-31 DIAGNOSIS — Z85818 Personal history of malignant neoplasm of other sites of lip, oral cavity, and pharynx: Secondary | ICD-10-CM | POA: Insufficient documentation

## 2018-07-31 DIAGNOSIS — Z79899 Other long term (current) drug therapy: Secondary | ICD-10-CM | POA: Insufficient documentation

## 2018-07-31 DIAGNOSIS — C090 Malignant neoplasm of tonsillar fossa: Secondary | ICD-10-CM | POA: Diagnosis not present

## 2018-07-31 DIAGNOSIS — F17210 Nicotine dependence, cigarettes, uncomplicated: Secondary | ICD-10-CM | POA: Insufficient documentation

## 2018-07-31 DIAGNOSIS — R682 Dry mouth, unspecified: Secondary | ICD-10-CM | POA: Insufficient documentation

## 2018-07-31 DIAGNOSIS — C099 Malignant neoplasm of tonsil, unspecified: Secondary | ICD-10-CM

## 2018-07-31 LAB — TSH: TSH: 1.476 u[IU]/mL (ref 0.320–4.118)

## 2018-07-31 NOTE — Telephone Encounter (Signed)
CALLED PATIENT TO REMIND OF LAB FOR 07-31-18, SPOKE WITH PATIENT'S WIFE AND THEY ARE AWARE OF THIS APPT.

## 2018-07-31 NOTE — Patient Instructions (Signed)
Coronavirus (COVID-19) Are you at risk?  Are you at risk for the Coronavirus (COVID-19)?  To be considered HIGH RISK for Coronavirus (COVID-19), you have to meet the following criteria:  . Traveled to [place], [place], [place], [place] or [place]; or in the [place] to [place], [place], [place], or [place]; and have fever, cough, and shortness of breath within the last 2 weeks of travel OR . Been in close contact with a person diagnosed with COVID-19 within the last 2 weeks and have fever, cough, and shortness of breath . IF YOU DO NOT MEET THESE CRITERIA, YOU ARE CONSIDERED LOW RISK FOR COVID-19.  What to do if you are HIGH RISK for COVID-19?  . If you are having a medical emergency, call 911. . Seek medical care right away. Before you go to a doctor's office, urgent care or emergency department, call ahead and tell them about your recent travel, contact with someone diagnosed with COVID-19, and your symptoms. You should receive instructions from your physician's office regarding next steps of care.  . When you arrive at healthcare provider, tell the healthcare staff immediately you have returned from visiting [place], [place], [place], [place] or [place]; or traveled in the [place] to [place], [place], [place], or [place]; in the last two weeks or you have been in close contact with a person diagnosed with COVID-19 in the last 2 weeks.   . Tell the health care staff about your symptoms: fever, cough and shortness of breath. . After you have been seen by a medical provider, you will be either: o Tested for (COVID-19) and discharged home on quarantine except to seek medical care if symptoms worsen, and asked to  - Stay home and avoid contact with others until you get your results (4-5 days)  - Avoid travel on public transportation if possible (such as bus, train, or airplane) or o Sent to the Emergency Department by EMS for evaluation, COVID-19 testing, and possible  admission depending on your condition and test results.  What to do if you are LOW RISK for COVID-19?  Reduce your risk of any infection by using the same precautions used for avoiding the common cold or flu:  . Wash your hands often with soap and warm water for at least 20 seconds.  If soap and water are not readily available, use an alcohol-based hand sanitizer with at least 60% alcohol.  . If coughing or sneezing, cover your mouth and nose by coughing or sneezing into the elbow areas of your shirt or coat, into a tissue or into your sleeve (not your hands). . Avoid shaking hands with others and consider head nods or verbal greetings only. . Avoid touching your eyes, nose, or mouth with unwashed hands.  . Avoid close contact with people who are sick. . Avoid places or events with large numbers of people in one location, like concerts or sporting events. . Carefully consider travel plans you have or are making. . If you are planning any travel outside or inside the [place], visit the CDC's Travelers' Health webpage for the latest health notices. . If you have some symptoms but not all symptoms, continue to monitor at home and seek medical attention if your symptoms worsen. . If you are having a medical emergency, call 911.   ADDITIONAL HEALTHCARE OPTIONS FOR PATIENTS  [facility] / e-Visit: [email or web address]         [facility]: [phone]  [facility]: [phone]                   [facility]: [phone]   

## 2018-07-31 NOTE — Progress Notes (Addendum)
Radiation Oncology         [phone] ________________________________  Name: Randy Hanson MRN: [id number]  Date: 07/31/2018  DOB: Aug 15, 1940  Follow-Up Visit Note - patient seen face to face today. OUTPATIENT   CC: [name], MD  [name], MD  Diagnosis and Prior Radiotherapy:       ICD-10-CM   1. Carcinoma of tonsillar fossa (HCC) C09.0     Stage IVA (I6N6EX5) - HPV + - Squamous cell carcinoma of the right tonsil 08/28/15 - 10/16/15: 1. The Right tonsil and bilateral neck were treated PTV High to 70 Gy in 35 fractions at 2 Gy per fraction. 2. The Right tonsil and bilateral neck were treated PTV Med to 63 Gy in 35 fractions at 1.8 Gy per fraction. 3. The Right tonsil and bilateral neck were treated PTV Low to 56 Gy in 35 fractions at 1.6 Gy per fraction.  Chief Complaint: Follow up of tonsillar cancer  Narrative:  The patient returns today for routine follow-up of radiation completed 10/16/15 to his Right Tonsil and bilateral neck.   Pain issues, if any: No Using a feeding tube?: N/A Weight changes, if any:  Wt Readings from Last 3 Encounters:  07/31/18 207 lb (93.9 kg)  04/27/18 203 lb (92.1 kg)  10/20/17 197 lb (89.4 kg)   Swallowing issues, if any: He needs to chew his food carefully while eating.  Smoking or chewing tobacco? No Using fluoride trays daily?  Last ENT visit was on: Dr. Hezzie Bump last on 10/14/17 Other notable issues, if any: He saw Dr. Myna Hidalgo last on 04/27/18 and is scheduled to see him again on 10/26/18.   He is using MW with alcohol in it. Has lichen planus (chronic).  Notes chronic dry mouth.  ALLERGIES:  is allergic to lisinopril; acyclovir and related; and famciclovir.  Meds: Current Outpatient Medications  Medication Sig Dispense Refill  . chlorhexidine (PERIDEX) 0.12 % solution Use as directed 15 mLs in the mouth or throat 2 (two) times daily. Mouth rinse    . predniSONE (DELTASONE) 10 MG tablet Take 10 mg by mouth daily with breakfast.     . terazosin (HYTRIN) 2 MG capsule Take 2 mg by mouth at bedtime.    Marland Kitchen dexamethasone (DECADRON) 0.5 MG/5ML solution Take by mouth daily. Mouth rinse    . lidocaine (XYLOCAINE) 2 % solution      No current facility-administered medications for this encounter.     Physical Findings: The patient is in no acute distress. Patient is alert and oriented. Wt Readings from Last 3 Encounters:  07/31/18 207 lb (93.9 kg)  04/27/18 203 lb (92.1 kg)  10/20/17 197 lb (89.4 kg)    height is 6' (1.829 m) and weight is 207 lb (93.9 kg). His temporal temperature is 97.6 F (36.4 C). His blood pressure is 144/81 (abnormal) and his pulse is 67. His respiration is 18 and oxygen saturation is 99%. .  General: Alert and oriented, in no acute distress. HEENT:  No evidence of tumor in the oropharynx. Along the buccal mucosa and ventral/lateral tongue his lichen planus is mild today Neck: No palpable cervical or supraclavicular adenopathy or masses. He has a modest amount of firm lymphedema in the anterior of the neck. Skin : healed well over neck, intact Psychiatric: Judgment and insight are intact. Affect is appropriate.  Lab Findings: Lab Results  Component Value Date   WBC 6.3 04/27/2018   HGB 15.0 04/27/2018   HCT 44.8 04/27/2018   MCV  94.1 04/27/2018   PLT 186 04/27/2018    Lab Results  Component Value Date   TSH 1.476 07/31/2018    Radiographic Findings: No results found.  Impression/Plan:    1) Head and Neck Cancer Status: NED   2) Nutritional Status: no issues Wt Readings from Last 3 Encounters:  07/31/18 207 lb (93.9 kg)  04/27/18 203 lb (92.1 kg)  10/20/17 197 lb (89.4 kg)     3) Risk Factors: The patient has been educated about risk factors including alcohol and tobacco abuse; they understand that avoidance of alcohol and tobacco is important to prevent recurrences as well as other cancers.     Advised to STOP mouthwash with alcohol, try ACT MW with fluoride instead.  4)  Swallowing: doing well.  He is able to eat most foods.  5) Dental: continue followup with dentistry, and dental hygiene including fluoride rinses. Also encouraged (again)  to ask about prescription strength fluoride toothpaste at his next dental visit.  6) Thyroid function: TSH WNL Lab Results  Component Value Date   TSH 1.476 07/31/2018    7)  Follow up with Dr. Hezzie Bump in 736mo - Young Berry, RN, our Head and Neck Oncology Navigator will arrange.  Dr Myna Hidalgo in 36mo. Will also see oral surgeon re: ongoing lichen planus management. The patient will follow up with me in 1 year.    8) Dry mouth - sips of H20, biotene, and Xylimelts recommended.  ______________________________________   Lonie Peak, MD

## 2018-07-31 NOTE — Progress Notes (Signed)
  [name] presents for follow up of radiation completed 10/16/15 to his right tonsil and bilateral neck.   Pain issues, if any: No Using a feeding tube?: N/A Weight changes, if any:  Wt Readings from Last 3 Encounters:  07/31/18 207 lb (93.9 kg)  04/27/18 203 lb (92.1 kg)  10/20/17 197 lb (89.4 kg)   Swallowing issues, if any: He needs to chew his food carefully while eating.  Smoking or chewing tobacco? No Using fluoride trays daily?  Last ENT visit was on: Dr. Nicolette Bang last on 10/14/17 Other notable issues, if any:   He saw Dr. Marin Olp last on 04/27/18 and is scheduled to see him again on 10/26/18.   BP (!) 144/81 (BP Location: Left Arm, Patient Position: Sitting)   Pulse 67   Temp 97.6 F (36.4 C) (Temporal)   Resp 18   Ht 6' (1.829 m)   Wt 207 lb (93.9 kg)   SpO2 99%   BMI 28.07 kg/m

## 2018-08-04 ENCOUNTER — Telehealth: Payer: Self-pay | Admitting: *Deleted

## 2018-08-04 NOTE — Telephone Encounter (Addendum)
Oncology Nurse Navigator Documentation  Per patient's 07/31/2018 post-treatment follow-up with Dr. Isidore Moos, sent secure e-mail to Johna Sheriff, Scheduler for Dr. Caesar Bookman [facility], requesting patient be contacted and scheduled for routine post-tmt follow-up with Dr. Nicolette Bang in November.  Noted he has follow-up with his medical oncologist in August.  Addendum 1338:  Rec'd e-mail from Johna Sheriff indicating pt's appt is scheduled for 01/26/2019.  Gayleen Orem, RN, BSN Head & Neck Oncology Nurse [facility] [place] [phone]

## 2018-08-08 IMAGING — RF DG SWALLOWING FUNCTION
9 series · 19 of 24 positions shown · non-contrast
Comparison: None.

CLINICAL DATA: History of head neck radiation.  Trouble swallowing.

EXAM:
MODIFIED BARIUM SWALLOW
TECHNIQUE: Different consistencies of barium were administered orally to the
patient by the Speech Pathologist. Imaging of the pharynx was
performed in the lateral projection.
FLUOROSCOPY TIME:  Fluoroscopy Time:  3 minutes
Radiation Exposure Index (if provided by the fluoroscopic device):
37.1 mGy air kerma
Number of Acquired Spot Images: 0

[Series 1: cp_standard · 0.34mm/px · 2 of 237 frames shown (1 of 9)]
[frame 17/237]
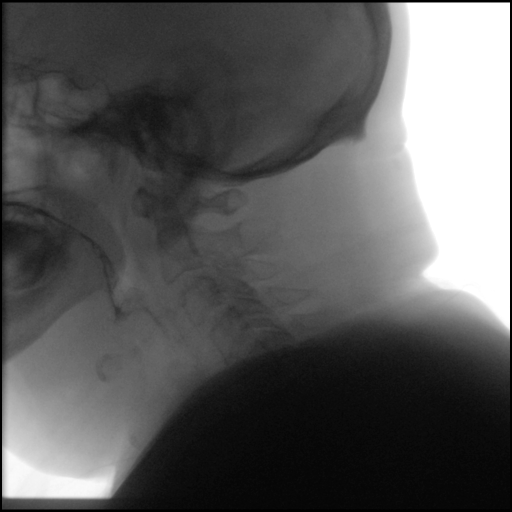
[frame 36/237]
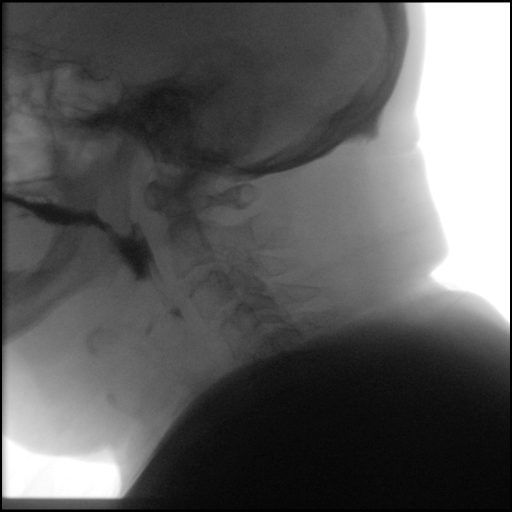

[Series 2: cp_standard · 0.34mm/px · 3 of 16 frames shown (2 of 9)]
[frame 1/16]
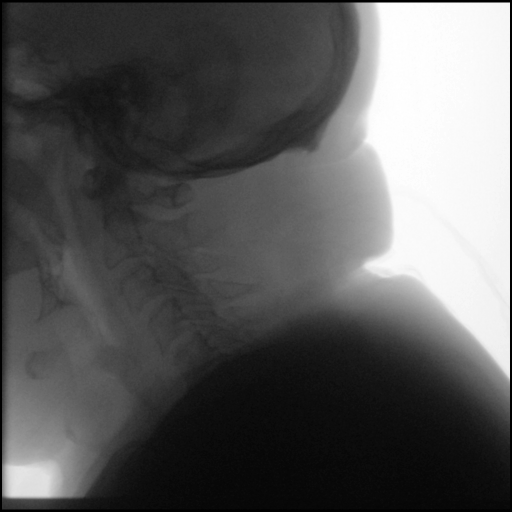
[frame 9/16]
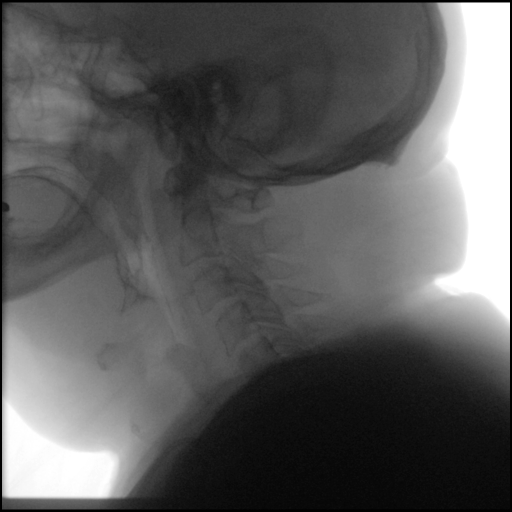
[frame 14/16]
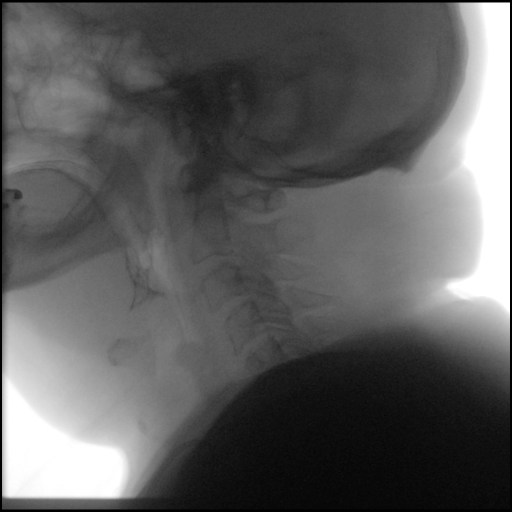

[Series 3: cp_standard · 0.34mm/px · 1 of 14 frames shown (3 of 9)]
[frame 8/14]
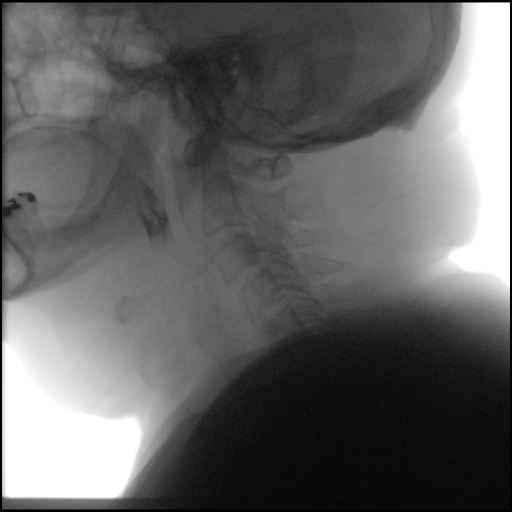

[Series 4: cp_standard · 0.34mm/px · 3 of 260 frames shown (4 of 9)]
[frame 18/260]
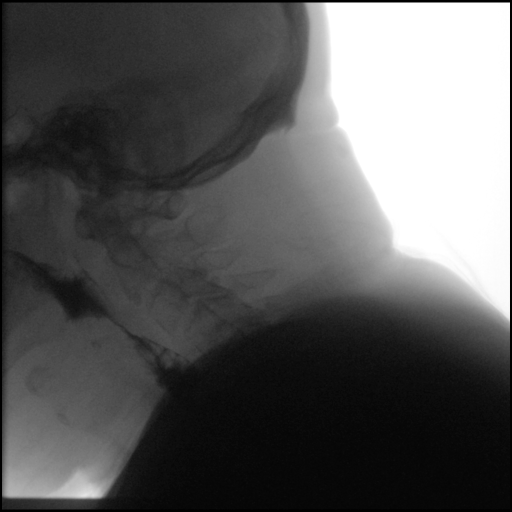
[frame 131/260]
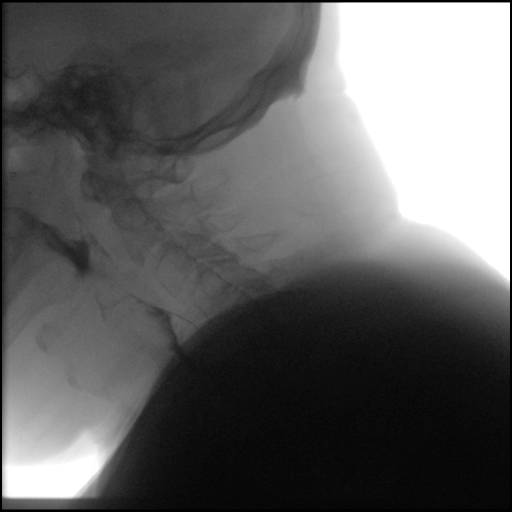
[frame 222/260]
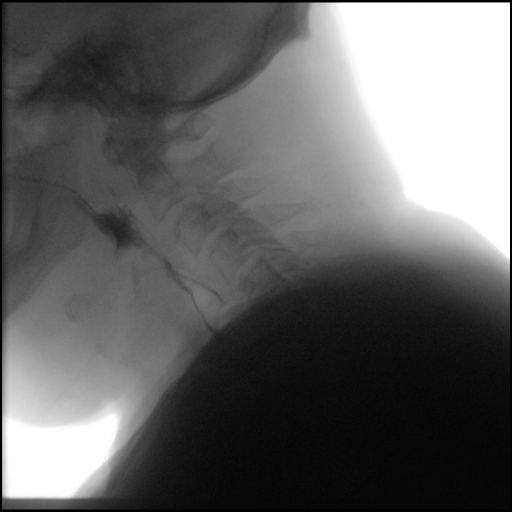

[Series 5: cp_standard · 0.34mm/px · 1 of 21 frames shown (5 of 9)]
[frame 11/21]
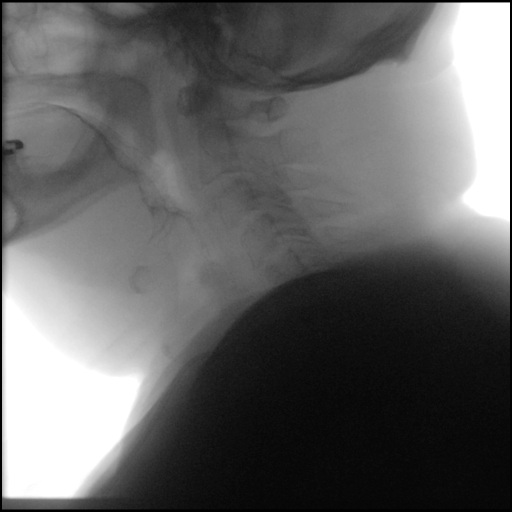

[Series 6: cp_standard · 0.34mm/px · 2 of 5 frames shown (6 of 9)]
[frame 1/5]
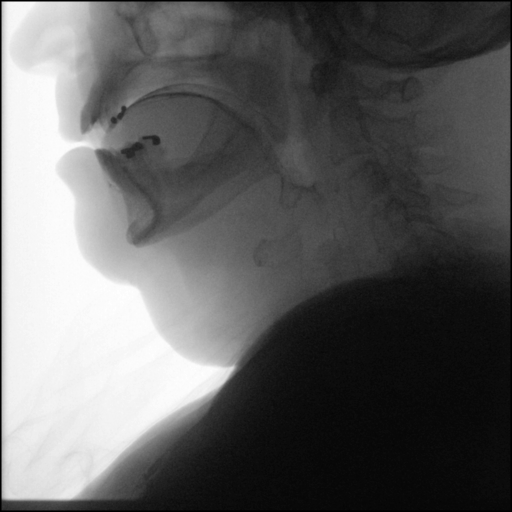
[frame 3/5]
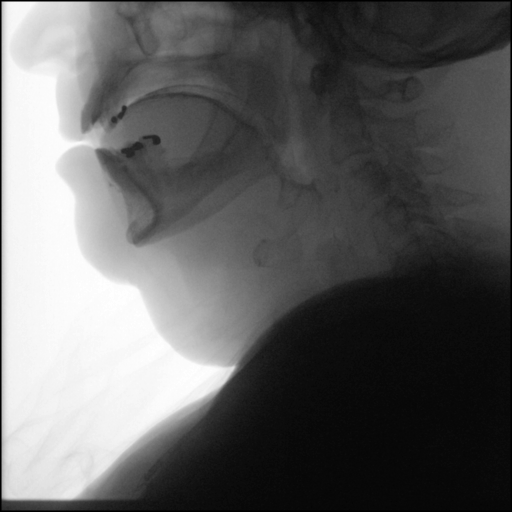

[Series 7: cp_standard · 0.34mm/px · 2 of 235 frames shown (7 of 9)]
[frame 36/235]
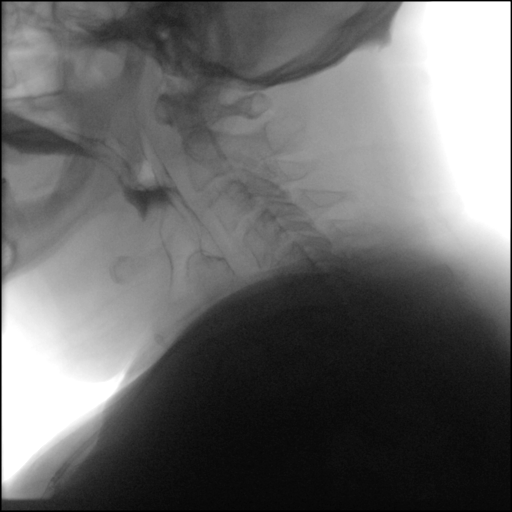
[frame 159/235]
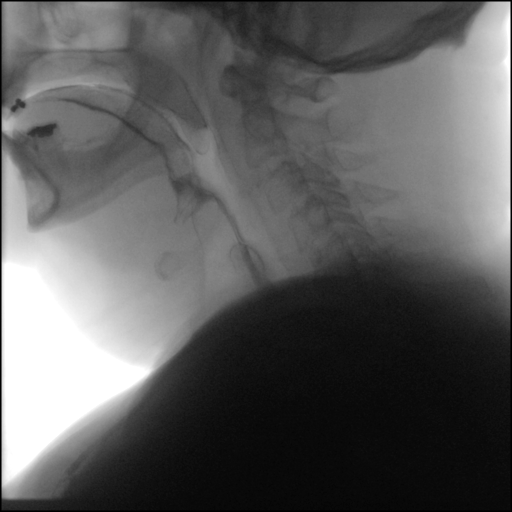

[Series 8: cp_standard · 0.38mm/px · 3 of 17 frames shown (8 of 9)]
[frame 1/17]
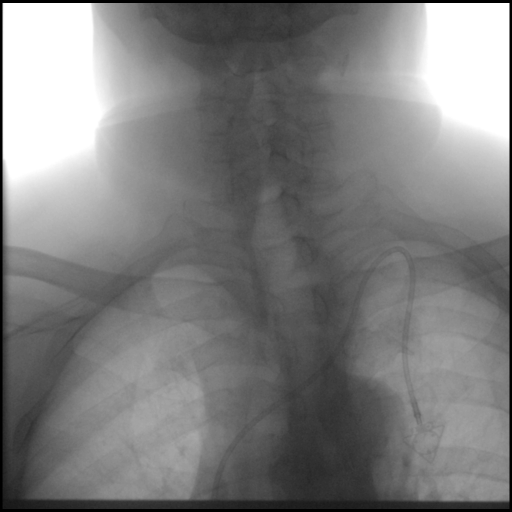
[frame 3/17]
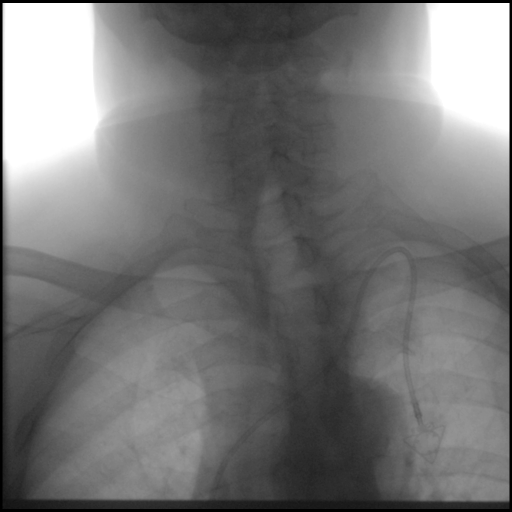
[frame 15/17]
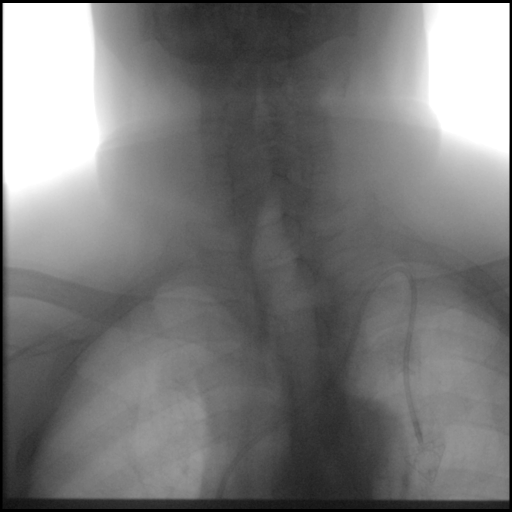

[Series 9: cp_standard · 0.38mm/px · 2 of 337 frames shown (9 of 9)]
[frame 169/337]
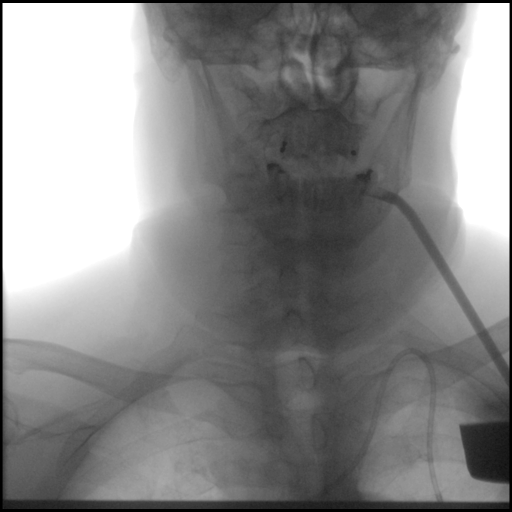
[frame 287/337]
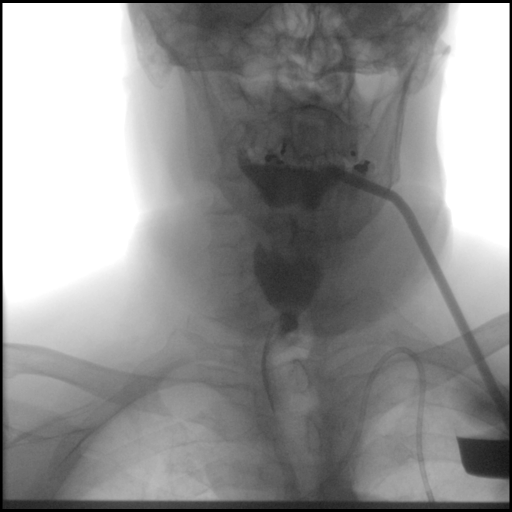

[19 of 24 positions shown; findings below may reference images not displayed]

FINDINGS: Barium medial with consistencies ranging from thin to puree were
provided by cup, spoon, and straw. Transient laryngeal penetration
was noted mainly with thin liquid, which the patient would
regurgitate to clear. Stasis was noted with all consistencies,
prompting spontaneous regurgitation to relieve patient the
"sticking" sensation. The stasis increased with increasing thickness
of the meal. Airway protection was good throughout the study and
aspiration was never definitely seen. Patient was concerned about a
stricture in the mid throat and patient was evaluated AP and
laterally for this purpose. Cricopharyngeus does cause of persistent
impression and moderate narrowing, but this is not the level of
patient's stasis, which is more the level of the oropharynx and
hypopharynx where thickened irradiated tissues distort the normal
anatomy. Attention to the esophageal verge on follow-up studies.
IMPRESSION: Abnormal pharyngeal function study as described. Please refer to the
Speech Pathologists report for complete details and recommendations.

## 2018-10-26 ENCOUNTER — Encounter: Payer: Self-pay | Admitting: Hematology & Oncology

## 2018-10-26 ENCOUNTER — Inpatient Hospital Stay: Payer: Medicare Other | Attending: Hematology & Oncology

## 2018-10-26 ENCOUNTER — Encounter (INDEPENDENT_AMBULATORY_CARE_PROVIDER_SITE_OTHER): Payer: Self-pay

## 2018-10-26 ENCOUNTER — Other Ambulatory Visit: Payer: Self-pay

## 2018-10-26 ENCOUNTER — Telehealth: Payer: Self-pay | Admitting: Hematology & Oncology

## 2018-10-26 ENCOUNTER — Inpatient Hospital Stay (HOSPITAL_BASED_OUTPATIENT_CLINIC_OR_DEPARTMENT_OTHER): Payer: Medicare Other | Admitting: Hematology & Oncology

## 2018-10-26 VITALS — BP 117/64 | HR 77 | Temp 97.8°F | Resp 18 | Wt 207.0 lb

## 2018-10-26 DIAGNOSIS — C099 Malignant neoplasm of tonsil, unspecified: Secondary | ICD-10-CM

## 2018-10-26 DIAGNOSIS — Z9221 Personal history of antineoplastic chemotherapy: Secondary | ICD-10-CM | POA: Diagnosis not present

## 2018-10-26 DIAGNOSIS — Z79899 Other long term (current) drug therapy: Secondary | ICD-10-CM | POA: Diagnosis not present

## 2018-10-26 DIAGNOSIS — Z923 Personal history of irradiation: Secondary | ICD-10-CM | POA: Insufficient documentation

## 2018-10-26 DIAGNOSIS — E039 Hypothyroidism, unspecified: Secondary | ICD-10-CM

## 2018-10-26 LAB — CMP (CANCER CENTER ONLY)
ALT: 11 U/L (ref 0–44)
AST: 16 U/L (ref 15–41)
Albumin: 4 g/dL (ref 3.5–5.0)
Alkaline Phosphatase: 51 U/L (ref 38–126)
Anion gap: 7 (ref 5–15)
BUN: 18 mg/dL (ref 8–23)
CO2: 29 mmol/L (ref 22–32)
Calcium: 9.2 mg/dL (ref 8.9–10.3)
Chloride: 104 mmol/L (ref 98–111)
Creatinine: 1.1 mg/dL (ref 0.61–1.24)
GFR, Est AFR Am: >60 mL/min (ref >60)
GFR, Estimated: >60 mL/min (ref >60)
Glucose, Bld: 107 mg/dL — ABNORMAL HIGH (ref 70–99)
Potassium: 3.7 mmol/L (ref 3.5–5.1)
Sodium: 140 mmol/L (ref 135–145)
Total Bilirubin: 0.6 mg/dL (ref 0.3–1.2)
Total Protein: 6 g/dL — ABNORMAL LOW (ref 6.5–8.1)

## 2018-10-26 LAB — CBC WITH DIFFERENTIAL (CANCER CENTER ONLY)
Abs Immature Granulocytes: 0.03 10*3/uL (ref 0.00–0.07)
Basophils Absolute: 0 10*3/uL (ref 0.0–0.1)
Basophils Relative: 1 %
Eosinophils Absolute: 0.1 10*3/uL (ref 0.0–0.5)
Eosinophils Relative: 1 %
HCT: 46.3 % (ref 39.0–52.0)
Hemoglobin: 15.4 g/dL (ref 13.0–17.0)
Immature Granulocytes: 0 %
Lymphocytes Relative: 11 %
Lymphs Abs: 0.8 10*3/uL (ref 0.7–4.0)
MCH: 31.6 pg (ref 26.0–34.0)
MCHC: 33.3 g/dL (ref 30.0–36.0)
MCV: 95.1 fL (ref 80.0–100.0)
Monocytes Absolute: 0.8 10*3/uL (ref 0.1–1.0)
Monocytes Relative: 10 %
Neutro Abs: 5.8 10*3/uL (ref 1.7–7.7)
Neutrophils Relative %: 77 %
Platelet Count: 191 10*3/uL (ref 150–400)
RBC: 4.87 MIL/uL (ref 4.22–5.81)
RDW: 13 % (ref 11.5–15.5)
WBC Count: 7.6 10*3/uL (ref 4.0–10.5)
nRBC: 0 % (ref 0.0–0.2)

## 2018-10-26 LAB — TSH: TSH: 1.716 u[IU]/mL (ref 0.320–4.118)

## 2018-10-26 NOTE — Progress Notes (Signed)
Hematology and Oncology Follow Up Visit  Randy Hanson [id number] 1940/04/29 78 y.o. 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  He looks fantastic.  We last saw him 6 months ago.  He is eating quite well.  He is on low-dose prednisone for his lichen planus.  This is being treated at [facility].  He does have some easy bruising.  I told him that this was from the prednisone.  We could try to have his prednisone dose decreased but there is probably would cause a flareup of the lichen planus.  He has had no problems with bowels or bladder.  He has had no cough.  He is swallowing okay.  He has had no bleeding.  So far, there is been no issues with his thyroid.   Overall, his performance status is ECOG 1.      Medications:  Allergies as of 10/26/2018      Reactions   Lisinopril Cough   Acyclovir And Related Other (See Comments)   Fever, chills   Famciclovir Other (See Comments)   Fever, chills      Medication List       Accurate as of October 26, 2018 10:20 AM. If you have any questions, ask your nurse or doctor.        chlorhexidine 0.12 % solution Commonly known as: PERIDEX Use as directed 15 mLs in the mouth or throat 2 (two) times daily. Mouth rinse   dexamethasone 0.5 MG/5ML solution Commonly known as: DECADRON Take by mouth daily. Mouth rinse   lidocaine 2 % solution Commonly known as: XYLOCAINE   predniSONE 10 MG tablet Commonly known as: DELTASONE Take 10 mg by mouth daily with breakfast.   terazosin 2 MG capsule Commonly known as: HYTRIN Take 2 mg by mouth at bedtime.       Allergies:  Allergies  Allergen Reactions  . Lisinopril Cough  . Acyclovir And Related Other (See Comments)    Fever, chills   . Famciclovir Other (See Comments)     Fever, chills    Past Medical History, Surgical history, Social history, and Family History were reviewed and updated.  Review of Systems: Review of Systems  Constitutional: Negative.   HENT: Negative.   Eyes: Negative.   Respiratory: Negative.   Cardiovascular: Negative.   Gastrointestinal: Negative.   Genitourinary: Negative.   Musculoskeletal: Negative.   Skin: Negative.   Neurological: Negative.   Endo/Heme/Allergies: Negative.   Psychiatric/Behavioral: Negative.      Physical Exam:  weight is 207 lb (93.9 kg). His temporal temperature is 97.8 F (36.6 C). His blood pressure is 117/64 and his pulse is 77. His respiration is 18 and oxygen saturation is 95%.   Wt Readings from Last 3 Encounters:  10/26/18 207 lb (93.9 kg)  07/31/18 207 lb (93.9 kg)  04/27/18 203 lb (92.1 kg)    Physical Exam Vitals signs reviewed.  HENT:     Head: Normocephalic and atraumatic.  Eyes:     Pupils: Pupils are equal, round, and reactive to light.  Neck:     Musculoskeletal: Normal range of motion.  Cardiovascular:     Rate and Rhythm: Normal rate and regular rhythm.     Heart sounds: Normal heart sounds.  Pulmonary:     Effort: Pulmonary effort is normal.     Breath sounds: Normal breath sounds.  Abdominal:     General: Bowel sounds are normal.     Palpations: Abdomen is soft.  Musculoskeletal: Normal range of motion.        General: No tenderness or deformity.  Lymphadenopathy:     Cervical: No cervical adenopathy.  Skin:    General: Skin is warm and dry.     Findings: No erythema or rash.  Neurological:     Mental Status: He is alert and oriented to person, place, and time.  Psychiatric:        Behavior: Behavior normal.        Thought Content: Thought content normal.        Judgment: Judgment normal.      Lab Results  Component Value Date   WBC 7.6 10/26/2018   HGB 15.4 10/26/2018   HCT 46.3 10/26/2018   MCV 95.1 10/26/2018   PLT 191 10/26/2018   No  results found for: FERRITIN, IRON, TIBC, UIBC, IRONPCTSAT Lab Results  Component Value Date   RBC 4.87 10/26/2018   No results found for: KPAFRELGTCHN, LAMBDASER, KAPLAMBRATIO No results found for: IGGSERUM, IGA, IGMSERUM No results found for: Marda Stalker, SPEI   Chemistry      Component Value Date/Time   NA 140 10/26/2018 0920   NA 146 (H) 02/20/2017 1007   NA 140 07/04/2016 1014   K 3.7 10/26/2018 0920   K 4.7 02/20/2017 1007   K 4.7 07/04/2016 1014   CL 104 10/26/2018 0920   CL 104 02/20/2017 1007   CO2 29 10/26/2018 0920   CO2 31 02/20/2017 1007   CO2 27 07/04/2016 1014   BUN 18 10/26/2018 0920   BUN 14 02/20/2017 1007   BUN 22.1 07/04/2016 1014   CREATININE 1.10 10/26/2018 0920   CREATININE 1.1 02/20/2017 1007   CREATININE 1.0 07/04/2016 1014      Component Value Date/Time   CALCIUM 9.2 10/26/2018 0920   CALCIUM 9.8 02/20/2017 1007   CALCIUM 10.6 (H) 07/04/2016 1014   ALKPHOS 51 10/26/2018 0920   ALKPHOS 72 02/20/2017 1007   ALKPHOS 255 (H) 07/04/2016 1014   AST 16 10/26/2018 0920   AST 28 07/04/2016 1014   ALT 11 10/26/2018 0920   ALT 17 02/20/2017 1007   ALT 55 07/04/2016 1014   BILITOT 0.6 10/26/2018 0920   BILITOT 0.80 07/04/2016 1014     Impression and Plan: Randy Hanson is a very pleasant 55- yo white male with stage I  HPV+ - squamous cell carcinoma the right tonsil. He completed radiation and chemotherapy in early August, 2017.   I see no evidence of recurrence of his malignancy.  I am grateful that his quality of life is doing quite well right now.  I will plan to see him back in another 6 months.  We do have to watch with his thyroid.  Back in May, his TSH was 1.5.  [name], MD 8/17/202010:20 AM

## 2018-10-26 NOTE — Telephone Encounter (Signed)
lmom to inform patient of Feb 2021 appts per 8/17 los

## 2018-12-22 DIAGNOSIS — R42 Dizziness and giddiness: Secondary | ICD-10-CM | POA: Diagnosis not present

## 2018-12-22 DIAGNOSIS — Z23 Encounter for immunization: Secondary | ICD-10-CM | POA: Diagnosis not present

## 2018-12-22 DIAGNOSIS — Z125 Encounter for screening for malignant neoplasm of prostate: Secondary | ICD-10-CM | POA: Diagnosis not present

## 2018-12-22 DIAGNOSIS — G4489 Other headache syndrome: Secondary | ICD-10-CM | POA: Diagnosis not present

## 2018-12-22 DIAGNOSIS — R351 Nocturia: Secondary | ICD-10-CM | POA: Diagnosis not present

## 2018-12-22 DIAGNOSIS — C099 Malignant neoplasm of tonsil, unspecified: Secondary | ICD-10-CM | POA: Diagnosis not present

## 2018-12-22 DIAGNOSIS — E559 Vitamin D deficiency, unspecified: Secondary | ICD-10-CM | POA: Diagnosis not present

## 2018-12-22 DIAGNOSIS — I10 Essential (primary) hypertension: Secondary | ICD-10-CM | POA: Diagnosis not present

## 2018-12-22 DIAGNOSIS — Z1389 Encounter for screening for other disorder: Secondary | ICD-10-CM | POA: Diagnosis not present

## 2018-12-22 DIAGNOSIS — Z0001 Encounter for general adult medical examination with abnormal findings: Secondary | ICD-10-CM | POA: Diagnosis not present

## 2018-12-22 DIAGNOSIS — N401 Enlarged prostate with lower urinary tract symptoms: Secondary | ICD-10-CM | POA: Diagnosis not present

## 2019-01-26 DIAGNOSIS — Z720 Tobacco use: Secondary | ICD-10-CM | POA: Diagnosis not present

## 2019-01-26 DIAGNOSIS — C099 Malignant neoplasm of tonsil, unspecified: Secondary | ICD-10-CM | POA: Diagnosis not present

## 2019-03-22 ENCOUNTER — Observation Stay (HOSPITAL_BASED_OUTPATIENT_CLINIC_OR_DEPARTMENT_OTHER): Payer: Medicare Other

## 2019-03-22 ENCOUNTER — Emergency Department (HOSPITAL_COMMUNITY): Payer: Medicare Other

## 2019-03-22 ENCOUNTER — Encounter (HOSPITAL_COMMUNITY): Payer: Self-pay

## 2019-03-22 ENCOUNTER — Inpatient Hospital Stay (HOSPITAL_COMMUNITY)
Admission: EM | Admit: 2019-03-22 | Discharge: 2019-03-25 | DRG: 419 | Disposition: A | Discharge status: Home / Self Care | Payer: Medicare Other | Attending: Surgery | Admitting: Surgery

## 2019-03-22 ENCOUNTER — Other Ambulatory Visit: Payer: Self-pay

## 2019-03-22 ENCOUNTER — Observation Stay (HOSPITAL_COMMUNITY): Payer: Medicare Other

## 2019-03-22 DIAGNOSIS — I4891 Unspecified atrial fibrillation: Secondary | ICD-10-CM | POA: Diagnosis present

## 2019-03-22 DIAGNOSIS — L439 Lichen planus, unspecified: Secondary | ICD-10-CM | POA: Diagnosis present

## 2019-03-22 DIAGNOSIS — Z20822 Contact with and (suspected) exposure to covid-19: Secondary | ICD-10-CM | POA: Diagnosis present

## 2019-03-22 DIAGNOSIS — I10 Essential (primary) hypertension: Secondary | ICD-10-CM | POA: Diagnosis not present

## 2019-03-22 DIAGNOSIS — N401 Enlarged prostate with lower urinary tract symptoms: Secondary | ICD-10-CM | POA: Diagnosis not present

## 2019-03-22 DIAGNOSIS — Z01810 Encounter for preprocedural cardiovascular examination: Secondary | ICD-10-CM

## 2019-03-22 DIAGNOSIS — K8020 Calculus of gallbladder without cholecystitis without obstruction: Secondary | ICD-10-CM | POA: Diagnosis not present

## 2019-03-22 DIAGNOSIS — K8070 Calculus of gallbladder and bile duct without cholecystitis without obstruction: Secondary | ICD-10-CM | POA: Diagnosis not present

## 2019-03-22 DIAGNOSIS — Z79899 Other long term (current) drug therapy: Secondary | ICD-10-CM

## 2019-03-22 DIAGNOSIS — I4819 Other persistent atrial fibrillation: Secondary | ICD-10-CM

## 2019-03-22 DIAGNOSIS — Z7952 Long term (current) use of systemic steroids: Secondary | ICD-10-CM

## 2019-03-22 DIAGNOSIS — K8000 Calculus of gallbladder with acute cholecystitis without obstruction: Principal | ICD-10-CM | POA: Diagnosis present

## 2019-03-22 DIAGNOSIS — I959 Hypotension, unspecified: Secondary | ICD-10-CM | POA: Diagnosis not present

## 2019-03-22 DIAGNOSIS — K82A1 Gangrene of gallbladder in cholecystitis: Secondary | ICD-10-CM | POA: Diagnosis present

## 2019-03-22 DIAGNOSIS — Z87891 Personal history of nicotine dependence: Secondary | ICD-10-CM

## 2019-03-22 DIAGNOSIS — R319 Hematuria, unspecified: Secondary | ICD-10-CM

## 2019-03-22 DIAGNOSIS — K810 Acute cholecystitis: Secondary | ICD-10-CM | POA: Diagnosis not present

## 2019-03-22 DIAGNOSIS — Z8249 Family history of ischemic heart disease and other diseases of the circulatory system: Secondary | ICD-10-CM

## 2019-03-22 DIAGNOSIS — R079 Chest pain, unspecified: Secondary | ICD-10-CM | POA: Diagnosis not present

## 2019-03-22 DIAGNOSIS — Z9221 Personal history of antineoplastic chemotherapy: Secondary | ICD-10-CM

## 2019-03-22 DIAGNOSIS — E785 Hyperlipidemia, unspecified: Secondary | ICD-10-CM | POA: Diagnosis present

## 2019-03-22 DIAGNOSIS — I4430 Unspecified atrioventricular block: Secondary | ICD-10-CM | POA: Diagnosis not present

## 2019-03-22 DIAGNOSIS — Z923 Personal history of irradiation: Secondary | ICD-10-CM

## 2019-03-22 DIAGNOSIS — R351 Nocturia: Secondary | ICD-10-CM | POA: Diagnosis present

## 2019-03-22 DIAGNOSIS — Z85818 Personal history of malignant neoplasm of other sites of lip, oral cavity, and pharynx: Secondary | ICD-10-CM

## 2019-03-22 DIAGNOSIS — R0789 Other chest pain: Secondary | ICD-10-CM | POA: Diagnosis not present

## 2019-03-22 DIAGNOSIS — R310 Gross hematuria: Secondary | ICD-10-CM | POA: Diagnosis not present

## 2019-03-22 DIAGNOSIS — K8050 Calculus of bile duct without cholangitis or cholecystitis without obstruction: Secondary | ICD-10-CM

## 2019-03-22 LAB — CBC WITH DIFFERENTIAL/PLATELET
Abs Immature Granulocytes: 0.06 10*3/uL (ref 0.00–0.07)
Basophils Absolute: 0 10*3/uL (ref 0.0–0.1)
Basophils Relative: 0 %
Eosinophils Absolute: 0 10*3/uL (ref 0.0–0.5)
Eosinophils Relative: 0 %
HCT: 42.1 % (ref 39.0–52.0)
Hemoglobin: 14.7 g/dL (ref 13.0–17.0)
Immature Granulocytes: 1 %
Lymphocytes Relative: 5 %
Lymphs Abs: 0.5 10*3/uL — ABNORMAL LOW (ref 0.7–4.0)
MCH: 32.5 pg (ref 26.0–34.0)
MCHC: 34.9 g/dL (ref 30.0–36.0)
MCV: 92.9 fL (ref 80.0–100.0)
Monocytes Absolute: 0.9 10*3/uL (ref 0.1–1.0)
Monocytes Relative: 8 %
Neutro Abs: 9.6 10*3/uL — ABNORMAL HIGH (ref 1.7–7.7)
Neutrophils Relative %: 86 %
Platelets: 184 10*3/uL (ref 150–400)
RBC: 4.53 MIL/uL (ref 4.22–5.81)
RDW: 12.4 % (ref 11.5–15.5)
WBC: 11.2 10*3/uL — ABNORMAL HIGH (ref 4.0–10.5)
nRBC: 0 % (ref 0.0–0.2)

## 2019-03-22 LAB — LIPASE, BLOOD: Lipase: 37 U/L (ref 11–51)

## 2019-03-22 LAB — BASIC METABOLIC PANEL
Anion gap: 12 (ref 5–15)
BUN: 12 mg/dL (ref 8–23)
CO2: 25 mmol/L (ref 22–32)
Calcium: 9.4 mg/dL (ref 8.9–10.3)
Chloride: 101 mmol/L (ref 98–111)
Creatinine, Ser: 1.12 mg/dL (ref 0.61–1.24)
GFR calc Af Amer: >60 mL/min (ref >60)
GFR calc non Af Amer: >60 mL/min (ref >60)
Glucose, Bld: 152 mg/dL — ABNORMAL HIGH (ref 70–99)
Potassium: 3.3 mmol/L — ABNORMAL LOW (ref 3.5–5.1)
Sodium: 138 mmol/L (ref 135–145)

## 2019-03-22 LAB — RESPIRATORY PANEL BY RT PCR (FLU A&B, COVID)
Influenza A by PCR: NEGATIVE
Influenza B by PCR: NEGATIVE
SARS Coronavirus 2 by RT PCR: NEGATIVE

## 2019-03-22 LAB — TROPONIN I (HIGH SENSITIVITY)
Troponin I (High Sensitivity): 5 ng/L (ref <18)
Troponin I (High Sensitivity): 6 ng/L (ref <18)

## 2019-03-22 LAB — HEPATIC FUNCTION PANEL
ALT: 16 U/L (ref 0–44)
AST: 19 U/L (ref 15–41)
Albumin: 3.6 g/dL (ref 3.5–5.0)
Alkaline Phosphatase: 43 U/L (ref 38–126)
Bilirubin, Direct: 0.1 mg/dL (ref 0.0–0.2)
Indirect Bilirubin: 0.7 mg/dL (ref 0.3–0.9)
Total Bilirubin: 0.8 mg/dL (ref 0.3–1.2)
Total Protein: 5.8 g/dL — ABNORMAL LOW (ref 6.5–8.1)

## 2019-03-22 LAB — HEMOGLOBIN A1C
Hgb A1c MFr Bld: 5.8 % — ABNORMAL HIGH (ref 4.8–5.6)
Mean Plasma Glucose: 119.76 mg/dL

## 2019-03-22 LAB — PROTIME-INR
INR: 1 (ref 0.8–1.2)
Prothrombin Time: 12.8 seconds (ref 11.4–15.2)

## 2019-03-22 LAB — ECHOCARDIOGRAM COMPLETE

## 2019-03-22 MED ORDER — DIPHENHYDRAMINE HCL 12.5 MG/5ML PO ELIX: 12.5000 mg | ORAL_SOLUTION | Freq: Four times a day (QID) | ORAL | Status: DC | PRN

## 2019-03-22 MED ORDER — ONDANSETRON HCL 4 MG/2ML IJ SOLN
4.0000 mg | Freq: Once | INTRAMUSCULAR | Status: AC
  Administered 2019-03-22: 4 mg via INTRAVENOUS
  Filled 2019-03-22: qty 2

## 2019-03-22 MED ORDER — ACETAMINOPHEN 650 MG RE SUPP: 650.0000 mg | Freq: Four times a day (QID) | RECTAL | Status: DC | PRN

## 2019-03-22 MED ORDER — ENOXAPARIN SODIUM 40 MG/0.4ML ~~LOC~~ SOLN
40.0000 mg | SUBCUTANEOUS | Status: DC
  Administered 2019-03-22 – 2019-03-24 (×3): 40 mg via SUBCUTANEOUS
  Filled 2019-03-22 (×3): qty 0.4

## 2019-03-22 MED ORDER — DIPHENHYDRAMINE HCL 50 MG/ML IJ SOLN: 12.5000 mg | Freq: Four times a day (QID) | INTRAMUSCULAR | Status: DC | PRN

## 2019-03-22 MED ORDER — MORPHINE SULFATE (PF) 4 MG/ML IV SOLN
4.0000 mg | Freq: Once | INTRAVENOUS | Status: AC
  Administered 2019-03-22: 4 mg via INTRAVENOUS
  Filled 2019-03-22: qty 1

## 2019-03-22 MED ORDER — TERAZOSIN HCL 1 MG PO CAPS
2.0000 mg | ORAL_CAPSULE | Freq: Every day | ORAL | Status: DC
  Administered 2019-03-22 – 2019-03-24 (×3): 2 mg via ORAL
  Filled 2019-03-22 (×2): qty 2
  Filled 2019-03-22: qty 1
  Filled 2019-03-22: qty 2

## 2019-03-22 MED ORDER — METOPROLOL TARTRATE 25 MG PO TABS
25.0000 mg | ORAL_TABLET | Freq: Three times a day (TID) | ORAL | Status: DC
  Administered 2019-03-22 – 2019-03-25 (×8): 25 mg via ORAL
  Filled 2019-03-22 (×9): qty 1

## 2019-03-22 MED ORDER — SIMETHICONE 80 MG PO CHEW: 40.0000 mg | CHEWABLE_TABLET | Freq: Four times a day (QID) | ORAL | Status: DC | PRN

## 2019-03-22 MED ORDER — ONDANSETRON HCL 4 MG/2ML IJ SOLN: 4.0000 mg | Freq: Four times a day (QID) | INTRAMUSCULAR | Status: DC | PRN

## 2019-03-22 MED ORDER — PREDNISONE 10 MG PO TABS
10.0000 mg | ORAL_TABLET | Freq: Every day | ORAL | Status: DC
  Administered 2019-03-23 – 2019-03-25 (×3): 10 mg via ORAL
  Filled 2019-03-22 (×3): qty 1

## 2019-03-22 MED ORDER — SODIUM CHLORIDE 0.9% FLUSH: 3.0000 mL | Freq: Once | INTRAVENOUS | Status: DC

## 2019-03-22 MED ORDER — SODIUM CHLORIDE 0.9 % IV SOLN
2.0000 g | INTRAVENOUS | Status: DC
  Administered 2019-03-22 – 2019-03-24 (×3): 2 g via INTRAVENOUS
  Filled 2019-03-22 (×3): qty 20

## 2019-03-22 MED ORDER — DOCUSATE SODIUM 100 MG PO CAPS
100.0000 mg | ORAL_CAPSULE | Freq: Two times a day (BID) | ORAL | Status: DC
  Administered 2019-03-22 – 2019-03-25 (×6): 100 mg via ORAL
  Filled 2019-03-22 (×6): qty 1

## 2019-03-22 MED ORDER — HYDROMORPHONE HCL 1 MG/ML IJ SOLN
0.5000 mg | INTRAMUSCULAR | Status: DC | PRN
  Administered 2019-03-23 – 2019-03-24 (×5): 1 mg via INTRAVENOUS
  Filled 2019-03-22 (×5): qty 1

## 2019-03-22 MED ORDER — METHOCARBAMOL 500 MG PO TABS
500.0000 mg | ORAL_TABLET | Freq: Four times a day (QID) | ORAL | Status: DC | PRN
  Administered 2019-03-22 – 2019-03-25 (×2): 500 mg via ORAL
  Filled 2019-03-22 (×2): qty 1

## 2019-03-22 MED ORDER — METOPROLOL TARTRATE 5 MG/5ML IV SOLN: 5.0000 mg | Freq: Four times a day (QID) | INTRAVENOUS | Status: DC | PRN

## 2019-03-22 MED ORDER — POTASSIUM CHLORIDE IN NACL 20-0.9 MEQ/L-% IV SOLN
INTRAVENOUS | Status: DC
  Filled 2019-03-22 (×6): qty 1000

## 2019-03-22 MED ORDER — OXYCODONE HCL 5 MG PO TABS
5.0000 mg | ORAL_TABLET | ORAL | Status: DC | PRN
  Administered 2019-03-22 – 2019-03-25 (×2): 10 mg via ORAL
  Filled 2019-03-22 (×2): qty 2

## 2019-03-22 MED ORDER — ACETAMINOPHEN 325 MG PO TABS: 650.0000 mg | ORAL_TABLET | Freq: Four times a day (QID) | ORAL | Status: DC | PRN

## 2019-03-22 MED ORDER — CHLORHEXIDINE GLUCONATE 0.12 % MT SOLN
15.0000 mL | Freq: Two times a day (BID) | OROMUCOSAL | Status: DC
  Administered 2019-03-22 – 2019-03-25 (×6): 15 mL via OROMUCOSAL
  Filled 2019-03-22 (×6): qty 15

## 2019-03-22 MED ORDER — POLYETHYLENE GLYCOL 3350 17 G PO PACK: 17.0000 g | PACK | Freq: Every day | ORAL | Status: DC | PRN

## 2019-03-22 MED ORDER — ONDANSETRON 4 MG PO TBDP: 4.0000 mg | ORAL_TABLET | Freq: Four times a day (QID) | ORAL | Status: DC | PRN

## 2019-03-22 MED ORDER — PANTOPRAZOLE SODIUM 40 MG IV SOLR
40.0000 mg | Freq: Every day | INTRAVENOUS | Status: DC
  Administered 2019-03-22 – 2019-03-23 (×2): 40 mg via INTRAVENOUS
  Filled 2019-03-22 (×2): qty 40

## 2019-03-22 NOTE — Consult Note (Signed)
CARDIOLOGY CONSULT NOTE       Patient ID: Randy Hanson MRN: [id number] DOB/AGE: 06/14/1940 79 y.o.  Admit date: 03/22/2019 Referring Physician: Mattie Marlin PA Primary Physician: [name], MD Primary Cardiologist: Verdis Prime  Reason for Consultation: Preoperative Clearance   Active Problems:   Acute cholecystitis   HPI:  79 y.o. history of throat cancer in remission 3 years, distant smoking. Previous palpitations and chest pain with multiple normal myovue studies most recently with Dr Katrinka Blazing in 2016. Has had epigastric and back pain for 4-6 weeks. Seen by primary at [facility] Dr Boone Master. ? Abnormal ECG. He has a history of false positive ECG on stress tests in past. He indicates that she ordered a stress test 3 weeks ago When he showed up it was never done due to abnormality on ECG. I suspect is was for afib. No documented CAD. He uses a "CardioGlide" twice per day with no issues. No dyspnea. He does not note palpitations. COVID test pending In ER appears to be in afib at rate of 110-122 bpm He is unaware of diagnosis . No contraindications to anticoagulation. He had previous gastrostomy tube during radiation Rx of his tonsillar cancer RUQ [place] suggests acute cholecystitis with stones and distended GB Currently denies chest pain nausea , vomiting or diarrhea   This patients CHA2DS2-VASc Score and unadjusted Ischemic Stroke Rate (% per year) is equal to 4.8 % stroke rate/year from a score of 4  Above score calculated as 1 point each if present [CHF, HTN, DM, Vascular=MI/PAD/Aortic Plaque, Age if 65-74, or Male] Above score calculated as 2 points each if present [Age > 75, or Stroke/TIA/TE]   ROS All other systems reviewed and negative except as noted above  Past Medical History:  Diagnosis Date  . Back pain   . BPH (benign prostatic hyperplasia)   . 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 DJD (degenerative joint disease)   . Esophageal reflux   . FHx: colonic  polyps   . Hemorrhoids   . History of radiation therapy 08/28/15-- 10/16/15   Right Tonsil and bilateral neck  . Hypercholesteremia   . Hyperlipemia   . Hypertension   . Kidney cysts    [facility] [place] OF KIDNEY STABLE-2012  . Posterior vitreous detachment, left eye    DR. KATHERINE HECKER  AUGUST 2011  . Radiation 08/28/15- 10/16/15   Right Tonsil and Bilateral Neck    Family History  Problem Relation Age of Onset  . Hypertension Mother     Social History   Socioeconomic History  . Marital status: Married    Spouse name: Not on file  . Number of children: Not on file  . Years of education: Not on file  . Highest education level: Not on file  Occupational History  . Not on file  Tobacco Use  . Smoking status: Former Games developer  . Smokeless tobacco: Never Used  . Tobacco comment: quit 35 years ago  Substance and Sexual Activity  . Alcohol use: No    Alcohol/week: 0.0 standard drinks  . Drug use: No  . Sexual activity: Not on file  Other Topics Concern  . Not on file  Social History Narrative   TOBACCO USE CIGARETTES: NEVER SMOKED.NO SMOKING.NO ALCOHOL .CAFFEINE YES:NO  RECREATIONAL DRUGS. OCCUPATION :RETIRED   MARTIAL STATUS : MARRIED    Social Determinants of Health   Financial Resource Strain:   . Difficulty of Paying Living Expenses: Not on file  Food Insecurity:   .  Worried About Programme researcher, broadcasting/film/video in the Last Year: Not on file  . Ran Out of Food in the Last Year: Not on file  Transportation Needs: No Transportation Needs  . Lack of Transportation (Medical): No  . Lack of Transportation (Non-Medical): No  Physical Activity:   . Days of Exercise per Week: Not on file  . Minutes of Exercise per Session: Not on file  Stress:   . Feeling of Stress : Not on file  Social Connections:   . Frequency of Communication with Friends and Family: Not on file  . Frequency of Social Gatherings with Friends and Family: Not on file  . Attends Religious Services: Not on file    . Active Member of Clubs or Organizations: Not on file  . Attends Banker Meetings: Not on file  . Marital Status: Not on file  Intimate Partner Violence:   . Fear of Current or Ex-Partner: Not on file  . Emotionally Abused: Not on file  . Physically Abused: Not on file  . Sexually Abused: Not on file    Past Surgical History:  Procedure Laterality Date  . CARDIAC CATHETERIZATION     remote >15 years ago  . IR GASTROSTOMY TUBE REMOVAL  02/24/2017  . IR GENERIC HISTORICAL  10/11/2015   IR PATIENT EVAL TECH 0-60 MINS 10/11/2015 Darrell K Allred, PA-C [facility]-INTERV RAD  . IR PATIENT EVAL TECH 0-60 MINS  07/05/2016  . IR PATIENT EVAL TECH 0-60 MINS  08/14/2016  . IR PATIENT EVAL TECH 0-60 MINS  12/09/2016  . IR REMOVAL TUN ACCESS W/ PORT W/O FL MOD SED  07/22/2016  . VASECTOMY  1975      Current Facility-Administered Medications:  .  0.9 % NaCl with KCl 20 mEq/ L  infusion, , Intravenous, Continuous, Meuth, Brooke A, PA-C .  acetaminophen (TYLENOL) tablet 650 mg, 650 mg, Oral, Q6H PRN **OR** acetaminophen (TYLENOL) suppository 650 mg, 650 mg, Rectal, Q6H PRN, Meuth, Brooke A, PA-C .  cefTRIAXone (ROCEPHIN) 2 g in sodium chloride 0.9 % 100 mL IVPB, 2 g, Intravenous, Q24H, Meuth, Brooke A, PA-C .  chlorhexidine (PERIDEX) 0.12 % solution 15 mL, 15 mL, Mouth/Throat, BID, Meuth, Brooke A, PA-C .  diphenhydrAMINE (BENADRYL) 12.5 MG/5ML elixir 12.5 mg, 12.5 mg, Oral, Q6H PRN **OR** diphenhydrAMINE (BENADRYL) injection 12.5 mg, 12.5 mg, Intravenous, Q6H PRN, Meuth, Brooke A, PA-C .  docusate sodium (COLACE) capsule 100 mg, 100 mg, Oral, BID, Meuth, Brooke A, PA-C .  enoxaparin (LOVENOX) injection 40 mg, 40 mg, Subcutaneous, Q24H, Meuth, Brooke A, PA-C .  HYDROmorphone (DILAUDID) injection 0.5-1 mg, 0.5-1 mg, Intravenous, Q3H PRN, Meuth, Brooke A, PA-C .  methocarbamol (ROBAXIN) tablet 500 mg, 500 mg, Oral, Q6H PRN, Meuth, Brooke A, PA-C .  metoprolol tartrate (LOPRESSOR) injection 5 mg, 5 mg,  Intravenous, Q6H PRN, Meuth, Brooke A, PA-C .  ondansetron (ZOFRAN-ODT) disintegrating tablet 4 mg, 4 mg, Oral, Q6H PRN **OR** ondansetron (ZOFRAN) injection 4 mg, 4 mg, Intravenous, Q6H PRN, Meuth, Brooke A, PA-C .  oxyCODONE (Oxy IR/ROXICODONE) immediate release tablet 5-10 mg, 5-10 mg, Oral, Q4H PRN, Meuth, Brooke A, PA-C .  pantoprazole (PROTONIX) injection 40 mg, 40 mg, Intravenous, QHS, Meuth, Brooke A, PA-C .  polyethylene glycol (MIRALAX / GLYCOLAX) packet 17 g, 17 g, Oral, Daily PRN, Meuth, Brooke A, PA-C .  [START ON 03/23/2019] predniSONE (DELTASONE) tablet 10 mg, 10 mg, Oral, Q breakfast, Meuth, Brooke A, PA-C .  simethicone (MYLICON) chewable tablet 40 mg, 40 mg,  Oral, Q6H PRN, Meuth, Brooke A, PA-C .  sodium chloride flush (NS) 0.9 % injection 3 mL, 3 mL, Intravenous, Once, Dione Booze, MD .  terazosin (HYTRIN) capsule 2 mg, 2 mg, Oral, QHS, Meuth, Brooke A, PA-C  Current Outpatient Medications:  .  chlorhexidine (PERIDEX) 0.12 % solution, Use as directed 15 mLs in the mouth or throat 2 (two) times daily. Mouth rinse, Disp: , Rfl:  .  predniSONE (DELTASONE) 10 MG tablet, Take 10 mg by mouth daily with breakfast., Disp: , Rfl:  .  terazosin (HYTRIN) 2 MG capsule, Take 2 mg by mouth at bedtime., Disp: , Rfl:  . chlorhexidine  15 mL Mouth/Throat BID  . docusate sodium  100 mg Oral BID  . enoxaparin (LOVENOX) injection  40 mg Subcutaneous Q24H  . pantoprazole (PROTONIX) IV  40 mg Intravenous QHS  . [START ON 03/23/2019] predniSONE  10 mg Oral Q breakfast  . sodium chloride flush  3 mL Intravenous Once  . terazosin  2 mg Oral QHS   . 0.9 % NaCl with KCl 20 mEq / L    . cefTRIAXone (ROCEPHIN)  IV      Physical Exam: Blood pressure 104/90, pulse (!) 121, temperature (!) 97.3 F (36.3 C), temperature source Axillary, resp. rate 20, SpO2 97 %.    Affect appropriate Elderly white male  HEENT: horse voice  Neck supple with no adenopathy port a cath left chest  JVP normal no bruits  no thyromegaly Lungs clear with no wheezing and good diaphragmatic motion Heart:  S1/S2 no murmur, no rub, gallop or click PMI normal Abdomen: no rebound epigastric and RUQ pain  Distal pulses intact with no bruits No edema Neuro non-focal Skin warm and dry No muscular weakness   Labs:   Lab Results  Component Value Date   WBC 11.2 (H) 03/22/2019   HGB 14.7 03/22/2019   HCT 42.1 03/22/2019   MCV 92.9 03/22/2019   PLT 184 03/22/2019    Recent Labs  Lab 03/22/19 0428 03/22/19 0707  NA 138  --   K 3.3*  --   CL 101  --   CO2 25  --   BUN 12  --   CREATININE 1.12  --   CALCIUM 9.4  --   PROT  --  5.8*  BILITOT  --  0.8  ALKPHOS  --  43  ALT  --  16  AST  --  19  GLUCOSE 152*  --       Radiology: DG Chest 2 View  Result Date: 03/22/2019 CLINICAL DATA:  Chest pain EXAM: CHEST - 2 VIEW COMPARISON:  None. FINDINGS: The heart size and mediastinal contours is borderline enlarged. Both lungs are clear. The visualized skeletal structures are unremarkable. IMPRESSION: No active cardiopulmonary disease. Electronically Signed   By: Jonna Clark M.D.   On: 03/22/2019 05:37   US Abdomen Limited  Result Date: 03/22/2019 CLINICAL DATA:  Abdominal pain EXAM: ULTRASOUND ABDOMEN LIMITED RIGHT UPPER QUADRANT COMPARISON:  None. FINDINGS: Gallbladder: Full gallbladder with dependent calculi. Negative sonographic Murphy sign per report. Trace pericholecystic fluid. Common bile duct: Diameter: 4 mm.  Where visualized, no filling defect. Liver: No focal lesion identified. Within normal limits in parenchymal echogenicity. Portal vein is patent on color Doppler imaging with normal direction of blood flow towards the liver. IMPRESSION: Cholelithiasis within the distended gallbladder. There is some minimal pericholecystic fluid but no generalized wall thickening or sonographic Murphy sign typical of acute cholecystitis. Electronically Signed  By: Marnee Spring M.D.   On: 03/22/2019 06:20     EKG: afib rate 116 chronic ST/T wave changes    ASSESSMENT AND PLAN:   1. Preoperative:  I suspect back and epigastric pain from GB disease He can use his "CardioGlide" 30 minutes a day without issue No acue ECG changes other than rhythm Troponin negative. Ok to proceed with surgery in am if TTE shows normal EF have ordered stat in ER today   2. Afib:  Likely recent onset. Add lopressor 25 mg tid for rate control Echo as above Will need anticoagulation post op Will need telemetry bed to follow   3. GB:  Distended by [place] on Rocephin WBC 11.2 mildly elevated LFTls and lipase normal per surgery   Will try to get primary notes from [place]   Signed: Charlton Haws 03/22/2019, 12:37 PM

## 2019-03-22 NOTE — ED Notes (Signed)
Noted afib with RVR, EKG captured and CCS paged. Rate between 115 and 145bpm.

## 2019-03-22 NOTE — ED Provider Notes (Signed)
7:20 AM Care assumed from Dr. Roxanne Mins.  At time of transfer of care, patient is awaiting results of hepatic function panel, lipase, delta troponin.  Suspect symptomatic cholelithiasis based on his ultrasound results showing distended gallbladder with stones but no acute cholecystitis.  After work-up completed, anticipate possible surgical consultation if he is still having severe pain.  9:37 AM Patient's labs returned showing an elevated lipase and AST ALT and alk phos are also normal.  Bilirubin not elevated.  Patient still having some pain in his upper abdomen and reports it was extremely severe at 10 out of 10 earlier requiring IV pain medicine.  Clinically I am concerned about symptomatic cholelithiasis causing the pericholecystic fluid and swollen gallbladder despite not having acute cholecystitis at this time.  We will consult surgery for their input as to patient will be a candidate for further management at this time or if he is able to tolerate p.o. and have improvement in pain enough to be discharged and outpatient follow-up.  Surgery came to see the patient and will admit for further management.  Suspect they want to taken to the operating room.  Patient admitted to general surgery service for further management.  Clinical Impression: 1. Biliary colic   2. Cholelithiasis without cholecystitis     Disposition: Admit  This note was prepared with assistance of Dragon voice recognition software. Occasional wrong-word or sound-a-like substitutions may have occurred due to the inherent limitations of voice recognition software.      Tanise Russman, Gwenyth Allegra, MD 03/22/19 [phone]

## 2019-03-22 NOTE — ED Notes (Signed)
Patient transported to X-ray 

## 2019-03-22 NOTE — ED Notes (Addendum)
Encouraged pt to use room phone to call family to give updates, declines to call family at this time. RN called spouse with update per spouse request.

## 2019-03-22 NOTE — Procedures (Signed)
Cannot do echo at this time since patient is not in room.

## 2019-03-22 NOTE — Progress Notes (Signed)
  Echocardiogram 2D Echocardiogram has been performed.  Randy Hanson 03/22/2019, 2:40 PM

## 2019-03-22 NOTE — H&P (Signed)
[facility] Admission Note  [name] [facility] 21-Feb-1941  [id number].    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 Pain is epigastric and radiates into his back, severe, nothing made it worse, morphine helped. This started around 2300 last night. Associated with nausea and emesis, No fever, or chills, no urinary or bowel issues, no blood in stools, no diarrhea, and no melena. He took an aspirin 325mg  but this did not help. He thought he was having a heart attack. He had pain between his shoulder blades 2 days ago while raking which resolved with rest. ED workup included u/s which shows cholelithiasis within a distended gallbladder and minimal pericholecystic fluid but no generalized wall thickening or sonographic Murphy sign. WBC 11.2. Lipase and LFTs WNL. Cardiac workup negative with normal troponins and no acute EKG changes. He was given morphine and ondansetron with some temporary relief. Due to persistent pain general surgery asked to see.  Of note: patient states he had a nuclear medicine study of his heart at the [place] a few weeks back and he was told he had changes on his EKG that showed an arrhthymia,per patient. Patient was unsure of the name. No follow up yet since the nuc med study.  Abdominal surgical history: G tube Anticoagulants: none Former smoker   Review of Systems  Constitutional: Negative for chills, diaphoresis and fever.  HENT: Negative for sore throat.   Respiratory: Negative for cough and shortness of breath.   Cardiovascular: Negative for chest pain.  Gastrointestinal: Positive for abdominal pain, nausea and vomiting. Negative for blood in stool, constipation and diarrhea.   Genitourinary: Negative for dysuria.  Skin: Negative for rash.  Neurological: Negative for dizziness and loss of consciousness.  All other systems reviewed and are negative.  All systems reviewed and otherwise negative except for as above  Family History  Problem Relation Age of Onset  . Hypertension Mother     Past Medical History:  Diagnosis Date  . Back pain   . BPH (benign prostatic hyperplasia)   . 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 DJD (degenerative joint disease)   . Esophageal reflux   . FHx: colonic polyps   . Hemorrhoids   . History of radiation therapy 08/28/15-- 10/16/15   Right Tonsil and bilateral neck  . Hypercholesteremia   . Hyperlipemia   . Hypertension   . Kidney cysts    [facility] [place] [facility]-2012  . Posterior vitreous detachment, left eye    DR. KATHERINE HECKER  AUGUST 2011  . Radiation 08/28/15- 10/16/15   Right Tonsil and Bilateral Neck    Past Surgical History:  Procedure Laterality Date  . CARDIAC CATHETERIZATION     remote >15 years ago  . IR GASTROSTOMY TUBE REMOVAL  02/24/2017  . IR GENERIC HISTORICAL  10/11/2015   IR PATIENT EVAL TECH 0-60 MINS 10/11/2015 Darrell K Allred, PA-C [facility]-INTERV RAD  . IR PATIENT EVAL TECH 0-60 MINS  07/05/2016  . IR PATIENT EVAL TECH 0-60 MINS  08/14/2016  . IR PATIENT EVAL TECH 0-60 MINS  12/09/2016  . IR REMOVAL TUN ACCESS W/ PORT W/O FL MOD SED  07/22/2016  . VASECTOMY  1975    Social History:  reports that he  has quit smoking. He has never used smokeless tobacco. He reports that he does not drink alcohol or use drugs.  Allergies:  Allergies  Allergen Reactions  . Lisinopril Cough  . Acyclovir And Related Other (See Comments)    Fever, chills   . Famciclovir Other (See Comments)    Fever, chills    (Not in a hospital admission)   Prior to Admission medications   Medication Sig Start Date End Date Taking? Authorizing Provider  chlorhexidine (PERIDEX) 0.12 % solution Use as  directed 15 mLs in the mouth or throat 2 (two) times daily. Mouth rinse   Yes [provider]  predniSONE (DELTASONE) 10 MG tablet Take 10 mg by mouth daily with breakfast.   Yes [provider]  terazosin (HYTRIN) 2 MG capsule Take 2 mg by mouth at bedtime.   Yes [provider]    Blood pressure 104/76, pulse 92, temperature (!) 97.3 F (36.3 C), temperature source Axillary, resp. rate 19, SpO2 97 %. Physical Exam: Physical Exam  Constitutional: He is oriented to person, place, and time and well-developed, well-nourished, and in no distress. No distress.  HENT:  Head: Normocephalic and atraumatic.  Pt wearing mask  Eyes: Pupils are equal, round, and reactive to light. Conjunctivae are normal. Right eye exhibits no discharge. Left eye exhibits no discharge. No scleral icterus.  Cardiovascular: Normal heart sounds and intact distal pulses. An irregular rhythm present. Tachycardia present.  No murmur heard. Pulses:      Radial pulses are 2+ on the right side and 2+ on the left side.  Pt had an irregular heart rate with tachycardia around 110 then within a few minutes his rate was around 80 and he had a normal rhythm.  Pulmonary/Chest: Effort normal and breath sounds normal. No respiratory distress. He has no decreased breath sounds. He has no wheezes. He has no rhonchi. He has no rales.  Abdominal: Soft. Bowel sounds are normal. He exhibits no distension. There is no hepatosplenomegaly, splenomegaly or hepatomegaly. There is abdominal tenderness in the right upper quadrant. There is no rigidity and no guarding.    Previous G tube site with a well healed scar  Musculoskeletal:        General: No tenderness, deformity or edema. Normal range of motion.     Cervical back: Normal range of motion and neck supple.  Lymphadenopathy:    He has no cervical adenopathy.  Neurological: He is alert and oriented to person, place, and time.  Skin: Skin is warm and dry. No rash  noted. He is not diaphoretic.  Psychiatric: Mood and affect normal.  Nursing note and vitals reviewed.    Results for orders placed or performed during the hospital encounter of 03/22/19 (from the past 48 hour(s))  Basic metabolic panel     Status: Abnormal   Collection Time: 03/22/19  4:28 AM  Result Value Ref Range   Sodium 138 135 - 145 mmol/L   Potassium 3.3 (L) 3.5 - 5.1 mmol/L   Chloride 101 98 - 111 mmol/L   CO2 25 22 - 32 mmol/L   Glucose, Bld 152 (H) 70 - 99 mg/dL   BUN 12 8 - 23 mg/dL   Creatinine, Ser 3.24 0.61 - 1.24 mg/dL   Calcium 9.4 8.9 - 40.1 mg/dL   GFR calc non Af Amer >60 >60 mL/min   GFR calc Af Amer >60 >60 mL/min   Anion gap 12 5 - 15    Comment: Performed at [facility]  Hospital Lab, [address], [place], [place] [address]  Troponin I (High Sensitivity)     Status: None   Collection Time: 03/22/19  4:28 AM  Result Value Ref Range   Troponin I (High Sensitivity) 5 <18 ng/L    Comment: (NOTE) Elevated high sensitivity troponin I (hsTnI) values and significant  changes across serial measurements may suggest ACS but many other  chronic and acute conditions are known to elevate hsTnI results.  Refer to the "Links" section for chest pain algorithms and additional  guidance. Performed at [facility] Lab, [address] [place], [place] [address]   Protime-INR (order if Patient is taking Coumadin / Warfarin)     Status: None   Collection Time: 03/22/19  4:28 AM  Result Value Ref Range   Prothrombin Time 12.8 11.4 - 15.2 seconds   INR 1.0 0.8 - 1.2    Comment: (NOTE) INR goal varies based on device and disease states. Performed at [facility], [address], [place], [place] [address]   CBC with Differential     Status: Abnormal   Collection Time: 03/22/19  4:34 AM  Result Value Ref Range   WBC 11.2 (H) 4.0 - 10.5 K/uL   RBC 4.53 4.22 - 5.81 MIL/uL   Hemoglobin 14.7 13.0 - 17.0 g/dL   HCT 21.3 08.6 - 57.8 %   MCV 92.9 80.0 - 100.0 fL   MCH  32.5 26.0 - 34.0 pg   MCHC 34.9 30.0 - 36.0 g/dL   RDW 46.9 62.9 - 52.8 %   Platelets 184 150 - 400 K/uL   nRBC 0.0 0.0 - 0.2 %   Neutrophils Relative % 86 %   Neutro Abs 9.6 (H) 1.7 - 7.7 K/uL   Lymphocytes Relative 5 %   Lymphs Abs 0.5 (L) 0.7 - 4.0 K/uL   Monocytes Relative 8 %   Monocytes Absolute 0.9 0.1 - 1.0 K/uL   Eosinophils Relative 0 %   Eosinophils Absolute 0.0 0.0 - 0.5 K/uL   Basophils Relative 0 %   Basophils Absolute 0.0 0.0 - 0.1 K/uL   Immature Granulocytes 1 %   Abs Immature Granulocytes 0.06 0.00 - 0.07 K/uL    Comment: Performed at [facility], [address], [place], [place] [address]  Lipase, blood     Status: None   Collection Time: 03/22/19  7:07 AM  Result Value Ref Range   Lipase 37 11 - 51 U/L    Comment: Performed at [facility] Lab, [address], [place], [place] [address]  Hepatic function panel     Status: Abnormal   Collection Time: 03/22/19  7:07 AM  Result Value Ref Range   Total Protein 5.8 (L) 6.5 - 8.1 g/dL   Albumin 3.6 3.5 - 5.0 g/dL   AST 19 15 - 41 U/L   ALT 16 0 - 44 U/L   Alkaline Phosphatase 43 38 - 126 U/L   Total Bilirubin 0.8 0.3 - 1.2 mg/dL   Bilirubin, Direct 0.1 0.0 - 0.2 mg/dL   Indirect Bilirubin 0.7 0.3 - 0.9 mg/dL    Comment: Performed at [facility] Lab, [address] [place], [place] [address]  Troponin I (High Sensitivity)     Status: None   Collection Time: 03/22/19  7:07 AM  Result Value Ref Range   Troponin I (High Sensitivity) 6 <18 ng/L    Comment: (NOTE) Elevated high sensitivity troponin I (hsTnI) values and significant  changes across serial measurements may suggest ACS but many other  chronic and acute conditions are known to elevate hsTnI results.  Refer to the "Links" section for chest pain algorithms and additional  guidance. Performed at [facility] Lab, [address] [place], [place] [address]    DG Chest 2 View  Result Date: 03/22/2019 CLINICAL DATA:  Chest pain EXAM:  CHEST - 2 VIEW COMPARISON:  None. FINDINGS: The heart size and mediastinal contours is borderline enlarged. Both lungs are clear. The visualized skeletal structures are unremarkable. IMPRESSION: No active cardiopulmonary disease. Electronically Signed   By: Jonna Clark M.D.   On: 03/22/2019 05:37   US Abdomen Limited  Result Date: 03/22/2019 CLINICAL DATA:  Abdominal pain EXAM: ULTRASOUND ABDOMEN LIMITED RIGHT UPPER QUADRANT COMPARISON:  None. FINDINGS: Gallbladder: Full gallbladder with dependent calculi. Negative sonographic Murphy sign per report. Trace pericholecystic fluid. Common bile duct: Diameter: 4 mm.  Where visualized, no filling defect. Liver: No focal lesion identified. Within normal limits in parenchymal echogenicity. Portal vein is patent on color Doppler imaging with normal direction of blood flow towards the liver. IMPRESSION: Cholelithiasis within the distended gallbladder. There is some minimal pericholecystic fluid but no generalized wall thickening or sonographic Murphy sign typical of acute cholecystitis. Electronically Signed   By: Marnee Spring M.D.   On: 03/22/2019 06:20      Assessment/Plan HTN Stage I HPV+ squamous cell carcinoma of the right tonsil s/p radiation and chemotherapy completed 10/2015 Lichen planus - on prednisone since 01/2019  Biliary colic ?Early acute cholecystitis - Will admit patient to the hospital for observation. Start IV rocephin. Ok for clear liquids today, NPO after midnight. Plan for laparoscopic cholecystectomy tomorrow. Covid test is pending. - will ask cardiology to see for clearance 2/2 irregular rhythm on exam and recent nuc med study  ID - rocephin VTE - SCDs, lovenox FEN - IVF, CLD, NPO after MN Foley - none Follow up - TBD   Mattie Marlin, [facility] Pager on [employer]

## 2019-03-22 NOTE — Progress Notes (Signed)
Reviewed patients echo EF vigorous 65-70% no valve disease Ok to proceed with GB surgery in am  Jenkins Rouge

## 2019-03-22 NOTE — ED Notes (Signed)
CCS returned page, aware of afib, has consulted cardiology

## 2019-03-22 NOTE — ED Triage Notes (Signed)
Patient reports central chest pain radiating to mid back with mild SOB , no emesis or diaphoresis , he took ASA 325 mg prior to arrival , his cardiologist is Dr. Daneen Schick. Marland Kitchen

## 2019-03-22 NOTE — ED Provider Notes (Signed)
[place] EMERGENCY DEPARTMENT Provider Note   CSN: [id number] Arrival date & time: 03/22/19  0416   History Chief Complaint  Patient presents with  . Chest Pain    Randy Hanson is a 79 y.o. male.  The history is provided by the patient.  Chest Pain He has history of hypertension, hyperlipidemia and comes in because of epigastric pain radiating to the back.  Pain started about 11 PM while he was watching television.  Pain is severe and he rates it at 10/10.  There is associated nausea and vomiting but no dyspnea, nausea, diaphoresis.  There is no other radiation of pain.  Nothing makes it better, nothing makes it worse.  He did have sausage for dinner.  He denies tobacco use and denies history of diabetes.    Past Medical History:  Diagnosis Date  . Back pain   . BPH (benign prostatic hyperplasia)   . 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 DJD (degenerative joint disease)   . Esophageal reflux   . FHx: colonic polyps   . Hemorrhoids   . History of radiation therapy 08/28/15-- 10/16/15   Right Tonsil and bilateral neck  . Hypercholesteremia   . Hyperlipemia   . Hypertension   . Kidney cysts    [facility] [place] [facility]  . Posterior vitreous detachment, left eye    DR. KATHERINE HECKER  AUGUST 2011  . Radiation 08/28/15- 10/16/15   Right Tonsil and Bilateral Neck    Patient Active Problem List   Diagnosis Date Noted  . Carcinoma of tonsillar fossa ([place]) 07/19/2015  . 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 Abnormal EKG 04/22/2014  . Essential hypertension 04/22/2014  . Hyperlipidemia 04/22/2014    Past Surgical History:  Procedure Laterality Date  . CARDIAC CATHETERIZATION     remote >15 years ago  . IR GASTROSTOMY TUBE REMOVAL  02/24/2017  . IR GENERIC HISTORICAL  10/11/2015   IR PATIENT EVAL TECH 0-60 MINS 10/11/2015 Darrell K Allred, PA-C [facility]INTERV RAD  . IR PATIENT EVAL TECH 0-60 MINS  07/05/2016  . IR PATIENT EVAL  TECH 0-60 MINS  08/14/2016  . IR PATIENT EVAL TECH 0-60 MINS  12/09/2016  . IR REMOVAL TUN ACCESS W/ PORT W/O FL MOD SED  07/22/2016  . VASECTOMY  1975       Family History  Problem Relation Age of Onset  . Hypertension Mother     Social History   Tobacco Use  . Smoking status: Former Research scientist (life sciences)  . Smokeless tobacco: Never Used  . Tobacco comment: quit 35 years ago  Substance Use Topics  . Alcohol use: No    Alcohol/week: 0.0 standard drinks  . Drug use: No    Home Medications Prior to Admission medications   Medication Sig Start Date End Date Taking? Authorizing Provider  chlorhexidine (PERIDEX) 0.12 % solution Use as directed 15 mLs in the mouth or throat 2 (two) times daily. Mouth rinse    [provider]  dexamethasone (DECADRON) 0.5 MG/5ML solution Take by mouth daily. Mouth rinse    [provider]  lidocaine (XYLOCAINE) 2 % solution  04/17/17   [provider]  predniSONE (DELTASONE) 10 MG tablet Take 10 mg by mouth daily with breakfast.    [provider]  terazosin (HYTRIN) 2 MG capsule Take 2 mg by mouth at bedtime.    [provider]    Allergies    Lisinopril, Acyclovir and related,  and Famciclovir  Review of Systems   Review of Systems  Cardiovascular: Positive for chest pain.  All other systems reviewed and are negative.   Physical Exam Updated Vital Signs BP 103/65 (BP Location: Right Arm)   Pulse 66   Temp (!) 97.3 F (36.3 C) (Axillary)   Resp 14   SpO2 95%   Physical Exam Vitals and nursing note reviewed.   79 year old male, resting comfortably and in no acute distress. Vital signs are normal. Oxygen saturation is 95%, which is normal. Head is normocephalic and atraumatic. PERRLA, EOMI. Oropharynx is clear. Neck is nontender and supple without adenopathy or JVD. Back is nontender and there is no CVA tenderness. Lungs are clear without rales, wheezes, or rhonchi. Chest is nontender. Heart has regular  rate and rhythm without murmur. Abdomen is soft, flat, with moderate epigastric and right upper quadrant pain with plus/minus Murphy sign.  There are no masses or hepatosplenomegaly and peristalsis is hypoactive. Extremities have no cyanosis or edema, full range of motion is present. Skin is warm and dry without rash. Neurologic: Mental status is normal, cranial nerves are intact, there are no motor or sensory deficits.  ED Results / Procedures / Treatments   Labs (all labs ordered are listed, but only abnormal results are displayed) Labs Reviewed  BASIC METABOLIC PANEL - Abnormal; Notable for the following components:      Result Value   Potassium 3.3 (*)    Glucose, Bld 152 (*)    All other components within normal limits  CBC WITH DIFFERENTIAL/PLATELET - Abnormal; Notable for the following components:   WBC 11.2 (*)    Neutro Abs 9.6 (*)    Lymphs Abs 0.5 (*)    All other components within normal limits  PROTIME-INR  LIPASE, BLOOD  HEPATIC FUNCTION PANEL  TROPONIN I (HIGH SENSITIVITY)  TROPONIN I (HIGH SENSITIVITY)    EKG EKG Interpretation  Date/Time:  Monday March 22 2019 04:20:48 EST Ventricular Rate:  68 PR Interval:    QRS Duration: 93 QT Interval:  424 QTC Calculation: 451 R Axis:   66 Text Interpretation: Sinus rhythm Sinus pause Prolonged PR interval No old tracing to compare Confirmed by Delora Fuel ([id number]) on 03/22/2019 4:32:50 AM   Radiology DG Chest 2 View  Result Date: 03/22/2019 CLINICAL DATA:  Chest pain EXAM: CHEST - 2 VIEW COMPARISON:  None. FINDINGS: The heart size and mediastinal contours is borderline enlarged. Both lungs are clear. The visualized skeletal structures are unremarkable. IMPRESSION: No active cardiopulmonary disease. Electronically Signed   By: Prudencio Pair M.D.   On: 03/22/2019 05:37   US Abdomen Limited  Result Date: 03/22/2019 CLINICAL DATA:  Abdominal pain EXAM: ULTRASOUND ABDOMEN LIMITED RIGHT UPPER QUADRANT COMPARISON:  None.  FINDINGS: Gallbladder: Full gallbladder with dependent calculi. Negative sonographic Murphy sign per report. Trace pericholecystic fluid. Common bile duct: Diameter: 4 mm.  Where visualized, no filling defect. Liver: No focal lesion identified. Within normal limits in parenchymal echogenicity. Portal vein is patent on color Doppler imaging with normal direction of blood flow towards the liver. IMPRESSION: Cholelithiasis within the distended gallbladder. There is some minimal pericholecystic fluid but no generalized wall thickening or sonographic Murphy sign typical of acute cholecystitis. Electronically Signed   By: Monte Fantasia M.D.   On: 03/22/2019 06:20    Procedures Procedures   Medications Ordered in ED Medications  sodium chloride flush (NS) 0.9 % injection 3 mL (3 mLs Intravenous Not Given 03/22/19 0435)  ondansetron (ZOFRAN) injection  4 mg (has no administration in time range)  morphine 4 MG/ML injection 4 mg (has no administration in time range)    ED Course  I have reviewed the triage vital signs and the nursing notes.  Pertinent labs & imaging results that were available during my care of the patient were reviewed by me and considered in my medical decision making (see chart for details).  MDM Rules/Calculators/A&P Epigastric pain suspicious for cholelithiasis and biliary colic.  Consider pancreatitis, peptic ulcer disease, angina equivalent.  ECG shows no acute changes.  Old records reviewed, and he has no relevant past visits.  He will be given morphine and ondansetron and will send for right upper quadrant ultrasound.  Chest x-ray is unremarkable.  Initial troponin is normal.  Hepatic functions and lipase are pending.  Ultrasound shows cholelithiasis and a slightly distended gallbladder but no evidence of acute cholecystitis.  He had moderate relief of pain with above-noted treatment and is given additional morphine.  Delta troponin is pending.  Case is signed out to Dr.  Sherry Ruffing.  Final Clinical Impression(s) / ED Diagnoses Final diagnoses:  Biliary colic  Cholelithiasis without cholecystitis    Rx / DC Orders ED Discharge Orders    None       Delora Fuel, [place] 123456 [phone]

## 2019-03-23 ENCOUNTER — Encounter (HOSPITAL_COMMUNITY): Payer: Self-pay

## 2019-03-23 ENCOUNTER — Encounter (HOSPITAL_COMMUNITY): Admission: EM | Disposition: A | Discharge status: Home / Self Care | Payer: Self-pay | Source: Home / Self Care

## 2019-03-23 ENCOUNTER — Observation Stay (HOSPITAL_COMMUNITY): Payer: Medicare Other

## 2019-03-23 DIAGNOSIS — L439 Lichen planus, unspecified: Secondary | ICD-10-CM | POA: Diagnosis not present

## 2019-03-23 DIAGNOSIS — Z20822 Contact with and (suspected) exposure to covid-19: Secondary | ICD-10-CM | POA: Diagnosis not present

## 2019-03-23 DIAGNOSIS — R351 Nocturia: Secondary | ICD-10-CM | POA: Diagnosis not present

## 2019-03-23 DIAGNOSIS — I10 Essential (primary) hypertension: Secondary | ICD-10-CM | POA: Diagnosis not present

## 2019-03-23 DIAGNOSIS — Z9221 Personal history of antineoplastic chemotherapy: Secondary | ICD-10-CM | POA: Diagnosis not present

## 2019-03-23 DIAGNOSIS — K8000 Calculus of gallbladder with acute cholecystitis without obstruction: Secondary | ICD-10-CM | POA: Diagnosis not present

## 2019-03-23 DIAGNOSIS — Z87891 Personal history of nicotine dependence: Secondary | ICD-10-CM | POA: Diagnosis not present

## 2019-03-23 DIAGNOSIS — K82A1 Gangrene of gallbladder in cholecystitis: Secondary | ICD-10-CM | POA: Diagnosis not present

## 2019-03-23 DIAGNOSIS — I4819 Other persistent atrial fibrillation: Secondary | ICD-10-CM | POA: Diagnosis not present

## 2019-03-23 DIAGNOSIS — Z923 Personal history of irradiation: Secondary | ICD-10-CM | POA: Diagnosis not present

## 2019-03-23 DIAGNOSIS — N401 Enlarged prostate with lower urinary tract symptoms: Secondary | ICD-10-CM | POA: Diagnosis not present

## 2019-03-23 DIAGNOSIS — E785 Hyperlipidemia, unspecified: Secondary | ICD-10-CM | POA: Diagnosis not present

## 2019-03-23 DIAGNOSIS — Z7952 Long term (current) use of systemic steroids: Secondary | ICD-10-CM | POA: Diagnosis not present

## 2019-03-23 DIAGNOSIS — E7800 Pure hypercholesterolemia, unspecified: Secondary | ICD-10-CM | POA: Diagnosis not present

## 2019-03-23 DIAGNOSIS — N200 Calculus of kidney: Secondary | ICD-10-CM | POA: Diagnosis not present

## 2019-03-23 DIAGNOSIS — Z79899 Other long term (current) drug therapy: Secondary | ICD-10-CM | POA: Diagnosis not present

## 2019-03-23 DIAGNOSIS — K810 Acute cholecystitis: Secondary | ICD-10-CM | POA: Diagnosis present

## 2019-03-23 DIAGNOSIS — N281 Cyst of kidney, acquired: Secondary | ICD-10-CM | POA: Diagnosis not present

## 2019-03-23 DIAGNOSIS — Z8249 Family history of ischemic heart disease and other diseases of the circulatory system: Secondary | ICD-10-CM | POA: Diagnosis not present

## 2019-03-23 DIAGNOSIS — Z85818 Personal history of malignant neoplasm of other sites of lip, oral cavity, and pharynx: Secondary | ICD-10-CM | POA: Diagnosis not present

## 2019-03-23 DIAGNOSIS — I4891 Unspecified atrial fibrillation: Secondary | ICD-10-CM | POA: Diagnosis not present

## 2019-03-23 DIAGNOSIS — R310 Gross hematuria: Secondary | ICD-10-CM | POA: Diagnosis not present

## 2019-03-23 HISTORY — PX: CHOLECYSTECTOMY: SHX55

## 2019-03-23 LAB — COMPREHENSIVE METABOLIC PANEL
ALT: 17 U/L (ref 0–44)
AST: 22 U/L (ref 15–41)
Albumin: 3 g/dL — ABNORMAL LOW (ref 3.5–5.0)
Alkaline Phosphatase: 40 U/L (ref 38–126)
Anion gap: 9 (ref 5–15)
BUN: 15 mg/dL (ref 8–23)
CO2: 26 mmol/L (ref 22–32)
Calcium: 8.5 mg/dL — ABNORMAL LOW (ref 8.9–10.3)
Chloride: 101 mmol/L (ref 98–111)
Creatinine, Ser: 1.3 mg/dL — ABNORMAL HIGH (ref 0.61–1.24)
GFR calc Af Amer: >60 mL/min (ref >60)
GFR calc non Af Amer: 53 mL/min — ABNORMAL LOW (ref >60)
Glucose, Bld: 104 mg/dL — ABNORMAL HIGH (ref 70–99)
Potassium: 3.9 mmol/L (ref 3.5–5.1)
Sodium: 136 mmol/L (ref 135–145)
Total Bilirubin: 1.4 mg/dL — ABNORMAL HIGH (ref 0.3–1.2)
Total Protein: 5.4 g/dL — ABNORMAL LOW (ref 6.5–8.1)

## 2019-03-23 LAB — CBC
HCT: 41.3 % (ref 39.0–52.0)
Hemoglobin: 14.1 g/dL (ref 13.0–17.0)
MCH: 32.3 pg (ref 26.0–34.0)
MCHC: 34.1 g/dL (ref 30.0–36.0)
MCV: 94.5 fL (ref 80.0–100.0)
Platelets: 184 10*3/uL (ref 150–400)
RBC: 4.37 MIL/uL (ref 4.22–5.81)
RDW: 12.8 % (ref 11.5–15.5)
WBC: 21.6 10*3/uL — ABNORMAL HIGH (ref 4.0–10.5)
nRBC: 0 % (ref 0.0–0.2)

## 2019-03-23 LAB — MRSA PCR SCREENING: MRSA by PCR: NEGATIVE

## 2019-03-23 SURGERY — LAPAROSCOPIC CHOLECYSTECTOMY
Anesthesia: General

## 2019-03-23 SURGERY — LAPAROSCOPIC CHOLECYSTECTOMY
Anesthesia: General | Site: Abdomen

## 2019-03-23 MED ORDER — MIDAZOLAM HCL 2 MG/2ML IJ SOLN
INTRAMUSCULAR | Status: AC
  Filled 2019-03-23: qty 2

## 2019-03-23 MED ORDER — ACETAMINOPHEN 10 MG/ML IV SOLN
INTRAVENOUS | Status: AC
  Filled 2019-03-23: qty 100

## 2019-03-23 MED ORDER — EPHEDRINE SULFATE-NACL 50-0.9 MG/10ML-% IV SOSY
PREFILLED_SYRINGE | INTRAVENOUS | Status: DC | PRN
  Administered 2019-03-23: 10 mg via INTRAVENOUS

## 2019-03-23 MED ORDER — ACETAMINOPHEN 10 MG/ML IV SOLN
1000.0000 mg | Freq: Once | INTRAVENOUS | Status: DC | PRN
  Administered 2019-03-23: 1000 mg via INTRAVENOUS

## 2019-03-23 MED ORDER — OXYCODONE HCL 5 MG/5ML PO SOLN: 5.0000 mg | Freq: Once | ORAL | Status: DC | PRN

## 2019-03-23 MED ORDER — SUGAMMADEX SODIUM 200 MG/2ML IV SOLN
INTRAVENOUS | Status: DC | PRN
  Administered 2019-03-23: 200 mg via INTRAVENOUS
  Administered 2019-03-23: 100 mg via INTRAVENOUS

## 2019-03-23 MED ORDER — PHENYLEPHRINE HCL-NACL 10-0.9 MG/250ML-% IV SOLN: INTRAVENOUS | Status: DC | PRN

## 2019-03-23 MED ORDER — PROMETHAZINE HCL 25 MG/ML IJ SOLN: 6.2500 mg | INTRAMUSCULAR | Status: DC | PRN

## 2019-03-23 MED ORDER — HYDROMORPHONE HCL 1 MG/ML IJ SOLN
0.2500 mg | INTRAMUSCULAR | Status: DC | PRN
  Administered 2019-03-23 (×2): 0.5 mg via INTRAVENOUS

## 2019-03-23 MED ORDER — FENTANYL CITRATE (PF) 250 MCG/5ML IJ SOLN
INTRAMUSCULAR | Status: AC
  Filled 2019-03-23: qty 5

## 2019-03-23 MED ORDER — PHENYLEPHRINE 40 MCG/ML (10ML) SYRINGE FOR IV PUSH (FOR BLOOD PRESSURE SUPPORT)
PREFILLED_SYRINGE | INTRAVENOUS | Status: DC | PRN
  Administered 2019-03-23: 40 ug via INTRAVENOUS
  Administered 2019-03-23: 80 ug via INTRAVENOUS

## 2019-03-23 MED ORDER — ROCURONIUM BROMIDE 10 MG/ML (PF) SYRINGE
PREFILLED_SYRINGE | INTRAVENOUS | Status: DC | PRN
  Administered 2019-03-23: 50 mg via INTRAVENOUS

## 2019-03-23 MED ORDER — 0.9 % SODIUM CHLORIDE (POUR BTL) OPTIME
TOPICAL | Status: DC | PRN
  Administered 2019-03-23: 1000 mL

## 2019-03-23 MED ORDER — PROPOFOL 10 MG/ML IV BOLUS
INTRAVENOUS | Status: DC | PRN
  Administered 2019-03-23 (×2): 50 mg via INTRAVENOUS
  Administered 2019-03-23: 100 mg via INTRAVENOUS

## 2019-03-23 MED ORDER — DEXAMETHASONE SODIUM PHOSPHATE 10 MG/ML IJ SOLN
INTRAMUSCULAR | Status: DC | PRN
  Administered 2019-03-23: 10 mg via INTRAVENOUS

## 2019-03-23 MED ORDER — ONDANSETRON HCL 4 MG/2ML IJ SOLN
INTRAMUSCULAR | Status: DC | PRN
  Administered 2019-03-23: 4 mg via INTRAVENOUS

## 2019-03-23 MED ORDER — PROPOFOL 10 MG/ML IV BOLUS
INTRAVENOUS | Status: AC
  Filled 2019-03-23: qty 20

## 2019-03-23 MED ORDER — OXYCODONE HCL 5 MG PO TABS: 5.0000 mg | ORAL_TABLET | Freq: Once | ORAL | Status: DC | PRN

## 2019-03-23 MED ORDER — SODIUM CHLORIDE 0.9 % IR SOLN
Status: DC | PRN
  Administered 2019-03-23: 1000 mL

## 2019-03-23 MED ORDER — LIDOCAINE 2% (20 MG/ML) 5 ML SYRINGE
INTRAMUSCULAR | Status: DC | PRN
  Administered 2019-03-23: 100 mg via INTRAVENOUS

## 2019-03-23 MED ORDER — HYDROMORPHONE HCL 1 MG/ML IJ SOLN
INTRAMUSCULAR | Status: AC
  Filled 2019-03-23: qty 1

## 2019-03-23 MED ORDER — FENTANYL CITRATE (PF) 250 MCG/5ML IJ SOLN
INTRAMUSCULAR | Status: DC | PRN
  Administered 2019-03-23 (×2): 50 ug via INTRAVENOUS

## 2019-03-23 MED ORDER — LACTATED RINGERS IV SOLN: INTRAVENOUS | Status: DC

## 2019-03-23 MED ORDER — BUPIVACAINE HCL (PF) 0.25 % IJ SOLN
INTRAMUSCULAR | Status: DC | PRN
  Administered 2019-03-23: 20 mL

## 2019-03-23 MED ORDER — SUCCINYLCHOLINE CHLORIDE 20 MG/ML IJ SOLN
INTRAMUSCULAR | Status: DC | PRN
  Administered 2019-03-23: 140 mg via INTRAVENOUS

## 2019-03-23 MED ORDER — BUPIVACAINE HCL (PF) 0.25 % IJ SOLN
INTRAMUSCULAR | Status: AC
  Filled 2019-03-23: qty 30

## 2019-03-23 MED ORDER — PHENYLEPHRINE HCL-NACL 10-0.9 MG/250ML-% IV SOLN
INTRAVENOUS | Status: DC | PRN
  Administered 2019-03-23: 40 ug/min via INTRAVENOUS

## 2019-03-23 MED ORDER — CEFAZOLIN SODIUM-DEXTROSE 2-3 GM-%(50ML) IV SOLR
INTRAVENOUS | Status: DC | PRN
  Administered 2019-03-23: 2 g via INTRAVENOUS

## 2019-03-23 SURGICAL SUPPLY — 36 items
ADH SKN CLS APL DERMABOND .7 (GAUZE/BANDAGES/DRESSINGS) ×1
APL PRP STRL LF DISP 70% ISPRP (MISCELLANEOUS) ×1
APPLIER CLIP 5 13 M/L LIGAMAX5 (MISCELLANEOUS) ×3
APR CLP MED LRG 5 ANG JAW (MISCELLANEOUS) ×1
BAG SPEC RTRVL LRG 6X4 10 (ENDOMECHANICALS) ×1
CANISTER SUCT 3000ML PPV (MISCELLANEOUS) ×3 IMPLANT
CHLORAPREP W/TINT 26 (MISCELLANEOUS) ×3 IMPLANT
CLIP APPLIE 5 13 M/L LIGAMAX5 (MISCELLANEOUS) ×1 IMPLANT
COVER SURGICAL LIGHT HANDLE (MISCELLANEOUS) ×3 IMPLANT
COVER WAND RF STERILE (DRAPES) ×1 IMPLANT
DERMABOND ADVANCED (GAUZE/BANDAGES/DRESSINGS) ×2
DERMABOND ADVANCED .7 DNX12 (GAUZE/BANDAGES/DRESSINGS) ×1 IMPLANT
ELECT REM PT RETURN 9FT ADLT (ELECTROSURGICAL) ×3
ELECTRODE REM PT RTRN 9FT ADLT (ELECTROSURGICAL) ×1 IMPLANT
GLOVE SURG SIGNA 7.5 PF LTX (GLOVE) ×3 IMPLANT
GOWN STRL REUS W/ TWL LRG LVL3 (GOWN DISPOSABLE) ×2 IMPLANT
GOWN STRL REUS W/ TWL XL LVL3 (GOWN DISPOSABLE) ×1 IMPLANT
GOWN STRL REUS W/TWL LRG LVL3 (GOWN DISPOSABLE) ×6
GOWN STRL REUS W/TWL XL LVL3 (GOWN DISPOSABLE) ×3
KIT BASIN OR (CUSTOM PROCEDURE TRAY) ×3 IMPLANT
KIT TURNOVER KIT B (KITS) ×3 IMPLANT
NS IRRIG 1000ML POUR BTL (IV SOLUTION) ×3 IMPLANT
PAD ARMBOARD 7.5X6 YLW CONV (MISCELLANEOUS) ×3 IMPLANT
POUCH SPECIMEN RETRIEVAL 10MM (ENDOMECHANICALS) ×3 IMPLANT
SCISSORS LAP 5X35 DISP (ENDOMECHANICALS) ×3 IMPLANT
SET IRRIG TUBING LAPAROSCOPIC (IRRIGATION / IRRIGATOR) ×3 IMPLANT
SET TUBE SMOKE EVAC HIGH FLOW (TUBING) ×3 IMPLANT
SLEEVE ENDOPATH XCEL 5M (ENDOMECHANICALS) ×6 IMPLANT
SPECIMEN JAR SMALL (MISCELLANEOUS) ×3 IMPLANT
SUT MNCRL AB 4-0 PS2 18 (SUTURE) ×3 IMPLANT
TOWEL GREEN STERILE (TOWEL DISPOSABLE) ×3 IMPLANT
TOWEL GREEN STERILE FF (TOWEL DISPOSABLE) ×3 IMPLANT
TRAY LAPAROSCOPIC MC (CUSTOM PROCEDURE TRAY) ×3 IMPLANT
TROCAR XCEL BLUNT TIP 100MML (ENDOMECHANICALS) ×3 IMPLANT
TROCAR XCEL NON-BLD 5MMX100MML (ENDOMECHANICALS) ×3 IMPLANT
WATER STERILE IRR 1000ML POUR (IV SOLUTION) ×3 IMPLANT

## 2019-03-23 NOTE — Anesthesia Preprocedure Evaluation (Signed)
Anesthesia Evaluation  Patient identified by MRN, date of birth, ID band Patient awake    Reviewed: Allergy & Precautions, NPO status , Patient's Chart, lab work & pertinent test results  Airway Mallampati: II  TM Distance: >3 FB Neck ROM: Full   Comment: Radiation 08/28/15- 10/16/15 Right Tonsil and Bilateral Neck  Dental no notable dental hx.    Pulmonary neg pulmonary ROS, former smoker,    Pulmonary exam normal breath sounds clear to auscultation       Cardiovascular hypertension, Normal cardiovascular exam Rhythm:Regular Rate:Normal     Neuro/Psych negative neurological ROS  negative psych ROS   GI/Hepatic negative GI ROS, Neg liver ROS,   Endo/Other  negative endocrine ROS  Renal/GU negative Renal ROS  negative genitourinary   Musculoskeletal negative musculoskeletal ROS (+)   Abdominal   Peds negative pediatric ROS (+)  Hematology negative hematology ROS (+)   Anesthesia Other Findings   Reproductive/Obstetrics negative OB ROS                             Anesthesia Physical Anesthesia Plan  ASA: II  Anesthesia Plan: General   Post-op Pain Management:    Induction: Intravenous  PONV Risk Score and Plan: 2 and Ondansetron, Dexamethasone and Treatment may vary due to age or medical condition  Airway Management Planned: Oral ETT and Video Laryngoscope Planned  Additional Equipment:   Intra-op Plan:   Post-operative Plan: Extubation in OR  Informed Consent: I have reviewed the patients History and Physical, chart, labs and discussed the procedure including the risks, benefits and alternatives for the proposed anesthesia with the patient or authorized representative who has indicated his/her understanding and acceptance.     Dental advisory given  Plan Discussed with: CRNA and Surgeon  Anesthesia Plan Comments:         Anesthesia Quick Evaluation

## 2019-03-23 NOTE — Op Note (Signed)
Laparoscopic Cholecystectomy Procedure Note  Indications: This patient presents with symptomatic gallbladder disease and will undergo laparoscopic cholecystectomy.  Pre-operative Diagnosis: acute cholecystitis with cholelithiasis  Post-operative Diagnosis: Gangrenous cholecystitis with cholelithiasis  Surgeon: Coralie Keens   Assistants: 0  Anesthesia: General endotracheal anesthesia  ASA Class: 3  Procedure Details  The patient was seen again in the Holding Room. The risks, benefits, complications, treatment options, and expected outcomes were discussed with the patient. The possibilities of reaction to medication, pulmonary aspiration, perforation of viscus, bleeding, recurrent infection, finding a normal gallbladder, the need for additional procedures, failure to diagnose a condition, the possible need to convert to an open procedure, and creating a complication requiring transfusion or operation were discussed with the patient. The likelihood of improving the patient's symptoms with return to their baseline status is good.  The patient and/or family concurred with the proposed plan, giving informed consent. The site of surgery properly noted. The patient was taken to Operating Room, identified as Randy Hanson and the procedure verified as Laparoscopic Cholecystectomy with Intraoperative Cholangiogram. A Time Out was held and the above information confirmed.  Prior to the induction of general anesthesia, antibiotic prophylaxis was administered. General endotracheal anesthesia was then administered and tolerated well. After the induction, the abdomen was prepped with Chloraprep and draped in sterile fashion. The patient was positioned in the supine position.  Local anesthetic agent was injected into the skin near the umbilicus and an incision made. We dissected down to the abdominal fascia with blunt dissection.  The fascia was incised vertically and we entered the peritoneal cavity  bluntly.  A pursestring suture of 0-Vicryl was placed around the fascial opening.  The Hasson cannula was inserted and secured with the stay suture.  Pneumoperitoneum was then created with CO2 and tolerated well without any adverse changes in the patient's vital signs. A 23mm port was placed in the subxiphoid position.  Two 5-mm ports were placed in the right upper quadrant. All skin incisions were infiltrated with a local anesthetic agent before making the incision and placing the trocars.   We positioned the patient in reverse Trendelenburg, tilted slightly to the patient's left.  The gallbladder was identified and found to be acutely inflamed with gangrenous changes.  I had to aspirate bile from the gallbladder in order to grasp it. The fundus grasped and retracted cephalad. Adhesions were lysed bluntly and with the electrocautery where indicated, taking care not to injure any adjacent organs or viscus. The infundibulum was grasped and retracted laterally, exposing the peritoneum overlying the triangle of Calot. This was then divided and exposed in a blunt fashion. The cystic duct was clearly identified and bluntly dissected circumferentially. A critical view of the cystic duct and cystic artery was obtained.  The cystic duct was then ligated with clips and divided. The cystic artery was, dissected free, ligated with clips and divided as well.   The gallbladder was dissected from the liver bed in retrograde fashion with the electrocautery. The gallbladder was removed and placed in an Endocatch sac. The liver bed was irrigated and inspected. Hemostasis was achieved with the electrocautery. Copious irrigation was utilized and was repeatedly aspirated until clear.  The gallbladder and Endocatch sac were then removed through the umbilical port site.  The pursestring suture was used to close the umbilical fascia.    We again inspected the right upper quadrant for hemostasis.  Pneumoperitoneum was released as we  removed the trocars.  4-0 Monocryl was used to close the  skin.  Skin glue was then applied. The patient was then extubated and brought to the recovery room in stable condition. Instrument, sponge, and needle counts were correct at closure and at the conclusion of the case.   Findings: Acute gangrenous Cholecystitis with Cholelithiasis  Estimated Blood Loss: Minimal         Drains: 0         Specimens: Gallbladder           Complications: None; patient tolerated the procedure well.         Disposition: PACU - hemodynamically stable.         Condition: stable

## 2019-03-23 NOTE — Progress Notes (Signed)
Subjective:  Denies SSCP, palpitations or Dyspnea For lap choly today with Dr Magnus Ivan   Objective:  Vitals:   03/22/19 1946 03/23/19 0038 03/23/19 0418 03/23/19 0810  BP: 104/62 132/71 136/89 126/63  Pulse: 91 90 90 93  Resp: 19 18 16 18   Temp: 99.6 F (37.6 C) 98.6 F (37 C) 98 F (36.7 C) 98.3 F (36.8 C)  TempSrc: Oral Oral Oral Oral  SpO2: 95% 93% 93% 91%    Intake/Output from previous day:  Intake/Output Summary (Last 24 hours) at 03/23/2019 4098 Last data filed at 03/23/2019 0600 Gross per 24 hour  Intake 1300.94 ml  Output -  Net 1300.94 ml    Physical Exam: Affect appropriate Elderly hoarse male  HEENT: normal Neck supple with no adenopathy Left port a cath IJ /subclavian  Lungs clear with no wheezing and good diaphragmatic motion Heart:  S1/S2 no murmur, no rub, gallop or click PMI normal Abdomen: benighn, BS positve, no tenderness, no AAA no bruit.  No HSM or HJR Distal pulses intact with no bruits No edema Neuro non-focal Skin warm and dry No muscular weakness   Lab Results: Basic Metabolic Panel: Recent Labs    03/22/19 0428 03/23/19 0453  NA 138 136  K 3.3* 3.9  CL 101 101  CO2 25 26  GLUCOSE 152* 104*  BUN 12 15  CREATININE 1.12 1.30*  CALCIUM 9.4 8.5*   Liver Function Tests: Recent Labs    03/22/19 0707 03/23/19 0453  AST 19 22  ALT 16 17  ALKPHOS 43 40  BILITOT 0.8 1.4*  PROT 5.8* 5.4*  ALBUMIN 3.6 3.0*   Recent Labs    03/22/19 0707  LIPASE 37   CBC: Recent Labs    03/22/19 0434 03/23/19 0453  WBC 11.2* 21.6*  NEUTROABS 9.6*  --   HGB 14.7 14.1  HCT 42.1 41.3  MCV 92.9 94.5  PLT 184 184   Cardiac Enzymes: No results for input(s): CKTOTAL, CKMB, CKMBINDEX, TROPONINI in the last 72 hours. BNP: Invalid input(s): POCBNP D-Dimer: No results for input(s): DDIMER in the last 72 hours. Hemoglobin A1C: Recent Labs    03/22/19 0434  HGBA1C 5.8*   Fasting Lipid Panel: No results for input(s): CHOL, HDL,  LDLCALC, TRIG, CHOLHDL, LDLDIRECT in the last 72 hours. Thyroid Function Tests: No results for input(s): TSH, T4TOTAL, T3FREE, THYROIDAB in the last 72 hours.  Invalid input(s): FREET3 Anemia Panel: No results for input(s): VITAMINB12, FOLATE, FERRITIN, TIBC, IRON, RETICCTPCT in the last 72 hours.  Imaging: DG Chest 2 View  Result Date: 03/22/2019 CLINICAL DATA:  Chest pain EXAM: CHEST - 2 VIEW COMPARISON:  None. FINDINGS: The heart size and mediastinal contours is borderline enlarged. Both lungs are clear. The visualized skeletal structures are unremarkable. IMPRESSION: No active cardiopulmonary disease. Electronically Signed   By: Jonna Clark M.D.   On: 03/22/2019 05:37   US Abdomen Limited  Result Date: 03/22/2019 CLINICAL DATA:  Abdominal pain EXAM: ULTRASOUND ABDOMEN LIMITED RIGHT UPPER QUADRANT COMPARISON:  None. FINDINGS: Gallbladder: Full gallbladder with dependent calculi. Negative sonographic Murphy sign per report. Trace pericholecystic fluid. Common bile duct: Diameter: 4 mm.  Where visualized, no filling defect. Liver: No focal lesion identified. Within normal limits in parenchymal echogenicity. Portal vein is patent on color Doppler imaging with normal direction of blood flow towards the liver. IMPRESSION: Cholelithiasis within the distended gallbladder. There is some minimal pericholecystic fluid but no generalized wall thickening or sonographic Murphy sign typical of acute cholecystitis. Electronically  Signed   By: Marnee Spring M.D.   On: 03/22/2019 06:20   ECHOCARDIOGRAM COMPLETE  Result Date: 03/22/2019   ECHOCARDIOGRAM REPORT   Patient Name:   Randy Hanson Date of Exam: 03/22/2019 Medical Rec #:  [id number]     Height:       72.0 in Accession #:    [id number]    Weight:       207.0 lb Date of Birth:  May 04, 1940    BSA:          2.16 m Patient Age:    79 years      BP:           105/67 mmHg Patient Gender: M             HR:           116 bpm. Exam Location:  Inpatient  Procedure: 2D Echo                    STAT ECHO Reported to: Dr. Eden Emms on 03/22/2019 2:29:00 PM. Indications:    atrial fibrillation 427.31  History:        Patient has no prior history of Echocardiogram examinations.  Sonographer:    Delcie Roch Referring Phys: [id number] Shavell Nored C Andersen Mckiver IMPRESSIONS  1. Left ventricular ejection fraction, by visual estimation, is 65 to 70%. The left ventricle has hyperdynamic function. There is no left ventricular hypertrophy.  2. Left ventricular diastolic parameters are consistent with Grade I diastolic dysfunction (impaired relaxation).  3. The left ventricle has no regional wall motion abnormalities.  4. Global right ventricle has normal systolic function.The right ventricular size is normal. No increase in right ventricular wall thickness.  5. Left atrial size was normal.  6. Right atrial size was normal.  7. Mild to moderate mitral annular calcification.  8. The mitral valve is normal in structure. No evidence of mitral valve regurgitation. No evidence of mitral stenosis.  9. The tricuspid valve is normal in structure. 10. The aortic valve is normal in structure. Aortic valve regurgitation is not visualized. Mild aortic valve sclerosis without stenosis. 11. The pulmonic valve was normal in structure. Pulmonic valve regurgitation is trivial. 12. The inferior vena cava is normal in size with greater than 50% respiratory variability, suggesting right atrial pressure of 3 mmHg. FINDINGS  Left Ventricle: Left ventricular ejection fraction, by visual estimation, is 65 to 70%. The left ventricle has hyperdynamic function. The left ventricle has no regional wall motion abnormalities. There is no left ventricular hypertrophy. Left ventricular diastolic parameters are consistent with Grade I diastolic dysfunction (impaired relaxation). Normal left atrial pressure. Right Ventricle: The right ventricular size is normal. No increase in right ventricular wall thickness. Global RV systolic  function is has normal systolic function. Left Atrium: Left atrial size was normal in size. Right Atrium: Right atrial size was normal in size Pericardium: There is no evidence of pericardial effusion. Mitral Valve: The mitral valve is normal in structure. Mild to moderate mitral annular calcification. No evidence of mitral valve regurgitation. No evidence of mitral valve stenosis by observation. Tricuspid Valve: The tricuspid valve is normal in structure. Tricuspid valve regurgitation is not demonstrated. Aortic Valve: The aortic valve is normal in structure. Aortic valve regurgitation is not visualized. Mild aortic valve sclerosis is present, with no evidence of aortic valve stenosis. Pulmonic Valve: The pulmonic valve was normal in structure. Pulmonic valve regurgitation is trivial. Pulmonic regurgitation is trivial. Aorta: The aortic root, ascending aorta  and aortic arch are all structurally normal, with no evidence of dilitation or obstruction. Venous: The inferior vena cava is normal in size with greater than 50% respiratory variability, suggesting right atrial pressure of 3 mmHg. IAS/Shunts: No atrial level shunt detected by color flow Doppler. There is no evidence of a patent foramen ovale. No ventricular septal defect is seen or detected. There is no evidence of an atrial septal defect.  LEFT VENTRICLE PLAX 2D LVIDd:         4.00 cm LVIDs:         3.00 cm LV PW:         1.10 cm LV IVS:        1.10 cm LVOT diam:     2.00 cm LV SV:         35 ml LV SV Index:   15.90 LVOT Area:     3.14 cm  RIGHT VENTRICLE RV S prime:     18.70 cm/s TAPSE (M-mode): 2.0 cm LEFT ATRIUM             Index       RIGHT ATRIUM           Index LA diam:        3.10 cm 1.43 cm/m  RA Area:     17.20 cm LA Vol (A2C):   29.5 ml 13.65 ml/m RA Volume:   46.30 ml  21.42 ml/m LA Vol (A4C):   51.5 ml 23.82 ml/m LA Biplane Vol: 42.7 ml 19.75 ml/m  AORTIC VALVE LVOT Vmax:   121.00 cm/s LVOT Vmean:  79.100 cm/s LVOT VTI:    0.196 m  AORTA  Ao Root diam: 3.70 cm  SHUNTS Systemic VTI:  0.20 m Systemic Diam: 2.00 cm  Donato Schultz MD Electronically signed by Donato Schultz MD Signature Date/Time: 03/22/2019/2:40:59 PM    Final     Cardiac Studies:  ECG: afib nonspecific ST changes chronic    Telemetry:  afib   Echo: EF 65-70% no significant valve disease   Medications:   . chlorhexidine  15 mL Mouth/Throat BID  . docusate sodium  100 mg Oral BID  . enoxaparin (LOVENOX) injection  40 mg Subcutaneous Q24H  . metoprolol tartrate  25 mg Oral TID  . pantoprazole (PROTONIX) IV  40 mg Intravenous QHS  . predniSONE  10 mg Oral Q breakfast  . sodium chloride flush  3 mL Intravenous Once  . terazosin  2 mg Oral QHS     . 0.9 % NaCl with KCl 20 mEq / L 75 mL/hr at 03/22/19 1323  . cefTRIAXone (ROCEPHIN)  IV 2 g (03/22/19 1324)    Assessment/Plan:   1. Afib:  Will need anticoagulation post surgery CHADVASC 3 On oral lopressor for rate control Can change To iv cardizem when NPO after surgery Suspect epigastric / back pain related to GB disease and not CAD Can have f/u at [facility] post d/c Echo with no RWMA and normal EF Continue Rocephin for cholecystitis Surgery with Dr Magnus Ivan today  Charlton Haws 03/23/2019, 8:19 AM

## 2019-03-23 NOTE — Anesthesia Procedure Notes (Signed)
Procedure Name: Intubation Date/Time: 03/23/2019 9:25 AM Performed by: Larene Beach, CRNA Pre-anesthesia Checklist: Patient identified, Emergency Drugs available, Suction available and Patient being monitored Patient Re-evaluated:Patient Re-evaluated prior to induction Oxygen Delivery Method: Circle system utilized Preoxygenation: Pre-oxygenation with 100% oxygen Induction Type: IV induction Ventilation: Mask ventilation without difficulty, Oral airway inserted - appropriate to patient size and Two handed mask ventilation required Laryngoscope Size: Glidescope and 4 Grade View: Grade I Tube type: Oral Tube size: 7.0 mm Number of attempts: 1 Airway Equipment and Method: Oral airway,  Video-laryngoscopy and Rigid stylet Placement Confirmation: ETT inserted through vocal cords under direct vision,  positive ETCO2 and breath sounds checked- equal and bilateral Secured at: 22 cm Tube secured with: Tape Dental Injury: Teeth and Oropharynx as per pre-operative assessment  Difficulty Due To: Difficulty was anticipated Comments: Intubated by Paulina Fusi, SRNA

## 2019-03-23 NOTE — Transfer of Care (Signed)
Immediate Anesthesia Transfer of Care Note  Patient: Randy Hanson  Procedure(s) Performed: LAPAROSCOPIC CHOLECYSTECTOMY (N/A Abdomen)  Patient Location: PACU  Anesthesia Type:General  Level of Consciousness: awake and patient cooperative  Airway & Oxygen Therapy: Patient Spontanous Breathing  Post-op Assessment: Report given to RN and Post -op Vital signs reviewed and stable  Post vital signs: Reviewed and stable  Last Vitals:  Vitals Value Taken Time  BP 129/74 03/23/19 0957  Temp    Pulse 97 03/23/19 1000  Resp 19 03/23/19 1000  SpO2 92 % 03/23/19 1000  Vitals shown include unvalidated device data.  Last Pain:  Vitals:   03/23/19 0835  TempSrc:   PainSc: 2       Patients Stated Pain Goal: 3 ([id number])  Complications: No apparent anesthesia complications

## 2019-03-23 NOTE — Progress Notes (Signed)
Patient ID: Randy Hanson, male   DOB: 1941/02/10, 79 y.o.   MRN: [id number]   Pre Procedure note for inpatients:   Randy Hanson has been scheduled for Procedure(s): LAPAROSCOPIC CHOLECYSTECTOMY (N/A) today. The various methods of treatment have been discussed with the patient. After consideration of the risks, benefits and treatment options the patient has consented to the planned procedure.   The patient has been seen and labs reviewed. There are no changes in the patient's condition to prevent proceeding with the planned procedure today.  Recent labs:  Lab Results  Component Value Date   WBC 21.6 (H) 03/23/2019   HGB 14.1 03/23/2019   HCT 41.3 03/23/2019   PLT 184 03/23/2019   GLUCOSE 104 (H) 03/23/2019   ALT 17 03/23/2019   AST 22 03/23/2019   NA 136 03/23/2019   K 3.9 03/23/2019   CL 101 03/23/2019   CREATININE 1.30 (H) 03/23/2019   BUN 15 03/23/2019   CO2 26 03/23/2019   TSH 1.716 10/26/2018   INR 1.0 03/22/2019   HGBA1C 5.8 (H) 03/22/2019    Coralie Keens, MD 03/23/2019 8:08 AM

## 2019-03-23 NOTE — Anesthesia Postprocedure Evaluation (Signed)
Anesthesia Post Note  Patient: Randy Hanson  Procedure(s) Performed: LAPAROSCOPIC CHOLECYSTECTOMY (N/A Abdomen)     Patient location during evaluation: PACU Anesthesia Type: General Level of consciousness: awake and alert Pain management: pain level controlled Vital Signs Assessment: post-procedure vital signs reviewed and stable Respiratory status: spontaneous breathing, nonlabored ventilation, respiratory function stable and patient connected to nasal cannula oxygen Cardiovascular status: blood pressure returned to baseline and stable Postop Assessment: no apparent nausea or vomiting Anesthetic complications: no    Last Vitals:  Vitals:   03/23/19 1123 03/23/19 1409  BP: (!) 107/56 113/66  Pulse: 83 64  Resp: 18 15  Temp: 36.8 C (!) 36.4 C  SpO2: 94% 96%    Last Pain:  Vitals:   03/23/19 1409  TempSrc: Oral  PainSc:                  Mazey Mantell S

## 2019-03-23 NOTE — Discharge Instructions (Addendum)
[facility]  LAPAROSCOPIC SURGERY: POST OP INSTRUCTIONS Always review your discharge instruction sheet given to you by the facility where your surgery was performed. IF YOU HAVE DISABILITY OR FAMILY LEAVE FORMS, YOU MUST BRING THEM TO THE OFFICE FOR PROCESSING.   DO NOT GIVE THEM TO YOUR DOCTOR.  PAIN CONTROL  1. First take acetaminophen (Tylenol) AND/or ibuprofen (Advil) to control your pain after surgery.  Follow directions on package.  Taking acetaminophen (Tylenol) and/or ibuprofen (Advil) regularly after surgery will help to control your pain and lower the amount of prescription pain medication you may need.  You should not take more than 4,000 mg (4 grams) of acetaminophen (Tylenol) in 24 hours.  You should not take ibuprofen (Advil), aleve, motrin, naprosyn or other NSAIDS if you have a history of stomach ulcers or chronic kidney disease.  2. A prescription for pain medication may be given to you upon discharge.  Take your pain medication as prescribed, if you still have uncontrolled pain after taking acetaminophen (Tylenol) or ibuprofen (Advil). 3. Use ice packs to help control pain. 4. If you need a refill on your pain medication, please contact your pharmacy.  They will contact our office to request authorization. Prescriptions will not be filled after 5pm or on week-ends.  HOME MEDICATIONS 5. Take your usually prescribed medications unless otherwise directed.  DIET 6. You should follow a light diet the first few days after arrival home.  Be sure to include lots of fluids daily. Avoid fatty, fried foods.   CONSTIPATION 7. It is common to experience some constipation after surgery and if you are taking pain medication.  Increasing fluid intake and taking a stool softener (such as Colace) will usually help or prevent this problem from occurring.  A mild laxative (Milk of Magnesia or Miralax) should be taken according to package instructions if there are no bowel  movements after 48 hours.  WOUND/INCISION CARE 8. Most patients will experience some swelling and bruising in the area of the incisions.  Ice packs will help.  Swelling and bruising can take several days to resolve.  9. Unless discharge instructions indicate otherwise, follow guidelines below  a. STERI-STRIPS - you may remove your outer bandages 48 hours after surgery, and you may shower at that time.  You have steri-strips (small skin tapes) in place directly over the incision.  These strips should be left on the skin for 7-10 days.   b. DERMABOND/SKIN GLUE - you may shower in 24 hours.  The glue will flake off over the next 2-3 weeks. 10. Any sutures or staples will be removed at the office during your follow-up visit.  ACTIVITIES 11. You may resume regular (light) daily activities beginning the next day--such as daily self-care, walking, climbing stairs--gradually increasing activities as tolerated.  You may have sexual intercourse when it is comfortable.  Refrain from any heavy lifting or straining until approved by your doctor. a. You may drive when you are no longer taking prescription pain medication, you can comfortably wear a seatbelt, and you can safely maneuver your car and apply brakes.  FOLLOW-UP 12. You should see your doctor in the office for a follow-up appointment approximately 2-3 weeks after your surgery.  You should have been given your post-op/follow-up appointment when your surgery was scheduled.  If you did not receive a post-op/follow-up appointment, make sure that you call for this appointment within a day or two after you arrive home to insure a convenient appointment time.  OTHER INSTRUCTIONS  WHEN TO CALL YOUR DOCTOR: 1. Fever over 101.0 2. Inability to urinate 3. Continued bleeding from incision. 4. Increased pain, redness, or drainage from the incision. 5. Increasing abdominal pain  The clinic staff is available to answer your questions during regular business  hours.  Please don't hesitate to call and ask to speak to one of the nurses for clinical concerns.  If you have a medical emergency, go to the nearest emergency room or call 911.  A surgeon from [facility] is always on call at the hospital. [address], [place], [place], [place]  [id number] ? [address], [place], [place]   [id number] [phone] ? [phone] ? FAX [phone]    Atrial Fibrillation  Atrial fibrillation is a type of irregular or rapid heartbeat (arrhythmia). In atrial fibrillation, the top part of the heart (atria) beats in an irregular pattern. This makes the heart unable to pump blood normally and effectively. The goal of treatment is to prevent blood clots from forming, control your heart rate, or restore your heartbeat to a normal rhythm. If this condition is not treated, it can cause serious problems, such as a weakened heart muscle (cardiomyopathy) or a stroke. What are the causes? This condition is often caused by medical conditions that damage the heart's electrical system. These include:  High blood pressure (hypertension). This is the most common cause.  Certain heart problems or conditions, such as heart failure, coronary artery disease, heart valve problems, or heart surgery.  Diabetes.  Overactive thyroid (hyperthyroidism).  Obesity.  Chronic kidney disease. In some cases, the cause of this condition is not known. What increases the risk? This condition is more likely to develop in:  Older people.  People who smoke.  Athletes who do endurance exercise.  People who have a family history of atrial fibrillation.  Men.  People who use drugs.  People who drink a lot of alcohol.  People who have lung conditions, such as emphysema, pneumonia, or COPD.  People who have obstructive sleep apnea. What are the signs or symptoms? Symptoms of this condition include:  A feeling that your heart is racing or beating  irregularly.  Discomfort or pain in your chest.  Shortness of breath.  Sudden light-headedness or weakness.  Tiring easily during exercise or activity.  Fatigue.  Syncope (fainting).  Sweating. In some cases, there are no symptoms. How is this diagnosed? Your health care provider may detect atrial fibrillation when taking your pulse. If detected, this condition may be diagnosed with:  An electrocardiogram (ECG) to check electrical signals of the heart.  An ambulatory cardiac monitor to record your heart's activity for a few days.  A transthoracic echocardiogram (TTE) to create pictures of your heart.  A transesophageal echocardiogram (TEE) to create even closer pictures of your heart.  A stress test to check your blood supply while you exercise.  Imaging tests, such as a CT scan or chest X-ray.  Blood tests. How is this treated? Treatment depends on underlying conditions and how you feel when you experience atrial fibrillation. This condition may be treated with:  Medicines to prevent blood clots or to treat heart rate or heart rhythm problems.  Electrical cardioversion to reset the heart's rhythm.  A pacemaker to correct abnormal heart rhythm.  Ablation to remove the heart tissue that sends abnormal signals.  Left atrial appendage closure to seal the area where blood clots can form. In some cases, underlying conditions will be treated.  Follow these instructions at home: Medicines  Take over-the counter and prescription medicines only as told by your health care provider.  Do not take any new medicines without talking to your health care provider.  If you are taking blood thinners: ? Talk with your health care provider before you take any medicines that contain aspirin or NSAIDs, such as ibuprofen. These medicines increase your risk for dangerous bleeding. ? Take your medicine exactly as told, at the same time every day. ? Avoid activities that could cause  injury or bruising, and follow instructions about how to prevent falls. ? Wear a medical alert bracelet or carry a card that lists what medicines you take. Lifestyle      Do not use any products that contain nicotine or tobacco, such as cigarettes, e-cigarettes, and chewing tobacco. If you need help quitting, ask your health care provider.  Eat heart-healthy foods. Talk with a dietitian to make an eating plan that is right for you.  Exercise regularly as told by your health care provider.  Do not drink alcohol.  Lose weight if you are overweight.  Do not use drugs, including cannabis. General instructions  If you have obstructive sleep apnea, manage your condition as told by your health care provider.  Do not use diet pills unless your health care provider approves. Diet pills can make heart problems worse.  Keep all follow-up visits as told by your health care provider. This is important. Contact a health care provider if you:  Notice a change in the rate, rhythm, or strength of your heartbeat.  Are taking a blood thinner and you notice more bruising.  Tire more easily when you exercise or do heavy work.  Have a sudden change in weight. Get help right away if you have:   Chest pain, abdominal pain, sweating, or weakness.  Trouble breathing.  Side effects of blood thinners, such as blood in your vomit, stool, or urine, or bleeding that cannot stop.  Any symptoms of a stroke. "BE FAST" is an easy way to remember the main warning signs of a stroke: ? B - Balance. Signs are dizziness, sudden trouble walking, or loss of balance. ? E - Eyes. Signs are trouble seeing or a sudden change in vision. ? F - Face. Signs are sudden weakness or numbness of the face, or the face or eyelid drooping on one side. ? A - Arms. Signs are weakness or numbness in an arm. This happens suddenly and usually on one side of the body. ? S - Speech. Signs are sudden trouble speaking, slurred  speech, or trouble understanding what people say. ? T - Time. Time to call emergency services. Write down what time symptoms started.  Other signs of a stroke, such as: ? A sudden, severe headache with no known cause. ? Nausea or vomiting. ? Seizure. These symptoms may represent a serious problem that is an emergency. Do not wait to see if the symptoms will go away. Get medical help right away. Call your local emergency services (911 in the [place] Do not drive yourself to the hospital. Summary  Atrial fibrillation is a type of irregular or rapid heartbeat (arrhythmia).  Symptoms include a feeling that your heart is beating fast or irregularly.  You may be given medicines to prevent blood clots or to treat heart rate or heart rhythm problems.  Get help right away if you have signs or symptoms of a stroke.  Get help right away if you cannot catch your  breath or have chest pain or pressure. This information is not intended to replace advice given to you by your health care provider. Make sure you discuss any questions you have with your health care provider. Document Revised: 08/19/2018 Document Reviewed: 08/19/2018 Elsevier Patient Education  [place].

## 2019-03-24 ENCOUNTER — Inpatient Hospital Stay (HOSPITAL_COMMUNITY): Payer: Medicare Other

## 2019-03-24 LAB — COMPREHENSIVE METABOLIC PANEL
ALT: 29 U/L (ref 0–44)
AST: 36 U/L (ref 15–41)
Albumin: 2.7 g/dL — ABNORMAL LOW (ref 3.5–5.0)
Alkaline Phosphatase: 48 U/L (ref 38–126)
Anion gap: 8 (ref 5–15)
BUN: 16 mg/dL (ref 8–23)
CO2: 23 mmol/L (ref 22–32)
Calcium: 8.7 mg/dL — ABNORMAL LOW (ref 8.9–10.3)
Chloride: 106 mmol/L (ref 98–111)
Creatinine, Ser: 1.04 mg/dL (ref 0.61–1.24)
GFR calc Af Amer: >60 mL/min (ref >60)
GFR calc non Af Amer: >60 mL/min (ref >60)
Glucose, Bld: 158 mg/dL — ABNORMAL HIGH (ref 70–99)
Potassium: 4.3 mmol/L (ref 3.5–5.1)
Sodium: 137 mmol/L (ref 135–145)
Total Bilirubin: 0.7 mg/dL (ref 0.3–1.2)
Total Protein: 5.3 g/dL — ABNORMAL LOW (ref 6.5–8.1)

## 2019-03-24 LAB — URINALYSIS, ROUTINE W REFLEX MICROSCOPIC
Bacteria, UA: NONE SEEN
Bilirubin Urine: NEGATIVE
Glucose, UA: NEGATIVE mg/dL
Ketones, ur: NEGATIVE mg/dL
Leukocytes,Ua: NEGATIVE
Nitrite: NEGATIVE
Protein, ur: NEGATIVE mg/dL
Specific Gravity, Urine: 1.008 (ref 1.005–1.030)
pH: 6 (ref 5.0–8.0)

## 2019-03-24 LAB — CBC
HCT: 39.8 % (ref 39.0–52.0)
Hemoglobin: 13.3 g/dL (ref 13.0–17.0)
MCH: 32 pg (ref 26.0–34.0)
MCHC: 33.4 g/dL (ref 30.0–36.0)
MCV: 95.9 fL (ref 80.0–100.0)
Platelets: 165 10*3/uL (ref 150–400)
RBC: 4.15 MIL/uL — ABNORMAL LOW (ref 4.22–5.81)
RDW: 12.9 % (ref 11.5–15.5)
WBC: 21.8 10*3/uL — ABNORMAL HIGH (ref 4.0–10.5)
nRBC: 0 % (ref 0.0–0.2)

## 2019-03-24 LAB — SURGICAL PATHOLOGY

## 2019-03-24 MED ORDER — PANTOPRAZOLE SODIUM 40 MG PO TBEC
40.0000 mg | DELAYED_RELEASE_TABLET | Freq: Every day | ORAL | Status: DC
  Administered 2019-03-24: 40 mg via ORAL
  Filled 2019-03-24: qty 1

## 2019-03-24 NOTE — Progress Notes (Signed)
Subjective:  Feels well taking PO   Objective:  Vitals:   03/23/19 1918 03/24/19 0000 03/24/19 0352 03/24/19 0743  BP: 109/62 110/69 122/69 131/68  Pulse: 61 74 76 78  Resp: 20 20 18 15   Temp: 98.1 F (36.7 C) 99 F (37.2 C) 98.9 F (37.2 C) 98.3 F (36.8 C)  TempSrc: Oral Oral  Oral  SpO2: 97% 97% 96% 98%  Weight:      Height:        Intake/Output from previous day:  Intake/Output Summary (Last 24 hours) at 03/24/2019 0827 Last data filed at 03/23/2019 1000 Gross per 24 hour  Intake 500 ml  Output --  Net 500 ml    Physical Exam: Affect appropriate Elderly hoarse male  HEENT: normal Neck supple with no adenopathy Left port a cath IJ /subclavian  Lungs clear with no wheezing and good diaphragmatic motion Heart:  S1/S2 no murmur, no rub, gallop or click PMI normal Abdomen: post lap choly BS positive  no bruit.  No HSM or HJR Distal pulses intact with no bruits No edema Neuro non-focal Skin warm and dry No muscular weakness   Lab Results: Basic Metabolic Panel: Recent Labs    03/22/19 0428 03/23/19 0453  NA 138 136  K 3.3* 3.9  CL 101 101  CO2 25 26  GLUCOSE 152* 104*  BUN 12 15  CREATININE 1.12 1.30*  CALCIUM 9.4 8.5*   Liver Function Tests: Recent Labs    03/22/19 0707 03/23/19 0453  AST 19 22  ALT 16 17  ALKPHOS 43 40  BILITOT 0.8 1.4*  PROT 5.8* 5.4*  ALBUMIN 3.6 3.0*   Recent Labs    03/22/19 0707  LIPASE 37   CBC: Recent Labs    03/22/19 0434 03/23/19 0453  WBC 11.2* 21.6*  NEUTROABS 9.6*  --   HGB 14.7 14.1  HCT 42.1 41.3  MCV 92.9 94.5  PLT 184 184   Cardiac Enzymes: No results for input(s): CKTOTAL, CKMB, CKMBINDEX, TROPONINI in the last 72 hours. BNP: Invalid input(s): POCBNP D-Dimer: No results for input(s): DDIMER in the last 72 hours. Hemoglobin A1C: Recent Labs    03/22/19 0434  HGBA1C 5.8*   Imaging: ECHOCARDIOGRAM COMPLETE  Result Date: 03/22/2019   ECHOCARDIOGRAM REPORT   Patient Name:    Randy Hanson Date of Exam: 03/22/2019 Medical Rec #:  [id number]     Height:       72.0 in Accession #:    [id number]    Weight:       207.0 lb Date of Birth:  Jul 23, 1940    BSA:          2.16 m Patient Age:    79 years      BP:           105/67 mmHg Patient Gender: M             HR:           116 bpm. Exam Location:  Inpatient Procedure: 2D Echo                    STAT ECHO Reported to: Dr. Eden Emms on 03/22/2019 2:29:00 PM. Indications:    atrial fibrillation 427.31  History:        Patient has no prior history of Echocardiogram examinations.  Sonographer:    Delcie Roch Referring Phys: [id number] Cindi Ghazarian C Jeshawn Melucci IMPRESSIONS  1. Left ventricular ejection fraction, by visual estimation, is 65 to 70%.  The left ventricle has hyperdynamic function. There is no left ventricular hypertrophy.  2. Left ventricular diastolic parameters are consistent with Grade I diastolic dysfunction (impaired relaxation).  3. The left ventricle has no regional wall motion abnormalities.  4. Global right ventricle has normal systolic function.The right ventricular size is normal. No increase in right ventricular wall thickness.  5. Left atrial size was normal.  6. Right atrial size was normal.  7. Mild to moderate mitral annular calcification.  8. The mitral valve is normal in structure. No evidence of mitral valve regurgitation. No evidence of mitral stenosis.  9. The tricuspid valve is normal in structure. 10. The aortic valve is normal in structure. Aortic valve regurgitation is not visualized. Mild aortic valve sclerosis without stenosis. 11. The pulmonic valve was normal in structure. Pulmonic valve regurgitation is trivial. 12. The inferior vena cava is normal in size with greater than 50% respiratory variability, suggesting right atrial pressure of 3 mmHg. FINDINGS  Left Ventricle: Left ventricular ejection fraction, by visual estimation, is 65 to 70%. The left ventricle has hyperdynamic function. The left ventricle has no regional  wall motion abnormalities. There is no left ventricular hypertrophy. Left ventricular diastolic parameters are consistent with Grade I diastolic dysfunction (impaired relaxation). Normal left atrial pressure. Right Ventricle: The right ventricular size is normal. No increase in right ventricular wall thickness. Global RV systolic function is has normal systolic function. Left Atrium: Left atrial size was normal in size. Right Atrium: Right atrial size was normal in size Pericardium: There is no evidence of pericardial effusion. Mitral Valve: The mitral valve is normal in structure. Mild to moderate mitral annular calcification. No evidence of mitral valve regurgitation. No evidence of mitral valve stenosis by observation. Tricuspid Valve: The tricuspid valve is normal in structure. Tricuspid valve regurgitation is not demonstrated. Aortic Valve: The aortic valve is normal in structure. Aortic valve regurgitation is not visualized. Mild aortic valve sclerosis is present, with no evidence of aortic valve stenosis. Pulmonic Valve: The pulmonic valve was normal in structure. Pulmonic valve regurgitation is trivial. Pulmonic regurgitation is trivial. Aorta: The aortic root, ascending aorta and aortic arch are all structurally normal, with no evidence of dilitation or obstruction. Venous: The inferior vena cava is normal in size with greater than 50% respiratory variability, suggesting right atrial pressure of 3 mmHg. IAS/Shunts: No atrial level shunt detected by color flow Doppler. There is no evidence of a patent foramen ovale. No ventricular septal defect is seen or detected. There is no evidence of an atrial septal defect.  LEFT VENTRICLE PLAX 2D LVIDd:         4.00 cm LVIDs:         3.00 cm LV PW:         1.10 cm LV IVS:        1.10 cm LVOT diam:     2.00 cm LV SV:         35 ml LV SV Index:   15.90 LVOT Area:     3.14 cm  RIGHT VENTRICLE RV S prime:     18.70 cm/s TAPSE (M-mode): 2.0 cm LEFT ATRIUM              Index       RIGHT ATRIUM           Index LA diam:        3.10 cm 1.43 cm/m  RA Area:     17.20 cm LA Vol (A2C):   29.5  ml 13.65 ml/m RA Volume:   46.30 ml  21.42 ml/m LA Vol (A4C):   51.5 ml 23.82 ml/m LA Biplane Vol: 42.7 ml 19.75 ml/m  AORTIC VALVE LVOT Vmax:   121.00 cm/s LVOT Vmean:  79.100 cm/s LVOT VTI:    0.196 m  AORTA Ao Root diam: 3.70 cm  SHUNTS Systemic VTI:  0.20 m Systemic Diam: 2.00 cm  Donato Schultz MD Electronically signed by Donato Schultz MD Signature Date/Time: 03/22/2019/2:40:59 PM    Final     Cardiac Studies:  ECG: afib nonspecific ST changes chronic    Telemetry:  afib   Echo: EF 65-70% no significant valve disease   Medications:   . chlorhexidine  15 mL Mouth/Throat BID  . docusate sodium  100 mg Oral BID  . enoxaparin (LOVENOX) injection  40 mg Subcutaneous Q24H  . metoprolol tartrate  25 mg Oral TID  . pantoprazole (PROTONIX) IV  40 mg Intravenous QHS  . predniSONE  10 mg Oral Q breakfast  . sodium chloride flush  3 mL Intravenous Once  . terazosin  2 mg Oral QHS     . 0.9 % NaCl with KCl 20 mEq / L 75 mL/hr at 03/23/19 1715  . cefTRIAXone (ROCEPHIN)  IV 2 g (03/23/19 1127)    Assessment/Plan:   1. Afib:  Will need anticoagulation post surgery CHADVASC 3 On oral lopressor for rate control Suspect epigastric / back pain     related to GB disease and not CADCan [facility] with no RWMA and normal EF Continue Rocephin for cholecystitis  Will start anticoagulation in am if ok with surgery    Charlton Haws 03/24/2019, 8:27 AM

## 2019-03-24 NOTE — Consult Note (Addendum)
Urology Consult   Physician requesting consult: Dr. Nedra Hai  Reason for consult: Hematuria  History of Present Illness: Randy Hanson is a 79 y.o. who presented to the hospital with gangrenous cholecystitis. He developed painless gross hematuria on Monday and denied any previous history of hematuria.  He underwent a laparoscopic cholecystectomy yesterday.  His hematuria persisted into this morning and his urine was cherry red per report.  However, he states this has continued to improve and was clear during his last void.  His UA only demonstrated 6-10 RBCs without evidence of infection.  He has denies any other new LUTS.  He is chronically treated with terazosin for BPH (nocturia).  He developed atrial fibrillation postoperatively and needs to be anticoagulated.    He does have a distant history of smoking.  He smoked for 15 years but quit 50 years ago.  He has no family history of GU malignancy.  He denies a history of hematuria, UTIs, STDs, urolithiasis, GU malignancy/trauma/surgery.  Past Medical History:  Diagnosis Date  . Back pain   . BPH (benign prostatic hyperplasia)   . Cancer of tonsil, pal[place] ([place]) 07/12/2015  . DJD (degenerative joint disease)   . Esophageal reflux   . FHx: colonic polyps   . Hemorrhoids   . History of radiation therapy 08/28/15-- 10/16/15   Right Tonsil and bilateral neck  . Hypercholesteremia   . Hyperlipemia   . Hypertension   . Kidney cysts    [facility] [place] [facility]  . Posterior vitreous detachment, left eye    DR. KATHERINE HECKER  AUGUST 2011  . Radiation 08/28/15- 10/16/15   Right Tonsil and Bilateral Neck    Past Surgical History:  Procedure Laterality Date  . CARDIAC CATHETERIZATION     remote >15 years ago  . CHOLECYSTECTOMY N/A 03/23/2019   Procedure: LAPAROSCOPIC CHOLECYSTECTOMY;  Surgeon: Coralie Keens, MD;  Location: [facility];  Service: General;  Laterality: N/A;  . IR GASTROSTOMY TUBE REMOVAL  02/24/2017  . IR  GENERIC HISTORICAL  10/11/2015   IR PATIENT EVAL TECH 0-60 MINS 10/11/2015 Darrell K Allred, PA-C [facility]-INTERV RAD  . IR PATIENT EVAL TECH 0-60 MINS  07/05/2016  . IR PATIENT EVAL TECH 0-60 MINS  08/14/2016  . IR PATIENT EVAL TECH 0-60 MINS  12/09/2016  . IR REMOVAL TUN ACCESS W/ PORT W/O FL MOD SED  07/22/2016  . [facility] Medications:  Home Meds:  No current facility-administered medications on file prior to encounter.   Current Outpatient Medications on File Prior to Encounter  Medication Sig Dispense Refill  . chlorhexidine (PERIDEX) 0.12 % solution Use as directed 15 mLs in the mouth or throat 2 (two) times daily. Mouth rinse    . predniSONE (DELTASONE) 10 MG tablet Take 10 mg by mouth daily with breakfast.    . terazosin (HYTRIN) 2 MG capsule Take 2 mg by mouth at bedtime.       Scheduled Meds: . chlorhexidine  15 mL Mouth/Throat BID  . docusate sodium  100 mg Oral BID  . enoxaparin (LOVENOX) injection  40 mg Subcutaneous Q24H  . metoprolol tartrate  25 mg Oral TID  . pantoprazole  40 mg Oral QHS  . predniSONE  10 mg Oral Q breakfast  . sodium chloride flush  3 mL Intravenous Once  . terazosin  2 mg Oral QHS   Continuous Infusions: . 0.9 % NaCl with KCl 20 mEq / L 75 mL/hr at  03/24/19 1218  . cefTRIAXone (ROCEPHIN)  IV 2 g (03/24/19 1224)   PRN Meds:.acetaminophen **OR** acetaminophen, diphenhydrAMINE **OR** diphenhydrAMINE, HYDROmorphone (DILAUDID) injection, methocarbamol, metoprolol tartrate, ondansetron **OR** ondansetron (ZOFRAN) IV, oxyCODONE, polyethylene glycol, simethicone  Allergies:  Allergies  Allergen Reactions  . Lisinopril Cough  . Acyclovir And Related Other (See Comments)    Fever, chills   . Famciclovir Other (See Comments)    Fever, chills    Family History  Problem Relation Age of Onset  . Hypertension Mother     Social History:  reports that he has quit smoking. He has never used smokeless tobacco. He reports that he does  not drink alcohol or use drugs.  ROS: A complete review of systems was performed.  All systems are negative except for pertinent findings as noted.  Physical Exam:  Vital signs in last 24 hours: Temp:  [98.1 F (36.7 C)-99 F (37.2 C)] 98.1 F (36.7 C) (01/13 1435) Pulse Rate:  [74-78] 75 (01/13 1435) Resp:  [15-20] 17 (01/13 1435) BP: (110-131)/(68-72) 124/72 (01/13 1435) SpO2:  [96 %-98 %] 97 % (01/13 1435) Constitutional:  Alert and oriented, No acute distress Cardiovascular: No JVD Respiratory: Normal respiratory effort GI: Abdomen is soft, nontender, nondistended, no abdominal masses, incisions healing well GU: No CVA tenderness Lymphatic: No lymphadenopathy Neurologic: Grossly intact, no focal deficits Psychiatric: Normal mood and affect  Laboratory Data:  Recent Labs    03/22/19 0434 03/23/19 0453 03/24/19 0949  WBC 11.2* 21.6* 21.8*  HGB 14.7 14.1 13.3  HCT 42.1 41.3 39.8  PLT 184 184 165    Recent Labs    03/22/19 0428 03/23/19 0453 03/24/19 0949  NA 138 136 137  K 3.3* 3.9 4.3  CL 101 101 106  GLUCOSE 152* 104* 158*  BUN 12 15 16   CALCIUM 9.4 8.5* 8.7*  CREATININE 1.12 1.30* 1.04     Results for orders placed or performed during the hospital encounter of 03/22/19 (from the past 24 hour(s))  CBC     Status: Abnormal   Collection Time: 03/24/19  9:49 AM  Result Value Ref Range   WBC 21.8 (H) 4.0 - 10.5 K/uL   RBC 4.15 (L) 4.22 - 5.81 MIL/uL   Hemoglobin 13.3 13.0 - 17.0 g/dL   HCT 39.8 39.0 - 52.0 %   MCV 95.9 80.0 - 100.0 fL   MCH 32.0 26.0 - 34.0 pg   MCHC 33.4 30.0 - 36.0 g/dL   RDW 12.9 11.5 - 15.5 %   Platelets 165 150 - 400 K/uL   nRBC 0.0 0.0 - 0.2 %  Comprehensive metabolic panel     Status: Abnormal   Collection Time: 03/24/19  9:49 AM  Result Value Ref Range   Sodium 137 135 - 145 mmol/L   Potassium 4.3 3.5 - 5.1 mmol/L   Chloride 106 98 - 111 mmol/L   CO2 23 22 - 32 mmol/L   Glucose, Bld 158 (H) 70 - 99 mg/dL   BUN 16 8 - 23  mg/dL   Creatinine, Ser 1.04 0.61 - 1.24 mg/dL   Calcium 8.7 (L) 8.9 - 10.3 mg/dL   Total Protein 5.3 (L) 6.5 - 8.1 g/dL   Albumin 2.7 (L) 3.5 - 5.0 g/dL   AST 36 15 - 41 U/L   ALT 29 0 - 44 U/L   Alkaline Phosphatase 48 38 - 126 U/L   Total Bilirubin 0.7 0.3 - 1.2 mg/dL   GFR calc non Af Amer >60 >60 mL/min  GFR calc Af Amer >60 >60 mL/min   Anion gap 8 5 - 15  Urinalysis, Routine w reflex microscopic     Status: Abnormal   Collection Time: 03/24/19  2:10 PM  Result Value Ref Range   Color, Urine YELLOW YELLOW   APPearance CLEAR CLEAR   Specific Gravity, Urine 1.008 1.005 - 1.030   pH 6.0 5.0 - 8.0   Glucose, UA NEGATIVE NEGATIVE mg/dL   Hgb urine dipstick MODERATE (A) NEGATIVE   Bilirubin Urine NEGATIVE NEGATIVE   Ketones, ur NEGATIVE NEGATIVE mg/dL   Protein, ur NEGATIVE NEGATIVE mg/dL   Nitrite NEGATIVE NEGATIVE   Leukocytes,Ua NEGATIVE NEGATIVE   RBC / HPF 6-10 0 - 5 RBC/hpf   WBC, UA 0-5 0 - 5 WBC/hpf   Bacteria, UA NONE SEEN NONE SEEN   Squamous Epithelial / LPF 0-5 0 - 5   Mucus PRESENT    Recent Results (from the past 240 hour(s))  Respiratory Panel by RT PCR (Flu A&B, Covid) - Nasopharyngeal Swab     Status: None   Collection Time: 03/22/19 11:23 AM   Specimen: Nasopharyngeal Swab  Result Value Ref Range Status   SARS Coronavirus 2 by RT PCR NEGATIVE NEGATIVE Final    Comment: (NOTE) SARS-CoV-2 target nucleic acids are NOT DETECTED. The SARS-CoV-2 RNA is generally detectable in upper respiratoy specimens during the acute phase of infection. The lowest concentration of SARS-CoV-2 viral copies this assay can detect is 131 copies/mL. A negative result does not preclude SARS-Cov-2 infection and should not be used as the sole basis for treatment or other patient management decisions. A negative result may occur with  improper specimen collection/handling, submission of specimen other than nasopharyngeal swab, presence of viral mutation(s) within the areas  targeted by this assay, and inadequate number of viral copies (<131 copies/mL). A negative result must be combined with clinical observations, patient history, and epidemiological information. The expected result is Negative. Fact Sheet for Patients:  PinkCheek.be Fact Sheet for Healthcare Providers:  GravelBags.it This test is not yet ap proved or cleared by the [place] FDA and  has been authorized for detection and/or diagnosis of SARS-CoV-2 by FDA under an Emergency Use Authorization (EUA). This EUA will remain  in effect (meaning this test can be used) for the duration of the COVID-19 declaration under Section 564(b)(1) of the Act, 21 U.S.C. section 360bbb-3(b)(1), unless the authorization is terminated or revoked sooner.    Influenza A by PCR NEGATIVE NEGATIVE Final   Influenza B by PCR NEGATIVE NEGATIVE Final    Comment: (NOTE) The Xpert Xpress SARS-CoV-2/FLU/RSV assay is intended as an aid in  the diagnosis of influenza from Nasopharyngeal swab specimens and  should not be used as a sole basis for treatment. Nasal washings and  aspirates are unacceptable for Xpert Xpress SARS-CoV-2/FLU/RSV  testing. Fact Sheet for Patients: PinkCheek.be Fact Sheet for Healthcare Providers: GravelBags.it This test is not yet approved or cleared by the [place] FDA and  has been authorized for detection and/or diagnosis of SARS-CoV-2 by  FDA under an Emergency Use Authorization (EUA). This EUA will remain  in effect (meaning this test can be used) for the duration of the  Covid-19 declaration under Section 564(b)(1) of the Act, 21  U.S.C. section 360bbb-3(b)(1), unless the authorization is  terminated or revoked. Performed at [facility], [place] [address] [place], [place] [address]   MRSA PCR Screening     Status: None   Collection Time: 03/22/19 10:45 PM  Specimen: Nasal Mucosa; Nasopharyngeal  Result Value Ref Range Status   MRSA by PCR NEGATIVE NEGATIVE Final    Comment:        The GeneXpert MRSA Assay (FDA approved for NASAL specimens only), is one component of a comprehensive MRSA colonization surveillance program. It is not intended to diagnose MRSA infection nor to guide or monitor treatment for MRSA infections. Performed at [facility], [place] [address] [place], [place] [address]     Renal Function: Recent Labs    03/22/19 0428 03/23/19 0453 03/24/19 0949  CREATININE 1.12 1.30* 1.04   Estimated Creatinine Clearance: 72.4 mL/min (by C-G formula based on SCr of 1.04 mg/dL).  Radiologic Imaging: US RENAL  Result Date: 03/24/2019 CLINICAL DATA:  Gross hematuria starting last night. EXAM: RENAL / URINARY TRACT ULTRASOUND COMPLETE COMPARISON:  Ultrasound, 06/28/2016. FINDINGS: Right Kidney: Renal measurements: 10.8 x 5.644.9 cm = volume: 154.3 mL. Normal parenchymal echogenicity. Mild renal cortical thinning. Small exophytic cyst, midpole, 9 x 10 x 10 mm. No other masses, no stones and no hydronephrosis. Left Kidney: Renal measurements: 11.5 x 5.9 x 5.2 cm = volume: 184.2 mL. Normal parenchymal echogenicity. Mild lower pole renal cortical thinning. Cystic mass arises from the lower pole with multiple partly imaged septations and a mildly lobulated to serrated margin. It measures 5.0 x 4.5 x 5.5 cm. Midpole cyst measuring 2 x 2.3 x 2.3 cm no significant complicating features. Lower pole shadowing stone measuring 7 mm. No hydronephrosis. Bladder: Appears normal for degree of bladder distention. Other: None. IMPRESSION: 1. No acute findings.  No hydronephrosis. 2. Renal cysts. There is a complicated cyst arising from the lower pole the left kidney, which is not completely characterized sonographically. It is, however, similar appearance and size to the prior ultrasound strongly supporting a benign process. 3. Nonobstructing stone in  the lower pole the left kidney. 4. Normal appearance of the bladder. Cause of the patient's gross hematuria is not evident on this exam. Electronically Signed   By: Lajean Manes M.D.   On: 03/24/2019 11:08    I independently reviewed the above imaging studies.  Impression/Recommendation 1. Gross hematuria: His hematuria is clearing.  He will need this evaluated as an outpatient with imaging and cystoscopy and I will arrange outpatient f/u for this purpose.  It is ok to begin anticoagulation in the meantime.  If he develops worsening hematuria to the point that he is passing blood clots or having difficulty emptying his bladder, please notify us and we can address his hematuria further if more acute intervention is required.  Dutch Gray 03/24/2019, 8:12 PM    Pryor Curia MD   CC: Dr. Nedra Hai

## 2019-03-24 NOTE — Plan of Care (Signed)
  Problem: Education: Goal: Knowledge of General Education information will improve Description: Including pain rating scale, medication(s)/side effects and non-pharmacologic comfort measures Outcome: Progressing   Problem: Health Behavior/Discharge Planning: Goal: Ability to manage health-related needs will improve Outcome: Progressing   Problem: Clinical Measurements: Goal: Will remain free from infection Outcome: Progressing   Problem: Elimination: Goal: Will not experience complications related to bowel motility Outcome: Progressing Goal: Will not experience complications related to urinary retention Outcome: Progressing   Problem: Pain Managment: Goal: General experience of comfort will improve Outcome: Progressing   Problem: Safety: Goal: Ability to remain free from injury will improve Outcome: Progressing   Problem: Skin Integrity: Goal: Risk for impaired skin integrity will decrease Outcome: Progressing

## 2019-03-24 NOTE — Plan of Care (Signed)
  Problem: Education: Goal: Knowledge of General Education information will improve Description: Including pain rating scale, medication(s)/side effects and non-pharmacologic comfort measures Outcome: Not Progressing   Problem: Health Behavior/Discharge Planning: Goal: Ability to manage health-related needs will improve Outcome: Not Progressing   

## 2019-03-24 NOTE — Plan of Care (Signed)
  Problem: Education: Goal: Knowledge of General Education information will improve Description: Including pain rating scale, medication(s)/side effects and non-pharmacologic comfort measures Outcome: Progressing   Problem: Health Behavior/Discharge Planning: Goal: Ability to manage health-related needs will improve Outcome: Progressing   Problem: Clinical Measurements: Goal: Ability to maintain clinical measurements within normal limits will improve Outcome: Adequate for Discharge Goal: Will remain free from infection Outcome: Adequate for Discharge

## 2019-03-24 NOTE — Progress Notes (Signed)
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 Magnus Ivan, 01/12 - am labs pending Hematuria - new since admission, have not had before - renal US and UA pending - H/o BPH, on terazosin A. Fib - card following and appreciate their assistance - anticoagulation at discharge, okay to start if Hgb is stable on pending CBC - continue lopressor for rate control per cards  ID - rocephin, will need a total of 7 days of abx will dc on Augmentin VTE - SCDs, lovenox FEN - IVF, soft diet Foley - none, did not have one for OR Follow up - [facility] 2 weeks    LOS: 1 day    Subjective: CC: abdominal soreness  No nausea or vomiting. Pt is tolerating a diet and having flatus. He had blood in his urine this am. He states he didn't urinate most of the day Monday and when he did Monday evening there was blood in his urine. Resolved yesterday but returned this am. He states he has never had blood in his urine before. I discussed diagnosis of a fib.   Objective: Vital signs in last 24 hours: Temp:  [97.5 F (36.4 C)-99 F (37.2 C)] 98.3 F (36.8 C) (01/13 0743) Pulse Rate:  [61-91] 78 (01/13 0743) Resp:  [15-23] 15 (01/13 0743) BP: (107-133)/(56-76) 131/68 (01/13 0743) SpO2:  [93 %-98 %] 98 % (01/13 0743) Last BM Date: 03/21/19  Intake/Output from previous day: 01/12 0701 - 01/13 0700 In: 500 [I.V.:500] Out: -  Intake/Output this shift: No intake/output data recorded.  PE:  Gen:  Alert, NAD, pleasant, cooperative Card:  Irregular rhythm, regular rate Pulm:  CTA, no W/R/R, rate and effort normal Abd: Soft, ND, +BS, incisions with glue intact are well appearing and are without drainage or signs of infection. Mild TTP around incisions  without guarding. No peritonitis.  Skin: no rashes noted, warm and dry   Anti-infectives: Anti-infectives (From admission, onward)   Start     Dose/Rate Route Frequency Ordered Stop   03/22/19 1200  cefTRIAXone (ROCEPHIN) 2 g in sodium chloride 0.9 % 100 mL IVPB     2 g 200 mL/hr over 30 Minutes Intravenous Every 24 hours 03/22/19 1151        Lab Results:  Recent Labs    03/22/19 0434 03/23/19 0453  WBC 11.2* 21.6*  HGB 14.7 14.1  HCT 42.1 41.3  PLT 184 184   BMET Recent Labs    03/22/19 0428 03/23/19 0453  NA 138 136  K 3.3* 3.9  CL 101 101  CO2 25 26  GLUCOSE 152* 104*  BUN 12 15  CREATININE 1.12 1.30*  CALCIUM 9.4 8.5*   PT/INR Recent Labs    03/22/19 0428  LABPROT 12.8  INR 1.0   CMP     Component Value Date/Time   NA 136 03/23/2019 0453   NA 146 (H) 02/20/2017 1007   NA 140 07/04/2016 1014   K 3.9 03/23/2019 0453   K 4.7 02/20/2017 1007   K 4.7 07/04/2016 1014   CL 101 03/23/2019 0453   CL 104 02/20/2017 1007   CO2 26 03/23/2019 0453   CO2 31 02/20/2017 1007   CO2 27 07/04/2016 1014   GLUCOSE 104 (H) 03/23/2019 0453   GLUCOSE 107 02/20/2017 1007  BUN 15 03/23/2019 0453   BUN 14 02/20/2017 1007   BUN 22.1 07/04/2016 1014   CREATININE 1.30 (H) 03/23/2019 0453   CREATININE 1.10 10/26/2018 0920   CREATININE 1.1 02/20/2017 1007   CREATININE 1.0 07/04/2016 1014   CALCIUM 8.5 (L) 03/23/2019 0453   CALCIUM 9.8 02/20/2017 1007   CALCIUM 10.6 (H) 07/04/2016 1014   PROT 5.4 (L) 03/23/2019 0453   PROT 6.9 02/20/2017 1007   PROT 7.5 07/04/2016 1014   ALBUMIN 3.0 (L) 03/23/2019 0453   ALBUMIN 3.5 02/20/2017 1007   ALBUMIN 4.0 07/04/2016 1014   AST 22 03/23/2019 0453   AST 16 10/26/2018 0920   AST 28 07/04/2016 1014   ALT 17 03/23/2019 0453   ALT 11 10/26/2018 0920   ALT 17 02/20/2017 1007   ALT 55 07/04/2016 1014   ALKPHOS 40 03/23/2019 0453   ALKPHOS 72 02/20/2017 1007   ALKPHOS 255 (H) 07/04/2016 1014   BILITOT 1.4 (H) 03/23/2019 0453    BILITOT 0.6 10/26/2018 0920   BILITOT 0.80 07/04/2016 1014   GFRNONAA 53 (L) 03/23/2019 0453   GFRNONAA >60 10/26/2018 0920   GFRAA >60 03/23/2019 0453   GFRAA >60 10/26/2018 0920   Lipase     Component Value Date/Time   LIPASE 37 03/22/2019 0707    Studies/Results: ECHOCARDIOGRAM COMPLETE  Result Date: 03/22/2019   ECHOCARDIOGRAM REPORT   Patient Name:   Darrold Junker Date of Exam: 03/22/2019 Medical Rec #:  [id number]     Height:       72.0 in Accession #:    [id number]    Weight:       207.0 lb Date of Birth:  Aug 06, 1940    BSA:          2.16 m Patient Age:    79 years      BP:           105/67 mmHg Patient Gender: M             HR:           116 bpm. Exam Location:  Inpatient Procedure: 2D Echo                    STAT ECHO Reported to: Dr. Eden Emms on 03/22/2019 2:29:00 PM. Indications:    atrial fibrillation 427.31  History:        Patient has no prior history of Echocardiogram examinations.  Sonographer:    Delcie Roch Referring Phys: [id number] PETER C NISHAN IMPRESSIONS  1. Left ventricular ejection fraction, by visual estimation, is 65 to 70%. The left ventricle has hyperdynamic function. There is no left ventricular hypertrophy.  2. Left ventricular diastolic parameters are consistent with Grade I diastolic dysfunction (impaired relaxation).  3. The left ventricle has no regional wall motion abnormalities.  4. Global right ventricle has normal systolic function.The right ventricular size is normal. No increase in right ventricular wall thickness.  5. Left atrial size was normal.  6. Right atrial size was normal.  7. Mild to moderate mitral annular calcification.  8. The mitral valve is normal in structure. No evidence of mitral valve regurgitation. No evidence of mitral stenosis.  9. The tricuspid valve is normal in structure. 10. The aortic valve is normal in structure. Aortic valve regurgitation is not visualized. Mild aortic valve sclerosis without stenosis. 11. The pulmonic valve was normal  in structure. Pulmonic valve regurgitation is trivial. 12. The inferior vena cava is normal in size with greater than  50% respiratory variability, suggesting right atrial pressure of 3 mmHg. FINDINGS  Left Ventricle: Left ventricular ejection fraction, by visual estimation, is 65 to 70%. The left ventricle has hyperdynamic function. The left ventricle has no regional wall motion abnormalities. There is no left ventricular hypertrophy. Left ventricular diastolic parameters are consistent with Grade I diastolic dysfunction (impaired relaxation). Normal left atrial pressure. Right Ventricle: The right ventricular size is normal. No increase in right ventricular wall thickness. Global RV systolic function is has normal systolic function. Left Atrium: Left atrial size was normal in size. Right Atrium: Right atrial size was normal in size Pericardium: There is no evidence of pericardial effusion. Mitral Valve: The mitral valve is normal in structure. Mild to moderate mitral annular calcification. No evidence of mitral valve regurgitation. No evidence of mitral valve stenosis by observation. Tricuspid Valve: The tricuspid valve is normal in structure. Tricuspid valve regurgitation is not demonstrated. Aortic Valve: The aortic valve is normal in structure. Aortic valve regurgitation is not visualized. Mild aortic valve sclerosis is present, with no evidence of aortic valve stenosis. Pulmonic Valve: The pulmonic valve was normal in structure. Pulmonic valve regurgitation is trivial. Pulmonic regurgitation is trivial. Aorta: The aortic root, ascending aorta and aortic arch are all structurally normal, with no evidence of dilitation or obstruction. Venous: The inferior vena cava is normal in size with greater than 50% respiratory variability, suggesting right atrial pressure of 3 mmHg. IAS/Shunts: No atrial level shunt detected by color flow Doppler. There is no evidence of a patent foramen ovale. No ventricular septal defect  is seen or detected. There is no evidence of an atrial septal defect.  LEFT VENTRICLE PLAX 2D LVIDd:         4.00 cm LVIDs:         3.00 cm LV PW:         1.10 cm LV IVS:        1.10 cm LVOT diam:     2.00 cm LV SV:         35 ml LV SV Index:   15.90 LVOT Area:     3.14 cm  RIGHT VENTRICLE RV S prime:     18.70 cm/s TAPSE (M-mode): 2.0 cm LEFT ATRIUM             Index       RIGHT ATRIUM           Index LA diam:        3.10 cm 1.43 cm/m  RA Area:     17.20 cm LA Vol (A2C):   29.5 ml 13.65 ml/m RA Volume:   46.30 ml  21.42 ml/m LA Vol (A4C):   51.5 ml 23.82 ml/m LA Biplane Vol: 42.7 ml 19.75 ml/m  AORTIC VALVE LVOT Vmax:   121.00 cm/s LVOT Vmean:  79.100 cm/s LVOT VTI:    0.196 m  AORTA Ao Root diam: 3.70 cm  SHUNTS Systemic VTI:  0.20 m Systemic Diam: 2.00 cm  Donato Schultz MD Electronically signed by Donato Schultz MD Signature Date/Time: 03/22/2019/2:40:59 PM    Final      Jerre Simon, [facility] Please see amion for pager for the following: M, T, W, & Friday 7:00am - 4:30pm Thursdays 7:00am -11:30am

## 2019-03-24 NOTE — Progress Notes (Signed)
PHARMACIST - PHYSICIAN COMMUNICATION  Randy Hanson, Randy Hanson   CONCERNING: IV to Oral Route Change Policy  RECOMMENDATION: This patient is receiving Pantoprazole by the intravenous route.  Based on criteria approved by the Pharmacy and Therapeutics Committee, the intravenous medication(s) is/are being converted to the equivalent oral dose form(s).   DESCRIPTION: These criteria include:  The patient is eating (either orally or via tube) and/or has been taking other orally administered medications for a least 24 hours  The patient has no evidence of active gastrointestinal bleeding or impaired GI absorption (gastrectomy, short bowel, patient on TNA or NPO).  If you have questions about this conversion, please contact the Pharmacy Department  []   [phone] )  Randy Hanson []   [phone] )  Randy Hanson [x]   [phone] )  Randy Hanson []   [phone] )  [facility] []   [phone] )  [place], [facility] 03/24/2019 10:53 AM

## 2019-03-25 ENCOUNTER — Telehealth: Payer: Self-pay | Admitting: Cardiovascular Disease

## 2019-03-25 LAB — BASIC METABOLIC PANEL
Anion gap: 7 (ref 5–15)
BUN: 16 mg/dL (ref 8–23)
CO2: 25 mmol/L (ref 22–32)
Calcium: 8.3 mg/dL — ABNORMAL LOW (ref 8.9–10.3)
Chloride: 107 mmol/L (ref 98–111)
Creatinine, Ser: 0.99 mg/dL (ref 0.61–1.24)
GFR calc Af Amer: >60 mL/min (ref >60)
GFR calc non Af Amer: >60 mL/min (ref >60)
Glucose, Bld: 101 mg/dL — ABNORMAL HIGH (ref 70–99)
Potassium: 3.9 mmol/L (ref 3.5–5.1)
Sodium: 139 mmol/L (ref 135–145)

## 2019-03-25 LAB — CBC
HCT: 38.4 % — ABNORMAL LOW (ref 39.0–52.0)
Hemoglobin: 12.8 g/dL — ABNORMAL LOW (ref 13.0–17.0)
MCH: 32.1 pg (ref 26.0–34.0)
MCHC: 33.3 g/dL (ref 30.0–36.0)
MCV: 96.2 fL (ref 80.0–100.0)
Platelets: 166 10*3/uL (ref 150–400)
RBC: 3.99 MIL/uL — ABNORMAL LOW (ref 4.22–5.81)
RDW: 12.9 % (ref 11.5–15.5)
WBC: 13.5 10*3/uL — ABNORMAL HIGH (ref 4.0–10.5)
nRBC: 0 % (ref 0.0–0.2)

## 2019-03-25 MED ORDER — METOPROLOL TARTRATE 25 MG PO TABS: 25.0000 mg | ORAL_TABLET | Freq: Three times a day (TID) | ORAL | 1 refills | Status: DC

## 2019-03-25 MED ORDER — POLYETHYLENE GLYCOL 3350 17 G PO PACK: 17.0000 g | PACK | Freq: Every day | ORAL | 0 refills | Status: DC | PRN

## 2019-03-25 MED ORDER — OXYCODONE HCL 5 MG PO TABS: 5.0000 mg | ORAL_TABLET | Freq: Four times a day (QID) | ORAL | 0 refills | Status: DC | PRN

## 2019-03-25 NOTE — Telephone Encounter (Signed)
New Message  Per Dr. Johnsie Cancel schedule [facility] hospital follow up visit with Truitt Merle on 04/07/2019 at 10:00 am

## 2019-03-25 NOTE — Progress Notes (Signed)
Patient and spouse given discharge instructions, all personal belongings gathered and taken home with spouse.   Both patient and spouse denied further questions.  IV d/c, site clean and dry, no bleeding.

## 2019-03-25 NOTE — Discharge Summary (Signed)
[facility]/Trauma Discharge Summary   Patient ID: Randy Hanson MRN: [id number] DOB/AGE: Sep 16, 1940 79 y.o.  Admit date: 03/22/2019 Discharge date: 03/25/2019  Admitting Diagnosis: Biliary colic Possible early acute cholecystitis  Discharge Diagnosis Patient Active Problem List   Diagnosis Date Noted  . Acute cholecystitis 03/22/2019  . Carcinoma of tonsillar fossa (HCC) 07/19/2015  . 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 Abnormal EKG 04/22/2014  . Essential hypertension 04/22/2014  . Hyperlipidemia 04/22/2014    Consultants Cardiology, Dr. Eden Emms Urology, Dr. Laverle Patter  Imaging: US RENAL  Result Date: 03/24/2019 CLINICAL DATA:  Gross hematuria starting last night. EXAM: RENAL / URINARY TRACT ULTRASOUND COMPLETE COMPARISON:  Ultrasound, 06/28/2016. FINDINGS: Right Kidney: Renal measurements: 10.8 x 5.644.9 cm = volume: 154.3 mL. Normal parenchymal echogenicity. Mild renal cortical thinning. Small exophytic cyst, midpole, 9 x 10 x 10 mm. No other masses, no stones and no hydronephrosis. Left Kidney: Renal measurements: 11.5 x 5.9 x 5.2 cm = volume: 184.2 mL. Normal parenchymal echogenicity. Mild lower pole renal cortical thinning. Cystic mass arises from the lower pole with multiple partly imaged septations and a mildly lobulated to serrated margin. It measures 5.0 x 4.5 x 5.5 cm. Midpole cyst measuring 2 x 2.3 x 2.3 cm no significant complicating features. Lower pole shadowing stone measuring 7 mm. No hydronephrosis. Bladder: Appears normal for degree of bladder distention. Other: None. IMPRESSION: 1. No acute findings.  No hydronephrosis. 2. Renal cysts. There is a complicated cyst arising from the lower pole the left kidney, which is not completely characterized sonographically. It is, however, similar appearance and size to the prior ultrasound strongly supporting a benign process. 3. Nonobstructing stone in the lower pole the left kidney. 4. Normal appearance of  the bladder. Cause of the patient's gross hematuria is not evident on this exam. Electronically Signed   By: Amie Portland M.D.   On: 03/24/2019 11:08    Procedures Dr. 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 Pain is epigastric and radiates into his back, severe, nothing made it worse, morphine helped. This started around 2300 last night. Associated with nausea and emesis, No fever, or chills, no urinary or bowel issues, no blood in stools, no diarrhea, and no melena. He took an aspirin 325mg  but this did not help. He thought he was having a heart attack. He had pain between his shoulder blades 2 days ago while raking which resolved with rest. ED workup included u/s which shows cholelithiasis within a distended gallbladder and minimal pericholecystic fluid but no generalized wall thickening or sonographic Murphy sign. WBC 11.2. Lipase and LFTs WNL. Cardiac workup negative with normal troponins and no acute EKG changes. He was given morphine and ondansetron with some temporary relief. Due to persistent pain general surgery asked to see.  Of note: patient states he had a nuclear medicine study of his heart at the [place] a few weeks back and he was told he had changes on his EKG that showed an arrhthymia,per patient. Patient was unsure of the name. No follow up yet since the nuc med study.  Abdominal surgical history: G tube Anticoagulants: none Former smoker  Hospital Course:  Workup showed biliary cholic possible early acute cholecystitis.  Patient was admitted and cardiology asked to see in setting  of a fib. Patient was cleared for surgery and underwent procedure listed above. Intraoperative findings were Gangrenous cholecystitis with cholelithiasis. He had  gross hematuria on day of admission and POD#1. We asked Urology to see. US renal showed renal cysts but no cause of the hematuria was evident on [place]. He is to follow up with Urology as outpt. Hematuria improved. Per cardiology, he converted back to NSR so they did not start him on anticoagulation. He is to follow up with cardiology as output.    On POD#2, the patient was voiding well, tolerating diet, ambulating well, pain well controlled, vital signs stable, incisions c/d/i and felt stable for discharge home.  Patient will follow up as outlined below and knows to call with questions or concerns.     Patient was discharged in good condition.  The [place] Substance controlled database was reviewed prior to prescribing narcotic pain medication to this patient.  Physical Exam: Gen:  Alert, NAD, pleasant, cooperative Card:  Irregular rhythm, regular rate, 2+ radial pulses BL Pulm:  CTA, no W/R/R, rate and effort normal Abd: Soft, ND, +BS, incisions with glue intact are well appearing and are without drainage or signs of infection. Very mild TTP around incisions without guarding. No peritonitis.  Skin: no rashes noted, warm and dry  Allergies as of 03/25/2019      Reactions   Lisinopril Cough   Acyclovir And Related Other (See Comments)   Fever, chills   Famciclovir Other (See Comments)   Fever, chills      Medication List    TAKE these medications   chlorhexidine 0.12 % solution Commonly known as: PERIDEX Use as directed 15 mLs in the mouth or throat 2 (two) times daily. Mouth rinse   metoprolol tartrate 25 MG tablet Commonly known as: LOPRESSOR Take 1 tablet (25 mg total) by mouth 3 (three) times daily.   oxyCODONE 5 MG immediate release tablet Commonly known as: Oxy IR/ROXICODONE Take 1 tablet (5 mg total) by mouth every 6 (six) hours as needed for moderate pain.   polyethylene glycol 17 g packet Commonly known as: MIRALAX / GLYCOLAX Take 17 g by mouth daily as needed for  mild constipation.   predniSONE 10 MG tablet Commonly known as: DELTASONE Take 10 mg by mouth daily with breakfast.   terazosin 2 MG capsule Commonly known as: HYTRIN Take 2 mg by mouth at bedtime.        Follow-up Information    [facility] [place]. Call on 04/13/2019.   Specialty: General Surgery Why: Your appointment is 02/02 at 9:45am A provider will call you at the above appointment date/time. Please send photo of incisions to [email or web address] prior to your appointment. Contact information: [address] [place] [address] [phone]       Wendall Stade, MD. Call.   Specialty: Cardiology Why: his office should call you regarding a follow up appointment. If you do not hear from them in a few days give them a call. You should be seen in 2 weeks from discharge Contact information: [address] [place] [address] [phone]        Heloise Purpura, MD. Schedule an appointment as soon as possible for a visit in 2 week(s).   Specialty: Urology Why: within 2 weeks from discharge for further work up regarding blood in your urine Contact information: [address] [place] [address] [phone]  Signed: Kathyr[facility] 03/25/2019, 9:48 AM Please see amion for pager for the following: M, T, W, & Friday 7:00am - 4:30pm Thursdays 7:00am -11:30am

## 2019-03-25 NOTE — Progress Notes (Addendum)
Subjective:  Feels well taking PO Good urine output this am   Objective:  Vitals:   03/24/19 0743 03/24/19 1435 03/24/19 2044 03/25/19 0444  BP: 131/68 124/72 (!) 143/81 (!) 147/74  Pulse: 78 75 66 78  Resp: 15 17 20 17   Temp: 98.3 F (36.8 C) 98.1 F (36.7 C) 98.9 F (37.2 C) 97.9 F (36.6 C)  TempSrc: Oral Oral Oral Oral  SpO2: 98% 97% 96% 96%  Weight:      Height:        Intake/Output from previous day:  Intake/Output Summary (Last 24 hours) at 03/25/2019 [id number] Last data filed at 03/24/2019 1900 Gross per 24 hour  Intake 720 ml  Output 400 ml  Net 320 ml    Physical Exam: Affect appropriate Elderly hoarse male  HEENT: normal Neck supple with no adenopathy Left port a cath IJ /subclavian  Lungs clear with no wheezing and good diaphragmatic motion Heart:  S1/S2 no murmur, no rub, gallop or click PMI normal Abdomen: post lap choly BS positive  no bruit.  No HSM or HJR Distal pulses intact with no bruits No edema Neuro non-focal Skin warm and dry No muscular weakness   Lab Results: Basic Metabolic Panel: Recent Labs    03/24/19 0949 03/25/19 0547  NA 137 139  K 4.3 3.9  CL 106 107  CO2 23 25  GLUCOSE 158* 101*  BUN 16 16  CREATININE 1.04 0.99  CALCIUM 8.7* 8.3*   Liver Function Tests: Recent Labs    03/23/19 0453 03/24/19 0949  AST 22 36  ALT 17 29  ALKPHOS 40 48  BILITOT 1.4* 0.7  PROT 5.4* 5.3*  ALBUMIN 3.0* 2.7*   No results for input(s): LIPASE, AMYLASE in the last 72 hours. CBC: Recent Labs    03/24/19 0949 03/25/19 0547  WBC 21.8* 13.5*  HGB 13.3 12.8*  HCT 39.8 38.4*  MCV 95.9 96.2  PLT 165 166   Cardiac Enzymes: No results for input(s): CKTOTAL, CKMB, CKMBINDEX, TROPONINI in the last 72 hours. BNP: Invalid input(s): POCBNP D-Dimer: No results for input(s): DDIMER in the last 72 hours. Hemoglobin A1C: No results for input(s): HGBA1C in the last 72 hours. Imaging: US RENAL  Result Date: 03/24/2019 CLINICAL DATA:   Gross hematuria starting last night. EXAM: RENAL / URINARY TRACT ULTRASOUND COMPLETE COMPARISON:  Ultrasound, 06/28/2016. FINDINGS: Right Kidney: Renal measurements: 10.8 x 5.644.9 cm = volume: 154.3 mL. Normal parenchymal echogenicity. Mild renal cortical thinning. Small exophytic cyst, midpole, 9 x 10 x 10 mm. No other masses, no stones and no hydronephrosis. Left Kidney: Renal measurements: 11.5 x 5.9 x 5.2 cm = volume: 184.2 mL. Normal parenchymal echogenicity. Mild lower pole renal cortical thinning. Cystic mass arises from the lower pole with multiple partly imaged septations and a mildly lobulated to serrated margin. It measures 5.0 x 4.5 x 5.5 cm. Midpole cyst measuring 2 x 2.3 x 2.3 cm no significant complicating features. Lower pole shadowing stone measuring 7 mm. No hydronephrosis. Bladder: Appears normal for degree of bladder distention. Other: None. IMPRESSION: 1. No acute findings.  No hydronephrosis. 2. Renal cysts. There is a complicated cyst arising from the lower pole the left kidney, which is not completely characterized sonographically. It is, however, similar appearance and size to the prior ultrasound strongly supporting a benign process. 3. Nonobstructing stone in the lower pole the left kidney. 4. Normal appearance of the bladder. Cause of the patient's gross hematuria is not evident on this exam. Electronically  Signed   By: Lajean Manes M.D.   On: 03/24/2019 11:08    Cardiac Studies:  ECG: afib nonspecific ST changes chronic    Telemetry:  SR PAC/PVC   Echo: EF 65-70% no significant valve disease   Medications:   . chlorhexidine  15 mL Mouth/Throat BID  . docusate sodium  100 mg Oral BID  . enoxaparin (LOVENOX) injection  40 mg Subcutaneous Q24H  . metoprolol tartrate  25 mg Oral TID  . pantoprazole  40 mg Oral QHS  . predniSONE  10 mg Oral Q breakfast  . sodium chloride flush  3 mL Intravenous Once  . terazosin  2 mg Oral QHS     . 0.9 % NaCl with KCl 20 mEq / L 75  mL/hr at 03/24/19 1218  . cefTRIAXone (ROCEPHIN)  IV 2 g (03/24/19 1224)    Assessment/Plan:   1. Afib:  CHADVASC 3 patient has converted to NSR. Given issues with hematuria and recent surgery for gangrenous gallbladder will not start anticoagulation Discussed need to f/u with his cardiologist at [facility] Would do 30 day event monitor and if he has recurrent PAF can consider starting AAT and eliquis Continue oral beta blocker for now Seems to be recovering well from surgery     Jenkins Rouge 03/25/2019, 8:11 AM

## 2019-03-25 NOTE — Telephone Encounter (Signed)
**Note De-Identified  Obfuscation** The pt is being discharged from the hosp today. We will call him tomorrow.

## 2019-03-26 NOTE — Telephone Encounter (Signed)
**Note De-Identified  Obfuscation** Patients wife Katharine Look contacted regarding the pts discharge from [facility] on 03/25/2019.  Patient understands to follow up with provider Truitt Merle, NP on 04/07/2019 at 10:00 at [facility] in [place], [place] [address]. Patient understands discharge instructions? Yes Patient understands medications and regiment? Yes Patient understands to bring all medications to this visit? Yes

## 2019-04-01 NOTE — Progress Notes (Addendum)
CARDIOLOGY OFFICE NOTE  Date:  04/28/2019    Randy Hanson [facility] Date of Birth: December 14, 1940 Medical Record #[id number]  PCP:  [name], MD  Cardiologist:  Eden Emms (former patient of [facility]'s)    Chief Complaint  Patient presents with  . Follow-up    Seen for Dr. Eden Emms    History of Present Illness: Randy Hanson is a 79 y.o. male who presents today for a post hospital visit. Seen for Dr. Eden Emms. Former patient of Dr. Michaelle Copas (last seen in 2016).   He has a history of throat cancer in remission following right tonsil XRT and chemotherapy from 2017, distant smoking, palpitations and chest pain with multiple normal Myoview studies last  with Dr Katrinka Blazing in 2016. Has chronically abnormal EKG and apparently history of false + EKG on stress testing in the past.   Previously having epigastric and back pain - apparently was to have (or had) stress test - but noted EKG abnormality which was suspected to be AF - Presented to the ER with AF with RVR. No contraindications to anticoagulation. He had previous gastrostomy tube during radiation Rx of his tonsillar cancer. His RUQ [place] suggested acute cholecystitis with stones and distended GB.  Seen by Dr. Eden Emms for surgical clearance - patient was able to complete over 4 mets of activity - echo stable - ok to proceed with plans to start anticoagulation following surgery. He had lap chole - developoed hematuria post op - seen by Dr. Laverle Patter - wil bbe needing outpatient imaging and cystoscopy - ok was given to start anticoagulation in the meantime per Dr. Laverle Patter however Dr. Eden Emms noted that given hematuria and gangrenous gallbladder - would not start anticoagulation but do 30 day event monitor and if has recurrent PAF then consider antiarrhythmic therapy and Eliquis - discharged only on beta blocker.   The patient does not have symptoms concerning for COVID-19 infection (fever, chills, cough, or new shortness of breath).   Comes in today. Here alone. He is  doing ok. Healing up. Moving around. Belly feels ok. He wishes to have all his cardiology care here and not thru the [place] - does not have his monitor. No chest pain. Breathing is ok. No palpitations. Not dizzy. No syncope. Sounds like [facility] - never carried out because of a "block" seen - and still with no follow up to discuss that thru the [place] - thus, wants to stay with Dr. Eden Emms.   Past Medical History:  Diagnosis Date  . Back pain   . BPH (benign prostatic hyperplasia)   . 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 DJD (degenerative joint disease)   . Esophageal reflux   . FHx: colonic polyps   . Hemorrhoids   . History of radiation therapy 08/28/15-- 10/16/15   Right Tonsil and bilateral neck  . Hypercholesteremia   . Hyperlipemia   . Hypertension   . Kidney cysts    [facility] [place] OF KIDNEY STABLE-2012  . Posterior vitreous detachment, left eye    DR. KATHERINE HECKER  AUGUST 2011  . Radiation 08/28/15- 10/16/15   Right Tonsil and Bilateral Neck    Past Surgical History:  Procedure Laterality Date  . CARDIAC CATHETERIZATION     remote >15 years ago  . CHOLECYSTECTOMY N/A 03/23/2019   Procedure: LAPAROSCOPIC CHOLECYSTECTOMY;  Surgeon: Abigail Miyamoto, MD;  Location: [facility] OR;  Service: General;  Laterality: N/A;  . IR GASTROSTOMY TUBE  REMOVAL  02/24/2017  . IR GENERIC HISTORICAL  10/11/2015   IR PATIENT EVAL TECH 0-60 MINS 10/11/2015 Darrell K Allred, PA-C [facility]-INTERV RAD  . IR PATIENT EVAL TECH 0-60 MINS  07/05/2016  . IR PATIENT EVAL TECH 0-60 MINS  08/14/2016  . IR PATIENT EVAL TECH 0-60 MINS  12/09/2016  . IR REMOVAL TUN ACCESS W/ PORT W/O FL MOD SED  07/22/2016  . VASECTOMY  1975     Medications: Current Meds  Medication Sig  . chlorhexidine (PERIDEX) 0.12 % solution Use as directed 15 mLs in the mouth or throat 2 (two) times daily. Mouth rinse  . metoprolol tartrate (LOPRESSOR) 25 MG tablet Take 1 tablet (25 mg total) by mouth 3  (three) times daily.  Marland Kitchen oxyCODONE (OXY IR/ROXICODONE) 5 MG immediate release tablet Take 1 tablet (5 mg total) by mouth every 6 (six) hours as needed for moderate pain.  . polyethylene glycol (MIRALAX / GLYCOLAX) 17 g packet Take 17 g by mouth daily as needed for mild constipation.  . predniSONE (DELTASONE) 10 MG tablet Take 10 mg by mouth daily with breakfast.  . terazosin (HYTRIN) 2 MG capsule Take 2 mg by mouth at bedtime.     Allergies: Allergies  Allergen Reactions  . Lisinopril Cough  . Acyclovir And Related Other (See Comments)    Fever, chills   . Famciclovir Other (See Comments)    Fever, chills    Social History: The patient  reports that he has quit smoking. He has never used smokeless tobacco. He reports that he does not drink alcohol or use drugs.   Family History: The patient's family history includes Hypertension in his mother.   Review of Systems: Please see the history of present illness.   All other systems are reviewed and negative.   Physical Exam: VS:  BP 120/68   Pulse 60   Ht 6' (1.829 m)   Wt 203 lb (92.1 kg)   SpO2 98%   BMI 27.53 kg/m  .  BMI Body mass index is 27.53 kg/m.  Wt Readings from Last 3 Encounters:  04/26/19 203 lb (92.1 kg)  04/07/19 203 lb (92.1 kg)  03/23/19 210 lb (95.3 kg)    General: Pleasant. Alert and in no acute distress.  HEENT: Normal.  Neck: Supple, no JVD, carotid bruits, or masses noted.  Cardiac: Regular rate and rhythm. No murmurs, rubs, or gallops. No edema.  Respiratory:  Lungs are clear to auscultation bilaterally with normal work of breathing.  GI: Soft and nontender.  MS: No deformity or atrophy. Gait and ROM intact.  Skin: Warm and dry. Color is normal.  Neuro:  Strength and sensation are intact and no gross focal deficits noted.  Psych: Alert, appropriate and with normal affect.   LABORATORY DATA:  EKG:  EKG is ordered today. This demonstrates NSR today - HR is 60.  Lab Results  Component Value  Date   WBC 7.2 04/26/2019   HGB 15.1 04/26/2019   HCT 44.2 04/26/2019   PLT 176 04/26/2019   GLUCOSE 117 (H) 04/26/2019   ALT 14 04/26/2019   AST 15 04/26/2019   NA 143 04/26/2019   K 4.3 04/26/2019   CL 103 04/26/2019   CREATININE 1.19 04/26/2019   BUN 14 04/26/2019   CO2 32 04/26/2019   TSH 1.878 04/26/2019   INR 1.0 03/22/2019   HGBA1C 5.8 (H) 03/22/2019     BNP (last 3 results) No results for input(s): BNP in the last 8760 hours.  ProBNP (last 3 results) No results for input(s): PROBNP in the last 8760 hours.   Other Studies Reviewed Today:  ECHO IMPRESSIONS 03/2019   1. Left ventricular ejection fraction, by visual estimation, is 65 to 70%. The left ventricle has hyperdynamic function. There is no left ventricular hypertrophy.  2. Left ventricular diastolic parameters are consistent with Grade I diastolic dysfunction (impaired relaxation).  3. The left ventricle has no regional wall motion abnormalities.  4. Global right ventricle has normal systolic function.The right ventricular size is normal. No increase in right ventricular wall thickness.  5. Left atrial size was normal.  6. Right atrial size was normal.  7. Mild to moderate mitral annular calcification.  8. The mitral valve is normal in structure. No evidence of mitral valve regurgitation. No evidence of mitral stenosis.  9. The tricuspid valve is normal in structure. 10. The aortic valve is normal in structure. Aortic valve regurgitation is not visualized. Mild aortic valve sclerosis without stenosis. 11. The pulmonic valve was normal in structure. Pulmonic valve regurgitation is trivial. 12. The inferior vena cava is normal in size with greater than 50% respiratory variability, suggesting right atrial pressure of 3 mmHg.   Assessment/Plan:  1. PAF - CHADSVASC of 3 - age/HTN - he is back in NSR - plan was for 30 day monitor to assess for further AF - holding on anticoagulation for now. Will try to get his  EKG from the [place] at the time of the stress test to make sure no other abnormality noted. For now, no change with current regimen.   2. S/P cholecystectomy - recovering well.   3. Diastolic dysfunction - has good BP control.   4. HTN - BP looks good - no changes made today.   5. Prior throat cancer - in remission.  6. Post op hematuria - to have further GU evaluation - this is pending.   7. COVID-19 Education: The signs and symptoms of COVID-19 were discussed with the patient and how to seek care for testing (follow up with PCP or arrange E-visit).  The importance of social distancing, staying at home, hand hygiene and wearing a mask when out in public were discussed today.  Current medicines are reviewed with the patient today.  The patient does not have concerns regarding medicines other than what has been noted above.  The following changes have been made:  See above.  Labs/ tests ordered today include:    Orders Placed This Encounter  Procedures  . CARDIAC EVENT MONITOR  . EKG 12-Lead     Disposition:   FU with [place] after the monitor is complete.    Patient is agreeable to this plan and will call if any problems develop in the interim.   SignedNorma Fredrickson, NP  04/28/2019 8:13 AM  [facility] [address] [place], [place]  [address] Phone: [phone] Fax: [phone]      Addendum: 04/28/19  Update - he has had noted AF with RVR on his monitor. Due to ongoing issues with GU/hematuria - he continues to have that work up. He has had a CT which we do not have results (he has to sign release for CT to be sent to [place]).  I have called urology today and was only able to leave a message for Dr. Laverle Patter to see if we can facilitate getting this information in order to make decision about starting [facility] therapy. Our phone and fax numbers were  left on that message.   Also, we have obtained the Myoview report from the [place] - this study  (stress component) was aborted due to abnormal EKG findings - there was concern for 2nd degree AV block - type 1 Ebbie Latus) - although I think this is questionable. Only one EKG sent. Certainly had 1st degree AV block noted. Also noted was "diminished uptake in the inferior wall from the apex through and the base with observed motion on the rotating data that could be causing diaphragmatic attenuation".    Rosalio Macadamia, RN, ANP-C [facility] [address] [place], [place]  [address] [phone]

## 2019-04-07 ENCOUNTER — Encounter: Payer: Self-pay | Admitting: Nurse Practitioner

## 2019-04-07 ENCOUNTER — Other Ambulatory Visit: Payer: Self-pay

## 2019-04-07 ENCOUNTER — Ambulatory Visit (INDEPENDENT_AMBULATORY_CARE_PROVIDER_SITE_OTHER): Payer: Medicare Other | Admitting: Nurse Practitioner

## 2019-04-07 ENCOUNTER — Encounter: Payer: Self-pay | Admitting: *Deleted

## 2019-04-07 VITALS — BP 120/68 | HR 60 | Ht 72.0 in | Wt 203.0 lb

## 2019-04-07 DIAGNOSIS — I480 Paroxysmal atrial fibrillation: Secondary | ICD-10-CM | POA: Diagnosis not present

## 2019-04-07 DIAGNOSIS — R319 Hematuria, unspecified: Secondary | ICD-10-CM

## 2019-04-07 DIAGNOSIS — Z9049 Acquired absence of other specified parts of digestive tract: Secondary | ICD-10-CM

## 2019-04-07 DIAGNOSIS — Z7189 Other specified counseling: Secondary | ICD-10-CM

## 2019-04-07 DIAGNOSIS — I10 Essential (primary) hypertension: Secondary | ICD-10-CM

## 2019-04-07 NOTE — Patient Instructions (Addendum)
After Visit Summary:  We will be checking the following labs today - NONE   Medication Instructions:    Continue with your current medicines.    If you need a refill on your cardiac medications before your next appointment, please call your pharmacy.     Testing/Procedures To Be Arranged:  N/A  Follow-Up:   See Dr.Nishan as planned    At [facility], you and your health needs are our priority.  As part of our continuing mission to provide you with exceptional heart care, we have created designated Provider Care Teams.  These Care Teams include your primary Cardiologist (physician) and Advanced Practice Providers (APPs -  Physician Assistants and Nurse Practitioners) who all work together to provide you with the care you need, when you need it.  Special Instructions:  . Stay safe, stay home, wash your hands for at least 20 seconds and wear a mask when out in public.  . It was good to talk with you today.  . Release for the stress test/EKG readings done at [facility] has requested you wear your cardiac event monitor for _____ days, (1-30). [employer] may call or text to confirm a shipping address. The monitor will be sent to a land address via UPS. [employer] will not ship a monitor to a PO BOX. It typically takes 3-5 days to receive your monitor after it has been enrolled. [employer] will assist with USPS tracking if your package is delayed. The telephone number for [employer] is [phone]. Once you have received your monitor, please review the enclosed instructions. Instruction tutorials can also be viewed under help and settings on the enclosed cell phone. Your monitor has already been registered assigning a specific monitor serial # to you.  Applying the monitor Remove cell phone from case and turn it on. The cell phone works as Dealer and needs to be within [employer] of you at  all times. The cell phone will need to be charged on a daily basis. We recommend you plug the cell phone into the enclosed charger at your bedside table every night.  Monitor batteries: You will receive two monitor batteries labelled #1 and #2. These are your recorders. Plug battery #2 onto the second connection on the enclosed charger. Keep one battery on the charger at all times. This will keep the monitor battery deactivated. It will also keep it fully charged for when you need to switch your monitor batteries. A small light will be blinking on the battery emblem when it is charging. The light on the battery emblem will remain on when the battery is fully charged.  Open package of a Monitor strip. Insert battery #1 into black hood on strip and gently squeeze monitor battery onto connection as indicated in instruction booklet. Set aside while preparing skin.  Choose location for your strip, vertical or horizontal, as indicated in the instruction booklet. Shave to remove all hair from location. There cannot be any lotions, oils, powders, or colognes on skin where monitor is to be applied. Wipe skin clean with enclosed Saline wipe. Dry skin completely.  Peel paper labeled #1 off the back of the Monitor strip exposing the adhesive. Place the monitor on the chest in the vertical or horizontal position shown in the instruction booklet. One arrow on the monitor strip must be pointing upward. Carefully remove paper labeled #2, attaching remainder of strip to your skin. Try not to  create any folds or wrinkles in the strip as you apply it.  Firmly press and release the circle in the center of the monitor battery. You will hear a small beep. This is turning the monitor battery on. The heart emblem on the monitor battery will light up every 5 seconds if the monitor battery in turned on and connected to the patient securely. Do not push and hold the circle down as this turns the monitor battery off.  The cell phone will locate the monitor battery. A screen will appear on the cell phone checking the connection of your monitor strip. This may read poor connection initially but change to good connection within the next minute. Once your monitor accepts the connection you will hear a series of 3 beeps followed by a climbing crescendo of beeps. A screen will appear on the cell phone showing the two monitor strip placement options. Touch the picture that demonstrates where you applied the monitor strip.  Your monitor strip and battery are waterproof. You are able to shower, bathe, or swim with the monitor on. They just ask you do not submerge deeper than 3 feet underwater. We recommend removing the monitor if you are swimming in a lake, river, or ocean.  Your monitor battery will need to be switched to a fully charged monitor battery approximately once a week. The cell phone will alert you of an action which needs to be made.  On the cell phone, tap for details to reveal connection status, monitor battery status, and cell phone battery status. The green dots indicates your monitor is in good status. A red dot indicates there is something that needs your attention.  To record a symptom, click the circle on the monitor battery. In 30-60 seconds a list of symptoms will appear on the cell phone. Select your symptom and tap save. Your monitor will record a sustained or significant arrhythmia regardless of you clicking the button. Some patients do not feel the heart rhythm irregularities. [employer] will notify us of any serious or critical events.  Refer to instruction booklet for instructions on switching batteries, changing strips, the Do not disturb or Pause features, or any additional questions.  Call [employer] at [phone], to confirm your monitor is transmitting and record your baseline. They will answer any questions you may have regarding the monitor instructions at that  time.  Returning the monitor to [facility] all equipment back into blue box. Peel off strip of paper to expose adhesive and close box securely. There is a prepaid UPS shipping label on this box. Drop in a UPS drop box, or at a UPS facility like [employer]. You may also contact [employer] to arrange UPS to pick up monitor package at your home.   Call the [place] office at [phone] if you have any questions, problems or concerns.

## 2019-04-07 NOTE — Progress Notes (Signed)
Patient ID: Randy Hanson, male   DOB: 17-Dec-1940, 79 y.o.   MRN: [id number] Patient enrolled for [employer] to ship a 30 day cardiac event monitor to his home.

## 2019-04-08 ENCOUNTER — Telehealth: Payer: Self-pay | Admitting: Cardiovascular Disease

## 2019-04-08 NOTE — Telephone Encounter (Signed)
[facility] Dept faxed request for medical records to [facility] 04/08/19  KLM

## 2019-04-14 ENCOUNTER — Ambulatory Visit (INDEPENDENT_AMBULATORY_CARE_PROVIDER_SITE_OTHER): Payer: Medicare Other

## 2019-04-14 DIAGNOSIS — I480 Paroxysmal atrial fibrillation: Secondary | ICD-10-CM | POA: Diagnosis not present

## 2019-04-16 DIAGNOSIS — N400 Enlarged prostate without lower urinary tract symptoms: Secondary | ICD-10-CM | POA: Diagnosis not present

## 2019-04-16 DIAGNOSIS — R310 Gross hematuria: Secondary | ICD-10-CM | POA: Diagnosis not present

## 2019-04-20 ENCOUNTER — Telehealth: Payer: Self-pay | Admitting: Physician Assistant

## 2019-04-20 NOTE — Telephone Encounter (Signed)
[employer] called about an episode of Afib, RVR, HR up to 150 bpm. SR before and after.  Rosaria Ferries, PA-C 04/20/2019 5:55 PM

## 2019-04-21 NOTE — Telephone Encounter (Signed)
Good morning Dr. Johnsie Cancel,  Just wanted to get your input. You saw Randy Hanson in the hospital (gangrenous gallbladder) and PAF. Anticoagulation not started due to gangrene and hematuria.    He is having urology work up for the hematuria - for CT tomorrow. Has his monitor on and this did show episode of PAF with RVR yesterday - patient was at work.  Your thoughts on starting anticoagulation? I was going to see what the CT shows and then probably start if that looked ok.   He has follow up with you next month.   Thanks, Cecille Rubin

## 2019-04-21 NOTE — Telephone Encounter (Signed)
His monitor showed a spell of atrial fib.   Has he had his GU follow up for the blood in his urine?  Will need to discuss with Dr. Johnsie Cancel and consider starting anticoagulation.  Cecille Rubin

## 2019-04-21 NOTE — Telephone Encounter (Signed)
S/w pt had a camera last Friday to check out bladder.  Pt is set for a CT scan of kidney February 11.  Pt is to continue wearing monitor and Cecille Rubin will see what the CT scan shows.

## 2019-04-21 NOTE — Telephone Encounter (Signed)
Would not start until CT r/o pathology like renal cell or stone and hematuria stopped

## 2019-04-22 DIAGNOSIS — N200 Calculus of kidney: Secondary | ICD-10-CM | POA: Diagnosis not present

## 2019-04-22 DIAGNOSIS — R310 Gross hematuria: Secondary | ICD-10-CM | POA: Diagnosis not present

## 2019-04-26 ENCOUNTER — Telehealth: Payer: Self-pay

## 2019-04-26 ENCOUNTER — Inpatient Hospital Stay: Payer: Medicare Other | Attending: Hematology & Oncology | Admitting: Hematology & Oncology

## 2019-04-26 ENCOUNTER — Encounter: Payer: Self-pay | Admitting: Hematology & Oncology

## 2019-04-26 ENCOUNTER — Telehealth: Payer: Self-pay | Admitting: Hematology & Oncology

## 2019-04-26 ENCOUNTER — Inpatient Hospital Stay: Payer: Medicare Other

## 2019-04-26 ENCOUNTER — Other Ambulatory Visit: Payer: Self-pay

## 2019-04-26 VITALS — BP 138/83 | HR 93 | Temp 97.3°F | Resp 19 | Wt 203.0 lb

## 2019-04-26 DIAGNOSIS — Z9221 Personal history of antineoplastic chemotherapy: Secondary | ICD-10-CM | POA: Diagnosis not present

## 2019-04-26 DIAGNOSIS — Z923 Personal history of irradiation: Secondary | ICD-10-CM | POA: Insufficient documentation

## 2019-04-26 DIAGNOSIS — I4891 Unspecified atrial fibrillation: Secondary | ICD-10-CM | POA: Diagnosis not present

## 2019-04-26 DIAGNOSIS — C099 Malignant neoplasm of tonsil, unspecified: Secondary | ICD-10-CM

## 2019-04-26 DIAGNOSIS — K82A1 Gangrene of gallbladder in cholecystitis: Secondary | ICD-10-CM | POA: Insufficient documentation

## 2019-04-26 LAB — CMP (CANCER CENTER ONLY)
ALT: 14 U/L (ref 0–44)
AST: 15 U/L (ref 15–41)
Albumin: 4.3 g/dL (ref 3.5–5.0)
Alkaline Phosphatase: 54 U/L (ref 38–126)
Anion gap: 8 (ref 5–15)
BUN: 14 mg/dL (ref 8–23)
CO2: 32 mmol/L (ref 22–32)
Calcium: 10.5 mg/dL — ABNORMAL HIGH (ref 8.9–10.3)
Chloride: 103 mmol/L (ref 98–111)
Creatinine: 1.19 mg/dL (ref 0.61–1.24)
GFR, Est AFR Am: >60 mL/min (ref >60)
GFR, Estimated: 59 mL/min — ABNORMAL LOW (ref >60)
Glucose, Bld: 117 mg/dL — ABNORMAL HIGH (ref 70–99)
Potassium: 4.3 mmol/L (ref 3.5–5.1)
Sodium: 143 mmol/L (ref 135–145)
Total Bilirubin: 0.6 mg/dL (ref 0.3–1.2)
Total Protein: 6.3 g/dL — ABNORMAL LOW (ref 6.5–8.1)

## 2019-04-26 LAB — CBC WITH DIFFERENTIAL (CANCER CENTER ONLY)
Abs Immature Granulocytes: 0.07 10*3/uL (ref 0.00–0.07)
Basophils Absolute: 0 10*3/uL (ref 0.0–0.1)
Basophils Relative: 0 %
Eosinophils Absolute: 0.1 10*3/uL (ref 0.0–0.5)
Eosinophils Relative: 2 %
HCT: 44.2 % (ref 39.0–52.0)
Hemoglobin: 15.1 g/dL (ref 13.0–17.0)
Immature Granulocytes: 1 %
Lymphocytes Relative: 13 %
Lymphs Abs: 0.9 10*3/uL (ref 0.7–4.0)
MCH: 32.3 pg (ref 26.0–34.0)
MCHC: 34.2 g/dL (ref 30.0–36.0)
MCV: 94.4 fL (ref 80.0–100.0)
Monocytes Absolute: 0.6 10*3/uL (ref 0.1–1.0)
Monocytes Relative: 8 %
Neutro Abs: 5.5 10*3/uL (ref 1.7–7.7)
Neutrophils Relative %: 76 %
Platelet Count: 176 10*3/uL (ref 150–400)
RBC: 4.68 MIL/uL (ref 4.22–5.81)
RDW: 13 % (ref 11.5–15.5)
WBC Count: 7.2 10*3/uL (ref 4.0–10.5)
nRBC: 0 % (ref 0.0–0.2)

## 2019-04-26 LAB — TSH: TSH: 1.878 u[IU]/mL (ref 0.320–4.118)

## 2019-04-26 NOTE — Telephone Encounter (Signed)
S/w Holly at [facility] in med rec @ [phone] to get copy of CT scan pt had last week.  Earnest Bailey stated pt needs to sign a release form due to HIPPA or pt can call to office give a verbal order and can have CT scan ready for pt to pick up.  LVM for pt to call back.

## 2019-04-26 NOTE — Progress Notes (Signed)
Hematology and Oncology Follow Up Visit  JAFET MCFEATERS [id number] 07/03/1940 79 y.o. 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 Arnette is here today for a follow-up.  He has been quite busy since we last saw him.  Unfortunately this is not a good thing for him.  He underwent a emergent cholecystectomy on January 12.  He was found to have a gangrenous gallbladder.  I am sure that was full of stones.  He is in the hospital for about 4 days.  Thankfully, he recovered nicely.  He also was found to have atrial fibrillation.  He has a cardiac monitor on.  He has had on for 2 weeks already.  He needs to wear for 1 month.  Otherwise, everything is about the same.  He is eating fairly well.  He is not having a lot of problems swallowing.  There is been no issues with fever.  He has had no issues with nausea or vomiting outside of that associate with the cholecystectomy.  He also had a bout of hematuria.  I think he sees a urologist for this.  He said he had a CT scan of the kidneys which show some small stones.  Thankfully, he has gotten his COVID vaccine.  He got this at the [facility]  Overall, his performance status is ECOG 1.      Medications:  Allergies as of 04/26/2019      Reactions   Lisinopril Cough   Acyclovir And Related Other (See Comments)   Fever, chills   Famciclovir Other (See Comments)   Fever, chills      Medication List       Accurate as of April 26, 2019 10:51 AM. If you have any questions, ask your nurse or doctor.        chlorhexidine 0.12 % solution Commonly known as: PERIDEX Use as directed 15 mLs in the mouth or throat 2 (two) times daily. Mouth rinse   FOLIC ACID PO Take by mouth daily.   methotrexate 1 g injection Commonly known as: 50 mg/ml Inject into  the vein once a week.   metoprolol tartrate 25 MG tablet Commonly known as: LOPRESSOR Take 1 tablet (25 mg total) by mouth 3 (three) times daily.   oxyCODONE 5 MG immediate release tablet Commonly known as: Oxy IR/ROXICODONE Take 1 tablet (5 mg total) by mouth every 6 (six) hours as needed for moderate pain.   polyethylene glycol 17 g packet Commonly known as: MIRALAX / GLYCOLAX Take 17 g by mouth daily as needed for mild constipation.   predniSONE 10 MG tablet Commonly known as: DELTASONE Take 10 mg by mouth daily with breakfast.   terazosin 2 MG capsule Commonly known as: HYTRIN Take 2 mg by mouth at bedtime.       Allergies:  Allergies  Allergen Reactions  . Lisinopril Cough  . Acyclovir And Related Other (See Comments)    Fever, chills   . Famciclovir Other (See Comments)    Fever, chills    Past Medical History, Surgical history, Social history, and Family History were reviewed and updated.  Review of Systems: Review of Systems  Constitutional: Negative.   HENT: Negative.   Eyes: Negative.   Respiratory: Negative.   Cardiovascular: Negative.   Gastrointestinal: Negative.  Genitourinary: Negative.   Musculoskeletal: Negative.   Skin: Negative.   Neurological: Negative.   Endo/Heme/Allergies: Negative.   Psychiatric/Behavioral: Negative.      Physical Exam:  weight is 203 lb (92.1 kg). His temporal temperature is 97.3 F (36.3 C) (abnormal). His blood pressure is 138/83 and his pulse is 93. His respiration is 19 and oxygen saturation is 99%.   Wt Readings from Last 3 Encounters:  04/26/19 203 lb (92.1 kg)  04/07/19 203 lb (92.1 kg)  03/23/19 210 lb (95.3 kg)    Physical Exam Vitals reviewed.  HENT:     Head: Normocephalic and atraumatic.  Eyes:     Pupils: Pupils are equal, round, and reactive to light.  Cardiovascular:     Rate and Rhythm: Normal rate and regular rhythm.     Heart sounds: Normal heart sounds.  Pulmonary:     Effort:  Pulmonary effort is normal.     Breath sounds: Normal breath sounds.  Abdominal:     General: Bowel sounds are normal.     Palpations: Abdomen is soft.     Comments: Abdominal exam shows a soft abdomen.  He has well-healed laparoscopic scars.  He has decent bowel sounds.  There is no guarding or rebound tenderness.  Musculoskeletal:        General: No tenderness or deformity. Normal range of motion.     Cervical back: Normal range of motion.  Lymphadenopathy:     Cervical: No cervical adenopathy.  Skin:    General: Skin is warm and dry.     Findings: No erythema or rash.  Neurological:     Mental Status: He is alert and oriented to person, place, and time.  Psychiatric:        Behavior: Behavior normal.        Thought Content: Thought content normal.        Judgment: Judgment normal.      Lab Results  Component Value Date   WBC 7.2 04/26/2019   HGB 15.1 04/26/2019   HCT 44.2 04/26/2019   MCV 94.4 04/26/2019   PLT 176 04/26/2019   No results found for: FERRITIN, IRON, TIBC, UIBC, IRONPCTSAT Lab Results  Component Value Date   RBC 4.68 04/26/2019   No results found for: KPAFRELGTCHN, LAMBDASER, KAPLAMBRATIO No results found for: IGGSERUM, IGA, IGMSERUM No results found for: Marda Stalker, SPEI   Chemistry      Component Value Date/Time   NA 143 04/26/2019 0919   NA 146 (H) 02/20/2017 1007   NA 140 07/04/2016 1014   K 4.3 04/26/2019 0919   K 4.7 02/20/2017 1007   K 4.7 07/04/2016 1014   CL 103 04/26/2019 0919   CL 104 02/20/2017 1007   CO2 32 04/26/2019 0919   CO2 31 02/20/2017 1007   CO2 27 07/04/2016 1014   BUN 14 04/26/2019 0919   BUN 14 02/20/2017 1007   BUN 22.1 07/04/2016 1014   CREATININE 1.19 04/26/2019 0919   CREATININE 1.1 02/20/2017 1007   CREATININE 1.0 07/04/2016 1014      Component Value Date/Time   CALCIUM 10.5 (H) 04/26/2019 0919   CALCIUM 9.8 02/20/2017 1007   CALCIUM 10.6 (H) 07/04/2016  1014   ALKPHOS 54 04/26/2019 0919   ALKPHOS 72 02/20/2017 1007   ALKPHOS 255 (H) 07/04/2016 1014   AST 15 04/26/2019 0919   AST 28 07/04/2016 1014   ALT 14 04/26/2019 0919   ALT 17 02/20/2017 1007  ALT 55 07/04/2016 1014   BILITOT 0.6 04/26/2019 0919   BILITOT 0.80 07/04/2016 1014     Impression and Plan: Mr. Stewart is a very pleasant 78 sure I have toyo white male with stage I  HPV+ - squamous cell carcinoma the right tonsil. He completed radiation and chemotherapy in early August, 2017.   I see no evidence of recurrence of his malignancy.  Unfortunately, he had the emergent cholecystectomy.  Again he is recovered quite nicely.  I am grateful that his quality of life is doing quite well right now.  I will plan to see him back in another 6 months.  We do have to watch with his thyroid.    [name], MD 2/15/202110:51 AM

## 2019-04-26 NOTE — Telephone Encounter (Signed)
Appointments scheduled calendar printed per 2/15 los 

## 2019-04-26 NOTE — Telephone Encounter (Signed)
Pt called back returning a call from [place]. The best number to reach him will be his land line. He will be home all day

## 2019-04-26 NOTE — Telephone Encounter (Signed)
Received alert from [facility] monitoring for event recorded on 04/24/19 at 140pm.  Alert shows Afib with RVR,sustained.  Attempted to contact pt and left voicemail message to contact RN at [phone].  Alert reviewed and signed by Dr Blima Rich.  No additional recommendation at this time.

## 2019-04-26 NOTE — Telephone Encounter (Signed)
Pt called back returning a call from [place]. He states the best number to reach him is his landline, and he will be hole the remainder of the day

## 2019-04-26 NOTE — Telephone Encounter (Signed)
Phone call to pt and advised of [employer] monitor alert received from 04/24/2019 at 1:27pm.  Pt states he does not recall any symptoms and feels fine.  Pt advised to continue prescribed medications and follow up as directed.  Pt verbalizes understanding and agrees with current plan.

## 2019-04-26 NOTE — Telephone Encounter (Signed)
Can we get a copy of his CT from urology from last week?  Cecille Rubin

## 2019-04-26 NOTE — Telephone Encounter (Signed)
S/w pt stated he will call [facility] and get CT scan sent to this office.

## 2019-04-27 NOTE — Telephone Encounter (Addendum)
S/w pt to get update on release form,  pt had to sign med rec release form at [facility] to get CT results to [place].  Pt stated Urology is mailing a release from to pt's house.  Stated can pt go into Urology  office and sign form instead of getting this through the mail. Pt stated has to work but if pt gets a chance pt will stop by Urology office. Cecille Rubin is aware.

## 2019-04-30 ENCOUNTER — Telehealth: Payer: Self-pay | Admitting: Cardiovascular Disease

## 2019-04-30 DIAGNOSIS — I472 Ventricular tachycardia, unspecified: Secondary | ICD-10-CM

## 2019-04-30 DIAGNOSIS — I4891 Unspecified atrial fibrillation: Secondary | ICD-10-CM

## 2019-04-30 NOTE — Telephone Encounter (Signed)
Please arrange EP follow up per Dr. Johnsie Cancel due to recent findings on his monitor.   Thanks, Cecille Rubin

## 2019-04-30 NOTE — Telephone Encounter (Signed)
Please have him see EP within the next 2 weeks

## 2019-04-30 NOTE — Telephone Encounter (Signed)
Notified by [employer] that patient had a 11 second run of VT at 157 bpm, which converted to 2nd degree type I AV block upon conversion. Patient asymptomatic at the time.   Will notify Dr. Johnsie Cancel.   Zaydenn Balaguer K. Marletta Lor, MD

## 2019-04-30 NOTE — Telephone Encounter (Signed)
I spoke with patient and let him know Dr Johnsie Cancel would like him to see one of our EP doctors.  I told patient he would be contacted with appointment information.

## 2019-04-30 NOTE — Addendum Note (Signed)
Addended by: Thompson Grayer on: 04/30/2019 09:36 AM   Modules accepted: Orders

## 2019-05-05 ENCOUNTER — Ambulatory Visit: Payer: Medicare Other | Admitting: Cardiovascular Disease

## 2019-05-07 ENCOUNTER — Other Ambulatory Visit: Payer: Self-pay

## 2019-05-07 DIAGNOSIS — I4891 Unspecified atrial fibrillation: Secondary | ICD-10-CM | POA: Diagnosis not present

## 2019-05-07 DIAGNOSIS — Z9221 Personal history of antineoplastic chemotherapy: Secondary | ICD-10-CM | POA: Diagnosis not present

## 2019-05-07 DIAGNOSIS — Z923 Personal history of irradiation: Secondary | ICD-10-CM | POA: Diagnosis not present

## 2019-05-07 DIAGNOSIS — I10 Essential (primary) hypertension: Secondary | ICD-10-CM | POA: Diagnosis not present

## 2019-05-07 DIAGNOSIS — C099 Malignant neoplasm of tonsil, unspecified: Secondary | ICD-10-CM | POA: Diagnosis not present

## 2019-05-07 DIAGNOSIS — K82A1 Gangrene of gallbladder in cholecystitis: Secondary | ICD-10-CM | POA: Diagnosis not present

## 2019-05-10 ENCOUNTER — Telehealth: Payer: Self-pay | Admitting: *Deleted

## 2019-05-10 NOTE — Telephone Encounter (Signed)
No changes continue to monitor and  keep upcoming appt with Dr Lovena Le ./cy

## 2019-05-10 NOTE — Telephone Encounter (Signed)
Spoke with Randy Hanson no symptoms noted during episode will continue to monitor and will show Dr Johnsie Cancel as well for review ./cy

## 2019-05-10 NOTE — Telephone Encounter (Signed)
Lm for pt to call back re serious recording from [employer]  On 05/08/19 at 9:17 pm CST  Sinus arrhythmia w/Run of V-Tach (  6 beats) /couplet PVC's/PVCs(5 In 1 min ) .Adonis Housekeeper

## 2019-05-18 ENCOUNTER — Ambulatory Visit (INDEPENDENT_AMBULATORY_CARE_PROVIDER_SITE_OTHER): Payer: Medicare Other | Admitting: Internal Medicine

## 2019-05-18 ENCOUNTER — Encounter: Payer: Self-pay | Admitting: Internal Medicine

## 2019-05-18 ENCOUNTER — Other Ambulatory Visit: Payer: Self-pay

## 2019-05-18 DIAGNOSIS — R002 Palpitations: Secondary | ICD-10-CM

## 2019-05-18 NOTE — Patient Instructions (Addendum)
Medication Instructions:  Your physician recommends that you continue on your current medications as directed. Please refer to the Current Medication list given to you today.  Labwork: None ordered.  Testing/Procedures: None ordered.  Follow-Up: Your physician wants you to follow-up in: as needed with Dr. Taylor.      Any Other Special Instructions Will Be Listed Below (If Applicable).  If you need a refill on your cardiac medications before your next appointment, please call your pharmacy.   

## 2019-05-18 NOTE — Progress Notes (Signed)
HPI Randy Hanson is referred today by Dr. Johnsie Cancel for evaluation of NSVT and possible atrial fib. He is a pleasant 79 yo man with HTN who was hospitalized with cholecystitis and workup was notable for PAF. He did not initially get started on anti-coagulation. He has worn a cardia monitor which showed NSVT. He has worn a monitor for 3 weeks and still has another weeks to go.  Allergies  Allergen Reactions  . Lisinopril Cough  . Acyclovir And Related Other (See Comments)    Fever, chills   . Famciclovir Other (See Comments)    Fever, chills     Current Outpatient Medications  Medication Sig Dispense Refill  . chlorhexidine (PERIDEX) 0.12 % solution Use as directed 15 mLs in the mouth or throat 2 (two) times daily. Mouth rinse    . FOLIC ACID PO Take by mouth daily.    . methotrexate (50 MG/ML) 1 g injection Inject into the vein once a week.    . metoprolol tartrate (LOPRESSOR) 25 MG tablet Take 1 tablet (25 mg total) by mouth 3 (three) times daily. 60 tablet 1  . oxyCODONE (OXY IR/ROXICODONE) 5 MG immediate release tablet Take 1 tablet (5 mg total) by mouth every 6 (six) hours as needed for moderate pain. 20 tablet 0  . polyethylene glycol (MIRALAX / GLYCOLAX) 17 g packet Take 17 g by mouth daily as needed for mild constipation. 14 each 0  . predniSONE (DELTASONE) 10 MG tablet Take 10 mg by mouth daily with breakfast.    . terazosin (HYTRIN) 2 MG capsule Take 2 mg by mouth at bedtime.     No current facility-administered medications for this visit.     Past Medical History:  Diagnosis Date  . Back pain   . BPH (benign prostatic hyperplasia)   . Cancer of tonsil, palatine ([place]) 07/12/2015  . DJD (degenerative joint disease)   . Esophageal reflux   . FHx: colonic polyps   . Hemorrhoids   . History of radiation therapy 08/28/15-- 10/16/15   Right Tonsil and bilateral neck  . Hypercholesteremia   . Hyperlipemia   . Hypertension   . Kidney cysts    [facility] [place] [facility]  [facility]-2012  . Posterior vitreous detachment, left eye    DR. KATHERINE HECKER  AUGUST 2011  . Radiation 08/28/15- 10/16/15   Right Tonsil and Bilateral Neck    ROS:   All systems reviewed and negative except as noted in the HPI.   Past Surgical History:  Procedure Laterality Date  . CARDIAC CATHETERIZATION     remote >15 years ago  . CHOLECYSTECTOMY N/A 03/23/2019   Procedure: LAPAROSCOPIC CHOLECYSTECTOMY;  Surgeon: Coralie Keens, MD;  Location: [facility];  Service: General;  Laterality: N/A;  . IR GASTROSTOMY TUBE REMOVAL  02/24/2017  . IR GENERIC HISTORICAL  10/11/2015   IR PATIENT EVAL TECH 0-60 MINS 10/11/2015 Darrell K Allred, PA-C [facility]  . IR PATIENT EVAL TECH 0-60 MINS  07/05/2016  . IR PATIENT EVAL TECH 0-60 MINS  08/14/2016  . IR PATIENT EVAL TECH 0-60 MINS  12/09/2016  . IR REMOVAL TUN ACCESS W/ PORT W/O FL MOD SED  07/22/2016  . VASECTOMY  1975     Family History  Problem Relation Age of Onset  . Hypertension Mother      Social History   Socioeconomic History  . Marital status: Married    Spouse name: Not on file  . Number of children:  Not on file  . Years of education: Not on file  . Highest education level: Not on file  Occupational History  . Not on file  Tobacco Use  . Smoking status: Former Research scientist (life sciences)  . Smokeless tobacco: Never Used  . Tobacco comment: quit 35 years ago  Substance and Sexual Activity  . Alcohol use: No    Alcohol/week: 0.0 standard drinks  . Drug use: No  . Sexual activity: Not on file  Other Topics Concern  . Not on file  Social History Narrative   TOBACCO USE CIGARETTES: NEVER SMOKED.NO SMOKING.NO ALCOHOL .CAFFEINE YES:NO  RECREATIONAL DRUGS. OCCUPATION :RETIRED   MARTIAL STATUS : MARRIED    Social Determinants of Health   Financial Resource Strain:   . Difficulty of Paying Living Expenses: Not on file  Food Insecurity:   . Worried About Charity fundraiser in the Last Year: Not on file  . Ran Out of Food in the Last Year:  Not on file  Transportation Needs: No Transportation Needs  . Lack of Transportation (Medical): No  . Lack of Transportation (Non-Medical): No  Physical Activity:   . Days of Exercise per Week: Not on file  . Minutes of Exercise per Session: Not on file  Stress:   . Feeling of Stress : Not on file  Social Connections:   . Frequency of Communication with Friends and Family: Not on file  . Frequency of Social Gatherings with Friends and Family: Not on file  . Attends Religious Services: Not on file  . Active Member of Clubs or Organizations: Not on file  . Attends Archivist Meetings: Not on file  . Marital Status: Not on file  Intimate Partner Violence:   . Fear of Current or Ex-Partner: Not on file  . Emotionally Abused: Not on file  . Physically Abused: Not on file  . Sexually Abused: Not on file     BP 134/82   Pulse 81   Ht 6' (1.829 m)   Wt 204 lb (92.5 kg)   SpO2 98%   BMI 27.67 kg/m   Physical Exam:  Well appearing NAD HEENT: Unremarkable Neck:  No JVD, no thyromegally Lymphatics:  No adenopathy Back:  No CVA tenderness Lungs:  Clear with no wheezes HEART:  Regular rate rhythm, no murmurs, no rubs, no clicks Abd:  soft, positive bowel sounds, no organomegally, no rebound, no guarding Ext:  2 plus pulses, no edema, no cyanosis, no clubbing Skin:  No rashes no nodules Neuro:  CN II through XII intact, motor grossly intact  EKG - none today. I reviewed all of his ECG including from 1/21. There are two that have been read as atrial fib which are actually MAT and not atrial fib.  His 30 day monitor has shown NSVT  Assess/Plan: 1. PAF - I see no evidence of atrial fib. We will await the results of the 30 day monitor. At this point I would not recommend systemic anti-coagulation. He will continue metoprolol. 2. HTN - his bp is fairly well controlled. He will continue his current meds. 3. NSVT - I have seen a single episode off of the cardiac monitor he  has worn which is asymptomatic. In the setting of normal LV function and no/minimal symptoms, he will continue the beta blocker but no other treatment is indicated.  Mikle Bosworth.D.

## 2019-05-20 NOTE — Progress Notes (Deleted)
CARDIOLOGY CONSULT NOTE       Patient ID: Randy Hanson MRN: [id number] DOB/AGE: [birth year] 79 y.o.   Primary Physician: [name], MD Primary Cardiologist: Terrill Alperin/Taylor   HPI:  79 y.o. first seen in hospital 03/22/19.  History of throat cancer in remission and previous smoking. He had acute cholecystitis and presented in afib.  He has had issues with hematuria He converted with beta blockers No issues with his gallbladder surgery CHADVASC 3 Not anticoagulated due to issues with hematuria and conversion. Seen by Dr Ladona Ridgel 05/18/19 and some of his outpatient monitor showed asymptomatic NSVT but no PAF  TTE done in hospital 03/22/19 EF 65-70% MAC AV sclerosis and normal atrial sizes  He has no history of CAD  Montior:  ***  ***  ROS All other systems reviewed and negative except as noted above  Past Medical History:  Diagnosis Date  . Back pain   . BPH (benign prostatic hyperplasia)   . 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 DJD (degenerative joint disease)   . Esophageal reflux   . FHx: colonic polyps   . Hemorrhoids   . History of radiation therapy 08/28/15-- 10/16/15   Right Tonsil and bilateral neck  . Hypercholesteremia   . Hyperlipemia   . Hypertension   . Kidney cysts    [facility] [place] [facility]  . Posterior vitreous detachment, left eye    DR. KATHERINE HECKER  AUGUST 2011  . Radiation 08/28/15- 10/16/15   Right Tonsil and Bilateral Neck    Family History  Problem Relation Age of Onset  . Hypertension Mother     Social History   Socioeconomic History  . Marital status: Married    Spouse name: Not on file  . Number of children: Not on file  . Years of education: Not on file  . Highest education level: Not on file  Occupational History  . Not on file  Tobacco Use  . Smoking status: Former Games developer  . Smokeless tobacco: Never Used  . Tobacco comment: quit 35 years ago  Substance and Sexual Activity  . Alcohol use: No   Alcohol/week: 0.0 standard drinks  . Drug use: No  . Sexual activity: Not on file  Other Topics Concern  . Not on file  Social History Narrative   TOBACCO USE CIGARETTES: NEVER SMOKED.NO SMOKING.NO ALCOHOL .CAFFEINE YES:NO  RECREATIONAL DRUGS. OCCUPATION :RETIRED   MARTIAL STATUS : MARRIED    Social Determinants of Health   Financial Resource Strain:   . Difficulty of Paying Living Expenses:   Food Insecurity:   . Worried About Programme researcher, broadcasting/film/video in the Last Year:   . Barista in the Last Year:   Transportation Needs: No Transportation Needs  . Lack of Transportation (Medical): No  . Lack of Transportation (Non-Medical): No  Physical Activity:   . Days of Exercise per Week:   . Minutes of Exercise per Session:   Stress:   . Feeling of Stress :   Social Connections:   . Frequency of Communication with Friends and Family:   . Frequency of Social Gatherings with Friends and Family:   . Attends Religious Services:   . Active Member of Clubs or Organizations:   . Attends Banker Meetings:   Marland Kitchen Marital Status:   Intimate Partner Violence:   . Fear of Current or Ex-Partner:   . Emotionally Abused:   Marland Kitchen Physically Abused:   . Sexually Abused:  Past Surgical History:  Procedure Laterality Date  . CARDIAC CATHETERIZATION     remote >15 years ago  . CHOLECYSTECTOMY N/A 03/23/2019   Procedure: LAPAROSCOPIC CHOLECYSTECTOMY;  Surgeon: Abigail Miyamoto, MD;  Location: [facility];  Service: General;  Laterality: N/A;  . IR GASTROSTOMY TUBE REMOVAL  02/24/2017  . IR GENERIC HISTORICAL  10/11/2015   IR PATIENT EVAL TECH 0-60 MINS 10/11/2015 Darrell K Allred, PA-C [facility]  . IR PATIENT EVAL TECH 0-60 MINS  07/05/2016  . IR PATIENT EVAL TECH 0-60 MINS  08/14/2016  . IR PATIENT EVAL TECH 0-60 MINS  12/09/2016  . IR REMOVAL TUN ACCESS W/ PORT W/O FL MOD SED  07/22/2016  . VASECTOMY  1975      Current Outpatient Medications:  .  chlorhexidine (PERIDEX) 0.12 % solution,  Use as directed 15 mLs in the mouth or throat 2 (two) times daily. Mouth rinse, Disp: , Rfl:  .  FOLIC ACID PO, Take by mouth daily., Disp: , Rfl:  .  methotrexate (50 MG/ML) 1 g injection, Inject into the vein once a week., Disp: , Rfl:  .  metoprolol tartrate (LOPRESSOR) 25 MG tablet, Take 1 tablet (25 mg total) by mouth 3 (three) times daily., Disp: 60 tablet, Rfl: 1 .  oxyCODONE (OXY IR/ROXICODONE) 5 MG immediate release tablet, Take 1 tablet (5 mg total) by mouth every 6 (six) hours as needed for moderate pain., Disp: 20 tablet, Rfl: 0 .  polyethylene glycol (MIRALAX / GLYCOLAX) 17 g packet, Take 17 g by mouth daily as needed for mild constipation., Disp: 14 each, Rfl: 0 .  predniSONE (DELTASONE) 10 MG tablet, Take 10 mg by mouth daily with breakfast., Disp: , Rfl:  .  terazosin (HYTRIN) 2 MG capsule, Take 2 mg by mouth at bedtime., Disp: , Rfl:     Physical Exam: There were no vitals taken for this visit.    Affect appropriate Elderly male  HEENT: normal Neck supple with no adenopathy JVP normal no bruits no thyromegaly Lungs clear with no wheezing and good diaphragmatic motion Heart:  S1/S2 AV sclerosis murmur, no rub, gallop or click PMI normal Abdomen: benighn, BS positve, no tenderness, no AAA no bruit.  No HSM or HJR post lap choly Distal pulses intact with no bruits No edema Neuro non-focal Skin warm and dry No muscular weakness   Labs:   Lab Results  Component Value Date   WBC 7.2 04/26/2019   HGB 15.1 04/26/2019   HCT 44.2 04/26/2019   MCV 94.4 04/26/2019   PLT 176 04/26/2019   No results for input(s): NA, K, CL, CO2, BUN, CREATININE, CALCIUM, PROT, BILITOT, ALKPHOS, ALT, AST, GLUCOSE in the last 168 hours.  Invalid input(s): LABALBU No results found for: CKTOTAL, CKMB, CKMBINDEX, TROPONINI No results found for: CHOL No results found for: HDL No results found for: LDLCALC No results found for: TRIG No results found for: CHOLHDL No results found for:  LDLDIRECT    Radiology: No results found.  EKG: SR rate 66 normal 05/07/19   ASSESSMENT AND PLAN:   1. PaF:  Short lived during admission for cholecystitis Spontaneous conversion on beta blocker Seen by EP Dr Ladona Ridgel indicated would not anticoagulate and only beta blocker for NSVT with no symptoms and normal LV function on echo   2. HtN:  Well controlled.  Continue current medications and low sodium Dash type diet.     SignedCharlton Haws 05/20/2019, 3:18 PM

## 2019-06-03 ENCOUNTER — Telehealth: Payer: Self-pay | Admitting: Cardiovascular Disease

## 2019-06-03 ENCOUNTER — Ambulatory Visit: Payer: Medicare Other | Admitting: Cardiovascular Disease

## 2019-06-03 NOTE — Telephone Encounter (Signed)
New message:    Patient wife calling stating the patient can not come today he running a fever, and sick on the stomach. Patient also having pain on the right side going around to his back. Patient wife tried to get him to go to the ER. But the patient fell and she could not get him up. They would like to speak with some one concerning this matter,also need sooner apt than June.

## 2019-06-03 NOTE — Telephone Encounter (Signed)
Called patient's wife back. Informed her that she needs to call patient's PCP about his symptoms fever, upset stomach, falling, etc. Patient's wife stated she was able to get her husband back in the bed when he fell last night. Encouraged her to call EMS if he falls and she cannot get him up. Informed patient's wife that we can cancel his appointment for today and reschedule. Patient's wife will call PCP. Patient rescheduled for next Thursday. Will forward to Dr. Johnsie Cancel, so he is aware.

## 2019-06-04 ENCOUNTER — Encounter (HOSPITAL_COMMUNITY): Payer: Self-pay | Admitting: Pediatrics

## 2019-06-04 ENCOUNTER — Ambulatory Visit
Admission: RE | Admit: 2019-06-04 | Discharge: 2019-06-04 | Disposition: A | Discharge status: Home / Self Care | Payer: Medicare Other | Source: Ambulatory Visit | Attending: Internal Medicine | Admitting: Internal Medicine

## 2019-06-04 ENCOUNTER — Other Ambulatory Visit: Payer: Self-pay | Admitting: Internal Medicine

## 2019-06-04 ENCOUNTER — Inpatient Hospital Stay (HOSPITAL_COMMUNITY)
Admission: EM | Admit: 2019-06-04 | Discharge: 2019-06-06 | DRG: 446 | Disposition: A | Discharge status: Home / Self Care | Payer: Medicare Other | Attending: Internal Medicine | Admitting: Internal Medicine

## 2019-06-04 ENCOUNTER — Other Ambulatory Visit: Payer: Self-pay

## 2019-06-04 DIAGNOSIS — K5900 Constipation, unspecified: Secondary | ICD-10-CM | POA: Diagnosis not present

## 2019-06-04 DIAGNOSIS — I129 Hypertensive chronic kidney disease with stage 1 through stage 4 chronic kidney disease, or unspecified chronic kidney disease: Secondary | ICD-10-CM | POA: Diagnosis present

## 2019-06-04 DIAGNOSIS — N400 Enlarged prostate without lower urinary tract symptoms: Secondary | ICD-10-CM | POA: Diagnosis present

## 2019-06-04 DIAGNOSIS — Z85818 Personal history of malignant neoplasm of other sites of lip, oral cavity, and pharynx: Secondary | ICD-10-CM

## 2019-06-04 DIAGNOSIS — M1990 Unspecified osteoarthritis, unspecified site: Secondary | ICD-10-CM | POA: Diagnosis present

## 2019-06-04 DIAGNOSIS — R001 Bradycardia, unspecified: Secondary | ICD-10-CM

## 2019-06-04 DIAGNOSIS — N200 Calculus of kidney: Secondary | ICD-10-CM | POA: Diagnosis not present

## 2019-06-04 DIAGNOSIS — R112 Nausea with vomiting, unspecified: Secondary | ICD-10-CM | POA: Diagnosis not present

## 2019-06-04 DIAGNOSIS — N182 Chronic kidney disease, stage 2 (mild): Secondary | ICD-10-CM | POA: Diagnosis not present

## 2019-06-04 DIAGNOSIS — Z881 Allergy status to other antibiotic agents status: Secondary | ICD-10-CM

## 2019-06-04 DIAGNOSIS — R1010 Upper abdominal pain, unspecified: Secondary | ICD-10-CM | POA: Diagnosis not present

## 2019-06-04 DIAGNOSIS — Z9221 Personal history of antineoplastic chemotherapy: Secondary | ICD-10-CM

## 2019-06-04 DIAGNOSIS — E7800 Pure hypercholesterolemia, unspecified: Secondary | ICD-10-CM | POA: Diagnosis present

## 2019-06-04 DIAGNOSIS — E785 Hyperlipidemia, unspecified: Secondary | ICD-10-CM | POA: Diagnosis not present

## 2019-06-04 DIAGNOSIS — I10 Essential (primary) hypertension: Secondary | ICD-10-CM | POA: Diagnosis present

## 2019-06-04 DIAGNOSIS — Z87891 Personal history of nicotine dependence: Secondary | ICD-10-CM

## 2019-06-04 DIAGNOSIS — R7989 Other specified abnormal findings of blood chemistry: Secondary | ICD-10-CM

## 2019-06-04 DIAGNOSIS — R109 Unspecified abdominal pain: Secondary | ICD-10-CM | POA: Diagnosis present

## 2019-06-04 DIAGNOSIS — K8050 Calculus of bile duct without cholangitis or cholecystitis without obstruction: Secondary | ICD-10-CM | POA: Diagnosis not present

## 2019-06-04 DIAGNOSIS — R1011 Right upper quadrant pain: Secondary | ICD-10-CM | POA: Diagnosis not present

## 2019-06-04 DIAGNOSIS — C099 Malignant neoplasm of tonsil, unspecified: Secondary | ICD-10-CM | POA: Diagnosis present

## 2019-06-04 DIAGNOSIS — R945 Abnormal results of liver function studies: Secondary | ICD-10-CM | POA: Diagnosis not present

## 2019-06-04 DIAGNOSIS — Z9049 Acquired absence of other specified parts of digestive tract: Secondary | ICD-10-CM

## 2019-06-04 DIAGNOSIS — Z20822 Contact with and (suspected) exposure to covid-19: Secondary | ICD-10-CM | POA: Diagnosis not present

## 2019-06-04 DIAGNOSIS — Z8249 Family history of ischemic heart disease and other diseases of the circulatory system: Secondary | ICD-10-CM

## 2019-06-04 DIAGNOSIS — Z7952 Long term (current) use of systemic steroids: Secondary | ICD-10-CM

## 2019-06-04 DIAGNOSIS — Z03818 Encounter for observation for suspected exposure to other biological agents ruled out: Secondary | ICD-10-CM | POA: Diagnosis not present

## 2019-06-04 DIAGNOSIS — Z888 Allergy status to other drugs, medicaments and biological substances status: Secondary | ICD-10-CM

## 2019-06-04 DIAGNOSIS — K219 Gastro-esophageal reflux disease without esophagitis: Secondary | ICD-10-CM | POA: Diagnosis present

## 2019-06-04 DIAGNOSIS — I480 Paroxysmal atrial fibrillation: Secondary | ICD-10-CM | POA: Diagnosis not present

## 2019-06-04 DIAGNOSIS — Z923 Personal history of irradiation: Secondary | ICD-10-CM

## 2019-06-04 DIAGNOSIS — L439 Lichen planus, unspecified: Secondary | ICD-10-CM | POA: Diagnosis present

## 2019-06-04 DIAGNOSIS — R82998 Other abnormal findings in urine: Secondary | ICD-10-CM | POA: Diagnosis not present

## 2019-06-04 DIAGNOSIS — R61 Generalized hyperhidrosis: Secondary | ICD-10-CM | POA: Diagnosis not present

## 2019-06-04 DIAGNOSIS — I441 Atrioventricular block, second degree: Secondary | ICD-10-CM | POA: Diagnosis present

## 2019-06-04 DIAGNOSIS — Z79899 Other long term (current) drug therapy: Secondary | ICD-10-CM

## 2019-06-04 MED ORDER — SODIUM CHLORIDE 0.9 % IV SOLN: INTRAVENOUS | Status: DC

## 2019-06-04 NOTE — ED Notes (Signed)
Pt ambulatory to ED Rm 27 from [place]. Pt states he was seen by his provate MD for generalized abd pain that radiates to central back. Pt states "my doctor sent me here because something is obstructing my liver." Pt is A&OX4. Breathing easy, non-labored. Speaking in full sentences. Ambulatory to and from BR with strong, steady gait

## 2019-06-04 NOTE — ED Triage Notes (Signed)
Patient reported was sent by Dr. [name]. States he was seeing Dr. [name] d/t discoloration of urine along w/ some abdominal pain S/P chole in 03/2019; Patient endorsed clinic was suppose to fax [place] and records

## 2019-06-04 NOTE — ED Notes (Signed)
PIV initiated, 18G to R forearm. IV flushes with 10 cc NS without s/s of infiltration. Positive blood return noted. Secured with tape and tegaderm. Labs drawn, labeled with 2 pt identifiers, and sent to lab

## 2019-06-04 NOTE — ED Provider Notes (Signed)
TIME SEEN: 11:23 PM  CHIEF COMPLAINT: Abdominal pain, abnormal labs  HPI: Patient is a 79 year old male with history of tonsillar cancer in remission status post chemotherapy and radiation, hypertension, hyperlipidemia who presents to the emergency department for concerns for right upper abdominal pain, vomiting, dark urine and abnormal labs done by his PCP Dr. Inda Hanson today.  Worsening RUQ pain x 2 days.  One episode of nonbloody, nonbilious vomiting 2 days ago and then pain improved.  Has had night sweats x 2 days.  No fever.  Normal BM today.  Reports pain is a 1/10 currently.  Patient underwent laparoscopic cholecystectomy by Dr. Ninfa Hanson on 03/23/2019 for acute gangrenous cholecystitis with cholelithiasis.  On MTX for lichen planus.  Chemo finished in 2017.  Labs show ALP 146, ALT 193, AST 96, T bili 5.2, D bili 3.5, Indirect bili 1.6, total protein 6.5, lipase 18.  Abdominal x-ray showed no significant abnormality.  Reports ultrasound was done in the office and was told that he had a blockage in his bile duct.  ROS: See HPI Constitutional: no fever  Eyes: no drainage  ENT: no runny nose   Cardiovascular:  no chest pain  Resp: no SOB  GI: vomiting GU: no dysuria Integumentary: no rash  Allergy: no hives  Musculoskeletal: no leg swelling  Neurological: no slurred speech ROS otherwise negative  PAST MEDICAL HISTORY/PAST SURGICAL HISTORY:  Past Medical History:  Diagnosis Date  . Back pain   . BPH (benign prostatic hyperplasia)   . Cancer of tonsil, palatine ([place]) 07/12/2015  . DJD (degenerative joint disease)   . Esophageal reflux   . FHx: colonic polyps   . Hemorrhoids   . History of radiation therapy 08/28/15-- 10/16/15   Right Tonsil and bilateral neck  . Hypercholesteremia   . Hyperlipemia   . Hypertension   . Kidney cysts    [facility] [place] [facility]  . Posterior vitreous detachment, left eye    DR. KATHERINE Hanson  AUGUST 2011  . Radiation 08/28/15-  10/16/15   Right Tonsil and Bilateral Neck    MEDICATIONS:  Prior to Admission medications   Medication Sig Start Date End Date Taking? Authorizing Provider  chlorhexidine (PERIDEX) 0.12 % solution Use as directed 15 mLs in the mouth or throat 2 (two) times daily. Mouth rinse    [provider]  FOLIC ACID PO Take by mouth daily.    [provider]  methotrexate (50 MG/ML) 1 g injection Inject into the vein once a week.    [provider]  metoprolol tartrate (LOPRESSOR) 25 MG tablet Take 1 tablet (25 mg total) by mouth 3 (three) times daily. 03/25/19   Focht, Fraser Din, PA  oxyCODONE (OXY IR/ROXICODONE) 5 MG immediate release tablet Take 1 tablet (5 mg total) by mouth every 6 (six) hours as needed for moderate pain. 03/25/19   Focht, Fraser Din, PA  polyethylene glycol (MIRALAX / GLYCOLAX) 17 g packet Take 17 g by mouth daily as needed for mild constipation. 03/25/19   Focht, Fraser Din, PA  predniSONE (DELTASONE) 10 MG tablet Take 10 mg by mouth daily with breakfast.    [provider]  terazosin (HYTRIN) 2 MG capsule Take 2 mg by mouth at bedtime.    [provider]    ALLERGIES:  Allergies  Allergen Reactions  . Lisinopril Cough  . Acyclovir And Related Other (See Comments)    Fever, chills   . Famciclovir Other (See Comments)    Fever,  chills    SOCIAL HISTORY:  Social History   Tobacco Use  . Smoking status: Former Research scientist (life sciences)  . Smokeless tobacco: Never Used  . Tobacco comment: quit 35 years ago  Substance Use Topics  . Alcohol use: No    Alcohol/week: 0.0 standard drinks    FAMILY HISTORY: Family History  Problem Relation Age of Onset  . Hypertension Mother     EXAM: BP (!) 165/84 (BP Location: Left Arm)   Pulse 93   Temp 97.9 F (36.6 C) (Oral)   Resp 16   Ht 6' (1.829 m)   Wt 90.7 kg   SpO2 97%   BMI 27.12 kg/m  CONSTITUTIONAL: Alert and oriented and responds appropriately to questions.  Elderly, afebrile, no  distress HEAD: Normocephalic EYES: Conjunctivae clear, pupils appear equal, EOM appear intact, no significant scleral icterus appreciated ENT: normal nose; moist mucous membranes NECK: Supple, normal ROM CARD: RRR; S1 and S2 appreciated; no murmurs, no clicks, no rubs, no gallops RESP: Normal chest excursion without splinting or tachypnea; breath sounds clear and equal bilaterally; no wheezes, no rhonchi, no rales, no hypoxia or respiratory distress, speaking full sentences ABD/GI: Normal bowel sounds; non-distended; soft, non-tender, no rebound, no guarding, no peritoneal signs, no hepatosplenomegaly, scars in the right upper quadrant that are well-healed BACK:  The back appears normal EXT: Normal ROM in all joints; no deformity noted, no edema; no cyanosis SKIN: Normal color for age and race; warm; no rash on exposed skin, no significant jaundice appreciated NEURO: Moves all extremities equally PSYCH: The patient's mood and manner are appropriate.   MEDICAL DECISION MAKING: Patient sent here for what sounds like choledocholithiasis but I do not have access to images done in the [facility] clinic today and no report for his ultrasound.  Will repeat labs and ultrasound here.  He declines pain or nausea medicine.  We will keep him n.p.o. and anticipate admission.  Will obtain Covid swab.  ED PROGRESS: Labs continue to show persistently elevated liver function tests however total bilirubin seems to have improved compared to labs done in the PCPs office.  No leukocytosis.  Normal INR.  Normal lipase.  Urine does not appear infected here.  Ultrasound shows no choledocholithiasis or biliary dilatation.  Patient may need MRCP given patient and wife reports he was told he had choledocholithiasis but I do not have access to this report or image.  We will add on hepatitis panel.  Denies alcohol or Tylenol use.  Will discuss with hospitalist.   2:12 AM Discussed patient's case with hospitalist, Dr. Hal Hope.   I have recommended admission and patient (and family if present) agree with this plan. Admitting physician will place admission orders.   I reviewed all nursing notes, vitals, pertinent previous records and interpreted all EKGs, lab and urine results, imaging (as available).    EKG Interpretation  Date/Time:  Friday June 04 2019 23:39:59 EDT Ventricular Rate:  70 PR Interval:    QRS Duration: 86 QT Interval:  366 QTC Calculation: 395 R Axis:   60 Text Interpretation: Sinus or ectopic atrial rhythm Atrial premature complex ST depr, consider ischemia, inferior leads Baseline wander in lead(s) I Confirmed by Randy Hanson, Randy Hanson [phone]) on 06/05/2019 12:00:50 AM         EKG Interpretation  Date/Time:  Saturday June 05 2019 00:03:08 EDT Ventricular Rate:  68 PR Interval:    QRS Duration: 84 QT Interval:  391 QTC Calculation: 431 R Axis:   62 Text Interpretation: Sinus rhythm Atrial  premature complexes Borderline ST depression, diffuse leads Minimal ST elevation, lateral leads Baseline wander in lead(s) V5 Inferior ST depression seen on previous EKG improved with improvement of baseline wander and appears similar to previous EKGs Confirmed by Randy Hanson [phone]) on 06/05/2019 12:08:09 AM         Randy Hanson was evaluated in Emergency Department on 06/04/2019 for the symptoms described in the history of present illness. He was evaluated in the context of the global COVID-19 pandemic, which necessitated consideration that the patient might be at risk for infection with the SARS-CoV-2 virus that causes COVID-19. Institutional protocols and algorithms that pertain to the evaluation of patients at risk for COVID-19 are in a state of rapid change based on information released by regulatory bodies including the CDC and federal and state organizations. These policies and algorithms were followed during the patient's care in the ED.  Patient was seen wearing N95, face shield, gloves.    Randy Hanson,  Randy Bison, DO 06/05/19 [phone]

## 2019-06-04 NOTE — ED Notes (Signed)
Patient has paper copy of lab work and imaging results.

## 2019-06-04 NOTE — ED Notes (Addendum)
Pt's family is very upset about wait time. Tech tried to explain process but family was not accepting of that answer, stating "his doctor told him to get over here right away but he is not being seen". Wife stated that his doctor had called up here and spoke to someone and guaranteed them it should not be in longer than 30 minutes. Tech told them that they should call the doctor and asked him who he had spoken to and have the doctor call charge directly. Tech tried to hand the family a card to call charge themselves because we cannot make changes and family refused to take the card. After multiple times of trying to explain, tech stopped trying because it changed nothing.

## 2019-06-05 ENCOUNTER — Observation Stay (HOSPITAL_COMMUNITY): Payer: Medicare Other

## 2019-06-05 ENCOUNTER — Encounter (HOSPITAL_COMMUNITY): Payer: Self-pay | Admitting: Internal Medicine

## 2019-06-05 ENCOUNTER — Emergency Department (HOSPITAL_COMMUNITY): Payer: Medicare Other

## 2019-06-05 DIAGNOSIS — Z87891 Personal history of nicotine dependence: Secondary | ICD-10-CM | POA: Diagnosis not present

## 2019-06-05 DIAGNOSIS — Z8249 Family history of ischemic heart disease and other diseases of the circulatory system: Secondary | ICD-10-CM | POA: Diagnosis not present

## 2019-06-05 DIAGNOSIS — R7989 Other specified abnormal findings of blood chemistry: Secondary | ICD-10-CM | POA: Diagnosis not present

## 2019-06-05 DIAGNOSIS — M1990 Unspecified osteoarthritis, unspecified site: Secondary | ICD-10-CM | POA: Diagnosis present

## 2019-06-05 DIAGNOSIS — I10 Essential (primary) hypertension: Secondary | ICD-10-CM | POA: Diagnosis not present

## 2019-06-05 DIAGNOSIS — Z888 Allergy status to other drugs, medicaments and biological substances status: Secondary | ICD-10-CM | POA: Diagnosis not present

## 2019-06-05 DIAGNOSIS — Z79899 Other long term (current) drug therapy: Secondary | ICD-10-CM | POA: Diagnosis not present

## 2019-06-05 DIAGNOSIS — E785 Hyperlipidemia, unspecified: Secondary | ICD-10-CM | POA: Diagnosis present

## 2019-06-05 DIAGNOSIS — R935 Abnormal findings on diagnostic imaging of other abdominal regions, including retroperitoneum: Secondary | ICD-10-CM | POA: Diagnosis not present

## 2019-06-05 DIAGNOSIS — L439 Lichen planus, unspecified: Secondary | ICD-10-CM | POA: Diagnosis present

## 2019-06-05 DIAGNOSIS — I480 Paroxysmal atrial fibrillation: Secondary | ICD-10-CM | POA: Diagnosis present

## 2019-06-05 DIAGNOSIS — R1013 Epigastric pain: Secondary | ICD-10-CM | POA: Diagnosis not present

## 2019-06-05 DIAGNOSIS — R001 Bradycardia, unspecified: Secondary | ICD-10-CM | POA: Diagnosis not present

## 2019-06-05 DIAGNOSIS — Z85818 Personal history of malignant neoplasm of other sites of lip, oral cavity, and pharynx: Secondary | ICD-10-CM | POA: Diagnosis not present

## 2019-06-05 DIAGNOSIS — I441 Atrioventricular block, second degree: Secondary | ICD-10-CM | POA: Diagnosis present

## 2019-06-05 DIAGNOSIS — R109 Unspecified abdominal pain: Secondary | ICD-10-CM | POA: Diagnosis present

## 2019-06-05 DIAGNOSIS — N400 Enlarged prostate without lower urinary tract symptoms: Secondary | ICD-10-CM | POA: Diagnosis present

## 2019-06-05 DIAGNOSIS — Z9049 Acquired absence of other specified parts of digestive tract: Secondary | ICD-10-CM | POA: Diagnosis not present

## 2019-06-05 DIAGNOSIS — K8050 Calculus of bile duct without cholangitis or cholecystitis without obstruction: Secondary | ICD-10-CM | POA: Diagnosis present

## 2019-06-05 DIAGNOSIS — Z9221 Personal history of antineoplastic chemotherapy: Secondary | ICD-10-CM | POA: Diagnosis not present

## 2019-06-05 DIAGNOSIS — E7800 Pure hypercholesterolemia, unspecified: Secondary | ICD-10-CM | POA: Diagnosis present

## 2019-06-05 DIAGNOSIS — N182 Chronic kidney disease, stage 2 (mild): Secondary | ICD-10-CM | POA: Diagnosis present

## 2019-06-05 DIAGNOSIS — C099 Malignant neoplasm of tonsil, unspecified: Secondary | ICD-10-CM | POA: Diagnosis not present

## 2019-06-05 DIAGNOSIS — Z881 Allergy status to other antibiotic agents status: Secondary | ICD-10-CM | POA: Diagnosis not present

## 2019-06-05 DIAGNOSIS — I129 Hypertensive chronic kidney disease with stage 1 through stage 4 chronic kidney disease, or unspecified chronic kidney disease: Secondary | ICD-10-CM | POA: Diagnosis present

## 2019-06-05 DIAGNOSIS — R1011 Right upper quadrant pain: Secondary | ICD-10-CM | POA: Diagnosis not present

## 2019-06-05 DIAGNOSIS — Z7952 Long term (current) use of systemic steroids: Secondary | ICD-10-CM | POA: Diagnosis not present

## 2019-06-05 DIAGNOSIS — Z03818 Encounter for observation for suspected exposure to other biological agents ruled out: Secondary | ICD-10-CM | POA: Diagnosis not present

## 2019-06-05 DIAGNOSIS — K219 Gastro-esophageal reflux disease without esophagitis: Secondary | ICD-10-CM | POA: Diagnosis present

## 2019-06-05 DIAGNOSIS — Z20822 Contact with and (suspected) exposure to covid-19: Secondary | ICD-10-CM | POA: Diagnosis present

## 2019-06-05 DIAGNOSIS — R945 Abnormal results of liver function studies: Secondary | ICD-10-CM | POA: Diagnosis not present

## 2019-06-05 DIAGNOSIS — K449 Diaphragmatic hernia without obstruction or gangrene: Secondary | ICD-10-CM | POA: Diagnosis not present

## 2019-06-05 DIAGNOSIS — Z923 Personal history of irradiation: Secondary | ICD-10-CM | POA: Diagnosis not present

## 2019-06-05 LAB — CBC WITH DIFFERENTIAL/PLATELET
Abs Immature Granulocytes: 0.04 10*3/uL (ref 0.00–0.07)
Basophils Absolute: 0 10*3/uL (ref 0.0–0.1)
Basophils Relative: 0 %
Eosinophils Absolute: 0.1 10*3/uL (ref 0.0–0.5)
Eosinophils Relative: 2 %
HCT: 42.4 % (ref 39.0–52.0)
Hemoglobin: 14.3 g/dL (ref 13.0–17.0)
Immature Granulocytes: 1 %
Lymphocytes Relative: 6 %
Lymphs Abs: 0.4 10*3/uL — ABNORMAL LOW (ref 0.7–4.0)
MCH: 32.9 pg (ref 26.0–34.0)
MCHC: 33.7 g/dL (ref 30.0–36.0)
MCV: 97.7 fL (ref 80.0–100.0)
Monocytes Absolute: 0.5 10*3/uL (ref 0.1–1.0)
Monocytes Relative: 8 %
Neutro Abs: 5.6 10*3/uL (ref 1.7–7.7)
Neutrophils Relative %: 83 %
Platelets: 205 10*3/uL (ref 150–400)
RBC: 4.34 MIL/uL (ref 4.22–5.81)
RDW: 14.2 % (ref 11.5–15.5)
WBC: 6.7 10*3/uL (ref 4.0–10.5)
nRBC: 0 % (ref 0.0–0.2)

## 2019-06-05 LAB — COMPREHENSIVE METABOLIC PANEL
ALT: 163 U/L — ABNORMAL HIGH (ref 0–44)
AST: 71 U/L — ABNORMAL HIGH (ref 15–41)
Albumin: 3.4 g/dL — ABNORMAL LOW (ref 3.5–5.0)
Alkaline Phosphatase: 139 U/L — ABNORMAL HIGH (ref 38–126)
Anion gap: 14 (ref 5–15)
BUN: 17 mg/dL (ref 8–23)
CO2: 22 mmol/L (ref 22–32)
Calcium: 9.3 mg/dL (ref 8.9–10.3)
Chloride: 104 mmol/L (ref 98–111)
Creatinine, Ser: 1.25 mg/dL — ABNORMAL HIGH (ref 0.61–1.24)
GFR calc Af Amer: >60 mL/min (ref >60)
GFR calc non Af Amer: 56 mL/min — ABNORMAL LOW (ref >60)
Glucose, Bld: 105 mg/dL — ABNORMAL HIGH (ref 70–99)
Potassium: 3.7 mmol/L (ref 3.5–5.1)
Sodium: 140 mmol/L (ref 135–145)
Total Bilirubin: 2.4 mg/dL — ABNORMAL HIGH (ref 0.3–1.2)
Total Protein: 6.2 g/dL — ABNORMAL LOW (ref 6.5–8.1)

## 2019-06-05 LAB — PROTIME-INR
INR: 1 (ref 0.8–1.2)
Prothrombin Time: 12.7 seconds (ref 11.4–15.2)

## 2019-06-05 LAB — URINALYSIS, ROUTINE W REFLEX MICROSCOPIC
Bilirubin Urine: NEGATIVE
Glucose, UA: NEGATIVE mg/dL
Ketones, ur: 5 mg/dL — AB
Leukocytes,Ua: NEGATIVE
Nitrite: NEGATIVE
Protein, ur: NEGATIVE mg/dL
Specific Gravity, Urine: 1.01 (ref 1.005–1.030)
pH: 5 (ref 5.0–8.0)

## 2019-06-05 LAB — HEPATITIS PANEL, ACUTE
HCV Ab: NONREACTIVE
Hep A IgM: NONREACTIVE
Hep B C IgM: NONREACTIVE
Hepatitis B Surface Ag: NONREACTIVE

## 2019-06-05 LAB — LIPASE, BLOOD: Lipase: 25 U/L (ref 11–51)

## 2019-06-05 LAB — SARS CORONAVIRUS 2 (TAT 6-24 HRS): SARS Coronavirus 2: NEGATIVE

## 2019-06-05 MED ORDER — VITAMIN B-12 1000 MCG PO TABS
1000.0000 ug | ORAL_TABLET | Freq: Every day | ORAL | Status: DC
  Administered 2019-06-05 – 2019-06-06 (×2): 1000 ug via ORAL
  Filled 2019-06-05 (×2): qty 1

## 2019-06-05 MED ORDER — ACETAMINOPHEN 650 MG RE SUPP: 650.0000 mg | Freq: Four times a day (QID) | RECTAL | Status: DC | PRN

## 2019-06-05 MED ORDER — TACROLIMUS 1 MG PO CAPS
1.0000 mg | ORAL_CAPSULE | Freq: Two times a day (BID) | ORAL | Status: DC
  Administered 2019-06-05 – 2019-06-06 (×3): 1 mg via ORAL
  Filled 2019-06-05 (×5): qty 1

## 2019-06-05 MED ORDER — METOPROLOL TARTRATE 25 MG PO TABS
25.0000 mg | ORAL_TABLET | Freq: Three times a day (TID) | ORAL | Status: DC
  Administered 2019-06-05 – 2019-06-06 (×4): 25 mg via ORAL
  Filled 2019-06-05 (×4): qty 1

## 2019-06-05 MED ORDER — TERAZOSIN HCL 1 MG PO CAPS
2.0000 mg | ORAL_CAPSULE | Freq: Two times a day (BID) | ORAL | Status: DC
  Administered 2019-06-05 – 2019-06-06 (×3): 2 mg via ORAL
  Filled 2019-06-05: qty 1
  Filled 2019-06-05 (×4): qty 2

## 2019-06-05 MED ORDER — ONDANSETRON HCL 4 MG/2ML IJ SOLN: 4.0000 mg | Freq: Four times a day (QID) | INTRAMUSCULAR | Status: DC | PRN

## 2019-06-05 MED ORDER — PREDNISONE 10 MG PO TABS
10.0000 mg | ORAL_TABLET | Freq: Every day | ORAL | Status: DC
  Administered 2019-06-05 – 2019-06-06 (×2): 10 mg via ORAL
  Filled 2019-06-05 (×2): qty 1

## 2019-06-05 MED ORDER — ONDANSETRON HCL 4 MG PO TABS: 4.0000 mg | ORAL_TABLET | Freq: Four times a day (QID) | ORAL | Status: DC | PRN

## 2019-06-05 MED ORDER — ACETAMINOPHEN 325 MG PO TABS
650.0000 mg | ORAL_TABLET | Freq: Four times a day (QID) | ORAL | Status: DC | PRN
  Administered 2019-06-05: 650 mg via ORAL
  Filled 2019-06-05: qty 2

## 2019-06-05 MED ORDER — FOLIC ACID 1 MG PO TABS
1.0000 mg | ORAL_TABLET | Freq: Every day | ORAL | Status: DC
  Administered 2019-06-05 – 2019-06-06 (×2): 1 mg via ORAL
  Filled 2019-06-05 (×2): qty 1

## 2019-06-05 MED ORDER — ALUM & MAG HYDROXIDE-SIMETH 200-200-20 MG/5ML PO SUSP: 15.0000 mL | ORAL | Status: DC | PRN

## 2019-06-05 NOTE — ED Notes (Signed)
Pt back from [place] without incidence

## 2019-06-05 NOTE — ED Notes (Signed)
Pt eating lunch

## 2019-06-05 NOTE — ED Notes (Signed)
Fluids initiated per [facility]. Name/dOB verified with pt. Covid swab collected, labeled with 2 pt identifiers, and brought to lab

## 2019-06-05 NOTE — ED Notes (Signed)
Unsuccessful attempt to call report  They will call back

## 2019-06-05 NOTE — Progress Notes (Signed)
New Admission Note:   Arrival Method:  bed Mental Orientation:  Alert & Oirented x4 Telemetry: On  Assessment: Completed Skin: intact with bruises right upper arm  IV: NSL right forearm  Pain: Denies Tubes: none Safety Measures: Safety Fall Prevention Plan has been given, discussed and signed Admission: Completed [facility] Orientation: Patient has been orientated to the room, unit and staff.  Family: alone  Orders have been reviewed and implemented. Will continue to monitor the patient. Call light has been placed within reach and bed alarm has been activated.   Dolores Hoose, RN

## 2019-06-05 NOTE — ED Notes (Signed)
Care endorsed to [place], [place]

## 2019-06-05 NOTE — Plan of Care (Signed)
  Problem: Education: Goal: Knowledge of General Education information will improve Description: Including pain rating scale, medication(s)/side effects and non-pharmacologic comfort measures Outcome: Progressing   Problem: Clinical Measurements: Goal: Diagnostic test results will improve Outcome: Progressing   Problem: Pain Managment: Goal: General experience of comfort will improve Outcome: Progressing   

## 2019-06-05 NOTE — ED Notes (Signed)
Report called to rn on 5 m 

## 2019-06-05 NOTE — Progress Notes (Signed)
PROGRESS NOTE    Randy Hanson  WGN:[id number] DOB: 31-Oct-1940 DOA: 06/04/2019 PCP: [name], MD    Brief Narrative:  79 yo with hx HTN, lichen planus on immunosuppression, hx tonsillar cancer s/p radiation and in remission presented from PCP office with elevated LFT's with concern for biliary ductal obstruction.  Assessment & Plan:   Principal Problem:   Abdominal pain Active Problems:   Essential hypertension   Cancer of tonsil, palatine (HCC)   Elevated LFTs  1. Abdominal pain with elevated LFTs concerning for CBD stones ultrasound unremarkable.   1. MRCP personally reviewed, negative 2. Clinically improving. Pt tolerating clear liquid diet. Will advance diet as tolerated 3. Hepatitis panel is neg 4. Appreciate input by GI. Recommendation for continued observation at this point 5. Will repeat LFT's in AM 2. History of hypertension and NSVT 1. with normal LVEF 2. Continue on metoprolol as tolerated, stable at this time 3. History of lichen planus 1. Continue on prednisone and tacrolimus. 4. History of tonsillar cancer 1. Noted to be in remission status post radiation. 2. Stable at present 5. Chronic kidney disease stage II  1. Labs reviewed, renal function appears to be at baseline.  DVT prophylaxis: SCD's Code Status: Full Family Communication: Pt in room, family at bedside Disposition Plan: From home, plan d/c home when cleared by GI  Consultants:   GI  Procedures:   MRCP  Antimicrobials: Anti-infectives (From admission, onward)   None       Subjective: Feeling better today. Tolerating clear diet  Objective: Vitals:   06/05/19 1400 06/05/19 1430 06/05/19 1500 06/05/19 1530  BP: 113/72 119/71 135/70 137/73  Pulse: 61 68  67  Resp: 20 17 16 17   Temp:      TempSrc:      SpO2: 95% 95%  92%  Weight:      Height:       No intake or output data in the 24 hours ending 06/05/19 1622 Filed Weights   06/04/19 1548  Weight: 90.7 kg     Examination:  General exam: Appears calm and comfortable  Respiratory system: Clear to auscultation. Respiratory effort normal. Cardiovascular system: S1 & S2 heard, Regular Gastrointestinal system: Abdomen is nondistended, soft and nontender. No organomegaly or masses felt. Normal bowel sounds heard. Central nervous system: Alert and oriented. No focal neurological deficits. Extremities: Symmetric 5 x 5 power. Skin: No rashes, lesions Psychiatry: Judgement and insight appear normal. Mood & affect appropriate.   Data Reviewed: I have personally reviewed following labs and imaging studies  CBC: Recent Labs  Lab 06/04/19 2345  WBC 6.7  NEUTROABS 5.6  HGB 14.3  HCT 42.4  MCV 97.7  PLT 205   Basic Metabolic Panel: Recent Labs  Lab 06/04/19 2345  NA 140  K 3.7  CL 104  CO2 22  GLUCOSE 105*  BUN 17  CREATININE 1.25*  CALCIUM 9.3   GFR: Estimated Creatinine Clearance: 55.2 mL/min (A) (by C-G formula based on SCr of 1.25 mg/dL (H)). Liver Function Tests: Recent Labs  Lab 06/04/19 2345  AST 71*  ALT 163*  ALKPHOS 139*  BILITOT 2.4*  PROT 6.2*  ALBUMIN 3.4*   Recent Labs  Lab 06/04/19 2345  LIPASE 25   No results for input(s): AMMONIA in the last 168 hours. Coagulation Profile: Recent Labs  Lab 06/04/19 2345  INR 1.0   Cardiac Enzymes: No results for input(s): CKTOTAL, CKMB, CKMBINDEX, TROPONINI in the last 168 hours. BNP (last 3 results) No results  for input(s): PROBNP in the last 8760 hours. HbA1C: No results for input(s): HGBA1C in the last 72 hours. CBG: No results for input(s): GLUCAP in the last 168 hours. Lipid Profile: No results for input(s): CHOL, HDL, LDLCALC, TRIG, CHOLHDL, LDLDIRECT in the last 72 hours. Thyroid Function Tests: No results for input(s): TSH, T4TOTAL, FREET4, T3FREE, THYROIDAB in the last 72 hours. Anemia Panel: No results for input(s): VITAMINB12, FOLATE, FERRITIN, TIBC, IRON, RETICCTPCT in the last 72 hours. Sepsis  Labs: No results for input(s): PROCALCITON, LATICACIDVEN in the last 168 hours.  Recent Results (from the past 240 hour(s))  SARS CORONAVIRUS 2 (TAT 6-24 HRS) Nasopharyngeal Nasopharyngeal Swab     Status: None   Collection Time: 06/04/19 11:56 PM   Specimen: Nasopharyngeal Swab  Result Value Ref Range Status   SARS Coronavirus 2 NEGATIVE NEGATIVE Final    Comment: (NOTE) SARS-CoV-2 target nucleic acids are NOT DETECTED. The SARS-CoV-2 RNA is generally detectable in upper and lower respiratory specimens during the acute phase of infection. Negative results do not preclude SARS-CoV-2 infection, do not rule out co-infections with other pathogens, and should not be used as the sole basis for treatment or other patient management decisions. Negative results must be combined with clinical observations, patient history, and epidemiological information. The expected result is Negative. Fact Sheet for Patients: HairSlick.no Fact Sheet for Healthcare Providers: quierodirigir.com This test is not yet approved or cleared by the [place] FDA and  has been authorized for detection and/or diagnosis of SARS-CoV-2 by FDA under an Emergency Use Authorization (EUA). This EUA will remain  in effect (meaning this test can be used) for the duration of the COVID-19 declaration under Section 56 4(b)(1) of the Act, 21 U.S.C. section 360bbb-3(b)(1), unless the authorization is terminated or revoked sooner. Performed at [facility], [address] [place], [place] [address]      Radiology Studies: MR ABDOMEN MRCP WO CONTRAST  Result Date: 06/05/2019 CLINICAL DATA:  Right upper quadrant and epigastric abdominal pain. Elevated liver function tests. History of cholecystectomy and tonsillar cancer. EXAM: MRI ABDOMEN WITHOUT CONTRAST  (INCLUDING MRCP) TECHNIQUE: Multiplanar multisequence MR imaging of the abdomen was performed. Heavily  T2-weighted images of the biliary and pancreatic ducts were obtained, and three-dimensional MRCP images were rendered by post processing. COMPARISON:  06/05/2019 abdominal sonogram. 07/04/2016 PET-CT. FINDINGS: Lower chest: Trace dependent bilateral pleural effusions. Hepatobiliary: Normal liver size and configuration. No hepatic steatosis. No liver mass. Cholecystectomy. Bile ducts are within normal post cholecystectomy limits. Common bile duct diameter 7 mm. No biliary filling defects, strictures, masses or beading. Pancreas: No pancreatic mass or duct dilation.  No pancreas divisum. Spleen: Normal size. No mass. Adrenals/Urinary Tract: Normal adrenals. No hydronephrosis. Simple appearing bilateral renal cysts, largest 5.6 cm in the anterior lower left kidney. Stomach/Bowel: Tiny hiatal hernia. Otherwise normal nondistended stomach. Visualized small and large bowel is normal caliber, with no bowel wall thickening. Vascular/Lymphatic: Normal caliber abdominal aorta. Portacaval 1.1 cm node is within normal limits for this station. No pathologically enlarged lymph nodes in the abdomen. Other: No abdominal ascites or focal fluid collection. Musculoskeletal: No aggressive appearing focal osseous lesions. IMPRESSION: 1. Bile ducts are within normal post cholecystectomy limits. No biliary filling defects to suggest choledocholithiasis. 2. Trace dependent bilateral pleural effusions. 3. Tiny hiatal hernia. Electronically Signed   By: Delbert Phenix M.D.   On: 06/05/2019 10:22   DG Abd 2 Views  Result Date: 06/04/2019 CLINICAL DATA:  Upper abdominal pain EXAM: ABDOMEN -  2 VIEW COMPARISON:  04/22/2019 FINDINGS: Scattered bowel gas is noted. No obstructive changes are seen. Mild retained fecal material is noted within the colon consistent with constipation. No free air is noted. Scattered lower pole left renal calculi are again seen and stable. Multilevel degenerative changes are noted. IMPRESSION: Mild constipation.  Nonobstructing left renal stones. No other focal abnormality is noted. Electronically Signed   By: Alcide Clever M.D.   On: 06/04/2019 09:47   US Abdomen Limited RUQ  Result Date: 06/05/2019 CLINICAL DATA:  Initial evaluation for acute right upper quadrant pain for 3 days. EXAM: ULTRASOUND ABDOMEN LIMITED RIGHT UPPER QUADRANT COMPARISON:  Prior ultrasound from 03/22/2019. FINDINGS: Gallbladder: Surgically absent. Common bile duct: Diameter: 4.6 mm Liver: No focal lesion identified. Within normal limits in parenchymal echogenicity. Portal vein is patent on color Doppler imaging with normal direction of blood flow towards the liver. Other: None. IMPRESSION: 1. Prior cholecystectomy. 2. No biliary dilatation or other acute abnormality. 3. Normal liver. Electronically Signed   By: Rise Mu M.D.   On: 06/05/2019 01:57    Scheduled Meds: . folic acid  1 mg Oral Daily  . metoprolol tartrate  25 mg Oral TID  . predniSONE  10 mg Oral Q breakfast  . tacrolimus  1 mg Oral BID  . terazosin  2 mg Oral BID  . vitamin B-12  1,000 mcg Oral Daily   Continuous Infusions:   LOS: 0 days   Rickey Barbara, MD [facility] Pager On Amion  If 7PM-7AM, please contact night-coverage 06/05/2019, 4:22 PM

## 2019-06-05 NOTE — ED Notes (Signed)
Patient transported to MRI 

## 2019-06-05 NOTE — ED Notes (Signed)
Pt taken to US in NAD

## 2019-06-05 NOTE — ED Notes (Signed)
Lunch tray ordered 

## 2019-06-05 NOTE — ED Notes (Signed)
Tele   Breakfast ordered  

## 2019-06-05 NOTE — Consult Note (Signed)
Subjective:   HPI  The patient is a 79 year old male with multiple medical problems.  We are asked to see him in regards to abdominal pain and elevated liver enzymes.  He had his gallbladder removed a few months ago and he had gallstones.  He started having epigastric and right upper quadrant abdominal pain couple of days ago and was seen by his primary physician who noted elevated liver enzymes.  He was told to come to the hospital.  His lipase is normal.  LFTs were elevated and reviewed.  It was suspected he may have a common bile duct stone and so he had a MRCP which was negative.  He is feeling better.     Past Medical History:  Diagnosis Date  . Back pain   . BPH (benign prostatic hyperplasia)   . Cancer of tonsil, palatine ([facility]) 07/12/2015  . DJD (degenerative joint disease)   . Esophageal reflux   . FHx: colonic polyps   . Hemorrhoids   . History of radiation therapy 08/28/15-- 10/16/15   Right Tonsil and bilateral neck  . Hypercholesteremia   . Hyperlipemia   . Hypertension   . Kidney cysts    [facility] [place] [facility]-2012  . Posterior vitreous detachment, left eye    DR. KATHERINE HECKER  AUGUST 2011  . Radiation 08/28/15- 10/16/15   Right Tonsil and Bilateral Neck   Past Surgical History:  Procedure Laterality Date  . CARDIAC CATHETERIZATION     remote >15 years ago  . CHOLECYSTECTOMY N/A 03/23/2019   Procedure: LAPAROSCOPIC CHOLECYSTECTOMY;  Surgeon: Coralie Keens, MD;  Location: [facility];  Service: General;  Laterality: N/A;  . IR GASTROSTOMY TUBE REMOVAL  02/24/2017  . IR GENERIC HISTORICAL  10/11/2015   IR PATIENT EVAL TECH 0-60 MINS 10/11/2015 Darrell K Allred, PA-C [facility]-INTERV RAD  . IR PATIENT EVAL TECH 0-60 MINS  07/05/2016  . IR PATIENT EVAL TECH 0-60 MINS  08/14/2016  . IR PATIENT EVAL TECH 0-60 MINS  12/09/2016  . IR REMOVAL TUN ACCESS W/ PORT W/O FL MOD SED  07/22/2016  . VASECTOMY  1975   Social History   Socioeconomic History  . Marital status: Married     Spouse name: Not on file  . Number of children: Not on file  . Years of education: Not on file  . Highest education level: Not on file  Occupational History  . Not on file  Tobacco Use  . Smoking status: Former Research scientist (life sciences)  . Smokeless tobacco: Never Used  . Tobacco comment: quit 35 years ago  Substance and Sexual Activity  . Alcohol use: No    Alcohol/week: 0.0 standard drinks  . Drug use: No  . Sexual activity: Not on file  Other Topics Concern  . Not on file  Social History Narrative   TOBACCO USE CIGARETTES: NEVER SMOKED.NO SMOKING.NO ALCOHOL .CAFFEINE YES:NO  RECREATIONAL DRUGS. OCCUPATION :RETIRED   MARTIAL STATUS : MARRIED    Social Determinants of Health   Financial Resource Strain:   . Difficulty of Paying Living Expenses:   Food Insecurity:   . Worried About Charity fundraiser in the Last Year:   . Arboriculturist in the Last Year:   Transportation Needs: No Transportation Needs  . Lack of Transportation (Medical): No  . Lack of Transportation (Non-Medical): No  Physical Activity:   . Days of Exercise per Week:   . Minutes of Exercise per Session:   Stress:   . Feeling  of Stress :   Social Connections:   . Frequency of Communication with Friends and Family:   . Frequency of Social Gatherings with Friends and Family:   . Attends Religious Services:   . Active Member of Clubs or Organizations:   . Attends Archivist Meetings:   Marland Kitchen Marital Status:   Intimate Partner Violence:   . Fear of Current or Ex-Partner:   . Emotionally Abused:   Marland Kitchen Physically Abused:   . Sexually Abused:    family history includes Hypertension in his mother.  Current Facility-Administered Medications:  .  acetaminophen (TYLENOL) tablet 650 mg, 650 mg, Oral, Q6H PRN **OR** acetaminophen (TYLENOL) suppository 650 mg, 650 mg, Rectal, Q6H PRN, Rise Patience, MD .  folic acid (FOLVITE) tablet 1 mg, 1 mg, Oral, Daily, Rise Patience, MD, 1 mg at 06/05/19 1026 .   metoprolol tartrate (LOPRESSOR) tablet 25 mg, 25 mg, Oral, TID, Rise Patience, MD, 25 mg at 06/05/19 1027 .  ondansetron (ZOFRAN) tablet 4 mg, 4 mg, Oral, Q6H PRN **OR** ondansetron (ZOFRAN) injection 4 mg, 4 mg, Intravenous, Q6H PRN, Rise Patience, MD .  predniSONE (DELTASONE) tablet 10 mg, 10 mg, Oral, Q breakfast, Rise Patience, MD, 10 mg at 06/05/19 0901 .  tacrolimus (PROGRAF) capsule 1 mg, 1 mg, Oral, BID, Rise Patience, MD, 1 mg at 06/05/19 1134 .  terazosin (HYTRIN) capsule 2 mg, 2 mg, Oral, BID, Rise Patience, MD, 2 mg at 06/05/19 1134 .  vitamin B-12 (CYANOCOBALAMIN) tablet 1,000 mcg, 1,000 mcg, Oral, Daily, Rise Patience, MD, 1,000 mcg at 06/05/19 1026  Current Outpatient Medications:  .  aspirin EC 81 MG tablet, Take 81 mg by mouth daily., Disp: , Rfl:  .  chlorhexidine (PERIDEX) 0.12 % solution, Use as directed 15 mLs in the mouth or throat 2 (two) times daily. Mouth rinse, Disp: , Rfl:  .  Cholecalciferol (VITAMIN D) 50 MCG (2000 UT) tablet, Take 2,000 Units by mouth daily., Disp: , Rfl:  .  diphenhydrAMINE (BENADRYL) 25 MG tablet, Take 25 mg by mouth at bedtime., Disp: , Rfl:  .  folic acid (FOLVITE) 1 MG tablet, Take 1 mg by mouth daily. , Disp: , Rfl:  .  metoprolol tartrate (LOPRESSOR) 25 MG tablet, Take 1 tablet (25 mg total) by mouth 3 (three) times daily., Disp: 60 tablet, Rfl: 1 .  oxyCODONE (OXY IR/ROXICODONE) 5 MG immediate release tablet, Take 1 tablet (5 mg total) by mouth every 6 (six) hours as needed for moderate pain., Disp: 20 tablet, Rfl: 0 .  polyethylene glycol (MIRALAX / GLYCOLAX) 17 g packet, Take 17 g by mouth daily as needed for mild constipation., Disp: 14 each, Rfl: 0 .  predniSONE (DELTASONE) 10 MG tablet, Take 10 mg by mouth daily with breakfast., Disp: , Rfl:  .  sennosides-docusate sodium (SENOKOT-S) 8.6-50 MG tablet, Take 1 tablet by mouth daily as needed for constipation., Disp: , Rfl:  .  tacrolimus (PROGRAF) 1  MG capsule, Take 1 mg by mouth 2 (two) times daily., Disp: , Rfl:  .  terazosin (HYTRIN) 2 MG capsule, Take 2 mg by mouth 2 (two) times daily. , Disp: , Rfl:  .  vitamin B-12 (CYANOCOBALAMIN) 1000 MCG tablet, Take 1,000 mcg by mouth daily. Chewable, Disp: , Rfl:  Allergies  Allergen Reactions  . Lisinopril Cough  . Acyclovir And Related Other (See Comments)    Fever, chills   . Famciclovir Other (See Comments)  Fever, chills     Objective:     BP (!) 150/81   Pulse (!) 59   Temp 97.9 F (36.6 C) (Oral)   Resp 12   Ht 6' (1.829 m)   Wt 90.7 kg   SpO2 96%   BMI 27.12 kg/m   He is in no distress  Heart regular rhythm  Lungs clear  Abdomen nontender  Laboratory No components found for: D1    Assessment:     Elevated liver enzymes.  I suspect he passed a common bile duct stone      Plan:     Would recommend observation at this time.  Given a negative MRCP I don't think we need to proceed with ERCP especially if he is getting better.  Would follow LFTs.  His hepatitis panel was negative.

## 2019-06-05 NOTE — H&P (Signed)
History and Physical    Randy Hanson:[id number] DOB: 08-04-1940 DOA: 06/04/2019  PCP: [name], MD  Patient coming from: Home.  Chief Complaint: Abdominal pain.  HPI: Randy Hanson is a 79 y.o. male with history of hypertension lichen planus on immunosuppressants history of tonsillar cancer status post radiation in remission started experiencing epigastric abdominal pain 2 days ago which lasted for 2 hours and completely resolved after patient had one episode of nausea vomiting.  Had gone to his primary care physician yesterday and had labs done which showed elevated LFTs with total bilirubin of 5.2.  Per patient patient also had ultrasound done results of which are not available to [place] and there was some concern for obstructing CBD stone.  Patient was referred to the ER.  Patient otherwise denies any chest pain shortness of breath diarrhea or any fever chills.  ED Course: In the ER patient was afebrile not hypoxic and abdomen appears benign on exam.  Ultrasound of the abdomen does not show anything acute.  LFTs show AST of 71 ALT 163 total bilirubin 2.4 CBC unremarkable creatinine 1.2 Covid test was negative.  Given the elevated LFTs and recent symptoms will admit for further observation and get MRCP.  Review of Systems: As per HPI, rest all negative.   Past Medical History:  Diagnosis Date  . Back pain   . BPH (benign prostatic hyperplasia)   . 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 DJD (degenerative joint disease)   . Esophageal reflux   . FHx: colonic polyps   . Hemorrhoids   . History of radiation therapy 08/28/15-- 10/16/15   Right Tonsil and bilateral neck  . Hypercholesteremia   . Hyperlipemia   . Hypertension   . Kidney cysts    [facility] [place] [facility]-2012  . Posterior vitreous detachment, left eye    DR. KATHERINE HECKER  AUGUST 2011  . Radiation 08/28/15- 10/16/15   Right Tonsil and Bilateral Neck    Past Surgical History:  Procedure  Laterality Date  . CARDIAC CATHETERIZATION     remote >15 years ago  . CHOLECYSTECTOMY N/A 03/23/2019   Procedure: LAPAROSCOPIC CHOLECYSTECTOMY;  Surgeon: Abigail Miyamoto, MD;  Location: [facility] OR;  Service: General;  Laterality: N/A;  . IR GASTROSTOMY TUBE REMOVAL  02/24/2017  . IR GENERIC HISTORICAL  10/11/2015   IR PATIENT EVAL TECH 0-60 MINS 10/11/2015 Darrell K Allred, PA-C [facility]-INTERV RAD  . IR PATIENT EVAL TECH 0-60 MINS  07/05/2016  . IR PATIENT EVAL TECH 0-60 MINS  08/14/2016  . IR PATIENT EVAL TECH 0-60 MINS  12/09/2016  . IR REMOVAL TUN ACCESS W/ PORT W/O FL MOD SED  07/22/2016  . VASECTOMY  1975     reports that he has quit smoking. He has never used smokeless tobacco. He reports that he does not drink alcohol or use drugs.  Allergies  Allergen Reactions  . Lisinopril Cough  . Acyclovir And Related Other (See Comments)    Fever, chills   . Famciclovir Other (See Comments)    Fever, chills    Family History  Problem Relation Age of Onset  . Hypertension Mother     Prior to Admission medications   Medication Sig Start Date End Date Taking? Authorizing Provider  aspirin EC 81 MG tablet Take 81 mg by mouth daily.   Yes [provider]  chlorhexidine (PERIDEX) 0.12 % solution Use as directed 15 mLs in the mouth or throat 2 (two) times  daily. Mouth rinse   Yes [provider]  Cholecalciferol (VITAMIN D) 50 MCG (2000 UT) tablet Take 2,000 Units by mouth daily.   Yes [provider]  diphenhydrAMINE (BENADRYL) 25 MG tablet Take 25 mg by mouth at bedtime.   Yes [provider]  folic acid (FOLVITE) 1 MG tablet Take 1 mg by mouth daily.    Yes [provider]  metoprolol tartrate (LOPRESSOR) 25 MG tablet Take 1 tablet (25 mg total) by mouth 3 (three) times daily. 03/25/19  Yes Focht, Jessica L, PA  oxyCODONE (OXY IR/ROXICODONE) 5 MG immediate release tablet Take 1 tablet (5 mg total) by mouth every 6 (six) hours as needed for moderate pain.  03/25/19  Yes Focht, Jessica L, PA  polyethylene glycol (MIRALAX / GLYCOLAX) 17 g packet Take 17 g by mouth daily as needed for mild constipation. 03/25/19  Yes Focht, Jessica L, PA  predniSONE (DELTASONE) 10 MG tablet Take 10 mg by mouth daily with breakfast.   Yes [provider]  sennosides-docusate sodium (SENOKOT-S) 8.6-50 MG tablet Take 1 tablet by mouth daily as needed for constipation.   Yes [provider]  tacrolimus (PROGRAF) 1 MG capsule Take 1 mg by mouth 2 (two) times daily.   Yes [provider]  terazosin (HYTRIN) 2 MG capsule Take 2 mg by mouth 2 (two) times daily.    Yes [provider]  vitamin B-12 (CYANOCOBALAMIN) 1000 MCG tablet Take 1,000 mcg by mouth daily. Chewable   Yes [provider]    Physical Exam: Constitutional: Moderately built and nourished. Vitals:   06/05/19 0330 06/05/19 0345 06/05/19 0400 06/05/19 0415  BP: (!) 177/101 (!) 171/81 (!) 150/95 (!) 180/72  Pulse: 69 97 63 66  Resp: 15 11 16 18   Temp:      TempSrc:      SpO2: 97% 94% 94% 93%  Weight:      Height:       Eyes: Anicteric no pallor. ENMT: No discharge from the ears eyes nose or mouth. Neck: No mass or.  No neck rigidity. Respiratory: No rhonchi or crepitations. Cardiovascular: S1-S2 heard. Abdomen: Soft nontender bowel sounds present. Musculoskeletal: No edema. Skin: No rash. Neurologic: Alert awake oriented to time place and person.  Moves all extremities. Psychiatric: Appears normal per normal affect.   Labs on Admission: I have personally reviewed following labs and imaging studies  CBC: Recent Labs  Lab 06/04/19 2345  WBC 6.7  NEUTROABS 5.6  HGB 14.3  HCT 42.4  MCV 97.7  PLT 205   Basic Metabolic Panel: Recent Labs  Lab 06/04/19 2345  NA 140  K 3.7  CL 104  CO2 22  GLUCOSE 105*  BUN 17  CREATININE 1.25*  CALCIUM 9.3   GFR: Estimated Creatinine Clearance: 55.2 mL/min (A) (by C-G formula based on SCr of 1.25 mg/dL  (H)). Liver Function Tests: Recent Labs  Lab 06/04/19 2345  AST 71*  ALT 163*  ALKPHOS 139*  BILITOT 2.4*  PROT 6.2*  ALBUMIN 3.4*   Recent Labs  Lab 06/04/19 2345  LIPASE 25   No results for input(s): AMMONIA in the last 168 hours. Coagulation Profile: Recent Labs  Lab 06/04/19 2345  INR 1.0   Cardiac Enzymes: No results for input(s): CKTOTAL, CKMB, CKMBINDEX, TROPONINI in the last 168 hours. BNP (last 3 results) No results for input(s): PROBNP in the last 8760 hours. HbA1C: No results for input(s): HGBA1C in the last 72 hours. CBG: No results for  input(s): GLUCAP in the last 168 hours. Lipid Profile: No results for input(s): CHOL, HDL, LDLCALC, TRIG, CHOLHDL, LDLDIRECT in the last 72 hours. Thyroid Function Tests: No results for input(s): TSH, T4TOTAL, FREET4, T3FREE, THYROIDAB in the last 72 hours. Anemia Panel: No results for input(s): VITAMINB12, FOLATE, FERRITIN, TIBC, IRON, RETICCTPCT in the last 72 hours. Urine analysis:    Component Value Date/Time   COLORURINE YELLOW 06/04/2019 2326   APPEARANCEUR CLEAR 06/04/2019 2326   LABSPEC 1.010 06/04/2019 2326   PHURINE 5.0 06/04/2019 2326   GLUCOSEU NEGATIVE 06/04/2019 2326   HGBUR SMALL (A) 06/04/2019 2326   BILIRUBINUR NEGATIVE 06/04/2019 2326   KETONESUR 5 (A) 06/04/2019 2326   PROTEINUR NEGATIVE 06/04/2019 2326   NITRITE NEGATIVE 06/04/2019 2326   LEUKOCYTESUR NEGATIVE 06/04/2019 2326   Sepsis Labs: @LABRCNTIP (procalcitonin:4,lacticidven:4) ) Recent Results (from the past 240 hour(s))  SARS CORONAVIRUS 2 (TAT 6-24 HRS) Nasopharyngeal Nasopharyngeal Swab     Status: None   Collection Time: 06/04/19 11:56 PM   Specimen: Nasopharyngeal Swab  Result Value Ref Range Status   SARS Coronavirus 2 NEGATIVE NEGATIVE Final    Comment: (NOTE) SARS-CoV-2 target nucleic acids are NOT DETECTED. The SARS-CoV-2 RNA is generally detectable in upper and lower respiratory specimens during the acute phase of  infection. Negative results do not preclude SARS-CoV-2 infection, do not rule out co-infections with other pathogens, and should not be used as the sole basis for treatment or other patient management decisions. Negative results must be combined with clinical observations, patient history, and epidemiological information. The expected result is Negative. Fact Sheet for Patients: HairSlick.no Fact Sheet for Healthcare Providers: quierodirigir.com This test is not yet approved or cleared by the [place] FDA and  has been authorized for detection and/or diagnosis of SARS-CoV-2 by FDA under an Emergency Use Authorization (EUA). This EUA will remain  in effect (meaning this test can be used) for the duration of the COVID-19 declaration under Section 56 4(b)(1) of the Act, 21 U.S.C. section 360bbb-3(b)(1), unless the authorization is terminated or revoked sooner. Performed at [facility], [address], [place], [place] [address]      Radiological Exams on Admission: DG Abd 2 Views  Result Date: 06/04/2019 CLINICAL DATA:  Upper abdominal pain EXAM: ABDOMEN - 2 VIEW COMPARISON:  04/22/2019 FINDINGS: Scattered bowel gas is noted. No obstructive changes are seen. Mild retained fecal material is noted within the colon consistent with constipation. No free air is noted. Scattered lower pole left renal calculi are again seen and stable. Multilevel degenerative changes are noted. IMPRESSION: Mild constipation. Nonobstructing left renal stones. No other focal abnormality is noted. Electronically Signed   By: Alcide Clever M.D.   On: 06/04/2019 09:47   US Abdomen Limited RUQ  Result Date: 06/05/2019 CLINICAL DATA:  Initial evaluation for acute right upper quadrant pain for 3 days. EXAM: ULTRASOUND ABDOMEN LIMITED RIGHT UPPER QUADRANT COMPARISON:  Prior ultrasound from 03/22/2019. FINDINGS: Gallbladder: Surgically absent. Common bile  duct: Diameter: 4.6 mm Liver: No focal lesion identified. Within normal limits in parenchymal echogenicity. Portal vein is patent on color Doppler imaging with normal direction of blood flow towards the liver. Other: None. IMPRESSION: 1. Prior cholecystectomy. 2. No biliary dilatation or other acute abnormality. 3. Normal liver. Electronically Signed   By: Rise Mu M.D.   On: 06/05/2019 01:57    EKG: Independently reviewed.  Normal sinus rhythm with APCs.  Assessment/Plan Principal Problem:   Abdominal pain Active Problems:   Essential hypertension  Cancer of tonsil, palatine (HCC)   Elevated LFTs    1. Abdominal pain with elevated LFTs concerning for CBD stones ultrasound unremarkable.  Will get MRCP.  Further plans based on the MRCP results. 2. History of hypertension and NSVT with normal LVEF on metoprolol which will be continued. 3. History of lichen planus on immunosuppressants prednisone tacrolimus. 4. History of tonsillar cancer in remission status post radiation. 5. Chronic kidney disease stage II creatinine appears to be at baseline.   DVT prophylaxis: SCDs.  Will avoid anticoagulation until we get MRCP results. Code Status: Full code. Family Communication: Discussed with patient. Disposition Plan: Home. Consults called: None. Admission status: Observation.   Eduard Clos MD [facility] Pager [phone].  If 7PM-7AM, please contact night-coverage [email or web address] Password [facility]  06/05/2019, 5:08 AM

## 2019-06-06 DIAGNOSIS — R001 Bradycardia, unspecified: Secondary | ICD-10-CM

## 2019-06-06 DIAGNOSIS — C099 Malignant neoplasm of tonsil, unspecified: Secondary | ICD-10-CM

## 2019-06-06 LAB — COMPREHENSIVE METABOLIC PANEL
ALT: 110 U/L — ABNORMAL HIGH (ref 0–44)
AST: 39 U/L (ref 15–41)
Albumin: 3 g/dL — ABNORMAL LOW (ref 3.5–5.0)
Alkaline Phosphatase: 126 U/L (ref 38–126)
Anion gap: 9 (ref 5–15)
BUN: 13 mg/dL (ref 8–23)
CO2: 24 mmol/L (ref 22–32)
Calcium: 9.5 mg/dL (ref 8.9–10.3)
Chloride: 106 mmol/L (ref 98–111)
Creatinine, Ser: 0.97 mg/dL (ref 0.61–1.24)
GFR calc Af Amer: >60 mL/min (ref >60)
GFR calc non Af Amer: >60 mL/min (ref >60)
Glucose, Bld: 102 mg/dL — ABNORMAL HIGH (ref 70–99)
Potassium: 3.8 mmol/L (ref 3.5–5.1)
Sodium: 139 mmol/L (ref 135–145)
Total Bilirubin: 1.7 mg/dL — ABNORMAL HIGH (ref 0.3–1.2)
Total Protein: 5.9 g/dL — ABNORMAL LOW (ref 6.5–8.1)

## 2019-06-06 LAB — MAGNESIUM: Magnesium: 1.9 mg/dL (ref 1.7–2.4)

## 2019-06-06 NOTE — Progress Notes (Signed)
The patient is doing well today.  He has no abdominal pain.  His LFTs are improving.  His MRCP was negative for common bile duct stone.  I think he probably passed a stone.  I do not think anything further is required at this time from a GI standpoint.  We will sign off.

## 2019-06-06 NOTE — Plan of Care (Signed)
?  Problem: Clinical Measurements: ?Goal: Diagnostic test results will improve ?Outcome: Progressing ?  ?Problem: Safety: ?Goal: Ability to remain free from injury will improve ?Outcome: Progressing ?  ?

## 2019-06-06 NOTE — Consult Note (Signed)
Cardiology Consultation:   Patient ID: Randy Hanson; [id number]; 1940-10-20   Admit date: 06/04/2019 Date of Consult: 06/06/2019  Primary Care Provider: [name], MD Primary Cardiologist: Charlton Haws, MD Primary Electrophysiologist:  Lewayne Bunting, MD    Patient Profile:   Randy Hanson is a 79 y.o. male with a PMH of paroxysmal atrial fibrillation not on anticoagulation (isolated occurrence in the setting of sepsis), NSVT, HTN, HLD, lichen planus on immunosuppressants, tonsillar cancer in remission s/p XRT and chemotherapy,  who is being seen today for the evaluation of bradycardia at the request of Dr. Rhona Leavens.  History of Present Illness:   [name] was in his usual state of health until 06/03/19 when he began experiencing epigastric abdominal pain with associated N/V. He was seen by his PCP who obtained LFTs which were elevated and an ultrasound which was reportedly c/f obstructing CBD stone, therefore patient was sent to the ED for further evaluation. MRCP this admission, however was negative for obstruction and GI did not recommend any further testing at this time. LFTs have continued to downtrend. Patient was noted to have intermittent bradycardia and 2nd degree AV block on telemetry this admission, for which cardiology was consulted.  He was last evaluated by cardiology at an outpatient visit with Dr. Ladona Ridgel to establish EP care. He had a recent cardiac monitor which was concerning for tachyarrhythmia, deemed to be NSVT without evidence of recurrent atrial fibrillation, therefore anticoagulation was not recommended. The event monitor was ordered to evaluate a recent episode of atrial fibrillation with RVR in the setting of acute cholecystitis. He was recommended to continue BBlocker therapy for management of NSVT. His last ischemic evaluation was a NST in 2016 which was reported to be low risk. Echo 03/2019 showed EF 65-70%  At the time of this evaluation he reports feeling back to  baseline. He has no complaints of dizziness, lightheadedness, or syncope either this admission or in recent weeks. He denies recent fatigue, daytime somnolence, snoring, PND, orthopnea, or LE edema. He reports chronic DOE which he attributes to his age and is unchanged in recent months. He reports occasional exertional chest pain with his last episode occurring 03/2019 while working in his yard which lasted ~20 minutes and resolved with rest. This occurs at most once a month.   Past Medical History:  Diagnosis Date  . Back pain   . BPH (benign prostatic hyperplasia)   . 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 DJD (degenerative joint disease)   . Esophageal reflux   . FHx: colonic polyps   . Hemorrhoids   . History of radiation therapy 08/28/15-- 10/16/15   Right Tonsil and bilateral neck  . Hypercholesteremia   . Hyperlipemia   . Hypertension   . Kidney cysts    [facility] [place] [facility]  . Posterior vitreous detachment, left eye    DR. KATHERINE HECKER  AUGUST 2011  . Radiation 08/28/15- 10/16/15   Right Tonsil and Bilateral Neck    Past Surgical History:  Procedure Laterality Date  . CARDIAC CATHETERIZATION     remote >15 years ago  . CHOLECYSTECTOMY N/A 03/23/2019   Procedure: LAPAROSCOPIC CHOLECYSTECTOMY;  Surgeon: Abigail Miyamoto, MD;  Location: [facility] OR;  Service: General;  Laterality: N/A;  . IR GASTROSTOMY TUBE REMOVAL  02/24/2017  . IR GENERIC HISTORICAL  10/11/2015   IR PATIENT EVAL TECH 0-60 MINS 10/11/2015 Darrell K Allred, PA-C [facility]-INTERV RAD  . IR PATIENT EVAL TECH 0-60 MINS  07/05/2016  . IR PATIENT EVAL TECH 0-60 MINS  08/14/2016  . IR PATIENT EVAL TECH 0-60 MINS  12/09/2016  . IR REMOVAL TUN ACCESS W/ PORT W/O FL MOD SED  07/22/2016  . VASECTOMY  1975     Home Medications:  Prior to Admission medications   Medication Sig Start Date End Date Taking? Authorizing Provider  aspirin EC 81 MG tablet Take 81 mg by mouth daily.   Yes [provider]   chlorhexidine (PERIDEX) 0.12 % solution Use as directed 15 mLs in the mouth or throat 2 (two) times daily. Mouth rinse   Yes [provider]  Cholecalciferol (VITAMIN D) 50 MCG (2000 UT) tablet Take 2,000 Units by mouth daily.   Yes [provider]  diphenhydrAMINE (BENADRYL) 25 MG tablet Take 25 mg by mouth at bedtime.   Yes [provider]  folic acid (FOLVITE) 1 MG tablet Take 1 mg by mouth daily.    Yes [provider]  metoprolol tartrate (LOPRESSOR) 25 MG tablet Take 1 tablet (25 mg total) by mouth 3 (three) times daily. 03/25/19  Yes Focht, Jessica L, PA  oxyCODONE (OXY IR/ROXICODONE) 5 MG immediate release tablet Take 1 tablet (5 mg total) by mouth every 6 (six) hours as needed for moderate pain. 03/25/19  Yes Focht, Jessica L, PA  polyethylene glycol (MIRALAX / GLYCOLAX) 17 g packet Take 17 g by mouth daily as needed for mild constipation. 03/25/19  Yes Focht, Jessica L, PA  predniSONE (DELTASONE) 10 MG tablet Take 10 mg by mouth daily with breakfast.   Yes [provider]  sennosides-docusate sodium (SENOKOT-S) 8.6-50 MG tablet Take 1 tablet by mouth daily as needed for constipation.   Yes [provider]  tacrolimus (PROGRAF) 1 MG capsule Take 1 mg by mouth 2 (two) times daily.   Yes [provider]  terazosin (HYTRIN) 2 MG capsule Take 2 mg by mouth 2 (two) times daily.    Yes [provider]  vitamin B-12 (CYANOCOBALAMIN) 1000 MCG tablet Take 1,000 mcg by mouth daily. Chewable   Yes [provider]    Inpatient Medications: Scheduled Meds: . folic acid  1 mg Oral Daily  . predniSONE  10 mg Oral Q breakfast  . tacrolimus  1 mg Oral BID  . terazosin  2 mg Oral BID  . vitamin B-12  1,000 mcg Oral Daily   Continuous Infusions:  PRN Meds: acetaminophen **OR** acetaminophen, alum & mag hydroxide-simeth, ondansetron **OR** ondansetron (ZOFRAN) IV  Allergies:    Allergies  Allergen Reactions  .  Lisinopril Cough  . Acyclovir And Related Other (See Comments)    Fever, chills   . Famciclovir Other (See Comments)    Fever, chills    Social History:   Social History   Socioeconomic History  . Marital status: Married    Spouse name: Not on file  . Number of children: Not on file  . Years of education: Not on file  . Highest education level: Not on file  Occupational History  . Not on file  Tobacco Use  . Smoking status: Former Games developer  . Smokeless tobacco: Never Used  . Tobacco comment: quit 35 years ago  Substance and Sexual Activity  . Alcohol use: No    Alcohol/week: 0.0 standard drinks  . Drug use: No  . Sexual activity: Not on file  Other Topics Concern  . Not on file  Social History Narrative   TOBACCO USE CIGARETTES: NEVER SMOKED.NO SMOKING.NO ALCOHOL .  CAFFEINE YES:NO  RECREATIONAL DRUGS. OCCUPATION :RETIRED   MARTIAL STATUS : MARRIED    Social Determinants of Health   Financial Resource Strain:   . Difficulty of Paying Living Expenses:   Food Insecurity:   . Worried About Programme researcher, broadcasting/film/video in the Last Year:   . Barista in the Last Year:   Transportation Needs: No Transportation Needs  . Lack of Transportation (Medical): No  . Lack of Transportation (Non-Medical): No  Physical Activity:   . Days of Exercise per Week:   . Minutes of Exercise per Session:   Stress:   . Feeling of Stress :   Social Connections:   . Frequency of Communication with Friends and Family:   . Frequency of Social Gatherings with Friends and Family:   . Attends Religious Services:   . Active Member of Clubs or Organizations:   . Attends Banker Meetings:   Marland Kitchen Marital Status:   Intimate Partner Violence:   . Fear of Current or Ex-Partner:   . Emotionally Abused:   Marland Kitchen Physically Abused:   . Sexually Abused:     Family History:    Family History  Problem Relation Age of Onset  . Hypertension Mother      ROS:  Please see the history of present  illness.  ROS  All other ROS reviewed and negative.     Physical Exam/Data:   Vitals:   06/06/19 0206 06/06/19 0558 06/06/19 0917 06/06/19 0919  BP: 135/83 (!) 155/94  (!) 147/78  Pulse: (!) 58 (!) 56 62 60  Resp: 17 14  18   Temp: 97.9 F (36.6 C) 97.7 F (36.5 C)  97.7 F (36.5 C)  TempSrc: Oral Oral  Oral  SpO2: 94% 98%  96%  Weight:  91 kg    Height:        Intake/Output Summary (Last 24 hours) at 06/06/2019 1101 Last data filed at 06/06/2019 0900 Gross per 24 hour  Intake 360 ml  Output -  Net 360 ml   Filed Weights   06/04/19 1548 06/06/19 0558  Weight: 90.7 kg 91 kg   Body mass index is 27.21 kg/m.  General:  Well nourished, well developed, in no acute distress HEENT: sclera anicteric  Neck: no JVD Vascular: No carotid bruits; distal pulses 2+ bilaterally Cardiac:  normal S1, S2; RRR; no murmurs, rubs, or gallops Lungs:  clear to auscultation bilaterally, no wheezing, rhonchi or rales  Abd: NABS, soft, obese, nontender, no hepatomegaly Ext: no edema Musculoskeletal:  No deformities, BUE and BLE strength normal and equal Skin: warm and dry  Neuro:  CNs 2-12 intact, no focal abnormalities noted Psych:  Normal affect   EKG:  The EKG was personally reviewed and demonstrates:  Sinus rhythm with rate 68 bpm, PAC/blocked PACs, early repol changes, no TWI, significant baseline wander limits ischemic interpretation, though no significant STE/D observed. Telemetry:  Telemetry was personally reviewed and demonstrates:  Sinus rhythm with occasional PACs/PVCs with evidence of 2nd degree AV block type 1 and 2; no evidence of 3rd degree block.   Relevant CV Studies: ECHO IMPRESSIONS 03/2019  1. Left ventricular ejection fraction, by visual estimation, is 65 to 70%. The left ventricle has hyperdynamic function. There is no left ventricular hypertrophy. 2. Left ventricular diastolic parameters are consistent with Grade I diastolic dysfunction (impaired relaxation). 3.  The left ventricle has no regional wall motion abnormalities. 4. Global right ventricle has normal systolic function.The right ventricular size is normal.  No increase in right ventricular wall thickness. 5. Left atrial size was normal. 6. Right atrial size was normal. 7. Mild to moderate mitral annular calcification. 8. The mitral valve is normal in structure. No evidence of mitral valve regurgitation. No evidence of mitral stenosis. 9. The tricuspid valve is normal in structure. 10. The aortic valve is normal in structure. Aortic valve regurgitation is not visualized. Mild aortic valve sclerosis without stenosis. 11. The pulmonic valve was normal in structure. Pulmonic valve regurgitation is trivial. 12. The inferior vena cava is normal in size with greater than 50% respiratory variability, suggesting right atrial pressure of 3 mmHg.  Laboratory Data:  Chemistry Recent Labs  Lab 06/04/19 2345 06/06/19 0441  NA 140 139  K 3.7 3.8  CL 104 106  CO2 22 24  GLUCOSE 105* 102*  BUN 17 13  CREATININE 1.25* 0.97  CALCIUM 9.3 9.5  GFRNONAA 56* >60  GFRAA >60 >60  ANIONGAP 14 9    Recent Labs  Lab 06/04/19 2345 06/06/19 0441  PROT 6.2* 5.9*  ALBUMIN 3.4* 3.0*  AST 71* 39  ALT 163* 110*  ALKPHOS 139* 126  BILITOT 2.4* 1.7*   Hematology Recent Labs  Lab 06/04/19 2345  WBC 6.7  RBC 4.34  HGB 14.3  HCT 42.4  MCV 97.7  MCH 32.9  MCHC 33.7  RDW 14.2  PLT 205   Cardiac EnzymesNo results for input(s): TROPONINI in the last 168 hours. No results for input(s): TROPIPOC in the last 168 hours.  BNPNo results for input(s): BNP, PROBNP in the last 168 hours.  DDimer No results for input(s): DDIMER in the last 168 hours.  Radiology/Studies:  MR ABDOMEN MRCP WO CONTRAST  Result Date: 06/05/2019 CLINICAL DATA:  Right upper quadrant and epigastric abdominal pain. Elevated liver function tests. History of cholecystectomy and tonsillar cancer. EXAM: MRI ABDOMEN WITHOUT CONTRAST   (INCLUDING MRCP) TECHNIQUE: Multiplanar multisequence MR imaging of the abdomen was performed. Heavily T2-weighted images of the biliary and pancreatic ducts were obtained, and three-dimensional MRCP images were rendered by post processing. COMPARISON:  06/05/2019 abdominal sonogram. 07/04/2016 PET-CT. FINDINGS: Lower chest: Trace dependent bilateral pleural effusions. Hepatobiliary: Normal liver size and configuration. No hepatic steatosis. No liver mass. Cholecystectomy. Bile ducts are within normal post cholecystectomy limits. Common bile duct diameter 7 mm. No biliary filling defects, strictures, masses or beading. Pancreas: No pancreatic mass or duct dilation.  No pancreas divisum. Spleen: Normal size. No mass. Adrenals/Urinary Tract: Normal adrenals. No hydronephrosis. Simple appearing bilateral renal cysts, largest 5.6 cm in the anterior lower left kidney. Stomach/Bowel: Tiny hiatal hernia. Otherwise normal nondistended stomach. Visualized small and large bowel is normal caliber, with no bowel wall thickening. Vascular/Lymphatic: Normal caliber abdominal aorta. Portacaval 1.1 cm node is within normal limits for this station. No pathologically enlarged lymph nodes in the abdomen. Other: No abdominal ascites or focal fluid collection. Musculoskeletal: No aggressive appearing focal osseous lesions. IMPRESSION: 1. Bile ducts are within normal post cholecystectomy limits. No biliary filling defects to suggest choledocholithiasis. 2. Trace dependent bilateral pleural effusions. 3. Tiny hiatal hernia. Electronically Signed   By: Delbert Phenix M.D.   On: 06/05/2019 10:22   DG Abd 2 Views  Result Date: 06/04/2019 CLINICAL DATA:  Upper abdominal pain EXAM: ABDOMEN - 2 VIEW COMPARISON:  04/22/2019 FINDINGS: Scattered bowel gas is noted. No obstructive changes are seen. Mild retained fecal material is noted within the colon consistent with constipation. No free air is noted. Scattered lower pole left renal calculi are  again seen and stable. Multilevel degenerative changes are noted. IMPRESSION: Mild constipation. Nonobstructing left renal stones. No other focal abnormality is noted. Electronically Signed   By: Alcide Clever M.D.   On: 06/04/2019 09:47   US Abdomen Limited RUQ  Result Date: 06/05/2019 CLINICAL DATA:  Initial evaluation for acute right upper quadrant pain for 3 days. EXAM: ULTRASOUND ABDOMEN LIMITED RIGHT UPPER QUADRANT COMPARISON:  Prior ultrasound from 03/22/2019. FINDINGS: Gallbladder: Surgically absent. Common bile duct: Diameter: 4.6 mm Liver: No focal lesion identified. Within normal limits in parenchymal echogenicity. Portal vein is patent on color Doppler imaging with normal direction of blood flow towards the liver. Other: None. IMPRESSION: 1. Prior cholecystectomy. 2. No biliary dilatation or other acute abnormality. 3. Normal liver. Electronically Signed   By: Rise Mu M.D.   On: 06/05/2019 01:57    Assessment and Plan:   1. 2nd degree AV block: patient presented with evidence of passed CBD stone with recent abdominal pain and elevated LFTs. He reports that he has not been taking metoprolol since mid-February. He had a recent cardiac monitor which showed occasional NSVT without evidence of high-grade AV block per Dr. Ladona Ridgel; formal read not in epic yet; notably patient was not on BBlocker at that time. HR's predominately in the 50s-60s this admission with dips to 40s on telemetry with evidence of 2nd degree AV block. Patient has been asymptomatic. AV block likely vagal mediated in the setting of recent CBD stone, and also with re-introduction of BBlocker this admission  - Favor discontinuing metoprolol at this time.   2. Paroxysmal atrial fibrillation: occurred in the setting of sepsis. Reportedly no reoccurrence on outpatient cardiac monitor per Dr. Ladona Ridgel. Anticoagulation not started given isolated episode - Consider addition of eliquis for stroke ppx should Afib reoccur  3.  HTN: BP has been elevated this admission. - Consider addition of antihypertensive medications if BP elevated after recovery of present illness  4. Chest pain: patient reports occasional exertional chest pain with last episode occurring 03/2019. Risk factors for CAD include HTN and unmanaged HLD.  - Consider updating stress test outpatient.  - Continue aspirin  5. HLD: no recent lipids. He reports intolerance to statins (has tried multiple), even with supplemental CoQ10.  - Will defer monitoring to outpatient - Could consider referral to the lipid clinic given intolerance if LDL significantly elevated.   Patient is scheduled to see Dr. Eden Emms 06/10/19 for close follow-up.   For questions or updates, please contact [facility] Please consult [email or web address] for contact info under Cardiology/STEMI.   Signed, Beatriz Stallion, PA-C  06/06/2019 11:01 AM [phone]

## 2019-06-06 NOTE — Discharge Summary (Signed)
Physician Discharge Summary  Randy Hanson [facility] AOZ:[id number] DOB: [birth year] DOA: 06/04/2019  PCP: [name], MD  Admit date: 06/04/2019 Discharge date: 06/06/2019  Admitted From: Home Disposition:  Home  Recommendations for Outpatient Follow-up:  1. Follow up with PCP in 1-2 weeks 2. Follow up with Cardiology as scheduled 3. Recommend repeat LFT's in one week  Discharge Condition:Stable CODE STATUS:Full Diet recommendation: Heart healthy   Brief/Interim Summary: 79 yo with hx HTN, lichen planus on immunosuppression, hx tonsillar cancer s/p radiation and in remission presented from PCP office with elevated LFT's with concern for biliary ductal obstruction.  Discharge Diagnoses:  Principal Problem:   Abdominal pain Active Problems:   Essential hypertension   Cancer of tonsil, palatine (HCC)   Elevated LFTs   Bradycardia  1. Abdominal pain with elevated LFTs concerning for CBD stones ultrasound unremarkable.  1. MRCP personally reviewed, negative 2. Clinically improving. Pt tolerating regular diet 3. Hepatitis panel is neg. LFT's trending down 4. Appreciate input by GI. Suspicion for a passed stone with no further work up recommended at this ponit. GI has since signed off 2. History of hypertension and NSVT 1. with normal LVEF 2. S/p completing recent 30 day monitor 3. Concerns of possible heart block noted overnight 4. Rhythm strip reviewed with Cardiology with formal Cardiology consultation in hospital. Per Cardiology, rhythm findings more suggestive of vasovagal response 5. Metoprolol now on hold 6. Pt to follow up with Cardiology closely on d/c  3. History of lichen planus 1. Continue on prednisone and tacrolimus. 4. History of tonsillar cancer 1. Noted to be in remission status post radiation. 2. Stable at present 5. Chronic kidney disease stage II  1. Labs reviewed, renal function appears to be at baseline.   Discharge Instructions   Allergies as of  06/06/2019      Reactions   Lisinopril Cough   Acyclovir And Related Other (See Comments)   Fever, chills   Famciclovir Other (See Comments)   Fever, chills      Medication List    STOP taking these medications   metoprolol tartrate 25 MG tablet Commonly known as: LOPRESSOR     TAKE these medications   aspirin EC 81 MG tablet Take 81 mg by mouth daily.   chlorhexidine 0.12 % solution Commonly known as: PERIDEX Use as directed 15 mLs in the mouth or throat 2 (two) times daily. Mouth rinse   diphenhydrAMINE 25 MG tablet Commonly known as: BENADRYL Take 25 mg by mouth at bedtime.   folic acid 1 MG tablet Commonly known as: FOLVITE Take 1 mg by mouth daily.   oxyCODONE 5 MG immediate release tablet Commonly known as: Oxy IR/ROXICODONE Take 1 tablet (5 mg total) by mouth every 6 (six) hours as needed for moderate pain.   polyethylene glycol 17 g packet Commonly known as: MIRALAX / GLYCOLAX Take 17 g by mouth daily as needed for mild constipation.   predniSONE 10 MG tablet Commonly known as: DELTASONE Take 10 mg by mouth daily with breakfast.   sennosides-docusate sodium 8.6-50 MG tablet Commonly known as: SENOKOT-S Take 1 tablet by mouth daily as needed for constipation.   tacrolimus 1 MG capsule Commonly known as: PROGRAF Take 1 mg by mouth 2 (two) times daily.   terazosin 2 MG capsule Commonly known as: HYTRIN Take 2 mg by mouth 2 (two) times daily.   vitamin B-12 1000 MCG tablet Commonly known as: CYANOCOBALAMIN Take 1,000 mcg by mouth daily. Chewable   Vitamin D 50  MCG (2000 UT) tablet Take 2,000 Units by mouth daily.      Follow-up Information    [name], MD. Schedule an appointment as soon as possible for a visit in 2 week(s).   Specialty: Internal Medicine Contact information: [address] [place] [address] [phone]        Wendall Stade, MD .   Specialty: Cardiology Contact information: [phone] [address] [place] [address] [phone]        Marinus Maw, MD .   Specialty: Cardiology Contact information: [phone] [address] [place] [address] [phone]          Allergies  Allergen Reactions  . Lisinopril Cough  . Acyclovir And Related Other (See Comments)    Fever, chills   . Famciclovir Other (See Comments)    Fever, chills    Consultations:  GI  Cardiology  Procedures/Studies: MR ABDOMEN MRCP WO CONTRAST  Result Date: 06/05/2019 CLINICAL DATA:  Right upper quadrant and epigastric abdominal pain. Elevated liver function tests. History of cholecystectomy and tonsillar cancer. EXAM: MRI ABDOMEN WITHOUT CONTRAST  (INCLUDING MRCP) TECHNIQUE: Multiplanar multisequence MR imaging of the abdomen was performed. Heavily T2-weighted images of the biliary and pancreatic ducts were obtained, and three-dimensional MRCP images were rendered by post processing. COMPARISON:  06/05/2019 abdominal sonogram. 07/04/2016 PET-CT. FINDINGS: Lower chest: Trace dependent bilateral pleural effusions. Hepatobiliary: Normal liver size and configuration. No hepatic steatosis. No liver mass. Cholecystectomy. Bile ducts are within normal post cholecystectomy limits. Common bile duct diameter 7 mm. No biliary filling defects, strictures, masses or beading. Pancreas: No pancreatic mass or duct dilation.  No pancreas divisum. Spleen: Normal size. No mass. Adrenals/Urinary Tract: Normal adrenals. No hydronephrosis. Simple appearing bilateral renal cysts, largest 5.6 cm in the anterior lower left kidney. Stomach/Bowel: Tiny hiatal hernia. Otherwise normal nondistended stomach. Visualized small and large bowel is normal caliber, with no bowel wall thickening. Vascular/Lymphatic: Normal caliber abdominal aorta. Portacaval 1.1 cm node is within normal limits for this station. No pathologically enlarged lymph nodes in the abdomen. Other: No abdominal ascites or focal fluid  collection. Musculoskeletal: No aggressive appearing focal osseous lesions. IMPRESSION: 1. Bile ducts are within normal post cholecystectomy limits. No biliary filling defects to suggest choledocholithiasis. 2. Trace dependent bilateral pleural effusions. 3. Tiny hiatal hernia. Electronically Signed   By: Delbert Phenix M.D.   On: 06/05/2019 10:22   DG Abd 2 Views  Result Date: 06/04/2019 CLINICAL DATA:  Upper abdominal pain EXAM: ABDOMEN - 2 VIEW COMPARISON:  04/22/2019 FINDINGS: Scattered bowel gas is noted. No obstructive changes are seen. Mild retained fecal material is noted within the colon consistent with constipation. No free air is noted. Scattered lower pole left renal calculi are again seen and stable. Multilevel degenerative changes are noted. IMPRESSION: Mild constipation. Nonobstructing left renal stones. No other focal abnormality is noted. Electronically Signed   By: Alcide Clever M.D.   On: 06/04/2019 09:47   US Abdomen Limited RUQ  Result Date: 06/05/2019 CLINICAL DATA:  Initial evaluation for acute right upper quadrant pain for 3 days. EXAM: ULTRASOUND ABDOMEN LIMITED RIGHT UPPER QUADRANT COMPARISON:  Prior ultrasound from 03/22/2019. FINDINGS: Gallbladder: Surgically absent. Common bile duct: Diameter: 4.6 mm Liver: No focal lesion identified. Within normal limits in parenchymal echogenicity. Portal vein is patent on color Doppler imaging with normal direction of blood flow towards the liver. Other: None. IMPRESSION: 1. Prior cholecystectomy. 2. No biliary dilatation or other acute  abnormality. 3. Normal liver. Electronically Signed   By: Rise Mu M.D.   On: 06/05/2019 01:57     Subjective: Eager to go home. Feels better  Discharge Exam: Vitals:   06/06/19 0917 06/06/19 0919  BP:  (!) 147/78  Pulse: 62 60  Resp:  18  Temp:  97.7 F (36.5 C)  SpO2:  96%   Vitals:   06/06/19 0206 06/06/19 0558 06/06/19 0917 06/06/19 0919  BP: 135/83 (!) 155/94  (!) 147/78   Pulse: (!) 58 (!) 56 62 60  Resp: 17 14  18   Temp: 97.9 F (36.6 C) 97.7 F (36.5 C)  97.7 F (36.5 C)  TempSrc: Oral Oral  Oral  SpO2: 94% 98%  96%  Weight:  91 kg    Height:        General: Pt is alert, awake, not in acute distress Cardiovascular: RRR, S1/S2 +, no rubs, no gallops Respiratory: CTA bilaterally, no wheezing, no rhonchi Abdominal: Soft, NT, ND, bowel sounds + Extremities: no edema, no cyanosis   The results of significant diagnostics from this hospitalization (including imaging, microbiology, ancillary and laboratory) are listed below for reference.     Microbiology: Recent Results (from the past 240 hour(s))  SARS CORONAVIRUS 2 (TAT 6-24 HRS) Nasopharyngeal Nasopharyngeal Swab     Status: None   Collection Time: 06/04/19 11:56 PM   Specimen: Nasopharyngeal Swab  Result Value Ref Range Status   SARS Coronavirus 2 NEGATIVE NEGATIVE Final    Comment: (NOTE) SARS-CoV-2 target nucleic acids are NOT DETECTED. The SARS-CoV-2 RNA is generally detectable in upper and lower respiratory specimens during the acute phase of infection. Negative results do not preclude SARS-CoV-2 infection, do not rule out co-infections with other pathogens, and should not be used as the sole basis for treatment or other patient management decisions. Negative results must be combined with clinical observations, patient history, and epidemiological information. The expected result is Negative. Fact Sheet for Patients: HairSlick.no Fact Sheet for Healthcare Providers: quierodirigir.com This test is not yet approved or cleared by the [place] FDA and  has been authorized for detection and/or diagnosis of SARS-CoV-2 by FDA under an Emergency Use Authorization (EUA). This EUA will remain  in effect (meaning this test can be used) for the duration of the COVID-19 declaration under Section 56 4(b)(1) of the Act, 21 U.S.C. section  360bbb-3(b)(1), unless the authorization is terminated or revoked sooner. Performed at [facility], [address], [place], [place] [address]      Labs: BNP (last 3 results) No results for input(s): BNP in the last 8760 hours. Basic Metabolic Panel: Recent Labs  Lab 06/04/19 2345 06/06/19 0441  NA 140 139  K 3.7 3.8  CL 104 106  CO2 22 24  GLUCOSE 105* 102*  BUN 17 13  CREATININE 1.25* 0.97  CALCIUM 9.3 9.5  MG  --  1.9   Liver Function Tests: Recent Labs  Lab 06/04/19 2345 06/06/19 0441  AST 71* 39  ALT 163* 110*  ALKPHOS 139* 126  BILITOT 2.4* 1.7*  PROT 6.2* 5.9*  ALBUMIN 3.4* 3.0*   Recent Labs  Lab 06/04/19 2345  LIPASE 25   No results for input(s): AMMONIA in the last 168 hours. CBC: Recent Labs  Lab 06/04/19 2345  WBC 6.7  NEUTROABS 5.6  HGB 14.3  HCT 42.4  MCV 97.7  PLT 205   Cardiac Enzymes: No results for input(s): CKTOTAL, CKMB, CKMBINDEX, TROPONINI in the last 168 hours. BNP:  Invalid input(s): POCBNP CBG: No results for input(s): GLUCAP in the last 168 hours. D-Dimer No results for input(s): DDIMER in the last 72 hours. Hgb A1c No results for input(s): HGBA1C in the last 72 hours. Lipid Profile No results for input(s): CHOL, HDL, LDLCALC, TRIG, CHOLHDL, LDLDIRECT in the last 72 hours. Thyroid function studies No results for input(s): TSH, T4TOTAL, T3FREE, THYROIDAB in the last 72 hours.  Invalid input(s): FREET3 Anemia work up No results for input(s): VITAMINB12, FOLATE, FERRITIN, TIBC, IRON, RETICCTPCT in the last 72 hours. Urinalysis    Component Value Date/Time   COLORURINE YELLOW 06/04/2019 2326   APPEARANCEUR CLEAR 06/04/2019 2326   LABSPEC 1.010 06/04/2019 2326   PHURINE 5.0 06/04/2019 2326   GLUCOSEU NEGATIVE 06/04/2019 2326   HGBUR SMALL (A) 06/04/2019 2326   BILIRUBINUR NEGATIVE 06/04/2019 2326   KETONESUR 5 (A) 06/04/2019 2326   PROTEINUR NEGATIVE 06/04/2019 2326   NITRITE NEGATIVE 06/04/2019 2326    LEUKOCYTESUR NEGATIVE 06/04/2019 2326   Sepsis Labs Invalid input(s): PROCALCITONIN,  WBC,  LACTICIDVEN Microbiology Recent Results (from the past 240 hour(s))  SARS CORONAVIRUS 2 (TAT 6-24 HRS) Nasopharyngeal Nasopharyngeal Swab     Status: None   Collection Time: 06/04/19 11:56 PM   Specimen: Nasopharyngeal Swab  Result Value Ref Range Status   SARS Coronavirus 2 NEGATIVE NEGATIVE Final    Comment: (NOTE) SARS-CoV-2 target nucleic acids are NOT DETECTED. The SARS-CoV-2 RNA is generally detectable in upper and lower respiratory specimens during the acute phase of infection. Negative results do not preclude SARS-CoV-2 infection, do not rule out co-infections with other pathogens, and should not be used as the sole basis for treatment or other patient management decisions. Negative results must be combined with clinical observations, patient history, and epidemiological information. The expected result is Negative. Fact Sheet for Patients: HairSlick.no Fact Sheet for Healthcare Providers: quierodirigir.com This test is not yet approved or cleared by the [place] FDA and  has been authorized for detection and/or diagnosis of SARS-CoV-2 by FDA under an Emergency Use Authorization (EUA). This EUA will remain  in effect (meaning this test can be used) for the duration of the COVID-19 declaration under Section 56 4(b)(1) of the Act, 21 U.S.C. section 360bbb-3(b)(1), unless the authorization is terminated or revoked sooner. Performed at [facility], [address] [place], [place] [address]    Time spent:  SIGNED:   Rickey Barbara, MD  [facility] 06/06/2019, 1:35 PM  If 7PM-7AM, please contact night-coverage

## 2019-06-06 NOTE — Progress Notes (Signed)
DISCHARGE NOTE HOME Jeremaine L Tse to be discharged Home per MD order. Discussed prescriptions and follow up appointments with the patient. Prescriptions given to patient; medication list explained in detail. Patient verbalized understanding.  Skin clean, dry and intact without evidence of skin break down, no evidence of skin tears noted. IV catheter discontinued intact. Site without signs and symptoms of complications. Dressing and pressure applied. Pt denies pain at the site currently. No complaints noted.  Patient free of lines, drains, and wounds.   An After Visit Summary (AVS) was printed and given to the patient. Patient escorted via wheelchair, and discharged home via private auto with family member  Dolores Hoose, RN

## 2019-06-06 NOTE — Plan of Care (Signed)
  Problem: Education: Goal: Knowledge of General Education information will improve Description: Including pain rating scale, medication(s)/side effects and non-pharmacologic comfort measures Outcome: Adequate for Discharge   Problem: Clinical Measurements: Goal: Ability to maintain clinical measurements within normal limits will improve Outcome: Adequate for Discharge Goal: Will remain free from infection Outcome: Adequate for Discharge Goal: Diagnostic test results will improve 06/06/2019 1344 by Dolores Hoose, RN Outcome: Adequate for Discharge 06/06/2019 0931 by Dolores Hoose, RN Outcome: Progressing Goal: Respiratory complications will improve Outcome: Adequate for Discharge Goal: Cardiovascular complication will be avoided Outcome: Adequate for Discharge

## 2019-06-07 ENCOUNTER — Other Ambulatory Visit: Payer: Self-pay | Admitting: Cardiovascular Disease

## 2019-06-07 DIAGNOSIS — R1010 Upper abdominal pain, unspecified: Secondary | ICD-10-CM | POA: Diagnosis not present

## 2019-06-07 DIAGNOSIS — I480 Paroxysmal atrial fibrillation: Secondary | ICD-10-CM

## 2019-06-07 NOTE — Progress Notes (Signed)
CARDIOLOGY CONSULT NOTE       Patient ID: Randy Hanson MRN: [id number] DOB/AGE: 06-02-1940 79 y.o.   Primary Physician: [name], MD Primary Cardiologist: Brigett Estell/Taylor   HPI:  79 y.o. first seen in hospital 03/22/19.  History of throat cancer in remission and previous smoking. He had acute cholecystitis and presented in afib.  He has had issues with hematuria He converted with beta blockers No issues with his gallbladder surgery CHADVASC 3 Not anticoagulated due to issues with hematuria and conversion. Seen by Dr Ladona Ridgel 05/18/19 and some of his outpatient monitor showed asymptomatic NSVT but no PAF  TTE done in hospital 03/22/19 EF 65-70% MAC AV sclerosis and normal atrial sizes  He has no history of CAD  Mont[place]:  With PAF, wenkebach 2:1 block and 3.2 second pause   D/c from hospital 06/06/19 with concern for CBD stone but [place] and MRCP negative LFTls elevated Suspicion For passed stone ? Vasovagal episode in hospital with bradycardia and lopressor d/c Episode occurred early in am  With wenckebach  This patients CHA2DS2-VASc Score and unadjusted Ischemic Stroke Rate (% per year) is equal to 3.2 % stroke rate/year from a score of 3  Above score calculated as 1 point each if present [CHF, HTN, DM, Vascular=MI/PAD/Aortic Plaque, Age if 65-74, or Male] Above score calculated as 2 points each if present [Age > 75, or Stroke/TIA/TE]  He denies chest pain or pre syncope   ROS All other systems reviewed and negative except as noted above  Past Medical History:  Diagnosis Date  . Back pain   . BPH (benign prostatic hyperplasia)   . 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 DJD (degenerative joint disease)   . Esophageal reflux   . FHx: colonic polyps   . Hemorrhoids   . History of radiation therapy 08/28/15-- 10/16/15   Right Tonsil and bilateral neck  . Hypercholesteremia   . Hyperlipemia   . Hypertension   . Kidney cysts    [facility] [place] [facility]    . Posterior vitreous detachment, left eye    DR. KATHERINE HECKER  AUGUST 2011  . Radiation 08/28/15- 10/16/15   Right Tonsil and Bilateral Neck    Family History  Problem Relation Age of Onset  . Hypertension Mother     Social History   Socioeconomic History  . Marital status: Married    Spouse name: Not on file  . Number of children: Not on file  . Years of education: Not on file  . Highest education level: Not on file  Occupational History  . Not on file  Tobacco Use  . Smoking status: Former Games developer  . Smokeless tobacco: Never Used  . Tobacco comment: quit 35 years ago  Substance and Sexual Activity  . Alcohol use: No    Alcohol/week: 0.0 standard drinks  . Drug use: No  . Sexual activity: Not on file  Other Topics Concern  . Not on file  Social History Narrative   TOBACCO USE CIGARETTES: NEVER SMOKED.NO SMOKING.NO ALCOHOL .CAFFEINE YES:NO  RECREATIONAL DRUGS. OCCUPATION :RETIRED   MARTIAL STATUS : MARRIED    Social Determinants of Health   Financial Resource Strain:   . Difficulty of Paying Living Expenses:   Food Insecurity:   . Worried About Programme researcher, broadcasting/film/video in the Last Year:   . Barista in the Last Year:   Transportation Needs: No Transportation Needs  . Lack of Transportation (Medical): No  .  Lack of Transportation (Non-Medical): No  Physical Activity:   . Days of Exercise per Week:   . Minutes of Exercise per Session:   Stress:   . Feeling of Stress :   Social Connections:   . Frequency of Communication with Friends and Family:   . Frequency of Social Gatherings with Friends and Family:   . Attends Religious Services:   . Active Member of Clubs or Organizations:   . Attends Banker Meetings:   Marland Kitchen Marital Status:   Intimate Partner Violence:   . Fear of Current or Ex-Partner:   . Emotionally Abused:   Marland Kitchen Physically Abused:   . Sexually Abused:     Past Surgical History:  Procedure Laterality Date  . CARDIAC CATHETERIZATION      remote >15 years ago  . CHOLECYSTECTOMY N/A 03/23/2019   Procedure: LAPAROSCOPIC CHOLECYSTECTOMY;  Surgeon: Abigail Miyamoto, MD;  Location: [facility] OR;  Service: General;  Laterality: N/A;  . IR GASTROSTOMY TUBE REMOVAL  02/24/2017  . IR GENERIC HISTORICAL  10/11/2015   IR PATIENT EVAL TECH 0-60 MINS 10/11/2015 Darrell K Allred, PA-C [facility]  . IR PATIENT EVAL TECH 0-60 MINS  07/05/2016  . IR PATIENT EVAL TECH 0-60 MINS  08/14/2016  . IR PATIENT EVAL TECH 0-60 MINS  12/09/2016  . IR REMOVAL TUN ACCESS W/ PORT W/O FL MOD SED  07/22/2016  . VASECTOMY  1975      Current Outpatient Medications:  .  aspirin EC 81 MG tablet, Take 81 mg by mouth daily., Disp: , Rfl:  .  chlorhexidine (PERIDEX) 0.12 % solution, Use as directed 15 mLs in the mouth or throat 2 (two) times daily. Mouth rinse, Disp: , Rfl:  .  Cholecalciferol (VITAMIN D) 50 MCG (2000 UT) tablet, Take 2,000 Units by mouth daily., Disp: , Rfl:  .  diphenhydrAMINE (BENADRYL) 25 MG tablet, Take 25 mg by mouth at bedtime., Disp: , Rfl:  .  folic acid (FOLVITE) 1 MG tablet, Take 1 mg by mouth daily. , Disp: , Rfl:  .  oxyCODONE (OXY IR/ROXICODONE) 5 MG immediate release tablet, Take 1 tablet (5 mg total) by mouth every 6 (six) hours as needed for moderate pain., Disp: 20 tablet, Rfl: 0 .  polyethylene glycol (MIRALAX / GLYCOLAX) 17 g packet, Take 17 g by mouth daily as needed for mild constipation., Disp: 14 each, Rfl: 0 .  predniSONE (DELTASONE) 10 MG tablet, Take 10 mg by mouth daily with breakfast., Disp: , Rfl:  .  sennosides-docusate sodium (SENOKOT-S) 8.6-50 MG tablet, Take 1 tablet by mouth daily as needed for constipation., Disp: , Rfl:  .  tacrolimus (PROGRAF) 1 MG capsule, Take 1 mg by mouth 2 (two) times daily., Disp: , Rfl:  .  terazosin (HYTRIN) 2 MG capsule, Take 2 mg by mouth 2 (two) times daily. , Disp: , Rfl:  .  vitamin B-12 (CYANOCOBALAMIN) 1000 MCG tablet, Take 1,000 mcg by mouth daily. Chewable, Disp: , Rfl:     Physical  Exam: Blood pressure 138/82, pulse 92, height 6' (1.829 m), weight 202 lb (91.6 kg), SpO2 98 %.    Affect appropriate Elderly male  HEENT: normal Neck supple with no adenopathy JVP normal no bruits no thyromegaly Lungs clear with no wheezing and good diaphragmatic motion Heart:  S1/S2 AV sclerosis murmur, no rub, gallop or click PMI normal Abdomen: benighn, BS positve, no tenderness, no AAA no bruit.  No HSM or HJR post lap choly Distal pulses intact with  no bruits No edema Neuro non-focal Skin warm and dry No muscular weakness   Labs:   Lab Results  Component Value Date   WBC 6.7 06/04/2019   HGB 14.3 06/04/2019   HCT 42.4 06/04/2019   MCV 97.7 06/04/2019   PLT 205 06/04/2019    Recent Labs  Lab 06/06/19 0441  NA 139  K 3.8  CL 106  CO2 24  BUN 13  CREATININE 0.97  CALCIUM 9.5  PROT 5.9*  BILITOT 1.7*  ALKPHOS 126  ALT 110*  AST 39  GLUCOSE 102*    Radiology: MR ABDOMEN MRCP WO CONTRAST  Result Date: 06/05/2019 CLINICAL DATA:  Right upper quadrant and epigastric abdominal pain. Elevated liver function tests. History of cholecystectomy and tonsillar cancer. EXAM: MRI ABDOMEN WITHOUT CONTRAST  (INCLUDING MRCP) TECHNIQUE: Multiplanar multisequence MR imaging of the abdomen was performed. Heavily T2-weighted images of the biliary and pancreatic ducts were obtained, and three-dimensional MRCP images were rendered by post processing. COMPARISON:  06/05/2019 abdominal sonogram. 07/04/2016 PET-CT. FINDINGS: Lower chest: Trace dependent bilateral pleural effusions. Hepatobiliary: Normal liver size and configuration. No hepatic steatosis. No liver mass. Cholecystectomy. Bile ducts are within normal post cholecystectomy limits. Common bile duct diameter 7 mm. No biliary filling defects, strictures, masses or beading. Pancreas: No pancreatic mass or duct dilation.  No pancreas divisum. Spleen: Normal size. No mass. Adrenals/Urinary Tract: Normal adrenals. No hydronephrosis.  Simple appearing bilateral renal cysts, largest 5.6 cm in the anterior lower left kidney. Stomach/Bowel: Tiny hiatal hernia. Otherwise normal nondistended stomach. Visualized small and large bowel is normal caliber, with no bowel wall thickening. Vascular/Lymphatic: Normal caliber abdominal aorta. Portacaval 1.1 cm node is within normal limits for this station. No pathologically enlarged lymph nodes in the abdomen. Other: No abdominal ascites or focal fluid collection. Musculoskeletal: No aggressive appearing focal osseous lesions. IMPRESSION: 1. Bile ducts are within normal post cholecystectomy limits. No biliary filling defects to suggest choledocholithiasis. 2. Trace dependent bilateral pleural effusions. 3. Tiny hiatal hernia. Electronically Signed   By: Delbert Phenix M.D.   On: 06/05/2019 10:22   Cardiac event monitor  Result Date: 06/07/2019 NSR first degree Periods of PAF with rates as fast as 150 bpm Frequent wenkebach, periods of 2:1 AV block Longest pause 3.2 seconds Short runs of NSVT Patient will f/u with EP Dr Ladona Ridgel ? PPM/Ablation vs AAT and anticoagulation   DG Abd 2 Views  Result Date: 06/04/2019 CLINICAL DATA:  Upper abdominal pain EXAM: ABDOMEN - 2 VIEW COMPARISON:  04/22/2019 FINDINGS: Scattered bowel gas is noted. No obstructive changes are seen. Mild retained fecal material is noted within the colon consistent with constipation. No free air is noted. Scattered lower pole left renal calculi are again seen and stable. Multilevel degenerative changes are noted. IMPRESSION: Mild constipation. Nonobstructing left renal stones. No other focal abnormality is noted. Electronically Signed   By: Alcide Clever M.D.   On: 06/04/2019 09:47   US Abdomen Limited RUQ  Result Date: 06/05/2019 CLINICAL DATA:  Initial evaluation for acute right upper quadrant pain for 3 days. EXAM: ULTRASOUND ABDOMEN LIMITED RIGHT UPPER QUADRANT COMPARISON:  Prior ultrasound from 03/22/2019. FINDINGS: Gallbladder:  Surgically absent. Common bile duct: Diameter: 4.6 mm Liver: No focal lesion identified. Within normal limits in parenchymal echogenicity. Portal vein is patent on color Doppler imaging with normal direction of blood flow towards the liver. Other: None. IMPRESSION: 1. Prior cholecystectomy. 2. No biliary dilatation or other acute abnormality. 3. Normal liver. Electronically Signed   By: Sharlet Salina  Phill Myron M.D.   On: 06/05/2019 01:57    EKG: SR rate 66 normal 05/07/19   ASSESSMENT AND PLAN:   1. PAF:  Short lived during admission for cholecystitis Spontaneous conversion Seen by EP Dr Ladona Ridgel indicated would not anticoagulate Was on beta blocker but d/c with wenkebach and vagal reaction while in hospitial for CBD stone No symptoms and normal LV function on echo Monitor confirms recurrent PAF with SSS AV block and pauses. Will start eliquis 5 bid. Discussed stroke risk Will order exercise myovue to r/o CAD and further assess HR response to stress. Has had beta blocker d/c Will f/u with Dr Ladona Ridgel after stress test to discuss possible AAT or PPM or both Samples of eliquis given as well as 30 day free card   2. HTN:  Well controlled.  Continue current medications and low sodium Dash type diet.    Time spent reviewing chart, recent hospitalization , monitor , echo direct patient interview and exam as well as formulating plan and f/u with EP 45 minutes   Signed: Charlton Haws 06/10/2019, 2:59 PM

## 2019-06-09 ENCOUNTER — Telehealth: Payer: Self-pay | Admitting: Cardiovascular Disease

## 2019-06-09 NOTE — Telephone Encounter (Signed)
Called patient's wife (DPR). Patient's wife would like patient to see Dr. Johnsie Cancel first before going to see Dr. Lovena Le. Patient's wife was stating that at the hospital they told patient he was fine from heart stand point. Informed her that they were more likely saying the patient is stable to go home. Informed wife that they can keep their appointment tomorrow with Dr. Johnsie Cancel to discuss all their concerns first before seeing Dr. Lovena Le. Patient's wife agreed to plan.

## 2019-06-09 NOTE — Telephone Encounter (Signed)
New message    Called pt to schedule an appt with Dr. Lovena Le per Dr. Johnsie Cancel. Spoke with pt wife, she doesn't understand why pt needs another appt since he has an appt with Dr. Johnsie Cancel. She said they were still waiting on a call about monitor. I told her Dr. Johnsie Cancel would like him to see Dr. Lovena Le about his monitor but she would like a call from Dr. Johnsie Cancel nurse before scheduling the appt.

## 2019-06-10 ENCOUNTER — Encounter: Payer: Self-pay | Admitting: Cardiovascular Disease

## 2019-06-10 ENCOUNTER — Ambulatory Visit (INDEPENDENT_AMBULATORY_CARE_PROVIDER_SITE_OTHER): Payer: Medicare Other | Admitting: Cardiovascular Disease

## 2019-06-10 ENCOUNTER — Other Ambulatory Visit: Payer: Self-pay

## 2019-06-10 VITALS — BP 138/82 | HR 92 | Ht 72.0 in | Wt 202.0 lb

## 2019-06-10 DIAGNOSIS — R9431 Abnormal electrocardiogram [ECG] [EKG]: Secondary | ICD-10-CM

## 2019-06-10 DIAGNOSIS — I480 Paroxysmal atrial fibrillation: Secondary | ICD-10-CM | POA: Diagnosis not present

## 2019-06-10 MED ORDER — APIXABAN 5 MG PO TABS: 5.0000 mg | ORAL_TABLET | Freq: Two times a day (BID) | ORAL | 11 refills | Status: AC

## 2019-06-10 NOTE — Patient Instructions (Addendum)
Medication Instructions:  Your physician has recommended you make the following change in your medication:  1-START eliquis 5 mg by mouth twice daily.  *If you need a refill on your cardiac medications before your next appointment, please call your pharmacy*  Lab Work: If you have labs (blood work) drawn today and your tests are completely normal, you will receive your results only by: Marland Kitchen MyChart Message (if you have MyChart) OR . A paper copy in the mail If you have any lab test that is abnormal or we need to change your treatment, we will call you to review the results.  Testing/Procedures: Your physician has requested that you have en exercise stress myoview. For further information please visit [email or web address]. Please follow instruction sheet, as given.  Follow-Up: At [facility], you and your health needs are our priority.  As part of our continuing mission to provide you with exceptional heart care, we have created designated Provider Care Teams.  These Care Teams include your primary Cardiologist (physician) and Advanced Practice Providers (APPs -  Physician Assistants and Nurse Practitioners) who all work together to provide you with the care you need, when you need it.  We recommend signing up for the patient portal called "MyChart".  Sign up information is provided on this After Visit Summary.  MyChart is used to connect with patients for Virtual Visits (Telemedicine).  Patients are able to view lab/test results, encounter notes, upcoming appointments, etc.  Non-urgent messages can be sent to your provider as well.   To learn more about what you can do with MyChart, go to [email or web address].    Your next appointment:   After stress test  The format for your next appointment:   In person  Provider:   Dr. Lovena Le

## 2019-06-14 ENCOUNTER — Other Ambulatory Visit (HOSPITAL_COMMUNITY)
Admission: RE | Admit: 2019-06-14 | Discharge: 2019-06-14 | Disposition: A | Discharge status: Home / Self Care | Payer: Medicare Other | Source: Ambulatory Visit | Attending: Cardiovascular Disease | Admitting: Cardiovascular Disease

## 2019-06-14 DIAGNOSIS — Z20822 Contact with and (suspected) exposure to covid-19: Secondary | ICD-10-CM | POA: Insufficient documentation

## 2019-06-14 DIAGNOSIS — Z01812 Encounter for preprocedural laboratory examination: Secondary | ICD-10-CM | POA: Insufficient documentation

## 2019-06-14 LAB — SARS CORONAVIRUS 2 (TAT 6-24 HRS): SARS Coronavirus 2: NEGATIVE

## 2019-06-15 ENCOUNTER — Telehealth: Payer: Self-pay

## 2019-06-15 ENCOUNTER — Telehealth (HOSPITAL_COMMUNITY): Payer: Self-pay

## 2019-06-15 NOTE — Telephone Encounter (Signed)
**Note De-Identified  Obfuscation** I started an Eliquis PA through covermymeds. Key: [id number]  Message received from [facility] stating that this PA has been approved. Approval good until 03/10/2020 PA Case# [id number]  I have notified [place] of this approval.

## 2019-06-15 NOTE — Telephone Encounter (Signed)
Spoke wit the patient, detailed instructions given. He stated that he understood and would be here for his test. Asked to call back with any questions. S.Brelee Renk EMTP

## 2019-06-17 ENCOUNTER — Ambulatory Visit (HOSPITAL_COMMUNITY): Payer: Medicare Other | Attending: Cardiovascular Disease

## 2019-06-17 ENCOUNTER — Other Ambulatory Visit: Payer: Self-pay

## 2019-06-17 DIAGNOSIS — R9431 Abnormal electrocardiogram [ECG] [EKG]: Secondary | ICD-10-CM | POA: Diagnosis not present

## 2019-06-17 LAB — MYOCARDIAL PERFUSION IMAGING
Estimated workload: 7 METS
Exercise duration (min): 6 min
Exercise duration (sec): 0 s
LV dias vol: 84 mL (ref 62–150)
LV sys vol: 5 mL
MPHR: 144 {beats}/min
Peak HR: 150 {beats}/min
Percent HR: 104 %
Rest HR: 97 {beats}/min
SDS: 1
SRS: 0
SSS: 1
TID: 0.84

## 2019-06-17 MED ORDER — REGADENOSON 0.4 MG/5ML IV SOLN: 0.4000 mg | Freq: Once | INTRAVENOUS | Status: DC

## 2019-06-17 MED ORDER — TECHNETIUM TC 99M TETROFOSMIN IV KIT
31.3000 | PACK | Freq: Once | INTRAVENOUS | Status: AC | PRN
  Administered 2019-06-17: 31.3 via INTRAVENOUS
  Filled 2019-06-17: qty 32

## 2019-06-17 MED ORDER — TECHNETIUM TC 99M TETROFOSMIN IV KIT
9.8000 | PACK | Freq: Once | INTRAVENOUS | Status: AC | PRN
  Administered 2019-06-17: 9.8 via INTRAVENOUS
  Filled 2019-06-17: qty 10

## 2019-07-07 ENCOUNTER — Ambulatory Visit (INDEPENDENT_AMBULATORY_CARE_PROVIDER_SITE_OTHER): Payer: Medicare Other | Admitting: Internal Medicine

## 2019-07-07 ENCOUNTER — Encounter: Payer: Self-pay | Admitting: Internal Medicine

## 2019-07-07 ENCOUNTER — Other Ambulatory Visit: Payer: Self-pay

## 2019-07-07 VITALS — BP 142/72 | HR 66 | Ht 72.0 in | Wt 201.0 lb

## 2019-07-07 DIAGNOSIS — I4729 Other ventricular tachycardia: Secondary | ICD-10-CM | POA: Insufficient documentation

## 2019-07-07 DIAGNOSIS — I472 Ventricular tachycardia: Secondary | ICD-10-CM

## 2019-07-07 DIAGNOSIS — I480 Paroxysmal atrial fibrillation: Secondary | ICD-10-CM | POA: Diagnosis not present

## 2019-07-07 DIAGNOSIS — I10 Essential (primary) hypertension: Secondary | ICD-10-CM

## 2019-07-07 NOTE — Patient Instructions (Addendum)
Medication Instructions:  Your physician recommends that you continue on your current medications as directed. Please refer to the Current Medication list given to you today.  Labwork: None ordered.  Testing/Procedures: None ordered.  Follow-Up: Your physician wants you to follow-up in: as needed with Dr. Taylor.      Any Other Special Instructions Will Be Listed Below (If Applicable).  If you need a refill on your cardiac medications before your next appointment, please call your pharmacy.   

## 2019-07-07 NOTE — Progress Notes (Signed)
HPI Randy Hanson returns today for followup. He is a pleasant 79 yo man with a h/o PAF, tachybrady syndrome and NSVT. I saw him about 6 weeks ago. He has worn a cardiac monitor which demonstrated sins brady with pauses as well as NSVT. He is asymptomatic. He denies chest pain, sob, dizzy spells, loss of vision or hearing. He denies any complaints attributable to either slow or fast heart beats.  Allergies  Allergen Reactions  . Lisinopril Cough  . Acyclovir And Related Other (See Comments)    Fever, chills   . Famciclovir Other (See Comments)    Fever, chills     Current Outpatient Medications  Medication Sig Dispense Refill  . apixaban (ELIQUIS) 5 MG TABS tablet Take 1 tablet (5 mg total) by mouth 2 (two) times daily. 60 tablet 11  . aspirin EC 81 MG tablet Take 81 mg by mouth daily.    . chlorhexidine (PERIDEX) 0.12 % solution Use as directed 15 mLs in the mouth or throat 2 (two) times daily. Mouth rinse    . Cholecalciferol (VITAMIN D) 50 MCG (2000 UT) tablet Take 2,000 Units by mouth daily.    . diphenhydrAMINE (BENADRYL) 25 MG tablet Take 25 mg by mouth at bedtime.    . folic acid (FOLVITE) 1 MG tablet Take 1 mg by mouth daily.     Marland Kitchen oxyCODONE (OXY IR/ROXICODONE) 5 MG immediate release tablet Take 1 tablet (5 mg total) by mouth every 6 (six) hours as needed for moderate pain. 20 tablet 0  . polyethylene glycol (MIRALAX / GLYCOLAX) 17 g packet Take 17 g by mouth daily as needed for mild constipation. 14 each 0  . sennosides-docusate sodium (SENOKOT-S) 8.6-50 MG tablet Take 1 tablet by mouth daily as needed for constipation.    . tacrolimus (PROGRAF) 1 MG capsule Take 1 mg by mouth 2 (two) times daily.    Marland Kitchen terazosin (HYTRIN) 2 MG capsule Take 2 mg by mouth 2 (two) times daily.     . vitamin B-12 (CYANOCOBALAMIN) 1000 MCG tablet Take 1,000 mcg by mouth daily. Chewable     No current facility-administered medications for this visit.   Facility-Administered Medications Ordered in  Other Visits  Medication Dose Route Frequency Provider Last Rate Last Admin  . regadenoson (LEXISCAN) injection SOLN 0.4 mg  0.4 mg Intravenous Once Croitoru, Mihai, MD         Past Medical History:  Diagnosis Date  . Back pain   . BPH (benign prostatic hyperplasia)   . Cancer of tonsil, palatine ([place]) 07/12/2015  . DJD (degenerative joint disease)   . Esophageal reflux   . FHx: colonic polyps   . Hemorrhoids   . History of radiation therapy 08/28/15-- 10/16/15   Right Tonsil and bilateral neck  . Hypercholesteremia   . Hyperlipemia   . Hypertension   . Kidney cysts    [facility] [place] [facility]2012  . Posterior vitreous detachment, left eye    DR. KATHERINE HECKER  AUGUST 2011  . Radiation 08/28/15- 10/16/15   Right Tonsil and Bilateral Neck    ROS:   All systems reviewed and negative except as noted in the HPI.   Past Surgical History:  Procedure Laterality Date  . CARDIAC CATHETERIZATION     remote >15 years ago  . CHOLECYSTECTOMY N/A 03/23/2019   Procedure: LAPAROSCOPIC CHOLECYSTECTOMY;  Surgeon: Coralie Keens, MD;  Location: [facility];  Service: General;  Laterality: N/A;  . IR GASTROSTOMY  TUBE REMOVAL  02/24/2017  . IR GENERIC HISTORICAL  10/11/2015   IR PATIENT EVAL TECH 0-60 MINS 10/11/2015 Darrell K Allred, PA-C [facility]  . IR PATIENT EVAL TECH 0-60 MINS  07/05/2016  . IR PATIENT EVAL TECH 0-60 MINS  08/14/2016  . IR PATIENT EVAL TECH 0-60 MINS  12/09/2016  . IR REMOVAL TUN ACCESS W/ PORT W/O FL MOD SED  07/22/2016  . VASECTOMY  1975     Family History  Problem Relation Age of Onset  . Hypertension Mother      Social History   Socioeconomic History  . Marital status: Married    Spouse name: Not on file  . Number of children: Not on file  . Years of education: Not on file  . Highest education level: Not on file  Occupational History  . Not on file  Tobacco Use  . Smoking status: Former Research scientist (life sciences)  . Smokeless tobacco: Never Used  . Tobacco  comment: quit 35 years ago  Substance and Sexual Activity  . Alcohol use: No    Alcohol/week: 0.0 standard drinks  . Drug use: No  . Sexual activity: Not on file  Other Topics Concern  . Not on file  Social History Narrative   TOBACCO USE CIGARETTES: NEVER SMOKED.NO SMOKING.NO ALCOHOL .CAFFEINE YES:NO  RECREATIONAL DRUGS. OCCUPATION :RETIRED   MARTIAL STATUS : MARRIED    Social Determinants of Health   Financial Resource Strain:   . Difficulty of Paying Living Expenses:   Food Insecurity:   . Worried About Charity fundraiser in the Last Year:   . Arboriculturist in the Last Year:   Transportation Needs: No Transportation Needs  . Lack of Transportation (Medical): No  . Lack of Transportation (Non-Medical): No  Physical Activity:   . Days of Exercise per Week:   . Minutes of Exercise per Session:   Stress:   . Feeling of Stress :   Social Connections:   . Frequency of Communication with Friends and Family:   . Frequency of Social Gatherings with Friends and Family:   . Attends Religious Services:   . Active Member of Clubs or Organizations:   . Attends Archivist Meetings:   Marland Kitchen Marital Status:   Intimate Partner Violence:   . Fear of Current or Ex-Partner:   . Emotionally Abused:   Marland Kitchen Physically Abused:   . Sexually Abused:      BP (!) 142/72   Pulse 66   Ht 6' (1.829 m)   Wt 201 lb (91.2 kg)   SpO2 97%   BMI 27.26 kg/m   Physical Exam:  Well appearing NAD HEENT: Unremarkable Neck:  No JVD, no thyromegally Lymphatics:  No adenopathy Back:  No CVA tenderness Lungs:  Clear with no wheezes HEART:  Regular rate rhythm, no murmurs, no rubs, no clicks Abd:  soft, positive bowel sounds, no organomegally, no rebound, no guarding Ext:  2 plus pulses, no edema, no cyanosis, no clubbing Skin:  No rashes no nodules Neuro:  CN II through XII intact, motor grossly intact  EKG - nsr  Cardiac monitor - reviewed and is as noted above.   Assess/Plan: 1.  Tachy-brady syndrome - he is not symptomatic although he has criteria for PPM insertion. I discussed in detail the symptoms he might experience or to watch out for with regard to his brady arrhythmias and he will call us if he develops any symptoms.  2. HTN - His SBP is up  a bit and he will continue his current meds. We are limited a bit by his bradycardia.   Mikle Bosworth.D

## 2019-07-27 ENCOUNTER — Telehealth: Payer: Self-pay | Admitting: Cardiovascular Disease

## 2019-07-27 NOTE — Telephone Encounter (Signed)
Returned call to pt and pt's wife.  They were asking if it was ok for pt to take Apixaban instead of Elqius, since pt can get it for free at the [place].  I discussed with Keitha Butte, RN, and she stated that it is a generic, but not a generic price, and there is no difference with Eliquis and Apixaban, that they do the same and that pt should be ok taking the generic for Eliquis.  I relayed the message to pt / pt's wife and they were grateful for the help.

## 2019-07-27 NOTE — Telephone Encounter (Signed)
Pt c/o medication issue:  1. Name of Medication: ELIQUIS  2. How are you currently taking this medication (dosage and times per day)?   3. Are you having a reaction (difficulty breathing--STAT)?   4. What is your medication issue? He wife called she is wondering if he can be put on the generic brand of Eliquis.

## 2019-08-05 NOTE — Progress Notes (Signed)
Mr. Fang presents for follow up of radiation completed 10/16/2015 to his right tonsil and bilateral neck.   Pain issues, if any: Patient denies Using a feeding tube?: N/A Weight changes, if any:  Wt Readings from Last 3 Encounters:  08/06/19 202 lb (91.6 kg)  07/07/19 201 lb (91.2 kg)  06/17/19 202 lb (91.6 kg)   Swallowing issues, if any: Occasionally. Per patient "If I'm not paying attention to what I'm doing, I get strangled easily". Smoking or chewing tobacco? None Using fluoride trays daily? Has not used since active treatment.  Last ENT visit was on: 01/26/2019 Saw Dr. Francina Ames: "There is no evidence of disease on exam today. He sees Dr Isidore Moos in May and Dr Marin Olp in February. I will see him back in July."  Other notable issues, if any:  Had to have laparoscopic cholecystectomy on 03/23/2019 for gangrenous cholecystitis with cholelithiasis (recovered well after). Was also found to be in a.fib during hospitalization, so he was placed on a heart monitor for a month. F/U with Dr. Burney Gauze on 07/24/2019. Was hospitalized at the end of March for elevated LFTs and possible biliary duct obstruction (had MRCP and was signed off by GI). Continues to follow-up with cardiology; saw Dr. Crissie Sickles on 07/07/2019: "Tachy-brady syndrome - he is not symptomatic although he has criteria for PPM insertion. I discussed in detail the symptoms he might experience or to watch out for with regard to his brady arrhythmias and he will call us if he develops any symptoms"  Vitals:   08/06/19 1048  BP: 138/85  Pulse: 68  Resp: 20  Temp: (!) 97.2 F (36.2 C)  SpO2: 97%

## 2019-08-06 ENCOUNTER — Ambulatory Visit
Admission: RE | Admit: 2019-08-06 | Discharge: 2019-08-06 | Disposition: A | Discharge status: Home / Self Care | Payer: Medicare Other | Source: Ambulatory Visit | Attending: Radiation Oncology | Admitting: Radiation Oncology

## 2019-08-06 ENCOUNTER — Encounter: Payer: Self-pay | Admitting: Radiation Oncology

## 2019-08-06 ENCOUNTER — Other Ambulatory Visit: Payer: Self-pay

## 2019-08-06 VITALS — BP 138/85 | HR 68 | Temp 97.2°F | Resp 20 | Ht 72.0 in | Wt 202.0 lb

## 2019-08-06 DIAGNOSIS — Z08 Encounter for follow-up examination after completed treatment for malignant neoplasm: Secondary | ICD-10-CM | POA: Diagnosis not present

## 2019-08-06 DIAGNOSIS — Z79899 Other long term (current) drug therapy: Secondary | ICD-10-CM | POA: Diagnosis not present

## 2019-08-06 DIAGNOSIS — Z85819 Personal history of malignant neoplasm of unspecified site of lip, oral cavity, and pharynx: Secondary | ICD-10-CM | POA: Insufficient documentation

## 2019-08-06 DIAGNOSIS — Z923 Personal history of irradiation: Secondary | ICD-10-CM | POA: Insufficient documentation

## 2019-08-06 DIAGNOSIS — Z7982 Long term (current) use of aspirin: Secondary | ICD-10-CM | POA: Diagnosis not present

## 2019-08-06 DIAGNOSIS — C099 Malignant neoplasm of tonsil, unspecified: Secondary | ICD-10-CM

## 2019-08-06 DIAGNOSIS — Z85818 Personal history of malignant neoplasm of other sites of lip, oral cavity, and pharynx: Secondary | ICD-10-CM | POA: Diagnosis not present

## 2019-08-10 ENCOUNTER — Telehealth: Payer: Self-pay | Admitting: Medical

## 2019-08-10 ENCOUNTER — Encounter: Payer: Self-pay | Admitting: Radiation Oncology

## 2019-08-10 NOTE — Telephone Encounter (Signed)
Scheduled appt per 6/1 sch message- mailed reminder letter with appt date and time

## 2019-08-10 NOTE — Progress Notes (Signed)
Radiation Oncology         [phone]) [phone] ________________________________  Name: Randy Hanson MRN: [id number]  Date: 08/06/2019  DOB: November 23, 1940  Follow-Up Visit Note - patient seen face to face today. OUTPATIENT   CC: Randy Noble, MD  Randy Noble, MD  Diagnosis and Prior Radiotherapy:       ICD-10-CM   1. 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 The Right tonsil and bilateral neck were treated PTV High to 70 Gy in 35 fractions at 2 Gy per fraction. 2. The Right tonsil and bilateral neck were treated PTV Med to 63 Gy in 35 fractions at 1.8 Gy per fraction. 3. The Right tonsil and bilateral neck were treated PTV Low to 56 Gy in 35 fractions at 1.6 Gy per fraction.  Chief Complaint: Follow up of tonsillar cancer  Narrative:  The patient returns today for routine follow-up of radiation completed 10/16/15 to his Right Tonsil and bilateral neck.    Randy Hanson presents for follow up of radiation completed 10/16/2015 to his right tonsil and bilateral neck.   Pain issues, if any: Patient denies Using a feeding tube?: N/A Weight changes, if any:  Wt Readings from Last 3 Encounters:  08/06/19 202 lb (91.6 kg)  07/07/19 201 lb (91.2 kg)  06/17/19 202 lb (91.6 kg)   Swallowing issues, if any: Occasionally. Per patient "If I'm not paying attention to what I'm doing, I get strangled easily". Smoking or chewing tobacco? None Using fluoride trays daily? Has not used since active treatment.  Last ENT visit was on: 01/26/2019 Saw Dr. Corey Skains: "There is no evidence of disease on exam today. He sees Dr Basilio Cairo in May and Dr Myna Hidalgo in February. I will see him back in July."  Other notable issues, if any:  Had to have laparoscopic cholecystectomy on 03/23/2019 for gangrenous cholecystitis with cholelithiasis (recovered well after). Was also found to be  in a.fib during hospitalization, so he was placed on a heart monitor for a month. F/U with Dr. Arlan Organ on 07/24/2019. Was hospitalized at the end of March for elevated LFTs and possible biliary duct obstruction (had MRCP and was signed off by GI). Continues to follow-up with cardiology; saw Dr. Sharrell Ku on 07/07/2019: "Tachy-brady syndrome - he is not symptomatic although he has criteria for PPM insertion. I discussed in detail the symptoms he might experience or to watch out for with regard to his brady arrhythmias and he will call us if he develops any symptoms"   ALLERGIES:  is allergic to lisinopril; acyclovir and related; and famciclovir.  Meds: Current Outpatient Medications  Medication Sig Dispense Refill  . apixaban (ELIQUIS) 5 MG TABS tablet Take 1 tablet (5 mg total) by mouth 2 (two) times daily. 60 tablet 11  . aspirin EC 81 MG tablet Take 81 mg by mouth daily.    . chlorhexidine (PERIDEX) 0.12 % solution Use as directed 15 mLs in the mouth or throat 2 (two) times daily. Mouth rinse    . Cholecalciferol (VITAMIN D) 50 MCG (2000 UT) tablet Take 2,000 Units by mouth daily.    . diphenhydrAMINE (BENADRYL) 25 MG tablet Take 25 mg by mouth at bedtime.    . folic acid (FOLVITE) 1 MG tablet Take 1 mg by mouth daily.     Marland Kitchen oxyCODONE (OXY IR/ROXICODONE) 5 MG  immediate release tablet Take 1 tablet (5 mg total) by mouth every 6 (six) hours as needed for moderate pain. 20 tablet 0  . polyethylene glycol (MIRALAX / GLYCOLAX) 17 g packet Take 17 g by mouth daily as needed for mild constipation. 14 each 0  . predniSONE (DELTASONE) 5 MG tablet Take 5 mg by mouth daily with breakfast.    . sennosides-docusate sodium (SENOKOT-S) 8.6-50 MG tablet Take 1 tablet by mouth daily as needed for constipation.    . tacrolimus (PROGRAF) 1 MG capsule Take 1 mg by mouth 2 (two) times daily.    Marland Kitchen terazosin (HYTRIN) 2 MG capsule Take 2 mg by mouth 2 (two) times daily.     . vitamin B-12 (CYANOCOBALAMIN) 1000  MCG tablet Take 1,000 mcg by mouth daily. Chewable     No current facility-administered medications for this encounter.   Facility-Administered Medications Ordered in Other Encounters  Medication Dose Route Frequency Provider Last Rate Last Admin  . regadenoson (LEXISCAN) injection SOLN 0.4 mg  0.4 mg Intravenous Once Croitoru, Mihai, MD        Physical Findings: The patient is in no acute distress. Patient is alert and oriented. Wt Readings from Last 3 Encounters:  08/06/19 202 lb (91.6 kg)  07/07/19 201 lb (91.2 kg)  06/17/19 202 lb (91.6 kg)    height is 6' (1.829 m) and weight is 202 lb (91.6 kg). His temperature is 97.2 F (36.2 C) (abnormal). His blood pressure is 138/85 and his pulse is 68. His respiration is 20 and oxygen saturation is 97%. .  General: Alert and oriented, in no acute distress. HEENT:  No evidence of tumor in the oropharynx or mouth Neck: No palpable cervical or supraclavicular adenopathy  Skin : healed well over neck, intact Psychiatric: Judgment and insight are intact. Affect is appropriate.  Lab Findings: Lab Results  Component Value Date   WBC 6.7 06/04/2019   HGB 14.3 06/04/2019   HCT 42.4 06/04/2019   MCV 97.7 06/04/2019   PLT 205 06/04/2019    Lab Results  Component Value Date   TSH 1.878 04/26/2019    Radiographic Findings: No results found.  Impression/Plan:    1) Head and Neck Cancer Status: NED   2) Nutritional Status: no issues Wt Readings from Last 3 Encounters:  08/06/19 202 lb (91.6 kg)  07/07/19 201 lb (91.2 kg)  06/17/19 202 lb (91.6 kg)     3) Risk Factors: The patient has been educated about risk factors including alcohol and tobacco abuse; they understand that avoidance of alcohol and tobacco is important to prevent recurrences as well as other cancers.   He knows to avoid mouthwash with alcohol, try ACT MW with fluoride instead.  4) Swallowing: Continue swallowing exercises as advised by speech-language pathology; he  is able to eat most foods.  5) Dental: continue followup with dentistry as finances allow, and dental hygiene including flossing, brushing, and fluoride rinses.   6) Thyroid function: TSH WNL -he knows to continue to check this annually at the cancer center or through his PCP. Lab Results  Component Value Date   TSH 1.878 04/26/2019    7) He is 4 years out from chemoradiotherapy. I offered him a referral to our survivorship clinic for head and neck patients.  He will see otolaryngology and medical oncology for follow-ups over the next several months and see Marga Hoots of head and neck survivorship in 12 months.  I will see him back as an as needed  basis.  He knows to call if he has any questions or concerns.  I wished him the best.  On date of service, in total, I spent 20 minutes on this encounter.  He was seen in person.  ______________________________________   Lonie Peak, MD

## 2019-10-05 DIAGNOSIS — C099 Malignant neoplasm of tonsil, unspecified: Secondary | ICD-10-CM | POA: Diagnosis not present

## 2019-10-25 ENCOUNTER — Inpatient Hospital Stay: Payer: Medicare Other | Attending: Hematology & Oncology

## 2019-10-25 ENCOUNTER — Encounter: Payer: Self-pay | Admitting: Hematology & Oncology

## 2019-10-25 ENCOUNTER — Telehealth: Payer: Self-pay | Admitting: Hematology & Oncology

## 2019-10-25 ENCOUNTER — Other Ambulatory Visit: Payer: Self-pay

## 2019-10-25 ENCOUNTER — Inpatient Hospital Stay (HOSPITAL_BASED_OUTPATIENT_CLINIC_OR_DEPARTMENT_OTHER): Payer: Medicare Other | Admitting: Hematology & Oncology

## 2019-10-25 VITALS — BP 142/80 | HR 74 | Temp 98.3°F | Resp 18 | Wt 200.0 lb

## 2019-10-25 DIAGNOSIS — Z9221 Personal history of antineoplastic chemotherapy: Secondary | ICD-10-CM | POA: Diagnosis not present

## 2019-10-25 DIAGNOSIS — Z85818 Personal history of malignant neoplasm of other sites of lip, oral cavity, and pharynx: Secondary | ICD-10-CM | POA: Diagnosis not present

## 2019-10-25 DIAGNOSIS — Z7901 Long term (current) use of anticoagulants: Secondary | ICD-10-CM | POA: Diagnosis not present

## 2019-10-25 DIAGNOSIS — I480 Paroxysmal atrial fibrillation: Secondary | ICD-10-CM

## 2019-10-25 DIAGNOSIS — I4891 Unspecified atrial fibrillation: Secondary | ICD-10-CM | POA: Diagnosis not present

## 2019-10-25 DIAGNOSIS — C099 Malignant neoplasm of tonsil, unspecified: Secondary | ICD-10-CM

## 2019-10-25 DIAGNOSIS — Z923 Personal history of irradiation: Secondary | ICD-10-CM

## 2019-10-25 LAB — CBC WITH DIFFERENTIAL (CANCER CENTER ONLY)
Abs Immature Granulocytes: 0.03 10*3/uL (ref 0.00–0.07)
Basophils Absolute: 0 10*3/uL (ref 0.0–0.1)
Basophils Relative: 1 %
Eosinophils Absolute: 0.1 10*3/uL (ref 0.0–0.5)
Eosinophils Relative: 1 %
HCT: 42.8 % (ref 39.0–52.0)
Hemoglobin: 14.5 g/dL (ref 13.0–17.0)
Immature Granulocytes: 0 %
Lymphocytes Relative: 8 %
Lymphs Abs: 0.6 10*3/uL — ABNORMAL LOW (ref 0.7–4.0)
MCH: 33.1 pg (ref 26.0–34.0)
MCHC: 33.9 g/dL (ref 30.0–36.0)
MCV: 97.7 fL (ref 80.0–100.0)
Monocytes Absolute: 0.6 10*3/uL (ref 0.1–1.0)
Monocytes Relative: 8 %
Neutro Abs: 6.3 10*3/uL (ref 1.7–7.7)
Neutrophils Relative %: 82 %
Platelet Count: 190 10*3/uL (ref 150–400)
RBC: 4.38 MIL/uL (ref 4.22–5.81)
RDW: 13.7 % (ref 11.5–15.5)
WBC Count: 7.6 10*3/uL (ref 4.0–10.5)
nRBC: 0 % (ref 0.0–0.2)

## 2019-10-25 LAB — CMP (CANCER CENTER ONLY)
ALT: 14 U/L (ref 0–44)
AST: 19 U/L (ref 15–41)
Albumin: 4.2 g/dL (ref 3.5–5.0)
Alkaline Phosphatase: 49 U/L (ref 38–126)
Anion gap: 5 (ref 5–15)
BUN: 19 mg/dL (ref 8–23)
CO2: 33 mmol/L — ABNORMAL HIGH (ref 22–32)
Calcium: 10.1 mg/dL (ref 8.9–10.3)
Chloride: 104 mmol/L (ref 98–111)
Creatinine: 1.2 mg/dL (ref 0.61–1.24)
GFR, Est AFR Am: >60 mL/min (ref >60)
GFR, Estimated: 58 mL/min — ABNORMAL LOW (ref >60)
Glucose, Bld: 126 mg/dL — ABNORMAL HIGH (ref 70–99)
Potassium: 4.6 mmol/L (ref 3.5–5.1)
Sodium: 142 mmol/L (ref 135–145)
Total Bilirubin: 0.7 mg/dL (ref 0.3–1.2)
Total Protein: 6.1 g/dL — ABNORMAL LOW (ref 6.5–8.1)

## 2019-10-25 LAB — TSH: TSH: 1.103 u[IU]/mL (ref 0.320–4.118)

## 2019-10-25 NOTE — Telephone Encounter (Signed)
Appointments scheduled calendar printed  Per 8/16 los

## 2019-10-25 NOTE — Progress Notes (Signed)
Hematology and Oncology Follow Up Visit  Randy Hanson [id number] Sep 01, 1940 79 y.o. 10/25/2019   Principle Diagnosis:  Stage I (T1N1M0) - HPV + - Squamous cell ca of right tonsil  Current Therapy:   Cisplatin s/p cycle #7 - completed Radiation therapy s/p  - completed 10/16/2015    Interim History:  Randy Hanson is here today for a follow-up.  Thankfully, his wife comes in with him.  Randy Hanson has been doing pretty well.  They just got back from [facility].  Randy Hanson was down with a whole family.  A third grandchild will be coming in November.  This is very exciting news.  Randy Hanson has had no problems with nausea or vomiting.  Randy Hanson had emergency cholecystectomy back in January.  There has been no problems with fever.  Randy Hanson has had of the coronavirus vaccine.  There has been no problems with rashes.  Randy Hanson unfortunately did get stung about 20 times by yellow jackets.  Randy Hanson disturbed their nest yesterday and they went out and attacked him.  Thankfully, Randy Hanson is not allergic to them.  Randy Hanson has had no problems swallowing.  There is been no dysphagia or odynophagia.  Randy Hanson has had no headache.  Randy Hanson is still working.  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  Overall, his performance status ECOG 1.    Randy Hanson does have the atrial fibrillation.  Randy Hanson is on Eliquis for this.     Medications:  Allergies as of 10/25/2019      Reactions   Lisinopril Cough   Acyclovir And Related Other (See Comments)   Fever, chills   Famciclovir Other (See Comments)   Fever, chills      Medication List       Accurate as of October 25, 2019 11:07 AM. If you have any questions, ask your nurse or doctor.        apixaban 5 MG Tabs tablet Commonly known as: Eliquis Take 1 tablet (5 mg total) by mouth 2 (two) times daily.   aspirin EC 81 MG tablet Take 81 mg by mouth daily.   chlorhexidine 0.12 % solution Commonly known as: PERIDEX Use as directed 15 mLs in the mouth or throat 2 (two) times daily. Mouth rinse   diphenhydrAMINE 25 MG  tablet Commonly known as: BENADRYL Take 25 mg by mouth at bedtime.   folic acid 1 MG tablet Commonly known as: FOLVITE Take 1 mg by mouth daily.   oxyCODONE 5 MG immediate release tablet Commonly known as: Oxy IR/ROXICODONE Take 1 tablet (5 mg total) by mouth every 6 (six) hours as needed for moderate pain.   polyethylene glycol 17 g packet Commonly known as: MIRALAX / GLYCOLAX Take 17 g by mouth daily as needed for mild constipation.   predniSONE 5 MG tablet Commonly known as: DELTASONE Take 5 mg by mouth daily with breakfast.   sennosides-docusate sodium 8.6-50 MG tablet Commonly known as: SENOKOT-S Take 1 tablet by mouth daily as needed for constipation.   tacrolimus 1 MG capsule Commonly known as: PROGRAF Take 1 mg by mouth 2 (two) times daily.   terazosin 2 MG capsule Commonly known as: HYTRIN Take 2 mg by mouth 2 (two) times daily.   vitamin B-12 1000 MCG tablet Commonly known as: CYANOCOBALAMIN Take 1,000 mcg by mouth daily. Chewable   Vitamin D 50 MCG (2000 UT) tablet Take 2,000 Units by mouth daily.       Allergies:  Allergies  Allergen Reactions  . Lisinopril Cough  .  Acyclovir And Related Other (See Comments)    Fever, chills   . Famciclovir Other (See Comments)    Fever, chills    Past Medical History, Surgical history, Social history, and Family History were reviewed and updated.  Review of Systems: Review of Systems  Constitutional: Negative.   HENT: Negative.   Eyes: Negative.   Respiratory: Negative.   Cardiovascular: Negative.   Gastrointestinal: Negative.   Genitourinary: Negative.   Musculoskeletal: Negative.   Skin: Negative.   Neurological: Negative.   Endo/Heme/Allergies: Negative.   Psychiatric/Behavioral: Negative.      Physical Exam:  weight is 200 lb (90.7 kg). His oral temperature is 98.3 F (36.8 C). His blood pressure is 142/80 (abnormal) and his pulse is 74. His respiration is 18 and oxygen saturation is 99%.    Wt Readings from Last 3 Encounters:  10/25/19 200 lb (90.7 kg)  08/06/19 202 lb (91.6 kg)  07/07/19 201 lb (91.2 kg)    Physical Exam Vitals reviewed.  HENT:     Head: Normocephalic and atraumatic.  Eyes:     Pupils: Pupils are equal, round, and reactive to light.  Cardiovascular:     Rate and Rhythm: Normal rate and regular rhythm.     Heart sounds: Normal heart sounds.  Pulmonary:     Effort: Pulmonary effort is normal.     Breath sounds: Normal breath sounds.  Abdominal:     General: Bowel sounds are normal.     Palpations: Abdomen is soft.     Comments: Abdominal exam shows a soft abdomen.  Randy Hanson has well-healed laparoscopic scars.  Randy Hanson has decent bowel sounds.  There is no guarding or rebound tenderness.  Musculoskeletal:        General: No tenderness or deformity. Normal range of motion.     Cervical back: Normal range of motion.  Lymphadenopathy:     Cervical: No cervical adenopathy.  Skin:    General: Skin is warm and dry.     Findings: No erythema or rash.  Neurological:     Mental Status: Randy Hanson is alert and oriented to person, place, and time.  Psychiatric:        Behavior: Behavior normal.        Thought Content: Thought content normal.        Judgment: Judgment normal.      Lab Results  Component Value Date   WBC 7.6 10/25/2019   HGB 14.5 10/25/2019   HCT 42.8 10/25/2019   MCV 97.7 10/25/2019   PLT 190 10/25/2019   No results found for: FERRITIN, IRON, TIBC, UIBC, IRONPCTSAT Lab Results  Component Value Date   RBC 4.38 10/25/2019   No results found for: KPAFRELGTCHN, LAMBDASER, KAPLAMBRATIO No results found for: IGGSERUM, IGA, IGMSERUM No results found for: Marda Stalker, SPEI   Chemistry      Component Value Date/Time   NA 142 10/25/2019 1007   NA 146 (H) 02/20/2017 1007   NA 140 07/04/2016 1014   K 4.6 10/25/2019 1007   K 4.7 02/20/2017 1007   K 4.7 07/04/2016 1014   CL 104 10/25/2019 1007    CL 104 02/20/2017 1007   CO2 33 (H) 10/25/2019 1007   CO2 31 02/20/2017 1007   CO2 27 07/04/2016 1014   BUN 19 10/25/2019 1007   BUN 14 02/20/2017 1007   BUN 22.1 07/04/2016 1014   CREATININE 1.20 10/25/2019 1007   CREATININE 1.1 02/20/2017 1007   CREATININE 1.0 07/04/2016  1014      Component Value Date/Time   CALCIUM 10.1 10/25/2019 1007   CALCIUM 9.8 02/20/2017 1007   CALCIUM 10.6 (H) 07/04/2016 1014   ALKPHOS 49 10/25/2019 1007   ALKPHOS 72 02/20/2017 1007   ALKPHOS 255 (H) 07/04/2016 1014   AST 19 10/25/2019 1007   AST 28 07/04/2016 1014   ALT 14 10/25/2019 1007   ALT 17 02/20/2017 1007   ALT 55 07/04/2016 1014   BILITOT 0.7 10/25/2019 1007   BILITOT 0.80 07/04/2016 1014     Impression and Plan: Mr. Younkins is a very pleasant 65 sure I have toyo white male with stage I  HPV+ - squamous cell carcinoma the right tonsil. Randy Hanson completed radiation and chemotherapy in early August, 2017.   I see no evidence of recurrence of his malignancy.  Thankfully, Randy Hanson still doing quite well.  Is now been 4 years since Randy Hanson finished his treatments.  Randy Hanson has recovered nicely.  It was a long recovery but Randy Hanson has recovered fully.  I do not see a need for any scans on him at the present time.  We will plan for another 79-month follow-up.    [name], MD 8/16/202111:07 AM

## 2020-01-13 DIAGNOSIS — N401 Enlarged prostate with lower urinary tract symptoms: Secondary | ICD-10-CM | POA: Diagnosis not present

## 2020-01-13 DIAGNOSIS — K219 Gastro-esophageal reflux disease without esophagitis: Secondary | ICD-10-CM | POA: Diagnosis not present

## 2020-01-13 DIAGNOSIS — R351 Nocturia: Secondary | ICD-10-CM | POA: Diagnosis not present

## 2020-01-13 DIAGNOSIS — M549 Dorsalgia, unspecified: Secondary | ICD-10-CM | POA: Diagnosis not present

## 2020-01-13 DIAGNOSIS — I10 Essential (primary) hypertension: Secondary | ICD-10-CM | POA: Diagnosis not present

## 2020-01-13 DIAGNOSIS — E538 Deficiency of other specified B group vitamins: Secondary | ICD-10-CM | POA: Diagnosis not present

## 2020-01-13 DIAGNOSIS — R7309 Other abnormal glucose: Secondary | ICD-10-CM | POA: Diagnosis not present

## 2020-01-13 DIAGNOSIS — Z1389 Encounter for screening for other disorder: Secondary | ICD-10-CM | POA: Diagnosis not present

## 2020-01-13 DIAGNOSIS — Z7189 Other specified counseling: Secondary | ICD-10-CM | POA: Diagnosis not present

## 2020-01-13 DIAGNOSIS — Z0000 Encounter for general adult medical examination without abnormal findings: Secondary | ICD-10-CM | POA: Diagnosis not present

## 2020-01-13 DIAGNOSIS — C099 Malignant neoplasm of tonsil, unspecified: Secondary | ICD-10-CM | POA: Diagnosis not present

## 2020-01-13 DIAGNOSIS — E559 Vitamin D deficiency, unspecified: Secondary | ICD-10-CM | POA: Diagnosis not present

## 2020-01-13 DIAGNOSIS — R739 Hyperglycemia, unspecified: Secondary | ICD-10-CM | POA: Diagnosis not present

## 2020-02-16 DIAGNOSIS — R319 Hematuria, unspecified: Secondary | ICD-10-CM | POA: Diagnosis not present

## 2020-04-26 ENCOUNTER — Telehealth: Payer: Self-pay

## 2020-04-26 ENCOUNTER — Encounter: Payer: Self-pay | Admitting: Hematology & Oncology

## 2020-04-26 ENCOUNTER — Other Ambulatory Visit: Payer: Self-pay

## 2020-04-26 ENCOUNTER — Inpatient Hospital Stay: Payer: Medicare Other | Attending: Hematology & Oncology

## 2020-04-26 ENCOUNTER — Inpatient Hospital Stay (HOSPITAL_BASED_OUTPATIENT_CLINIC_OR_DEPARTMENT_OTHER): Payer: Medicare Other | Admitting: Hematology & Oncology

## 2020-04-26 VITALS — BP 143/74 | HR 67 | Temp 98.0°F | Resp 17 | Wt 205.0 lb

## 2020-04-26 DIAGNOSIS — I4891 Unspecified atrial fibrillation: Secondary | ICD-10-CM | POA: Insufficient documentation

## 2020-04-26 DIAGNOSIS — I480 Paroxysmal atrial fibrillation: Secondary | ICD-10-CM

## 2020-04-26 DIAGNOSIS — Z7901 Long term (current) use of anticoagulants: Secondary | ICD-10-CM | POA: Diagnosis not present

## 2020-04-26 DIAGNOSIS — Z85818 Personal history of malignant neoplasm of other sites of lip, oral cavity, and pharynx: Secondary | ICD-10-CM | POA: Diagnosis not present

## 2020-04-26 DIAGNOSIS — C099 Malignant neoplasm of tonsil, unspecified: Secondary | ICD-10-CM | POA: Diagnosis not present

## 2020-04-26 DIAGNOSIS — Z923 Personal history of irradiation: Secondary | ICD-10-CM | POA: Insufficient documentation

## 2020-04-26 DIAGNOSIS — Z9221 Personal history of antineoplastic chemotherapy: Secondary | ICD-10-CM | POA: Insufficient documentation

## 2020-04-26 LAB — TSH: TSH: 1.536 u[IU]/mL (ref 0.320–4.118)

## 2020-04-26 LAB — CBC WITH DIFFERENTIAL (CANCER CENTER ONLY)
Abs Immature Granulocytes: 0.03 10*3/uL (ref 0.00–0.07)
Basophils Absolute: 0 10*3/uL (ref 0.0–0.1)
Basophils Relative: 0 %
Eosinophils Absolute: 0.1 10*3/uL (ref 0.0–0.5)
Eosinophils Relative: 1 %
HCT: 42 % (ref 39.0–52.0)
Hemoglobin: 14.3 g/dL (ref 13.0–17.0)
Immature Granulocytes: 0 %
Lymphocytes Relative: 7 %
Lymphs Abs: 0.6 10*3/uL — ABNORMAL LOW (ref 0.7–4.0)
MCH: 32.9 pg (ref 26.0–34.0)
MCHC: 34 g/dL (ref 30.0–36.0)
MCV: 96.8 fL (ref 80.0–100.0)
Monocytes Absolute: 0.7 10*3/uL (ref 0.1–1.0)
Monocytes Relative: 9 %
Neutro Abs: 6.3 10*3/uL (ref 1.7–7.7)
Neutrophils Relative %: 83 %
Platelet Count: 183 10*3/uL (ref 150–400)
RBC: 4.34 MIL/uL (ref 4.22–5.81)
RDW: 13.2 % (ref 11.5–15.5)
WBC Count: 7.7 10*3/uL (ref 4.0–10.5)
nRBC: 0 % (ref 0.0–0.2)

## 2020-04-26 LAB — CMP (CANCER CENTER ONLY)
ALT: 17 U/L (ref 0–44)
AST: 18 U/L (ref 15–41)
Albumin: 4.2 g/dL (ref 3.5–5.0)
Alkaline Phosphatase: 49 U/L (ref 38–126)
Anion gap: 5 (ref 5–15)
BUN: 20 mg/dL (ref 8–23)
CO2: 31 mmol/L (ref 22–32)
Calcium: 10 mg/dL (ref 8.9–10.3)
Chloride: 104 mmol/L (ref 98–111)
Creatinine: 1.15 mg/dL (ref 0.61–1.24)
GFR, Estimated: >60 mL/min (ref >60)
Glucose, Bld: 111 mg/dL — ABNORMAL HIGH (ref 70–99)
Potassium: 4.3 mmol/L (ref 3.5–5.1)
Sodium: 140 mmol/L (ref 135–145)
Total Bilirubin: 0.6 mg/dL (ref 0.3–1.2)
Total Protein: 6.2 g/dL — ABNORMAL LOW (ref 6.5–8.1)

## 2020-04-26 NOTE — Progress Notes (Signed)
Hematology and Oncology Follow Up Visit  Randy Hanson [id number] 1940/11/27 80 y.o. 04/26/2020   Principle Diagnosis:  Stage I (T1N1M0) - HPV + - Squamous cell ca of right tonsil  Current Therapy:   Cisplatin s/p cycle #7 - completed Radiation therapy s/p  - completed 10/16/2015    Interim History:  Randy Hanson is here today for a follow-up.  As always, he is doing quite well.  He really has no specific complaints.  He comes in with his wife.  They had been doing quite well since we last saw him 6 months ago.  That she went to the [employer].  This was then October.  This all the light houses.  They just had another great grandchild.  It was a great grandson.  He is 56 weeks old.  He has had no problems with respect to the emergency cholecystectomy that he had done about a year ago.  He deftly got through that without problems.  He is eating okay.  He is having no problems with nausea or vomiting.  Is having no issues with bleeding.  He has had no rashes.  He has had no cough or shortness of breath.  Overall, his performance status is ECOG 1.   Medications:  Allergies as of 04/26/2020      Reactions   Lisinopril Cough   Acyclovir And Related Other (See Comments)   Fever, chills   Famciclovir Other (See Comments)   Fever, chills   Simvastatin Other (See Comments)      Medication List       Accurate as of April 26, 2020 11:18 AM. If you have any questions, ask your nurse or doctor.        STOP taking these medications   oxyCODONE 5 MG immediate release tablet Commonly known as: Oxy IR/ROXICODONE Stopped by: [name], MD   polyethylene glycol 17 g packet Commonly known as: MIRALAX / GLYCOLAX Stopped by: [name], MD   sennosides-docusate sodium 8.6-50 MG tablet Commonly known as: SENOKOT-S Stopped by: [name], MD   tacrolimus 1 MG capsule Commonly known as: PROGRAF Stopped by: [name], MD     TAKE these medications   apixaban 5  MG Tabs tablet Commonly known as: Eliquis Take 1 tablet (5 mg total) by mouth 2 (two) times daily.   aspirin EC 81 MG tablet Take 81 mg by mouth daily.   chlorhexidine 0.12 % solution Commonly known as: PERIDEX Use as directed 15 mLs in the mouth or throat 2 (two) times daily. Mouth rinse   diphenhydrAMINE 25 MG tablet Commonly known as: BENADRYL Take 25 mg by mouth at bedtime.   folic acid 1 MG tablet Commonly known as: FOLVITE Take 1 mg by mouth daily.   predniSONE 10 MG tablet Commonly known as: DELTASONE Take 10 mg by mouth daily. What changed: Another medication with the same name was removed. Continue taking this medication, and follow the directions you see here. Changed by: [name], MD   terazosin 2 MG capsule Commonly known as: HYTRIN Take 2 mg by mouth 2 (two) times daily.   vitamin B-12 1000 MCG tablet Commonly known as: CYANOCOBALAMIN Take 1,000 mcg by mouth daily. Chewable   Vitamin D 50 MCG (2000 UT) tablet Take 2,000 Units by mouth daily.       Allergies:  Allergies  Allergen Reactions  . Lisinopril Cough  . Acyclovir And Related Other (See Comments)    Fever,  chills   . Famciclovir Other (See Comments)    Fever, chills  . Simvastatin Other (See Comments)    Past Medical History, Surgical history, Social history, and Family History were reviewed and updated.  Review of Systems: Review of Systems  Constitutional: Negative.   HENT: Negative.   Eyes: Negative.   Respiratory: Negative.   Cardiovascular: Negative.   Gastrointestinal: Negative.   Genitourinary: Negative.   Musculoskeletal: Negative.   Skin: Negative.   Neurological: Negative.   Endo/Heme/Allergies: Negative.   Psychiatric/Behavioral: Negative.      Physical Exam:  weight is 205 lb (93 kg). His oral temperature is 98 F (36.7 C). His blood pressure is 143/74 (abnormal) and his pulse is 67. His respiration is 17 and oxygen saturation is 98%.   Wt Readings from  Last 3 Encounters:  04/26/20 205 lb (93 kg)  10/25/19 200 lb (90.7 kg)  08/06/19 202 lb (91.6 kg)    Physical Exam Vitals reviewed.  HENT:     Head: Normocephalic and atraumatic.  Eyes:     Pupils: Pupils are equal, round, and reactive to light.  Cardiovascular:     Rate and Rhythm: Normal rate and regular rhythm.     Heart sounds: Normal heart sounds.  Pulmonary:     Effort: Pulmonary effort is normal.     Breath sounds: Normal breath sounds.  Abdominal:     General: Bowel sounds are normal.     Palpations: Abdomen is soft.     Comments: Abdominal exam shows a soft abdomen.  He has well-healed laparoscopic scars.  He has decent bowel sounds.  There is no guarding or rebound tenderness.  Musculoskeletal:        General: No tenderness or deformity. Normal range of motion.     Cervical back: Normal range of motion.  Lymphadenopathy:     Cervical: No cervical adenopathy.  Skin:    General: Skin is warm and dry.     Findings: No erythema or rash.  Neurological:     Mental Status: He is alert and oriented to person, place, and time.  Psychiatric:        Behavior: Behavior normal.        Thought Content: Thought content normal.        Judgment: Judgment normal.      Lab Results  Component Value Date   WBC 7.7 04/26/2020   HGB 14.3 04/26/2020   HCT 42.0 04/26/2020   MCV 96.8 04/26/2020   PLT 183 04/26/2020   No results found for: FERRITIN, IRON, TIBC, UIBC, IRONPCTSAT Lab Results  Component Value Date   RBC 4.34 04/26/2020   No results found for: KPAFRELGTCHN, LAMBDASER, KAPLAMBRATIO No results found for: IGGSERUM, IGA, IGMSERUM No results found for: Marda Stalker, SPEI   Chemistry      Component Value Date/Time   NA 140 04/26/2020 0958   NA 146 (H) 02/20/2017 1007   NA 140 07/04/2016 1014   K 4.3 04/26/2020 0958   K 4.7 02/20/2017 1007   K 4.7 07/04/2016 1014   CL 104 04/26/2020 0958   CL 104 02/20/2017  1007   CO2 31 04/26/2020 0958   CO2 31 02/20/2017 1007   CO2 27 07/04/2016 1014   BUN 20 04/26/2020 0958   BUN 14 02/20/2017 1007   BUN 22.1 07/04/2016 1014   CREATININE 1.15 04/26/2020 0958   CREATININE 1.1 02/20/2017 1007   CREATININE 1.0 07/04/2016 1014  Component Value Date/Time   CALCIUM 10.0 04/26/2020 0958   CALCIUM 9.8 02/20/2017 1007   CALCIUM 10.6 (H) 07/04/2016 1014   ALKPHOS 49 04/26/2020 0958   ALKPHOS 72 02/20/2017 1007   ALKPHOS 255 (H) 07/04/2016 1014   AST 18 04/26/2020 0958   AST 28 07/04/2016 1014   ALT 17 04/26/2020 0958   ALT 17 02/20/2017 1007   ALT 55 07/04/2016 1014   BILITOT 0.6 04/26/2020 0958   BILITOT 0.80 07/04/2016 1014     Impression and Plan: Mr. Hafeman is a very pleasant 26 sure I have toyo white male with stage I  HPV+ - squamous cell carcinoma the right tonsil. He completed radiation and chemotherapy in early August, 2017.   I see no evidence of recurrence of his malignancy.  He is now almost 5 years out.  Once we get to 5 years, we will then see him back yearly.  I am just glad that his quality of life is doing well.   [name], MD 2/16/202211:18 AM

## 2020-04-26 NOTE — Telephone Encounter (Signed)
appts made and printed for pt per 04/26/20 los   Randy Hanson

## 2020-07-13 DIAGNOSIS — I480 Paroxysmal atrial fibrillation: Secondary | ICD-10-CM | POA: Diagnosis not present

## 2020-07-13 DIAGNOSIS — E559 Vitamin D deficiency, unspecified: Secondary | ICD-10-CM | POA: Diagnosis not present

## 2020-07-13 DIAGNOSIS — E538 Deficiency of other specified B group vitamins: Secondary | ICD-10-CM | POA: Diagnosis not present

## 2020-07-13 DIAGNOSIS — N401 Enlarged prostate with lower urinary tract symptoms: Secondary | ICD-10-CM | POA: Diagnosis not present

## 2020-07-13 DIAGNOSIS — C099 Malignant neoplasm of tonsil, unspecified: Secondary | ICD-10-CM | POA: Diagnosis not present

## 2020-07-13 DIAGNOSIS — I10 Essential (primary) hypertension: Secondary | ICD-10-CM | POA: Diagnosis not present

## 2020-07-13 DIAGNOSIS — K219 Gastro-esophageal reflux disease without esophagitis: Secondary | ICD-10-CM | POA: Diagnosis not present

## 2020-07-13 DIAGNOSIS — D6869 Other thrombophilia: Secondary | ICD-10-CM | POA: Diagnosis not present

## 2020-07-13 DIAGNOSIS — R319 Hematuria, unspecified: Secondary | ICD-10-CM | POA: Diagnosis not present

## 2020-07-13 DIAGNOSIS — R739 Hyperglycemia, unspecified: Secondary | ICD-10-CM | POA: Diagnosis not present

## 2020-08-09 ENCOUNTER — Encounter: Payer: Medicare Other | Admitting: Medical

## 2020-08-10 ENCOUNTER — Inpatient Hospital Stay: Payer: Medicare Other | Attending: Medical | Admitting: Nurse Practitioner

## 2020-08-10 NOTE — Progress Notes (Deleted)
[facility]   [phone] [phone]   Clinic Follow up Note   Patient Care Team: Josetta Huddle, MD as PCP - General (Internal Medicine) Josue Hector, MD as PCP - Cardiology (Cardiology) Evans Lance, MD as PCP - Electrophysiology (Cardiology) Eppie Gibson, MD as Attending Physician (Radiation Oncology) Karie Mainland, RD as Dietitian (Nutrition) 08/10/2020  CHIEF COMPLAINT: H/N survivorship clinic - tonsil cancer  SUMMARY OF ONCOLOGIC HISTORY: 1. 06/14/15 Biopsy right neck node FNA carcinoma, favor squamous cell, HPV16 + 2. 07/18/15 Staging work-up showed enlarged level 1-4 nodes in the right neck, PET scan negative for metastatic disease - T1 N2b M0 -stage I 3. 08/24/15 Port-A-Cath placement 4. 08/26/15 - 10/16/15 Definitive treatment with concurrent chemoradiation with cisplatin -The Right tonsil and bilateral neck were treated PTV High to 70 Gy in 35 fractions at 2 Gy per fraction. -The Right tonsil and bilateral neck were treated PTV Med to 63 Gy in 35 fractions at 1.8 Gy per fraction. -The Right tonsil and bilateral neck were treated PTV Low to 56 Gy in 35 fractions at 1.6 Gy per fraction. 5. 09/26/15 Pull-through G-tube placement  6. 02/08/2016 -regression of right sided palatine tonsil lesion and cervical adenopathy, positive response to therapy 7. 07/04/2016 no evidence of local recurrent or metastatic disease, NED 8. 07/22/2016 Port-A-Cath removed 9. 02/24/2017 G-tube removed 10.  01/2016- 03/2016 PT for neck lymphedema 11. Ongoing surveillance   Dr. Nicolette Bang f/up   CURRENT THERAPY: Surveillance  INTERVAL HISTORY:  Hoarseness Pain Dysphagia Bleeding LN/masses Cranial nerve deficits Weight loss Difficulty breathing Weight loss  Fatigue Cold intolerance  REVIEW OF SYSTEMS:   Constitutional: Denies fevers, chills or abnormal weight loss Eyes: Denies blurriness of vision Ears, nose, mouth, throat, and face: Denies mucositis or  sore throat Respiratory: Denies cough, dyspnea or wheezes Cardiovascular: Denies palpitation, chest discomfort or lower extremity swelling Gastrointestinal:  Denies nausea, heartburn or change in bowel habits Skin: Denies abnormal skin rashes Lymphatics: Denies new lymphadenopathy or easy bruising Neurological:Denies numbness, tingling or new weaknesses Behavioral/Psych: Mood is stable, no new changes  All other systems were reviewed with the patient and are negative.  MEDICAL HISTORY:  Past Medical History:  Diagnosis Date  . Back pain   . BPH (benign prostatic hyperplasia)   . Cancer of tonsil, palatine ([place]) 07/12/2015  . DJD (degenerative joint disease)   . Esophageal reflux   . FHx: colonic polyps   . Hemorrhoids   . History of radiation therapy 08/28/15-- 10/16/15   Right Tonsil and bilateral neck  . Hypercholesteremia   . Hyperlipemia   . Hypertension   . Kidney cysts    [facility] [place] [facility]2012  . Posterior vitreous detachment, left eye    DR. KATHERINE HECKER  AUGUST 2011  . Radiation 08/28/15- 10/16/15   Right Tonsil and Bilateral Neck    SURGICAL HISTORY: Past Surgical History:  Procedure Laterality Date  . CARDIAC CATHETERIZATION     remote >15 years ago  . CHOLECYSTECTOMY N/A 03/23/2019   Procedure: LAPAROSCOPIC CHOLECYSTECTOMY;  Surgeon: Coralie Keens, MD;  Location: [facility];  Service: General;  Laterality: N/A;  . IR GASTROSTOMY TUBE REMOVAL  02/24/2017  . IR GENERIC HISTORICAL  10/11/2015   IR PATIENT EVAL TECH 0-60 MINS 10/11/2015 Darrell K Allred, PA-C [facility]INTER[facility] RAD  . IR PATIENT EVAL TECH 0-60 MINS  07/05/2016  . IR PATIENT EVAL TECH 0-60 MINS  08/14/2016  . IR PATIENT EVAL TECH 0-60 MINS  12/09/2016  . IR REMOVAL TUN ACCESS W/ PORT W/O FL MOD SED  07/22/2016  . VASECTOMY  1975    I have reviewed the social history and family history with the patient and they are unchanged from previous note.  ALLERGIES:  is allergic to lisinopril, acyclovir  and related, famciclovir, and simvastatin.  MEDICATIONS:  Current Outpatient Medications  Medication Sig Dispense Refill  . apixaban (ELIQUIS) 5 MG TABS tablet Take 1 tablet (5 mg total) by mouth 2 (two) times daily. 60 tablet 11  . aspirin EC 81 MG tablet Take 81 mg by mouth daily.    . chlorhexidine (PERIDEX) 0.12 % solution Use as directed 15 mLs in the mouth or throat 2 (two) times daily. Mouth rinse    . Cholecalciferol (VITAMIN D) 50 MCG (2000 UT) tablet Take 2,000 Units by mouth daily.    . diphenhydrAMINE (BENADRYL) 25 MG tablet Take 25 mg by mouth at bedtime.    . folic acid (FOLVITE) 1 MG tablet Take 1 mg by mouth daily.     . predniSONE (DELTASONE) 10 MG tablet Take 10 mg by mouth daily.    Marland Kitchen terazosin (HYTRIN) 2 MG capsule Take 2 mg by mouth 2 (two) times daily.     . vitamin B-12 (CYANOCOBALAMIN) 1000 MCG tablet Take 1,000 mcg by mouth daily. Chewable     No current facility-administered medications for this visit.   Facility-Administered Medications Ordered in Other Visits  Medication Dose Route Frequency Provider Last Rate Last Admin  . regadenoson (LEXISCAN) injection SOLN 0.4 mg  0.4 mg Intravenous Once Croitoru, Mihai, MD        PHYSICAL EXAMINATION: ECOG PERFORMANCE STATUS: {CHL ONC ECOG WN:[id number]}  There were no vitals filed for this visit. There were no vitals filed for this visit.  GENERAL:alert, no distress and comfortable SKIN: skin color, texture, turgor are normal, no rashes or significant lesions EYES: normal, Conjunctiva are pink and non-injected, sclera clear OROPHARYNX:no exudate, no erythema and lips, buccal mucosa, and tongue normal  NECK: supple, thyroid normal size, non-tender, without nodularity LYMPH:  no palpable lymphadenopathy in the cervical, axillary or inguinal LUNGS: clear to auscultation and percussion with normal breathing effort HEART: regular rate & rhythm and no murmurs and no lower extremity edema ABDOMEN:abdomen soft,  non-tender and normal bowel sounds Musculoskeletal:no cyanosis of digits and no clubbing  NEURO: alert & oriented x 3 with fluent speech, no focal motor/sensory deficits  LABORATORY DATA:  I have reviewed the data as listed CBC Latest Ref Rng & Units 04/26/2020 10/25/2019 06/04/2019  WBC 4.0 - 10.5 K/uL 7.7 7.6 6.7  Hemoglobin 13.0 - 17.0 g/dL 14.3 14.5 14.3  Hematocrit 39.0 - 52.0 % 42.0 42.8 42.4  Platelets 150 - 400 K/uL 183 190 205     CMP Latest Ref Rng & Units 04/26/2020 10/25/2019 06/06/2019  Glucose 70 - 99 mg/dL 111(H) 126(H) 102(H)  BUN 8 - 23 mg/dL 20 19 13   Creatinine 0.61 - 1.24 mg/dL 1.15 1.20 0.97  Sodium 135 - 145 mmol/L 140 142 139  Potassium 3.5 - 5.1 mmol/L 4.3 4.6 3.8  Chloride 98 - 111 mmol/L 104 104 106  CO2 22 - 32 mmol/L 31 33(H) 24  Calcium 8.9 - 10.3 mg/dL 10.0 10.1 9.5  Total Protein 6.5 - 8.1 g/dL 6.2(L) 6.1(L) 5.9(L)  Total Bilirubin 0.3 - 1.2 mg/dL 0.6 0.7 1.7(H)  Alkaline Phos 38 - 126 U/L 49 49 126  AST 15 - 41 U/L 18 19 39  ALT  0 - 44 U/L 17 14 110(H)      RADIOGRAPHIC STUDIES: I have personally reviewed the radiological images as listed and agreed with the findings in the report. No results found.   ASSESSMENT & PLAN:   Surveillance Regular dental evaluation Thyroid testing No problem-specific Assessment & Plan notes found for this encounter.   No orders of the defined types were placed in this encounter.  All questions were answered. The patient knows to call the clinic with any problems, questions or concerns. No barriers to learning was detected. I spent {CHL ONC TIME VISIT - GXIVH:[id number]} counseling the patient face to face. The total time spent in the appointment was {CHL ONC TIME VISIT - OFPUL:[id number]} and more than 50% was on counseling and review of test results     Alla Feeling, NP 08/10/20

## 2020-08-11 ENCOUNTER — Telehealth: Payer: Self-pay | Admitting: Nurse Practitioner

## 2020-08-11 NOTE — Telephone Encounter (Signed)
Called pt to r/s appt per 6/2 sch msg. Spoke to pt's wife who said pt has decided not to r/s at this time. They will call back if they feel the appt is needed.

## 2020-10-26 ENCOUNTER — Inpatient Hospital Stay: Payer: Medicare Other

## 2020-10-26 ENCOUNTER — Inpatient Hospital Stay: Payer: Medicare Other | Admitting: Hematology & Oncology

## 2020-11-27 ENCOUNTER — Other Ambulatory Visit: Payer: Self-pay

## 2020-11-27 ENCOUNTER — Encounter: Payer: Self-pay | Admitting: Hematology & Oncology

## 2020-11-27 ENCOUNTER — Telehealth: Payer: Self-pay

## 2020-11-27 ENCOUNTER — Inpatient Hospital Stay (HOSPITAL_BASED_OUTPATIENT_CLINIC_OR_DEPARTMENT_OTHER): Payer: Medicare Other | Admitting: Hematology & Oncology

## 2020-11-27 ENCOUNTER — Inpatient Hospital Stay: Payer: Medicare Other | Attending: Hematology & Oncology

## 2020-11-27 VITALS — BP 125/62 | HR 78 | Temp 98.4°F | Resp 20 | Wt 196.0 lb

## 2020-11-27 DIAGNOSIS — Z8616 Personal history of COVID-19: Secondary | ICD-10-CM | POA: Diagnosis not present

## 2020-11-27 DIAGNOSIS — I4891 Unspecified atrial fibrillation: Secondary | ICD-10-CM | POA: Insufficient documentation

## 2020-11-27 DIAGNOSIS — Z923 Personal history of irradiation: Secondary | ICD-10-CM | POA: Diagnosis not present

## 2020-11-27 DIAGNOSIS — Z7901 Long term (current) use of anticoagulants: Secondary | ICD-10-CM | POA: Insufficient documentation

## 2020-11-27 DIAGNOSIS — R002 Palpitations: Secondary | ICD-10-CM | POA: Diagnosis not present

## 2020-11-27 DIAGNOSIS — I480 Paroxysmal atrial fibrillation: Secondary | ICD-10-CM

## 2020-11-27 DIAGNOSIS — C099 Malignant neoplasm of tonsil, unspecified: Secondary | ICD-10-CM | POA: Diagnosis not present

## 2020-11-27 DIAGNOSIS — Z85818 Personal history of malignant neoplasm of other sites of lip, oral cavity, and pharynx: Secondary | ICD-10-CM | POA: Insufficient documentation

## 2020-11-27 DIAGNOSIS — Z9221 Personal history of antineoplastic chemotherapy: Secondary | ICD-10-CM | POA: Insufficient documentation

## 2020-11-27 LAB — CBC WITH DIFFERENTIAL (CANCER CENTER ONLY)
Abs Immature Granulocytes: 0.03 10*3/uL (ref 0.00–0.07)
Basophils Absolute: 0 10*3/uL (ref 0.0–0.1)
Basophils Relative: 0 %
Eosinophils Absolute: 0.1 10*3/uL (ref 0.0–0.5)
Eosinophils Relative: 2 %
HCT: 42.3 % (ref 39.0–52.0)
Hemoglobin: 14.2 g/dL (ref 13.0–17.0)
Immature Granulocytes: 1 %
Lymphocytes Relative: 13 %
Lymphs Abs: 0.8 10*3/uL (ref 0.7–4.0)
MCH: 32.9 pg (ref 26.0–34.0)
MCHC: 33.6 g/dL (ref 30.0–36.0)
MCV: 98.1 fL (ref 80.0–100.0)
Monocytes Absolute: 0.7 10*3/uL (ref 0.1–1.0)
Monocytes Relative: 10 %
Neutro Abs: 4.9 10*3/uL (ref 1.7–7.7)
Neutrophils Relative %: 74 %
Platelet Count: 207 10*3/uL (ref 150–400)
RBC: 4.31 MIL/uL (ref 4.22–5.81)
RDW: 14.1 % (ref 11.5–15.5)
WBC Count: 6.6 10*3/uL (ref 4.0–10.5)
nRBC: 0 % (ref 0.0–0.2)

## 2020-11-27 LAB — CMP (CANCER CENTER ONLY)
ALT: 25 U/L (ref 0–44)
AST: 25 U/L (ref 15–41)
Albumin: 4.1 g/dL (ref 3.5–5.0)
Alkaline Phosphatase: 53 U/L (ref 38–126)
Anion gap: 7 (ref 5–15)
BUN: 16 mg/dL (ref 8–23)
CO2: 33 mmol/L — ABNORMAL HIGH (ref 22–32)
Calcium: 9.9 mg/dL (ref 8.9–10.3)
Chloride: 104 mmol/L (ref 98–111)
Creatinine: 1.14 mg/dL (ref 0.61–1.24)
GFR, Estimated: >60 mL/min (ref >60)
Glucose, Bld: 109 mg/dL — ABNORMAL HIGH (ref 70–99)
Potassium: 4.5 mmol/L (ref 3.5–5.1)
Sodium: 144 mmol/L (ref 135–145)
Total Bilirubin: 0.7 mg/dL (ref 0.3–1.2)
Total Protein: 6.5 g/dL (ref 6.5–8.1)

## 2020-11-27 LAB — TSH: TSH: 1.271 u[IU]/mL (ref 0.320–4.118)

## 2020-11-27 NOTE — Progress Notes (Signed)
Hematology and Oncology Follow Up Visit  Randy Hanson [id number] 18-Sep-1940 80 y.o. 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  He looks fantastic.  He did come down with COVID.  He and his [place] were at the beach.  10 out of 11 family members got COVID.  His main problem was the fatigue.  Had a little bit of nausea.  This seems to have gotten better.  He has no problems swallowing.  He still needs little bit of water to help with swallowing.  He has had no problems with his thyroid.  His last TSH was 1.67.  He has had no issues with bleeding.  He has had no change in bowel or bladder habits.  He has had no leg swelling.  He still working part-time at the [employer].  He helps his son.  Overall, I would have to say that his performance status is ECOG 1.     Medications:  Allergies as of 11/27/2020       Reactions   Lisinopril Cough   Acyclovir And Related Other (See Comments)   Fever, chills   Famciclovir Other (See Comments)   Fever, chills   Simvastatin Other (See Comments)        Medication List        Accurate as of November 27, 2020 10:04 AM. If you have any questions, ask your nurse or doctor.          apixaban 5 MG Tabs tablet Commonly known as: Eliquis Take 1 tablet (5 mg total) by mouth 2 (two) times daily.   aspirin EC 81 MG tablet Take 81 mg by mouth daily.   chlorhexidine 0.12 % solution Commonly known as: PERIDEX Use as directed 15 mLs in the mouth or throat 2 (two) times daily. Mouth rinse   diphenhydrAMINE 25 MG tablet Commonly known as: BENADRYL Take 25 mg by mouth at bedtime.   folic acid 1 MG tablet Commonly known as: FOLVITE Take 1 mg by mouth daily.   predniSONE 10 MG tablet Commonly known as: DELTASONE Take  10 mg by mouth daily.   terazosin 2 MG capsule Commonly known as: HYTRIN Take 2 mg by mouth 2 (two) times daily.   vitamin B-12 1000 MCG tablet Commonly known as: CYANOCOBALAMIN Take 1,000 mcg by mouth daily. Chewable   Vitamin D 50 MCG (2000 UT) tablet Take 2,000 Units by mouth daily.        Allergies:  Allergies  Allergen Reactions   Lisinopril Cough   Acyclovir And Related Other (See Comments)    Fever, chills    Famciclovir Other (See Comments)    Fever, chills   Simvastatin Other (See Comments)    Past Medical History, Surgical history, Social history, and Family History were reviewed and updated.  Review of Systems: Review of Systems  Constitutional: Negative.   HENT: Negative.    Eyes: Negative.   Respiratory: Negative.    Cardiovascular: Negative.   Gastrointestinal: Negative.   Genitourinary: Negative.   Musculoskeletal: Negative.   Skin: Negative.   Neurological: Negative.   Endo/Heme/Allergies: Negative.   Psychiatric/Behavioral: Negative.      Physical Exam:  weight is 196 lb 0.6 oz (88.9 kg). His oral temperature is 98.4 F (  36.9 C). His blood pressure is 125/62 and his pulse is 78. His respiration is 20 and oxygen saturation is 97%.   Wt Readings from Last 3 Encounters:  11/27/20 196 lb 0.6 oz (88.9 kg)  04/26/20 205 lb (93 kg)  10/25/19 200 lb (90.7 kg)    Physical Exam Vitals reviewed.  HENT:     Head: Normocephalic and atraumatic.  Eyes:     Pupils: Pupils are equal, round, and reactive to light.  Cardiovascular:     Rate and Rhythm: Normal rate and regular rhythm.     Heart sounds: Normal heart sounds.  Pulmonary:     Effort: Pulmonary effort is normal.     Breath sounds: Normal breath sounds.  Abdominal:     General: Bowel sounds are normal.     Palpations: Abdomen is soft.     Comments: Abdominal exam shows a soft abdomen.  He has well-healed laparoscopic scars.  He has decent bowel sounds.  There is no guarding or rebound  tenderness.  Musculoskeletal:        General: No tenderness or deformity. Normal range of motion.     Cervical back: Normal range of motion.  Lymphadenopathy:     Cervical: No cervical adenopathy.  Skin:    General: Skin is warm and dry.     Findings: No erythema or rash.  Neurological:     Mental Status: He is alert and oriented to person, place, and time.  Psychiatric:        Behavior: Behavior normal.        Thought Content: Thought content normal.        Judgment: Judgment normal.     Lab Results  Component Value Date   WBC 6.6 11/27/2020   HGB 14.2 11/27/2020   HCT 42.3 11/27/2020   MCV 98.1 11/27/2020   PLT 207 11/27/2020   No results found for: FERRITIN, IRON, TIBC, UIBC, IRONPCTSAT Lab Results  Component Value Date   RBC 4.31 11/27/2020   No results found for: KPAFRELGTCHN, LAMBDASER, KAPLAMBRATIO No results found for: IGGSERUM, IGA, IGMSERUM No results found for: Marda Stalker, SPEI   Chemistry      Component Value Date/Time   NA 144 11/27/2020 0849   NA 146 (H) 02/20/2017 1007   NA 140 07/04/2016 1014   K 4.5 11/27/2020 0849   K 4.7 02/20/2017 1007   K 4.7 07/04/2016 1014   CL 104 11/27/2020 0849   CL 104 02/20/2017 1007   CO2 33 (H) 11/27/2020 0849   CO2 31 02/20/2017 1007   CO2 27 07/04/2016 1014   BUN 16 11/27/2020 0849   BUN 14 02/20/2017 1007   BUN 22.1 07/04/2016 1014   CREATININE 1.14 11/27/2020 0849   CREATININE 1.1 02/20/2017 1007   CREATININE 1.0 07/04/2016 1014      Component Value Date/Time   CALCIUM 9.9 11/27/2020 0849   CALCIUM 9.8 02/20/2017 1007   CALCIUM 10.6 (H) 07/04/2016 1014   ALKPHOS 53 11/27/2020 0849   ALKPHOS 72 02/20/2017 1007   ALKPHOS 255 (H) 07/04/2016 1014   AST 25 11/27/2020 0849   AST 28 07/04/2016 1014   ALT 25 11/27/2020 0849   ALT 17 02/20/2017 1007   ALT 55 07/04/2016 1014   BILITOT 0.7 11/27/2020 0849   BILITOT 0.80 07/04/2016 1014     Impression  and Plan: [facility]on is a very pleasant 80 year old  white male with stage I  HPV+ - squamous cell  carcinoma the right tonsil. He completed radiation and chemotherapy in early August, 2017.   I see no evidence of recurrence of his malignancy.  He is now 5 years.  At this point, I think we can now get him back yearly.  I am so happy for him.  He had a really tough time with treatment but is coming through like the true American hero that he is.  He did serve our country.    [name], MD 9/19/202210:04 AM

## 2020-11-27 NOTE — Telephone Encounter (Signed)
Appts made and printed for  pt per 11/27/20 los  [employer]

## 2021-01-15 DIAGNOSIS — E538 Deficiency of other specified B group vitamins: Secondary | ICD-10-CM | POA: Diagnosis not present

## 2021-01-15 DIAGNOSIS — R7309 Other abnormal glucose: Secondary | ICD-10-CM | POA: Diagnosis not present

## 2021-01-15 DIAGNOSIS — I10 Essential (primary) hypertension: Secondary | ICD-10-CM | POA: Diagnosis not present

## 2021-01-15 DIAGNOSIS — D6869 Other thrombophilia: Secondary | ICD-10-CM | POA: Diagnosis not present

## 2021-01-15 DIAGNOSIS — N401 Enlarged prostate with lower urinary tract symptoms: Secondary | ICD-10-CM | POA: Diagnosis not present

## 2021-01-15 DIAGNOSIS — I480 Paroxysmal atrial fibrillation: Secondary | ICD-10-CM | POA: Diagnosis not present

## 2021-01-15 DIAGNOSIS — R319 Hematuria, unspecified: Secondary | ICD-10-CM | POA: Diagnosis not present

## 2021-01-15 DIAGNOSIS — R739 Hyperglycemia, unspecified: Secondary | ICD-10-CM | POA: Diagnosis not present

## 2021-01-15 DIAGNOSIS — E7800 Pure hypercholesterolemia, unspecified: Secondary | ICD-10-CM | POA: Diagnosis not present

## 2021-01-16 ENCOUNTER — Other Ambulatory Visit: Payer: Self-pay | Admitting: Internal Medicine

## 2021-01-16 DIAGNOSIS — R319 Hematuria, unspecified: Secondary | ICD-10-CM

## 2021-01-17 ENCOUNTER — Ambulatory Visit
Admission: RE | Admit: 2021-01-17 | Discharge: 2021-01-17 | Disposition: A | Discharge status: Home / Self Care | Payer: Medicare Other | Source: Ambulatory Visit | Attending: Internal Medicine | Admitting: Internal Medicine

## 2021-01-17 DIAGNOSIS — R319 Hematuria, unspecified: Secondary | ICD-10-CM

## 2021-01-18 DIAGNOSIS — Z0000 Encounter for general adult medical examination without abnormal findings: Secondary | ICD-10-CM | POA: Diagnosis not present

## 2021-01-18 DIAGNOSIS — K219 Gastro-esophageal reflux disease without esophagitis: Secondary | ICD-10-CM | POA: Diagnosis not present

## 2021-01-18 DIAGNOSIS — N401 Enlarged prostate with lower urinary tract symptoms: Secondary | ICD-10-CM | POA: Diagnosis not present

## 2021-01-18 DIAGNOSIS — E538 Deficiency of other specified B group vitamins: Secondary | ICD-10-CM | POA: Diagnosis not present

## 2021-01-18 DIAGNOSIS — R739 Hyperglycemia, unspecified: Secondary | ICD-10-CM | POA: Diagnosis not present

## 2021-01-18 DIAGNOSIS — Z23 Encounter for immunization: Secondary | ICD-10-CM | POA: Diagnosis not present

## 2021-01-18 DIAGNOSIS — I480 Paroxysmal atrial fibrillation: Secondary | ICD-10-CM | POA: Diagnosis not present

## 2021-01-18 DIAGNOSIS — E559 Vitamin D deficiency, unspecified: Secondary | ICD-10-CM | POA: Diagnosis not present

## 2021-01-18 DIAGNOSIS — M549 Dorsalgia, unspecified: Secondary | ICD-10-CM | POA: Diagnosis not present

## 2021-01-18 DIAGNOSIS — E7800 Pure hypercholesterolemia, unspecified: Secondary | ICD-10-CM | POA: Diagnosis not present

## 2021-01-18 DIAGNOSIS — D6869 Other thrombophilia: Secondary | ICD-10-CM | POA: Diagnosis not present

## 2021-01-18 DIAGNOSIS — Z1389 Encounter for screening for other disorder: Secondary | ICD-10-CM | POA: Diagnosis not present

## 2021-01-18 DIAGNOSIS — C099 Malignant neoplasm of tonsil, unspecified: Secondary | ICD-10-CM | POA: Diagnosis not present

## 2021-01-18 DIAGNOSIS — R319 Hematuria, unspecified: Secondary | ICD-10-CM | POA: Diagnosis not present

## 2021-01-18 DIAGNOSIS — I10 Essential (primary) hypertension: Secondary | ICD-10-CM | POA: Diagnosis not present

## 2021-02-15 DIAGNOSIS — R350 Frequency of micturition: Secondary | ICD-10-CM | POA: Diagnosis not present

## 2021-02-15 DIAGNOSIS — N401 Enlarged prostate with lower urinary tract symptoms: Secondary | ICD-10-CM | POA: Diagnosis not present

## 2021-02-15 DIAGNOSIS — R351 Nocturia: Secondary | ICD-10-CM | POA: Diagnosis not present

## 2021-02-15 DIAGNOSIS — R310 Gross hematuria: Secondary | ICD-10-CM | POA: Diagnosis not present

## 2021-09-07 DIAGNOSIS — R351 Nocturia: Secondary | ICD-10-CM | POA: Diagnosis not present

## 2021-09-07 DIAGNOSIS — N401 Enlarged prostate with lower urinary tract symptoms: Secondary | ICD-10-CM | POA: Diagnosis not present

## 2021-09-07 DIAGNOSIS — R310 Gross hematuria: Secondary | ICD-10-CM | POA: Diagnosis not present

## 2021-11-20 ENCOUNTER — Encounter: Payer: Self-pay | Admitting: Hematology & Oncology

## 2021-11-27 ENCOUNTER — Encounter: Payer: Self-pay | Admitting: Hematology & Oncology

## 2021-11-27 ENCOUNTER — Inpatient Hospital Stay: Payer: Medicare HMO | Attending: Hematology & Oncology

## 2021-11-27 ENCOUNTER — Inpatient Hospital Stay: Payer: Medicare HMO | Admitting: Hematology & Oncology

## 2021-11-27 VITALS — BP 143/72 | HR 61 | Temp 97.7°F | Resp 18 | Ht 71.0 in | Wt 198.8 lb

## 2021-11-27 DIAGNOSIS — Z923 Personal history of irradiation: Secondary | ICD-10-CM | POA: Diagnosis not present

## 2021-11-27 DIAGNOSIS — Z7982 Long term (current) use of aspirin: Secondary | ICD-10-CM | POA: Insufficient documentation

## 2021-11-27 DIAGNOSIS — C099 Malignant neoplasm of tonsil, unspecified: Secondary | ICD-10-CM | POA: Diagnosis not present

## 2021-11-27 DIAGNOSIS — Z85818 Personal history of malignant neoplasm of other sites of lip, oral cavity, and pharynx: Secondary | ICD-10-CM | POA: Insufficient documentation

## 2021-11-27 DIAGNOSIS — Z79899 Other long term (current) drug therapy: Secondary | ICD-10-CM | POA: Insufficient documentation

## 2021-11-27 DIAGNOSIS — Z9221 Personal history of antineoplastic chemotherapy: Secondary | ICD-10-CM | POA: Diagnosis not present

## 2021-11-27 DIAGNOSIS — R002 Palpitations: Secondary | ICD-10-CM

## 2021-11-27 LAB — CBC WITH DIFFERENTIAL (CANCER CENTER ONLY)
Abs Immature Granulocytes: 0.04 10*3/uL (ref 0.00–0.07)
Basophils Absolute: 0 10*3/uL (ref 0.0–0.1)
Basophils Relative: 1 %
Eosinophils Absolute: 0.1 10*3/uL (ref 0.0–0.5)
Eosinophils Relative: 1 %
HCT: 40.9 % (ref 39.0–52.0)
Hemoglobin: 13.7 g/dL (ref 13.0–17.0)
Immature Granulocytes: 1 %
Lymphocytes Relative: 6 %
Lymphs Abs: 0.5 10*3/uL — ABNORMAL LOW (ref 0.7–4.0)
MCH: 33 pg (ref 26.0–34.0)
MCHC: 33.5 g/dL (ref 30.0–36.0)
MCV: 98.6 fL (ref 80.0–100.0)
Monocytes Absolute: 0.6 10*3/uL (ref 0.1–1.0)
Monocytes Relative: 7 %
Neutro Abs: 6.8 10*3/uL (ref 1.7–7.7)
Neutrophils Relative %: 84 %
Platelet Count: 188 10*3/uL (ref 150–400)
RBC: 4.15 MIL/uL — ABNORMAL LOW (ref 4.22–5.81)
RDW: 13.6 % (ref 11.5–15.5)
WBC Count: 8.1 10*3/uL (ref 4.0–10.5)
nRBC: 0 % (ref 0.0–0.2)

## 2021-11-27 LAB — CMP (CANCER CENTER ONLY)
ALT: 22 U/L (ref 0–44)
AST: 21 U/L (ref 15–41)
Albumin: 4.2 g/dL (ref 3.5–5.0)
Alkaline Phosphatase: 40 U/L (ref 38–126)
Anion gap: 7 (ref 5–15)
BUN: 18 mg/dL (ref 8–23)
CO2: 32 mmol/L (ref 22–32)
Calcium: 10.4 mg/dL — ABNORMAL HIGH (ref 8.9–10.3)
Chloride: 102 mmol/L (ref 98–111)
Creatinine: 1.4 mg/dL — ABNORMAL HIGH (ref 0.61–1.24)
GFR, Estimated: 51 mL/min — ABNORMAL LOW (ref >60)
Glucose, Bld: 115 mg/dL — ABNORMAL HIGH (ref 70–99)
Potassium: 4.8 mmol/L (ref 3.5–5.1)
Sodium: 141 mmol/L (ref 135–145)
Total Bilirubin: 0.7 mg/dL (ref 0.3–1.2)
Total Protein: 6.4 g/dL — ABNORMAL LOW (ref 6.5–8.1)

## 2021-11-27 LAB — TSH: TSH: 2.284 u[IU]/mL (ref 0.350–4.500)

## 2021-11-27 NOTE — Progress Notes (Signed)
Hematology and Oncology Follow Up Visit  Randy [facility]on [id number] Dec 25, 1940 81 y.o. 11/27/2021   Principle Diagnosis:  Stage I (T1N1M0) - HPV + - Squamous cell ca of right tonsil  Current Therapy:   Cisplatin s/p cycle #7 - completed Radiation therapy s/p  - completed 10/16/2015    Interim History:  Randy [employer]on is here today for a follow-up.  We see him yearly.  He comes in with his wife.  He has had a good year so far.  Thank God, has not had no problems with COVID.  Last time I saw him a year ago he had COVID.  He has had no problems with hypothyroidism.  When we last saw him a year ago his TSH was 1.3.  His mouth is still dry.  This is from the radiation that he took for his throat cancer.  He has had no issues with nausea or vomiting.  He has had no change in bowel or bladder habits.  I think he does see Urology.  He has had no leg swelling.  He has had no rashes.  There is been no cough.  He said no chest wall pain.  There has been no shortness of breath.  Overall, I would say his performance status is probably ECOG 0.    Medications:  Allergies as of 11/27/2021       Reactions   Lisinopril Cough   Acyclovir And Related Other (See Comments)   Fever, chills   Famciclovir Other (See Comments)   Fever, chills   Simvastatin Other (See Comments)        Medication List        Accurate as of November 27, 2021 11:22 AM. If you have any questions, ask your nurse or doctor.          amLODipine 2.5 MG tablet Commonly known as: NORVASC Take 2.5 mg by mouth daily.   apixaban 5 MG Tabs tablet Commonly known as: Eliquis Take 1 tablet (5 mg total) by mouth 2 (two) times daily.   aspirin EC 81 MG tablet Take 81 mg by mouth daily.   chlorhexidine 0.12 % solution Commonly known as: PERIDEX Use as directed 15 mLs in the mouth or throat 2 (two) times daily. Mouth rinse   cyanocobalamin 1000 MCG tablet Commonly known as: VITAMIN B12 Take 1,000 mcg by mouth daily.  Chewable   diphenhydrAMINE 25 MG tablet Commonly known as: BENADRYL Take 25 mg by mouth at bedtime.   folic acid 1 MG tablet Commonly known as: FOLVITE Take 1 mg by mouth daily.   omeprazole 20 MG capsule Commonly known as: PRILOSEC Take 20 mg by mouth daily.   predniSONE 10 MG tablet Commonly known as: DELTASONE Take 10 mg by mouth daily.   terazosin 2 MG capsule Commonly known as: HYTRIN Take 2 mg by mouth 2 (two) times daily.   Vitamin D 50 MCG (2000 UT) tablet Take 2,000 Units by mouth daily.        Allergies:  Allergies  Allergen Reactions   Lisinopril Cough   Acyclovir And Related Other (See Comments)    Fever, chills    Famciclovir Other (See Comments)    Fever, chills   Simvastatin Other (See Comments)    Past Medical History, Surgical history, Social history, and Family History were reviewed and updated.  Review of Systems: Review of Systems  Constitutional: Negative.   HENT: Negative.    Eyes: Negative.   Respiratory: Negative.    Cardiovascular:  Negative.   Gastrointestinal: Negative.   Genitourinary: Negative.   Musculoskeletal: Negative.   Skin: Negative.   Neurological: Negative.   Endo/Heme/Allergies: Negative.   Psychiatric/Behavioral: Negative.       Physical Exam:  height is 5\' 11"  (1.803 m) and weight is 198 lb 12 oz (90.2 kg). His oral temperature is 97.7 F (36.5 C). His blood pressure is 143/72 (abnormal) and his pulse is 61. His respiration is 18 and oxygen saturation is 95%.   Wt Readings from Last 3 Encounters:  11/27/21 198 lb 12 oz (90.2 kg)  11/27/20 196 lb 0.6 oz (88.9 kg)  04/26/20 205 lb (93 kg)    Physical Exam Vitals reviewed.  HENT:     Head: Normocephalic and atraumatic.  Eyes:     Pupils: Pupils are equal, round, and reactive to light.  Cardiovascular:     Rate and Rhythm: Normal rate and regular rhythm.     Heart sounds: Normal heart sounds.  Pulmonary:     Effort: Pulmonary effort is normal.      Breath sounds: Normal breath sounds.  Abdominal:     General: Bowel sounds are normal.     Palpations: Abdomen is soft.     Comments: Abdominal exam shows a soft abdomen.  He has well-healed laparoscopic scars.  He has decent bowel sounds.  There is no guarding or rebound tenderness.  Musculoskeletal:        General: No tenderness or deformity. Normal range of motion.     Cervical back: Normal range of motion.  Lymphadenopathy:     Cervical: No cervical adenopathy.  Skin:    General: Skin is warm and dry.     Findings: No erythema or rash.  Neurological:     Mental Status: He is alert and oriented to person, place, and time.  Psychiatric:        Behavior: Behavior normal.        Thought Content: Thought content normal.        Judgment: Judgment normal.      Lab Results  Component Value Date   WBC 8.1 11/27/2021   HGB 13.7 11/27/2021   HCT 40.9 11/27/2021   MCV 98.6 11/27/2021   PLT 188 11/27/2021   No results found for: "FERRITIN", "IRON", "TIBC", "UIBC", "IRONPCTSAT" Lab Results  Component Value Date   RBC 4.15 (L) 11/27/2021   No results found for: "KPAFRELGTCHN", "LAMBDASER", "KAPLAMBRATIO" No results found for: "IGGSERUM", "IGA", "IGMSERUM" No results found for: "TOTALPROTELP", "ALBUMINELP", "A1GS", "A2GS", "BETS", "BETA2SER", "GAMS", "MSPIKE", "SPEI"   Chemistry      Component Value Date/Time   NA 141 11/27/2021 0949   NA 146 (H) 02/20/2017 1007   NA 140 07/04/2016 1014   K 4.8 11/27/2021 0949   K 4.7 02/20/2017 1007   K 4.7 07/04/2016 1014   CL 102 11/27/2021 0949   CL 104 02/20/2017 1007   CO2 32 11/27/2021 0949   CO2 31 02/20/2017 1007   CO2 27 07/04/2016 1014   BUN 18 11/27/2021 0949   BUN 14 02/20/2017 1007   BUN 22.1 07/04/2016 1014   CREATININE 1.40 (H) 11/27/2021 0949   CREATININE 1.1 02/20/2017 1007   CREATININE 1.0 07/04/2016 1014      Component Value Date/Time   CALCIUM 10.4 (H) 11/27/2021 0949   CALCIUM 9.8 02/20/2017 1007   CALCIUM 10.6  (H) 07/04/2016 1014   ALKPHOS 40 11/27/2021 0949   ALKPHOS 72 02/20/2017 1007   ALKPHOS 255 (H) 07/04/2016 1014  AST 21 11/27/2021 0949   AST 28 07/04/2016 1014   ALT 22 11/27/2021 0949   ALT 17 02/20/2017 1007   ALT 55 07/04/2016 1014   BILITOT 0.7 11/27/2021 0949   BILITOT 0.80 07/04/2016 1014     Impression and Plan: Mr. Cordoves is a very pleasant 81 year old  white male with stage I  HPV+ - squamous cell carcinoma the right tonsil. He completed radiation and chemotherapy in early August, 2017.   I see no evidence of recurrence of his malignancy.  He is now 6 years.    We had to monitor his thyroid.  I think he is at risk for hypothyroidism.  We will still plan to get him back yearly.    [name], MD 9/19/202311:22 AM

## 2022-02-22 ENCOUNTER — Telehealth: Payer: Self-pay

## 2022-02-22 NOTE — Telephone Encounter (Signed)
Primary Cardiologist:Peter Johnsie Cancel, MD  Chart reviewed as part of pre-operative protocol coverage. Because of Gareth L Casella's past medical history and time since last visit, he/she will require a follow-up visit in order to better assess preoperative cardiovascular risk.  Patient has not been seen in our office since 07/07/2019, therefore we are not able to provide clearance since we have not seen in in > 1 year.   Pre-op covering staff: - Please schedule appointment and call patient to inform them. - Please contact requesting surgeon's office via preferred method (i.e, phone, fax) to inform them of need for appointment prior to surgery.    If applicable, this message will also be routed to pharmacy pool and/or primary cardiologist for input on holding anticoagulant/antiplatelet agent as requested below so that this information is available at time of patient's appointment.   Emmaline Life, NP  02/22/2022, 11:44 AM

## 2022-02-22 NOTE — Telephone Encounter (Signed)
I s/w the pt's wife and she has scheduled an In office appt for pre op clearance. Appt with Dr. Johnsie Cancel 02/25/22 @ 3:15. Pt's wife thanked me for the help.

## 2022-02-22 NOTE — Telephone Encounter (Signed)
RN Anise Salvo [facility] to acquire email address at the request of Dr. Eppie Gibson. This is in response to a fax sent for dental clearance for this pt. Susie at [facility] stated that he is needing all upper teeth pulled and dentures made for pt.  Rn will email to obtain dosimetry information but the office stated they will still need her approval/ opinion as well.  The office stated they will need written approval. Rn will continue to work on this clearance for this pt and fax the sent form once everything is complete.

## 2022-02-22 NOTE — Telephone Encounter (Signed)
1st attempt to reach pt regarding surgical clearance and the need for an in-office appointment.  Left pt a message to call back and get that scheduled.

## 2022-02-22 NOTE — Telephone Encounter (Signed)
Patient's wife is returning call. 

## 2022-02-22 NOTE — Telephone Encounter (Signed)
..  Pre-operative Risk Assessment    Patient Name: Randy [facility]on  DOB: [birth year] MRN: [id number]     Request for Surgical Clearance    Procedure:  Dental Extraction - Amount of Teeth to be Pulled:  7  Date of Surgery:  Clearance TBD                                 Surgeon:DR Warden Fillers Surgeon's Group or Practice Name:    [facility] Phone number:  [phone] Fax number:  [phone]   Type of Clearance Requested:   - Medical  - Pharmacy:  Hold Aspirin and Apixaban (Eliquis)     Type of Anesthesia:  Local    Additional requests/questions:    [name]   02/22/2022, 9:27 AM

## 2022-02-24 NOTE — Progress Notes (Signed)
CARDIOLOGY CONSULT NOTE       Patient ID: Randy Hanson MRN: [id number] DOB/AGE: [birth year] 79 y.o.   Primary Physician: [name], MD Primary Cardiologist: Brooks Kinnan/Taylor   HPI:  81 y.o. first seen in hospital 03/22/19.  History of throat cancer in remission and previous smoking. He had acute cholecystitis and presented in afib.  He has had issues with hematuria He converted with beta blockers No issues with his gallbladder surgery CHADVASC 3 Not anticoagulated due to issues with hematuria and conversion. Seen by Dr Ladona Ridgel 05/18/19 and some of his outpatient monitor showed asymptomatic NSVT but no PAF Looks like he was started on eliquis 79/1/21   TTE done in hospital 03/22/19 EF 65-70% MAC AV sclerosis and normal atrial sizes  He has no history of CAD  Montior:  With PAF, wenkebach 2:1 block and 3.2 second pause   D/c from hospital 06/06/19 with concern for CBD stone but [place] and MRCP negative LFTls elevated Suspicion For passed stone ? Vasovagal episode in hospital with bradycardia and lopressor d/c Episode occurred early in am With wenckebach  This patients CHA2DS2-VASc Score and unadjusted Ischemic Stroke Rate (% per year) is equal to 3.2 % stroke rate/year from a score of 3  Above score calculated as 1 point each if present [CHF, HTN, DM, Vascular=MI/PAD/Aortic Plaque, Age if 65-74, or Male] Above score calculated as 2 points each if present [Age > 75, or Stroke/TIA/TE]  He denies chest pain or pre syncope Seen by Dr Ladona Ridgel and deferred PPM unless became symptomatic Needs dental work and ok to hold eliquis 2 days before and hold ASA 5 days before   ROS All other systems reviewed and negative except as noted above  Past Medical History:  Diagnosis Date   Back pain    BPH (benign prostatic hyperplasia)    Cancer of tonsil, 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 Izell [place]  AUGUST 2011   Radiation 08/28/15- 10/16/15   Right Tonsil and Bilateral Neck    Family History  Problem Relation Age of Onset   Hypertension Mother     Social History   Socioeconomic History   Marital status: Married    Spouse name: Not on file   Number of children: Not on file   Years of education: Not on file   Highest education level: Not on file  Occupational History   Not on file  Tobacco Use   Smoking status: Former    Types: Cigarettes, Cigars    Quit date: 03/14/1980    Years since quitting: 41.9   Smokeless tobacco: Never   Tobacco comments:    quit 35 years ago  Vaping Use   Vaping Use: Never used  Substance and Sexual Activity   Alcohol use: No    Alcohol/week: 0.0 standard drinks of alcohol   Drug use: No   Sexual activity: Not on file  Other Topics Concern   Not on file  Social History Narrative   TOBACCO USE CIGARETTES: NEVER SMOKED.NO SMOKING.NO ALCOHOL .CAFFEINE YES:NO  RECREATIONAL DRUGS. OCCUPATION :RETIRED   MARTIAL STATUS :  MARRIED    Social Determinants of Health   Financial Resource Strain: Not on file  Food Insecurity: Not on file  Transportation Needs: No Transportation Needs (07/31/2018)   PRAPARE - Administrator, Civil Service (Medical): No    Lack of Transportation (Non-Medical): No  Physical Activity: Not on file  Stress: Not on file  Social Connections: Not on file  Intimate Partner Violence: Not on file    Past Surgical History:  Procedure Laterality Date   CARDIAC CATHETERIZATION     remote >15 years ago   CHOLECYSTECTOMY N/A 03/23/2019   Procedure: LAPAROSCOPIC CHOLECYSTECTOMY;  Surgeon: Abigail Miyamoto, MD;  Location: [facility] OR;  Service: General;  Laterality: N/A;   IR GASTROSTOMY TUBE  REMOVAL  02/24/2017   IR GENERIC HISTORICAL  10/11/2015   IR PATIENT EVAL TECH 0-60 MINS 10/11/2015 Darrell K Allred, PA-C [facility]   IR PATIENT EVAL TECH 0-60 MINS  07/05/2016   IR PATIENT EVAL TECH 0-60 MINS  08/14/2016   IR PATIENT EVAL TECH 0-60 MINS  12/09/2016   IR REMOVAL TUN ACCESS W/ PORT W/O FL MOD SED  07/22/2016   VASECTOMY  1975      Current Outpatient Medications:    amLODipine (NORVASC) 5 MG tablet, Take 5 mg by mouth daily., Disp: , Rfl:    apixaban (ELIQUIS) 5 MG TABS tablet, Take 1 tablet (5 mg total) by mouth 2 (two) times daily., Disp: 60 tablet, Rfl: 11   aspirin EC 81 MG tablet, Take 81 mg by mouth daily., Disp: , Rfl:    chlorhexidine (PERIDEX) 0.12 % solution, Use as directed 15 mLs in the mouth or throat 2 (two) times daily. Mouth rinse, Disp: , Rfl:    Cholecalciferol (VITAMIN D) 50 MCG (2000 UT) tablet, Take 2,000 Units by mouth daily., Disp: , Rfl:    diphenhydrAMINE (BENADRYL) 25 MG tablet, Take 25 mg by mouth at bedtime., Disp: , Rfl:    finasteride (PROSCAR) 5 MG tablet, Take 5 mg by mouth daily., Disp: , Rfl:    folic acid (FOLVITE) 1 MG tablet, Take 1 mg by mouth daily. , Disp: , Rfl:    omeprazole (PRILOSEC) 20 MG capsule, Take 20 mg by mouth daily., Disp: , Rfl:    predniSONE (DELTASONE) 10 MG tablet, Take 10 mg by mouth daily., Disp: , Rfl:    terazosin (HYTRIN) 2 MG capsule, Take 2 mg by mouth 2 (two) times daily. , Disp: , Rfl:    vitamin B-12 (CYANOCOBALAMIN) 1000 MCG tablet, Take 1,000 mcg by mouth daily. Chewable, Disp: , Rfl:  No current facility-administered medications for this visit.  Facility-Administered Medications Ordered in Other Visits:    regadenoson (LEXISCAN) injection SOLN 0.4 mg, 0.4 mg, Intravenous, Once, Croitoru, Mihai, MD    Physical Exam: Blood pressure 122/72, pulse 89, height 5\' 11"  (1.803 m), weight 199 lb (90.3 kg), SpO2 98 %.    Affect appropriate Elderly male  HEENT: normal Neck supple with no adenopathy JVP normal  no bruits no thyromegaly Lungs clear with no wheezing and good diaphragmatic motion Heart:  S1/S2 AV sclerosis murmur, no rub, gallop or click PMI normal Abdomen: benighn, BS positve, no tenderness, no AAA no bruit.  No HSM or HJR post lap choly Distal pulses intact with no bruits No edema Neuro non-focal Skin warm and dry No muscular weakness   Labs:   Lab Results  Component Value Date   WBC 8.1 11/27/2021   HGB 13.7 11/27/2021   HCT  40.9 11/27/2021   MCV 98.6 11/27/2021   PLT 188 11/27/2021    No results for input(s): "NA", "K", "CL", "CO2", "BUN", "CREATININE", "CALCIUM", "PROT", "BILITOT", "ALKPHOS", "ALT", "AST", "GLUCOSE" in the last 168 hours.  Invalid input(s): "LABALBU"   Radiology: No results found.   EKG: SR rate 66 normal 05/07/19 02/25/2022 SR rate 86 variable P wave and wenkebach   ASSESSMENT AND PLAN:   1. PAF:  Short lived during admission for cholecystitis Spontaneous conversion Seen by EP Dr Ladona Ridgel indicated would not anticoagulate Was on beta blocker but d/c with wenkebach and vagal reaction while in hospitial for CBD stone No symptoms and normal LV function on echo Monitor confirms recurrent PAF with SSS AV block and pauses. Started on eliquis 06/2019  Discussed stroke risk    2. HTN:  Well controlled.  Continue current medications and low sodium Dash type diet.    3. Bradycardia:  follows with GT no symptoms able to reach max HR of 150 bpm on stress test 06/17/19   4. Preoperative:  ok to hold eliquis 2 days before and baby ASA 5 days before dental procedure   5. 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

## 2022-02-25 ENCOUNTER — Encounter: Payer: Self-pay | Admitting: Cardiovascular Disease

## 2022-02-25 ENCOUNTER — Ambulatory Visit: Payer: Medicare HMO | Attending: Cardiovascular Disease | Admitting: Cardiovascular Disease

## 2022-02-25 VITALS — BP 122/72 | HR 89 | Ht 71.0 in | Wt 199.0 lb

## 2022-02-25 DIAGNOSIS — Z01810 Encounter for preprocedural cardiovascular examination: Secondary | ICD-10-CM | POA: Diagnosis not present

## 2022-02-25 DIAGNOSIS — I10 Essential (primary) hypertension: Secondary | ICD-10-CM | POA: Diagnosis not present

## 2022-02-25 DIAGNOSIS — R002 Palpitations: Secondary | ICD-10-CM

## 2022-02-25 DIAGNOSIS — I480 Paroxysmal atrial fibrillation: Secondary | ICD-10-CM | POA: Diagnosis not present

## 2022-02-25 NOTE — Patient Instructions (Signed)
Medication Instructions:  Your physician recommends that you continue on your current medications as directed. Please refer to the Current Medication list given to you today.  *If you need a refill on your cardiac medications before your next appointment, please call your pharmacy*  Lab Work: If you have labs (blood work) drawn today and your tests are completely normal, you will receive your results only by: [place] (if you have MyChart) OR A paper copy in the mail If you have any lab test that is abnormal or we need to change your treatment, we will call you to review the results.  Testing/Procedures: None ordered today.  Follow-Up: At [facility], you and your health needs are our priority.  As part of our continuing mission to provide you with exceptional heart care, we have created designated Provider Care Teams.  These Care Teams include your primary Cardiologist (physician) and Advanced Practice Providers (APPs -  Physician Assistants and Nurse Practitioners) who all work together to provide you with the care you need, when you need it.  We recommend signing up for the patient portal called "MyChart".  Sign up information is provided on this After Visit Summary.  MyChart is used to connect with patients for Virtual Visits (Telemedicine).  Patients are able to view lab/test results, encounter notes, upcoming appointments, etc.  Non-urgent messages can be sent to your provider as well.   To learn more about what you can do with MyChart, go to [email or web address].    Your next appointment:   6 month(s)  The format for your next appointment:   In Person  Provider:   Jenkins Rouge, MD     Important Information About Sugar

## 2022-05-16 DIAGNOSIS — E559 Vitamin D deficiency, unspecified: Secondary | ICD-10-CM | POA: Diagnosis not present

## 2022-05-16 DIAGNOSIS — D6869 Other thrombophilia: Secondary | ICD-10-CM | POA: Diagnosis not present

## 2022-05-16 DIAGNOSIS — N401 Enlarged prostate with lower urinary tract symptoms: Secondary | ICD-10-CM | POA: Diagnosis not present

## 2022-05-16 DIAGNOSIS — K219 Gastro-esophageal reflux disease without esophagitis: Secondary | ICD-10-CM | POA: Diagnosis not present

## 2022-05-16 DIAGNOSIS — R739 Hyperglycemia, unspecified: Secondary | ICD-10-CM | POA: Diagnosis not present

## 2022-05-16 DIAGNOSIS — I480 Paroxysmal atrial fibrillation: Secondary | ICD-10-CM | POA: Diagnosis not present

## 2022-05-16 DIAGNOSIS — E538 Deficiency of other specified B group vitamins: Secondary | ICD-10-CM | POA: Diagnosis not present

## 2022-05-16 DIAGNOSIS — E7800 Pure hypercholesterolemia, unspecified: Secondary | ICD-10-CM | POA: Diagnosis not present

## 2022-05-16 DIAGNOSIS — Z0000 Encounter for general adult medical examination without abnormal findings: Secondary | ICD-10-CM | POA: Diagnosis not present

## 2022-05-16 DIAGNOSIS — C099 Malignant neoplasm of tonsil, unspecified: Secondary | ICD-10-CM | POA: Diagnosis not present

## 2022-05-16 DIAGNOSIS — I10 Essential (primary) hypertension: Secondary | ICD-10-CM | POA: Diagnosis not present

## 2022-05-16 DIAGNOSIS — Z1331 Encounter for screening for depression: Secondary | ICD-10-CM | POA: Diagnosis not present

## 2022-05-16 DIAGNOSIS — M542 Cervicalgia: Secondary | ICD-10-CM | POA: Diagnosis not present

## 2022-05-16 DIAGNOSIS — Z79899 Other long term (current) drug therapy: Secondary | ICD-10-CM | POA: Diagnosis not present

## 2022-06-10 DIAGNOSIS — R748 Abnormal levels of other serum enzymes: Secondary | ICD-10-CM | POA: Diagnosis not present

## 2022-06-17 DIAGNOSIS — R310 Gross hematuria: Secondary | ICD-10-CM | POA: Diagnosis not present

## 2022-07-17 DIAGNOSIS — R748 Abnormal levels of other serum enzymes: Secondary | ICD-10-CM | POA: Diagnosis not present

## 2022-09-04 DIAGNOSIS — R350 Frequency of micturition: Secondary | ICD-10-CM | POA: Diagnosis not present

## 2022-09-04 DIAGNOSIS — R310 Gross hematuria: Secondary | ICD-10-CM | POA: Diagnosis not present

## 2022-09-04 DIAGNOSIS — N401 Enlarged prostate with lower urinary tract symptoms: Secondary | ICD-10-CM | POA: Diagnosis not present

## 2022-09-10 NOTE — Progress Notes (Signed)
CARDIOLOGY CONSULT NOTE       Patient ID: Randy Hanson MRN: [id number] DOB/AGE: [birth year] 82 y.o.   Primary Physician: Emilio Aspen, MD Primary Cardiologist: Hermenia Fritcher/Taylor   HPI:  82 y.o. first seen in hospital 03/22/19.  History of throat cancer in remission and previous smoking. He had acute cholecystitis and presented in afib.  He has had issues with hematuria He converted with beta blockers No issues with his gallbladder surgery CHADVASC 3 Not anticoagulated due to issues with hematuria and conversion. Seen by Dr Ladona Ridgel 05/18/19 and some of his outpatient monitor showed asymptomatic NSVT but no PAF Looks like he was started on eliquis 80/1/21   TTE done in hospital 03/22/19 EF 65-70% MAC AV sclerosis and normal atrial sizes  He has no history of CAD  Montior:  With PAF, wenkebach 2:1 block and 3.2 second pause   D/c from hospital 06/06/19 with concern for CBD stone but [place] and MRCP negative LFTls elevated Suspicion For passed stone ? Vasovagal episode in hospital with bradycardia and lopressor d/c Episode occurred early in am With wenckebach  This patients CHA2DS2-VASc Score and unadjusted Ischemic Stroke Rate (% per year) is equal to 3.2 % stroke rate/year from a score of 3  He denies chest pain or pre syncope Seen by Dr Ladona Ridgel and deferred PPM unless became symptomatic Needs dental work and ok to hold eliquis 2 days before and hold ASA 5 days before   His son [name] owns a body shop [address] that we use   Discussed updating monitor to see PAF burden and any AV block / bradycardia  ROS All other systems reviewed and negative except as noted above  Past Medical History:  Diagnosis Date   Back pain    BPH (benign prostatic hyperplasia)    Cancer of tonsil, 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 Izell [place]  AUGUST 2011   Radiation 08/28/15- 10/16/15   Right Tonsil and Bilateral Neck    Family History  Problem Relation Age of Onset   Hypertension Mother     Social History   Socioeconomic History   Marital status: Married    Spouse name: Not on file   Number of children: Not on file   Years of education: Not on file   Highest education level: Not on file  Occupational History   Not on file  Tobacco Use   Smoking status: Former    Types: Cigarettes, Cigars    Quit date: 03/14/1980    Years since quitting: 42.5   Smokeless tobacco: Never   Tobacco comments:    quit 35 years ago  Vaping Use   Vaping status: Never Used  Substance and Sexual Activity   Alcohol use: No    Alcohol/week: 0.0 standard drinks of alcohol   Drug use: No   Sexual activity: Not on file  Other Topics Concern   Not on file  Social History Narrative   TOBACCO USE CIGARETTES: NEVER SMOKED.NO SMOKING.NO ALCOHOL .CAFFEINE YES:NO  RECREATIONAL DRUGS. OCCUPATION :RETIRED   MARTIAL STATUS : MARRIED    Social  Determinants of Health   Financial Resource Strain: Not on file  Food Insecurity: Not on file  Transportation Needs: No Transportation Needs (07/31/2018)   PRAPARE - Administrator, Civil Service (Medical): No    Lack of Transportation (Non-Medical): No  Physical Activity: Not on file  Stress: Not on file  Social Connections: Not on file  Intimate Partner Violence: Not on file    Past Surgical History:  Procedure Laterality Date   CARDIAC CATHETERIZATION     remote >15 years ago   CHOLECYSTECTOMY N/A 03/23/2019   Procedure: LAPAROSCOPIC CHOLECYSTECTOMY;  Surgeon: Abigail Miyamoto, MD;  Location: [facility] OR;  Service: General;  Laterality: N/A;   IR GASTROSTOMY TUBE REMOVAL  02/24/2017   IR GENERIC HISTORICAL   10/11/2015   IR PATIENT EVAL TECH 0-60 MINS 10/11/2015 Darrell K Allred, PA-C [facility]   IR PATIENT EVAL TECH 0-60 MINS  07/05/2016   IR PATIENT EVAL TECH 0-60 MINS  08/14/2016   IR PATIENT EVAL TECH 0-60 MINS  12/09/2016   IR REMOVAL TUN ACCESS W/ PORT W/O FL MOD SED  07/22/2016   VASECTOMY  1975      Current Outpatient Medications:    amLODipine (NORVASC) 5 MG tablet, Take 5 mg by mouth daily., Disp: , Rfl:    apixaban (ELIQUIS) 5 MG TABS tablet, Take 1 tablet (5 mg total) by mouth 2 (two) times daily., Disp: 60 tablet, Rfl: 11   Cholecalciferol (VITAMIN D) 50 MCG (2000 UT) tablet, Take 2,000 Units by mouth daily., Disp: , Rfl:    diphenhydrAMINE (BENADRYL) 25 MG tablet, Take 25 mg by mouth at bedtime., Disp: , Rfl:    finasteride (PROSCAR) 5 MG tablet, Take 5 mg by mouth daily., Disp: , Rfl:    folic acid (FOLVITE) 1 MG tablet, Take 1 mg by mouth daily. , Disp: , Rfl:    omeprazole (PRILOSEC) 20 MG capsule, Take 20 mg by mouth daily., Disp: , Rfl:    predniSONE (DELTASONE) 10 MG tablet, Take 10 mg by mouth daily., Disp: , Rfl:    terazosin (HYTRIN) 2 MG capsule, Take 2 mg by mouth 2 (two) times daily. , Disp: , Rfl:    vitamin B-12 (CYANOCOBALAMIN) 1000 MCG tablet, Take 1,000 mcg by mouth daily. Chewable, Disp: , Rfl:  No current facility-administered medications for this visit.  Facility-Administered Medications Ordered in Other Visits:    regadenoson (LEXISCAN) injection SOLN 0.4 mg, 0.4 mg, Intravenous, Once, Croitoru, Mihai, MD    Physical Exam: Blood pressure 114/70, pulse 67, height 6' (1.829 m), weight 192 lb 12.8 oz (87.5 kg), SpO2 97%.    Affect appropriate Elderly male  HEENT: normal Neck supple with no adenopathy JVP normal no bruits no thyromegaly Lungs clear with no wheezing and good diaphragmatic motion Heart:  S1/S2 AV sclerosis murmur, no rub, gallop or click PMI normal Abdomen: benighn, BS positve, no tenderness, no AAA no bruit.  No HSM or HJR post lap  choly Distal pulses intact with no bruits No edema Neuro non-focal Skin warm and dry No muscular weakness   Labs:   Lab Results  Component Value Date   WBC 8.1 11/27/2021   HGB 13.7 11/27/2021   HCT 40.9 11/27/2021   MCV 98.6 11/27/2021   PLT 188 11/27/2021    No results for input(s): "NA", "K", "CL", "CO2", "BUN", "CREATININE", "CALCIUM", "PROT", "BILITOT", "ALKPHOS", "ALT", "AST", "GLUCOSE" in the last 168 hours.  Invalid input(s): "LABALBU"   Radiology: No results found.  EKG: SR rate 66 normal 05/07/19 09/20/2022 SR rate 86 variable P wave and wenkebach   ASSESSMENT AND PLAN:   1. PAF:  Short lived during admission for cholecystitis Spontaneous conversion Seen by EP Dr Ladona Ridgel indicated would not anticoagulate Was on beta blocker but d/c with wenkebach and vagal reaction while in hospitial for CBD stone No symptoms and normal LV function on echo Monitor confirms recurrent PAF with SSS AV block and pauses. Started on eliquis 06/2019  Discussed stroke risk   Will update monitor   2. HTN:  Well controlled.  Continue current medications and low sodium Dash type diet.    3. Bradycardia:  follows with GT no symptoms able to reach max HR of 150 bpm on stress test 06/17/19   4. Preoperative:  ok to hold eliquis 2 days before and baby ASA 5 days before dental procedure   5. 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

## 2022-09-20 ENCOUNTER — Encounter: Payer: Self-pay | Admitting: Cardiovascular Disease

## 2022-09-20 ENCOUNTER — Ambulatory Visit (INDEPENDENT_AMBULATORY_CARE_PROVIDER_SITE_OTHER): Payer: Medicare HMO

## 2022-09-20 ENCOUNTER — Ambulatory Visit: Payer: Medicare HMO | Attending: Cardiovascular Disease | Admitting: Cardiovascular Disease

## 2022-09-20 VITALS — BP 114/70 | HR 67 | Ht 72.0 in | Wt 192.8 lb

## 2022-09-20 DIAGNOSIS — R001 Bradycardia, unspecified: Secondary | ICD-10-CM

## 2022-09-20 DIAGNOSIS — I480 Paroxysmal atrial fibrillation: Secondary | ICD-10-CM

## 2022-09-20 NOTE — Progress Notes (Unsigned)
Enrolled patient for a 14 day Zio XT  monitor to be mailed to patients home  °

## 2022-09-20 NOTE — Patient Instructions (Addendum)
Medication Instructions:  Your physician recommends that you continue on your current medications as directed. Please refer to the Current Medication list given to you today.  *If you need a refill on your cardiac medications before your next appointment, please call your pharmacy*  Lab Work: If you have labs (blood work) drawn today and your tests are completely normal, you will receive your results only by: MyChart Message (if you have MyChart) OR A paper copy in the mail If you have any lab test that is abnormal or we need to change your treatment, we will call you to review the results.  Testing/Procedures: Your physician has recommended that you wear a 14 day monitor in August. Monitors are medical devices that record the heart's electrical activity. Doctors most often [place] these monitors to diagnose arrhythmias. Arrhythmias are problems with the speed or rhythm of the heartbeat. The monitor is a small, portable device. You can wear one while you do your normal daily activities. This is usually used to diagnose what is causing palpitations/syncope (passing out).   Follow-Up: At [facility], you and your health needs are our priority.  As part of our continuing mission to provide you with exceptional heart care, we have created designated Provider Care Teams.  These Care Teams include your primary Cardiologist (physician) and Advanced Practice Providers (APPs -  Physician Assistants and Nurse Practitioners) who all work together to provide you with the care you need, when you need it.  We recommend signing up for the patient portal called "MyChart".  Sign up information is provided on this After Visit Summary.  MyChart is used to connect with patients for Virtual Visits (Telemedicine).  Patients are able to view lab/test results, encounter notes, upcoming appointments, etc.  Non-urgent messages can be sent to your provider as well.   To learn more about what you can do with MyChart, go  to [email or web address].    Your next appointment:   1 year(s)  Provider:   Charlton Haws, MD     Christena Deem- Long Term Monitor Instructions  Your physician has requested you wear a ZIO patch monitor for 14 days.  This is a single patch monitor. [employer] supplies one patch monitor per enrollment. Additional stickers are not available. Please do not apply patch if you will be having a Nuclear Stress Test,  Echocardiogram, Cardiac CT, MRI, or Chest Xray during the period you would be wearing the  monitor. The patch cannot be worn during these tests. You cannot remove and re-apply the  ZIO XT patch monitor.  Your ZIO patch monitor will be mailed 3 day USPS to your address on file. It may take 3-5 days  to receive your monitor after you have been enrolled.  Once you have received your monitor, please review the enclosed instructions. Your monitor  has already been registered assigning a specific monitor serial # to you.  Billing and Patient Assistance Program Information  We have supplied [employer] with any of your insurance information on file for billing purposes. [employer] offers a sliding scale Patient Assistance Program for patients that do not have  insurance, or whose insurance does not completely cover the cost of the ZIO monitor.  You must apply for the Patient Assistance Program to qualify for this discounted rate.  To apply, please call [employer] at [phone], select option 4, select option 2, ask to apply for  Patient Assistance Program. [name] will ask your household income, and how many people  are in your household.  They will quote your out-of-pocket cost based on that information.  [employer] will also be able to set up a 59-month, interest-free payment plan if needed.  Applying the monitor   Shave hair from upper left chest.  Hold abrader disc by orange tab. Rub abrader in 40 strokes over the upper left chest as  indicated in your monitor instructions.  Clean area with 4  enclosed alcohol pads. Let dry.  Apply patch as indicated in monitor instructions. Patch will be placed under collarbone on left  side of chest with arrow pointing upward.  Rub patch adhesive wings for 2 minutes. Remove white label marked "1". Remove the white  label marked "2". Rub patch adhesive wings for 2 additional minutes.  While looking in a mirror, press and release button in center of patch. A small green light will  flash 3-4 times. This will be your only indicator that the monitor has been turned on.  Do not shower for the first 24 hours. You may shower after the first 24 hours.  Press the button if you feel a symptom. You will hear a small click. Record Date, Time and  Symptom in the Patient Logbook.  When you are ready to remove the patch, follow instructions on the last 2 pages of Patient  Logbook. Stick patch monitor onto the last page of Patient Logbook.  Place Patient Logbook in the blue and white box. Use locking tab on box and tape box closed  securely. The blue and white box has prepaid postage on it. Please place it in the mailbox as  soon as possible. Your physician should have your test results approximately 7 days after the  monitor has been mailed back to [facility].  Call [facility] Customer Care at [phone] if you have questions regarding  your ZIO XT patch monitor. Call them immediately if you see an orange light blinking on your  monitor.  If your monitor falls off in less than 4 days, contact our Monitor department at [phone].  If your monitor becomes loose or falls off after 4 days call [employer] at [phone] for  suggestions on securing your monitor

## 2022-10-15 DIAGNOSIS — I480 Paroxysmal atrial fibrillation: Secondary | ICD-10-CM | POA: Diagnosis not present

## 2022-11-04 ENCOUNTER — Telehealth: Payer: Self-pay | Admitting: *Deleted

## 2022-11-04 NOTE — Telephone Encounter (Signed)
-----   Message from [facility] Danielle G sent at 10/29/2022  4:19 PM EDT ----- This pt is coming in to see MSW on Tuesday, August 27 for monitor.  Pt wore monitor in July and Orpha Bur has not received monitor nor has it been mailed. Call Monday and make sure pt is not coming in for a different reason.  Move appt if pt hasn't mailed monitor.

## 2022-11-04 NOTE — Telephone Encounter (Signed)
Placed call to pt.  Per wife, monitor was mailed back 10/29/2022.  I have sent Shelly a message to see if it had been received, and if not, will call pt to reschedule.

## 2022-11-05 ENCOUNTER — Telehealth: Payer: Self-pay | Admitting: Nurse Practitioner

## 2022-11-05 ENCOUNTER — Ambulatory Visit: Payer: Medicare HMO | Admitting: Nurse Practitioner

## 2022-11-05 ENCOUNTER — Encounter: Payer: Self-pay | Admitting: Nurse Practitioner

## 2022-11-05 VITALS — BP 124/70 | HR 70 | Ht 72.0 in | Wt 196.8 lb

## 2022-11-05 DIAGNOSIS — I10 Essential (primary) hypertension: Secondary | ICD-10-CM

## 2022-11-05 DIAGNOSIS — I480 Paroxysmal atrial fibrillation: Secondary | ICD-10-CM | POA: Diagnosis not present

## 2022-11-05 DIAGNOSIS — Z7901 Long term (current) use of anticoagulants: Secondary | ICD-10-CM

## 2022-11-05 DIAGNOSIS — I495 Sick sinus syndrome: Secondary | ICD-10-CM | POA: Diagnosis not present

## 2022-11-05 DIAGNOSIS — E785 Hyperlipidemia, unspecified: Secondary | ICD-10-CM | POA: Diagnosis not present

## 2022-11-05 NOTE — Progress Notes (Signed)
Cardiology Office Note:  .   Date:  11/05/2022  ID:  HAJIME Hanson, DOB [birth year], MRN [id number] PCP: Emilio Aspen, MD  [facility] Providers Cardiologist:  Charlton Haws, MD Electrophysiologist:  Lewayne Bunting, MD    Patient Profile: .      PMH PAF Former tobacco abuse Hypertension Bradycardia Throat cancer Completed XRT and chemo 10/2015   First seen by cardiology 03/22/2019 during admission. History of remote cardiac cath, no reported history of CAD. Had PAF during admission for cholecystitis with spontaneous conversion. Was not anticoagulated at the time due to hematuria but was treated with beta blocker. TTE at that time revealed EF 65 to 70%, MAC AV sclerosis, normal atrium size. He later had cardiac monitor that revealed recurrent PAF with SSS AV block and pauses.  He was started on Eliquis 06/2019.  Seen by EP cardiology for evaluation of tachy bradycardia syndrome and although he met criteria for PPM, he was asymptomatic. He was off beta-blocker at that time. He had low risk stress test 06/17/2019.   Last cardiology clinic visit was 09/20/2022 with Dr. Eden Emms at which time updated cardiac monitor was ordered. Zio completed 10/29/22 revealed predominant underlying rhythm sinus rhythm with minimal HR 21 bpm, average HR 65 bpm.  First-degree AV block, AF with RVR, 119 pauses the longest lasting 6 seconds second-degree AV block Mobitz type I. Awaiting official read by MD.        History of Present Illness: .   Randy Hanson is a very pleasant 82 y.o. male who is here today for follow-up of cardiac monitor results. Results as noted above, discussed with Dr. Anne Fu, DOD who recommends that we refer back to Dr. Ladona Ridgel, EP for management of tachy-brady syndrome. He reports he has been feeling well. Had one episode of feeling like he was going to pass out about 5-6 weeks ago. Had been doing a lot of physical work that day and got symptomatic getting out of the shower. No further  episodes since that time. Was chopping wood and doing other heavy labor while wearing the heart monitor, no symptoms. Admits he may be more fatigued than he was 6 months ago, but no significant symptoms. No palpitations that he can recall recently. He denies chest pain, shortness of breath, edema, orthopnea, PND, syncope. We discussed elevated cholesterol readings. He did not tolerate simvastatin, had joint pain  ROS: See HPI       Studies Reviewed: .         Risk Assessment/Calculations:    CHA2DS2-VASc Score = 3   This indicates a 3.2% annual risk of stroke. The patient's score is based upon: CHF History: 0 HTN History: 1 Diabetes History: 0 Stroke History: 0 Vascular Disease History: 0 Age Score: 2 Gender Score: 0            Physical Exam:   VS:  BP 124/70   Pulse 70   Ht 6' (1.829 m)   Wt 196 lb 12.8 oz (89.3 kg)   SpO2 96%   BMI 26.69 kg/m    Wt Readings from Last 3 Encounters:  11/05/22 196 lb 12.8 oz (89.3 kg)  09/20/22 192 lb 12.8 oz (87.5 kg)  02/25/22 199 lb (90.3 kg)    GEN: Well nourished, well developed in no acute distress NECK: No JVD; No carotid bruits CARDIAC: RRR, no murmurs, rubs, gallops RESPIRATORY:  Clear to auscultation without rales, wheezing or rhonchi  ABDOMEN: Soft, non-tender, non-distended EXTREMITIES:  No edema; No  deformity     ASSESSMENT AND PLAN: .    PAF on chronic anticoagulation: HR is well controlled today. Monitor results with evidence of a fib with RVR reviewed. He is not AV nodal blocking agents 2/2 bradycardia. Nuisance bleeding on his arms and legs because he is very active but no significant bleeding. We will continue Eliquis 5 mg twice daily for stroke prevention for CHA2DS2-VASc score of 3.   Tachy-brady syndrome: Cardiac monitor completed 10/29/2022 revealed 2nd degree AV block Mobitz type 1, bradycardia, and 6-second pauses. He has had infrequent symptoms of presyncope and fatigue. Will refer to EP for consideration of  pacemaker.   Dyslipidemia: LDL 127 on 05/16/22. Reviewed increased risk of ASCVD with elevated LDL. Had joint pain while taking simvastatin but is agreeable to consider a different statin if LDL remains elevated above 100. We will check FLP in a few weeks when he can return fasting.   Hypertension: BP is well controlled. No medication changes today.        Dispo: Refer to Dr. Ladona Ridgel, EP/6 months with Dr. Eden Emms  Signed, Eligha Bridegroom, NP-C

## 2022-11-05 NOTE — Patient Instructions (Signed)
Medication Instructions:   Your physician recommends that you continue on your current medications as directed. Please refer to the Current Medication list given to you today.   *If you need a refill on your cardiac medications before your next appointment, please call your pharmacy*   Lab Work:  Your physician recommends that you return for a FASTING lipid profile on Wednesday, September 25. You can come in on the day of your appointment anytime between 7:30-4:30 fasting from midnight the night before.     If you have labs (blood work) drawn today and your tests are completely normal, you will receive your results only by: MyChart Message (if you have MyChart) OR A paper copy in the mail If you have any lab test that is abnormal or we need to change your treatment, we will call you to review the results.   Testing/Procedures:  None ordered.   Follow-Up: At [facility], you and your health needs are our priority.  As part of our continuing mission to provide you with exceptional heart care, we have created designated Provider Care Teams.  These Care Teams include your primary Cardiologist (physician) and Advanced Practice Providers (APPs -  Physician Assistants and Nurse Practitioners) who all work together to provide you with the care you need, when you need it.  We recommend signing up for the patient portal called "MyChart".  Sign up information is provided on this After Visit Summary.  MyChart is used to connect with patients for Virtual Visits (Telemedicine).  Patients are able to view lab/test results, encounter notes, upcoming appointments, etc.  Non-urgent messages can be sent to your provider as well.   To learn more about what you can do with MyChart, go to [email or web address].    Your next appointment:   3 month(s)  Provider:   Lewayne Bunting, MD    Other Instructions  You have been referred to the EP department with Dr. Ladona Ridgel in November.

## 2022-11-05 NOTE — Telephone Encounter (Signed)
Received monitor results today.

## 2022-11-05 NOTE — Telephone Encounter (Signed)
Irving Burton from [facility] is calling to report a critical result

## 2022-11-05 NOTE — Telephone Encounter (Addendum)
Irving Burton calling from [facility]  Patient wore monitor from 8/6-8/20  119 pause longest 6 sec Strip 5 pg 19 Strip 7 pg 20  82 episodes of high grade AV block  All episodes together 7 mins 2 runs of Vtach 2 runs of SVT  Patient has appointment with Marcelino Duster today and Dr. Anne Fu (DOD) is aware of monitor results. Its placed in his box by [facility] for review.

## 2022-11-26 ENCOUNTER — Inpatient Hospital Stay: Payer: Medicare HMO | Attending: Hematology & Oncology

## 2022-11-26 ENCOUNTER — Other Ambulatory Visit: Payer: Self-pay

## 2022-11-26 ENCOUNTER — Encounter: Payer: Self-pay | Admitting: Hematology & Oncology

## 2022-11-26 ENCOUNTER — Inpatient Hospital Stay: Payer: Medicare HMO | Admitting: Hematology & Oncology

## 2022-11-26 VITALS — BP 128/56 | HR 59 | Temp 97.8°F | Resp 16 | Wt 193.0 lb

## 2022-11-26 DIAGNOSIS — Z9221 Personal history of antineoplastic chemotherapy: Secondary | ICD-10-CM | POA: Insufficient documentation

## 2022-11-26 DIAGNOSIS — Z923 Personal history of irradiation: Secondary | ICD-10-CM | POA: Diagnosis not present

## 2022-11-26 DIAGNOSIS — C099 Malignant neoplasm of tonsil, unspecified: Secondary | ICD-10-CM

## 2022-11-26 DIAGNOSIS — R682 Dry mouth, unspecified: Secondary | ICD-10-CM | POA: Diagnosis not present

## 2022-11-26 DIAGNOSIS — Z85818 Personal history of malignant neoplasm of other sites of lip, oral cavity, and pharynx: Secondary | ICD-10-CM | POA: Diagnosis not present

## 2022-11-26 DIAGNOSIS — Z79899 Other long term (current) drug therapy: Secondary | ICD-10-CM | POA: Insufficient documentation

## 2022-11-26 LAB — CBC WITH DIFFERENTIAL (CANCER CENTER ONLY)
Abs Immature Granulocytes: 0.03 10*3/uL (ref 0.00–0.07)
Basophils Absolute: 0 10*3/uL (ref 0.0–0.1)
Basophils Relative: 0 %
Eosinophils Absolute: 0.1 10*3/uL (ref 0.0–0.5)
Eosinophils Relative: 2 %
HCT: 40.7 % (ref 39.0–52.0)
Hemoglobin: 13.6 g/dL (ref 13.0–17.0)
Immature Granulocytes: 0 %
Lymphocytes Relative: 10 %
Lymphs Abs: 0.7 10*3/uL (ref 0.7–4.0)
MCH: 32.9 pg (ref 26.0–34.0)
MCHC: 33.4 g/dL (ref 30.0–36.0)
MCV: 98.3 fL (ref 80.0–100.0)
Monocytes Absolute: 0.6 10*3/uL (ref 0.1–1.0)
Monocytes Relative: 9 %
Neutro Abs: 5.2 10*3/uL (ref 1.7–7.7)
Neutrophils Relative %: 79 %
Platelet Count: 201 10*3/uL (ref 150–400)
RBC: 4.14 MIL/uL — ABNORMAL LOW (ref 4.22–5.81)
RDW: 14.4 % (ref 11.5–15.5)
WBC Count: 6.7 10*3/uL (ref 4.0–10.5)
nRBC: 0 % (ref 0.0–0.2)

## 2022-11-26 LAB — TSH: TSH: 1.492 u[IU]/mL (ref 0.350–4.500)

## 2022-11-26 LAB — CMP (CANCER CENTER ONLY)
ALT: 24 U/L (ref 0–44)
AST: 25 U/L (ref 15–41)
Albumin: 4.1 g/dL (ref 3.5–5.0)
Alkaline Phosphatase: 49 U/L (ref 38–126)
Anion gap: 7 (ref 5–15)
BUN: 18 mg/dL (ref 8–23)
CO2: 35 mmol/L — ABNORMAL HIGH (ref 22–32)
Calcium: 10.3 mg/dL (ref 8.9–10.3)
Chloride: 102 mmol/L (ref 98–111)
Creatinine: 1.36 mg/dL — ABNORMAL HIGH (ref 0.61–1.24)
GFR, Estimated: 53 mL/min — ABNORMAL LOW (ref >60)
Glucose, Bld: 104 mg/dL — ABNORMAL HIGH (ref 70–99)
Potassium: 3.9 mmol/L (ref 3.5–5.1)
Sodium: 144 mmol/L (ref 135–145)
Total Bilirubin: 0.7 mg/dL (ref 0.3–1.2)
Total Protein: 6.1 g/dL — ABNORMAL LOW (ref 6.5–8.1)

## 2022-11-26 NOTE — Progress Notes (Signed)
Hematology and Oncology Follow Up Visit  EGE MAVITY [id number] 1941-01-15 82 y.o. 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 Ariano is here today for a follow-up.  Everything is going quite well for him.  He does tell me that when my patient is fell and broke her arm.  I will have to give her a call and see what is going on.  He has had a decent summer.  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  He still has a dry mouth.  He is able to eat and swallow okay.   Of note, his last TSH was 2.3.  He has had no change in bowel or bladder habits.  He has had no bleeding.  He has had no rashes.  There is been no leg swelling.  Overall, I would say that his performance status is probably ECOG 1.      Medications:  Allergies as of 11/26/2022       Reactions   Lisinopril Cough   Acyclovir And Related Other (See Comments)   Fever, chills   Famciclovir Other (See Comments)   Fever, chills   Simvastatin Other (See Comments)        Medication List        Accurate as of November 26, 2022 10:45 AM. If you have any questions, ask your nurse or doctor.          amLODipine 5 MG tablet Commonly known as: NORVASC Take 5 mg by mouth daily.   apixaban 5 MG Tabs tablet Commonly known as: Eliquis Take 1 tablet (5 mg total) by mouth 2 (two) times daily.   finasteride 5 MG tablet Commonly known as: PROSCAR Take 5 mg by mouth daily.   folic acid 1 MG tablet Commonly known as: FOLVITE Take 1 mg by mouth daily.   omeprazole 20 MG capsule Commonly known as: PRILOSEC Take 20 mg by mouth daily.   predniSONE 10 MG tablet Commonly known as: DELTASONE Take 10 mg by mouth daily.   terazosin 2 MG capsule Commonly known as: HYTRIN Take 2 mg by mouth 2 (two) times daily.   Vitamin D 50 MCG (2000 UT)  tablet Take 2,000 Units by mouth daily.        Allergies:  Allergies  Allergen Reactions   Lisinopril Cough   Acyclovir And Related Other (See Comments)    Fever, chills    Famciclovir Other (See Comments)    Fever, chills   Simvastatin Other (See Comments)    Past Medical History, Surgical history, Social history, and Family History were reviewed and updated.  Review of Systems: Review of Systems  Constitutional: Negative.   HENT: Negative.    Eyes: Negative.   Respiratory: Negative.    Cardiovascular: Negative.   Gastrointestinal: Negative.   Genitourinary: Negative.   Musculoskeletal: Negative.   Skin: Negative.   Neurological: Negative.   Endo/Heme/Allergies: Negative.   Psychiatric/Behavioral: Negative.       Physical Exam:  weight is 193 lb (87.5 kg). His oral temperature is 97.8 F (36.6 C). His blood pressure is 128/56 (abnormal) and his pulse is 59 (abnormal). His respiration is 16 and oxygen saturation is 100%.   Wt Readings from Last 3 Encounters:  11/26/22 193 lb (87.5 kg)  11/05/22 196  lb 12.8 oz (89.3 kg)  09/20/22 192 lb 12.8 oz (87.5 kg)    Physical Exam Vitals reviewed.  HENT:     Head: Normocephalic and atraumatic.  Eyes:     Pupils: Pupils are equal, round, and reactive to light.  Cardiovascular:     Rate and Rhythm: Normal rate and regular rhythm.     Heart sounds: Normal heart sounds.  Pulmonary:     Effort: Pulmonary effort is normal.     Breath sounds: Normal breath sounds.  Abdominal:     General: Bowel sounds are normal.     Palpations: Abdomen is soft.     Comments: Abdominal exam shows a soft abdomen.  He has well-healed laparoscopic scars.  He has decent bowel sounds.  There is no guarding or rebound tenderness.  Musculoskeletal:        General: No tenderness or deformity. Normal range of motion.     Cervical back: Normal range of motion.  Lymphadenopathy:     Cervical: No cervical adenopathy.  Skin:    General: Skin is  warm and dry.     Findings: No erythema or rash.  Neurological:     Mental Status: He is alert and oriented to person, place, and time.  Psychiatric:        Behavior: Behavior normal.        Thought Content: Thought content normal.        Judgment: Judgment normal.      Lab Results  Component Value Date   WBC 6.7 11/26/2022   HGB 13.6 11/26/2022   HCT 40.7 11/26/2022   MCV 98.3 11/26/2022   PLT 201 11/26/2022   No results found for: "FERRITIN", "IRON", "TIBC", "UIBC", "IRONPCTSAT" Lab Results  Component Value Date   RBC 4.14 (L) 11/26/2022   No results found for: "KPAFRELGTCHN", "LAMBDASER", "KAPLAMBRATIO" No results found for: "IGGSERUM", "IGA", "IGMSERUM" No results found for: "TOTALPROTELP", "ALBUMINELP", "A1GS", "A2GS", "BETS", "BETA2SER", "GAMS", "MSPIKE", "SPEI"   Chemistry      Component Value Date/Time   NA 144 11/26/2022 0954   NA 146 (H) 02/20/2017 1007   NA 140 07/04/2016 1014   K 3.9 11/26/2022 0954   K 4.7 02/20/2017 1007   K 4.7 07/04/2016 1014   CL 102 11/26/2022 0954   CL 104 02/20/2017 1007   CO2 35 (H) 11/26/2022 0954   CO2 31 02/20/2017 1007   CO2 27 07/04/2016 1014   BUN 18 11/26/2022 0954   BUN 14 02/20/2017 1007   BUN 22.1 07/04/2016 1014   CREATININE 1.36 (H) 11/26/2022 0954   CREATININE 1.1 02/20/2017 1007   CREATININE 1.0 07/04/2016 1014      Component Value Date/Time   CALCIUM 10.3 11/26/2022 0954   CALCIUM 9.8 02/20/2017 1007   CALCIUM 10.6 (H) 07/04/2016 1014   ALKPHOS 49 11/26/2022 0954   ALKPHOS 72 02/20/2017 1007   ALKPHOS 255 (H) 07/04/2016 1014   AST 25 11/26/2022 0954   AST 28 07/04/2016 1014   ALT 24 11/26/2022 0954   ALT 17 02/20/2017 1007   ALT 55 07/04/2016 1014   BILITOT 0.7 11/26/2022 0954   BILITOT 0.80 07/04/2016 1014     Impression and Plan: [name] is a very pleasant 82 year old  white male with stage I  HPV+ - squamous cell carcinoma the right tonsil. He completed radiation and chemotherapy in early August,  2017.   I see no evidence of recurrence of his malignancy.  He is now 7 years.  At this point, I think we will let him go from the practice.  It has not been 7 years.  I really do not think that his cancer is going to come back.  His cancer was HPV positive.  He had a wonderful response to treatment.  He knows that he can always come back to see [place] if there are any issues. Marland Kitchen    [name], MD 9/17/202410:45 AM

## 2022-12-04 ENCOUNTER — Ambulatory Visit: Payer: Medicare HMO | Attending: Nurse Practitioner

## 2022-12-04 DIAGNOSIS — Z7901 Long term (current) use of anticoagulants: Secondary | ICD-10-CM | POA: Diagnosis not present

## 2022-12-04 DIAGNOSIS — E785 Hyperlipidemia, unspecified: Secondary | ICD-10-CM

## 2022-12-04 DIAGNOSIS — I495 Sick sinus syndrome: Secondary | ICD-10-CM

## 2022-12-04 DIAGNOSIS — I480 Paroxysmal atrial fibrillation: Secondary | ICD-10-CM

## 2022-12-04 NOTE — Progress Notes (Unsigned)
Electrophysiology Office Note:   Date:  12/05/2022  ID:  Randy Hanson, DOB 02-Sep-1940, MRN [id number]  Primary Cardiologist: Randy Haws, MD Electrophysiologist: Nobie Putnam, MD      History of Present Illness:   Randy Hanson is a 82 y.o. 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 Eden Emms.  Patient recently wore a ZIO monitor which revealed self-limited episodes of SVT as well as bradycardia with pauses up to 6 seconds and second-degree AV block Mobitz type I t only during sleeping hours.  He has a known history of atrial fibrillation as well.  The patient, however, reports feeling no concrete symptoms of fast heart rates.  He has occasional dizziness upon standing from a seated position.  He does have some exertional dyspnea, but is unsure if this correlates at all with heart rate/rhythm and seems to be chronic.  He reports no limitations in doing the things that he enjoys and tries to remain active. When wearing the Zio monitor he was unaware of the symptom trigger feature so he did not document any of the symptoms.  Review of systems complete and found to be negative unless listed in HPI.   EP Information / Studies Reviewed:    EKG is ordered today. Personal review as below.  EKG Interpretation Date/Time:  Thursday December 05 2022 15:14:32 EDT Ventricular Rate:  60 PR Interval:  228 QRS Duration:  94 QT Interval:  386 QTC Calculation: 386 R Axis:   56  Text Interpretation: Sinus rhythm with 1st degree A-V block with Non-conducted atrial premature complexes Confirmed by Nobie Putnam [phone]) on 12/05/2022 4:12:00 PM  Echocardiogram 03/22/2019: Normal LV size and function.  LVEF 65 to 70%. Grade 1 diastolic dysfunction. Normal left and right atrial  size. Mild to moderate MAC, otherwise no significant valvular disease.  Nuclear Stress 06/17/19:  Normal LV EF, 55 to 65%. Normal study.  Risk Assessment/Calculations:    CHA2DS2-VASc Score = 3   This indicates a 3.2% annual risk of stroke. The patient's score is based upon: CHF History: 0 HTN History: 1 Diabetes History: 0 Stroke History: 0 Vascular Disease History: 0 Age Score: 2 Gender Score: 0             Physical Exam:   VS:  BP 138/84   Pulse 60   Ht 6' (1.829 m)   Wt 195 lb (88.5 kg)   SpO2 97%   BMI 26.45 kg/m    Wt Readings from Last 3 Encounters:  12/05/22 195 lb (88.5 kg)  11/26/22 193 lb (87.5 kg)  11/05/22 196 lb 12.8 oz (89.3 kg)     GEN: Well nourished, well developed in no acute distress NECK: No JVD; No carotid bruits CARDIAC: Regular rate and rhythm, no murmurs, rubs, gallops RESPIRATORY:  Clear to auscultation without rales, wheezing or rhonchi  ABDOMEN: Soft, non-tender, non-distended EXTREMITIES:  No edema; No deformity   ASSESSMENT AND PLAN:   Randy Hanson is a 82 y.o. 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 Eden Emms.  Patient appears to only physiologic nocturnal bradycardia.  He denies any symptoms of slow heart rates.  In the past he has been able to adequately increase his heart rate for activity and showed sinus rates on his Zio up to 138 bpm.  Currently, it seems that he is asymptomatic from his atrial fibrillation and intermittent fast heart rates but he cannot say this with confidence. These episodes are often very short and self-limiting.  He is not completely sure if his symptoms of intermittent dizziness or exertional shortness of breath could be related to a heart rate/rhythm problem.  When wearing the Zio he was  unaware of the triggering feature.  Without symptoms of elevated heart rates and need for medications to suppress this, there would be no pacing indication for physiologic nocturnal bradycardia alone that is asymptomatic.  Given that he is on chronic oral prednisone for many years, if he needed a pacemaker he would be at a higher risk for infection and for bleeding/pocket hematoma due to poor tissue integrity as well as need for oral anticoagulation.  We discussed risk and benefits of pacemaker implantation in his current clinical scenario and we collectively decided that we would like to gather more evidence before proceeding to that step given the aforementioned the risks.  #. Paroxysmal atrial fibrillation #. Tachy-brady syndrome #. Nocturnal bradycardia - Continue to avoid any rate controlling or AV nodal blocking agents. - Continue Eliquis for elevated CHA2DS2-VASc. - We will repeat the ZIO monitor.  I have instructed him regarding the symptom trigger feature.  He will then see me back in a month to review these results.  If he does have symptoms of atrial fibrillation or fast heart rates that would require medications to treat adequately, then we will proceed with pacemaker for true tachy-brady syndrome.  If he is truly asymptomatic and his bradycardia remains isolated to sleep, then permanent pacing is not indicated.   Follow up with Dr. Jimmey Ralph in 4 weeks   Total time of encounter: 84 minutes total time of encounter, including face-to-face patient care, coordination of care and counseling regarding high complexity medical decision making.   Signed, Nobie Putnam, MD

## 2022-12-05 ENCOUNTER — Ambulatory Visit: Payer: Medicare HMO | Admitting: Cardiology

## 2022-12-05 ENCOUNTER — Encounter: Payer: Self-pay | Admitting: Cardiology

## 2022-12-05 ENCOUNTER — Ambulatory Visit: Payer: Medicare HMO | Attending: Cardiology

## 2022-12-05 VITALS — BP 138/84 | HR 60 | Ht 72.0 in | Wt 195.0 lb

## 2022-12-05 DIAGNOSIS — R001 Bradycardia, unspecified: Secondary | ICD-10-CM

## 2022-12-05 DIAGNOSIS — I495 Sick sinus syndrome: Secondary | ICD-10-CM

## 2022-12-05 DIAGNOSIS — I480 Paroxysmal atrial fibrillation: Secondary | ICD-10-CM

## 2022-12-05 LAB — LIPID PANEL
Chol/HDL Ratio: 2.7 ratio (ref 0.0–5.0)
Cholesterol, Total: 224 mg/dL — ABNORMAL HIGH (ref 100–199)
HDL: 84 mg/dL (ref >39)
LDL Chol Calc (NIH): 127 mg/dL — ABNORMAL HIGH (ref 0–99)
Triglycerides: 73 mg/dL (ref 0–149)
VLDL Cholesterol Cal: 13 mg/dL (ref 5–40)

## 2022-12-05 NOTE — Progress Notes (Unsigned)
Applied a 14 day Zio XT monitor to patient in the office ?

## 2022-12-05 NOTE — Patient Instructions (Addendum)
Medication Instructions:  Your physician recommends that you continue on your current medications as directed. Please refer to the Current Medication list given to you today.  *If you need a refill on your cardiac medications before your next appointment, please call your pharmacy*  Testing/Procedures: Your physician has recommended that you wear an event monitor. Event monitors are medical devices that record the heart's electrical activity. Doctors most often [place] these monitors to diagnose arrhythmias. Arrhythmias are problems with the speed or rhythm of the heartbeat. The monitor is a small, portable device. You can wear one while you do your normal daily activities. This is usually used to diagnose what is causing palpitations/syncope (passing out).  Follow-Up: At [facility], you and your health needs are our priority.  As part of our continuing mission to provide you with exceptional heart care, we have created designated Provider Care Teams.  These Care Teams include your primary Cardiologist (physician) and Advanced Practice Providers (APPs -  Physician Assistants and Nurse Practitioners) who all work together to provide you with the care you need, when you need it.  Your next appointment:   4-6 weeks  Provider:   Nobie Putnam, MD    Other Instructions Randy Hanson- Long Term Monitor Instructions  Your physician has requested you wear a ZIO patch monitor for 10 days.  This is a single patch monitor. [employer] supplies one patch monitor per enrollment. Additional stickers are not available. Please do not apply patch if you will be having a Nuclear Stress Test,  Echocardiogram, Cardiac CT, MRI, or Chest Xray during the period you would be wearing the  monitor. The patch cannot be worn during these tests. You cannot remove and re-apply the  ZIO XT patch monitor.  Your ZIO patch monitor will be mailed 3 day USPS to your address on file. It may take 3-5 days  to receive your monitor  after you have been enrolled.  Once you have received your monitor, please review the enclosed instructions. Your monitor  has already been registered assigning a specific monitor serial # to you.  Billing and Patient Assistance Program Information  We have supplied [employer] with any of your insurance information on file for billing purposes. [employer] offers a sliding scale Patient Assistance Program for patients that do not have  insurance, or whose insurance does not completely cover the cost of the ZIO monitor.  You must apply for the Patient Assistance Program to qualify for this discounted rate.  To apply, please call [employer] at [phone], select option 4, select option 2, ask to apply for  Patient Assistance Program. [name] will ask your household income, and how many people  are in your household. They will quote your out-of-pocket cost based on that information.  [employer] will also be able to set up a 61-month, interest-free payment plan if needed.  Applying the monitor   Shave hair from upper left chest.  Hold abrader disc by orange tab. Rub abrader in 40 strokes over the upper left chest as  indicated in your monitor instructions.  Clean area with 4 enclosed alcohol pads. Let dry.  Apply patch as indicated in monitor instructions. Patch will be placed under collarbone on left  side of chest with arrow pointing upward.  Rub patch adhesive wings for 2 minutes. Remove white label marked "1". Remove the white  label marked "2". Rub patch adhesive wings for 2 additional minutes.  While looking in a mirror, press and release button in center of patch.  A small green light will  flash 3-4 times. This will be your only indicator that the monitor has been turned on.  Do not shower for the first 24 hours. You may shower after the first 24 hours.  Press the button if you feel a symptom. You will hear a small click. Record Date, Time and  Symptom in the Patient Logbook.  When you are  ready to remove the patch, follow instructions on the last 2 pages of Patient  Logbook. Stick patch monitor onto the last page of Patient Logbook.  Place Patient Logbook in the blue and white box. Use locking tab on box and tape box closed  securely. The blue and white box has prepaid postage on it. Please place it in the mailbox as  soon as possible. Your physician should have your test results approximately 7 days after the  monitor has been mailed back to [facility].  Call [facility] Customer Care at [phone] if you have questions regarding  your ZIO XT patch monitor. Call them immediately if you see an orange light blinking on your  monitor.  If your monitor falls off in less than 4 days, contact our Monitor department at [phone].  If your monitor becomes loose or falls off after 4 days call [employer] at [phone] for  suggestions on securing your monitor

## 2022-12-27 DIAGNOSIS — I480 Paroxysmal atrial fibrillation: Secondary | ICD-10-CM | POA: Diagnosis not present

## 2023-01-05 DIAGNOSIS — I480 Paroxysmal atrial fibrillation: Secondary | ICD-10-CM

## 2023-01-08 ENCOUNTER — Encounter: Payer: Self-pay | Admitting: Cardiology

## 2023-01-08 ENCOUNTER — Ambulatory Visit: Payer: Medicare HMO | Attending: Cardiology | Admitting: Cardiology

## 2023-01-08 ENCOUNTER — Telehealth: Payer: Self-pay | Admitting: *Deleted

## 2023-01-08 VITALS — BP 132/76 | HR 66 | Ht 72.0 in | Wt 196.4 lb

## 2023-01-08 DIAGNOSIS — D6869 Other thrombophilia: Secondary | ICD-10-CM | POA: Diagnosis not present

## 2023-01-08 DIAGNOSIS — R001 Bradycardia, unspecified: Secondary | ICD-10-CM | POA: Diagnosis not present

## 2023-01-08 DIAGNOSIS — I480 Paroxysmal atrial fibrillation: Secondary | ICD-10-CM | POA: Diagnosis not present

## 2023-01-08 DIAGNOSIS — I441 Atrioventricular block, second degree: Secondary | ICD-10-CM

## 2023-01-08 NOTE — Progress Notes (Signed)
Electrophysiology Office Note:   Date:  01/08/2023  ID:  Randy Hanson, DOB 05/15/1940, MRN [id number]  Primary Cardiologist: Charlton Haws, MD Electrophysiologist: Nobie Putnam, MD      History of Present Illness:   Randy Hanson is a 82 y.o. 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 Eden Emms.  Patient recently wore a ZIO monitor which revealed self-limited episodes of SVT as well as bradycardia with pauses up to 6 seconds and second-degree AV block Mobitz type I t only during sleeping hours.  He has a known history of atrial fibrillation as well.  The patient, however, reports feeling no concrete symptoms of fast heart rates.  He has occasional dizziness upon standing from a seated position.  He does have some exertional dyspnea, but is unsure if this correlates at all with heart rate/rhythm and seems to be chronic.  He reports no limitations in doing the things that he enjoys and tries to remain active. When wearing the Zio monitor he was unaware of the symptom trigger feature so he did not document any of the symptoms.  Interval History: Patient reports doing relatively well since his last clinic visit.  He continues to have intermittent episodes of dizziness and lightheadedness, which he states may have increased in frequency since his visit, although he acknowledges that he may just be more aware to look for this after our last discussion.  He has not had any syncopal episodes.  He continues to do his activity as much as tolerated.  He does state that when he really needs to " get going quickly" that he has some fatigue or limitation.    Review of systems complete and found to be negative unless listed in HPI.   EP Information / Studies Reviewed:    EKG is ordered today.  Personal review as below.  EKG Interpretation Date/Time:  Wednesday January 08 2023 14:16:21 EDT Ventricular Rate:  66 PR Interval:  208 QRS Duration:  94 QT Interval:  362 QTC Calculation: 379 R Axis:   66  Text Interpretation: Normal sinus rhythm Non-conducted premature atrial complexes. Nonspecific ST abnormality When compared with ECG of 05-Dec-2022 15:14,more nonconducted PACs present today. Confirmed by Nobie Putnam [phone]) on 01/08/2023 6:08:19 PM  Echocardiogram 03/22/2019: Normal LV size and function.  LVEF 65 to 70%. Grade 1 diastolic dysfunction. Normal left and right atrial size. Mild to moderate MAC, otherwise no significant valvular disease.  Nuclear Stress 06/17/19:  Normal LV EF, 55 to 65%. Normal study.  Risk Assessment/Calculations:    CHA2DS2-VASc Score = 3   This indicates a 3.2% annual risk of stroke. The patient's score is based upon: CHF History: 0 HTN History: 1 Diabetes History: 0 Stroke History: 0 Vascular Disease History: 0 Age Score: 2 Gender Score: 0        STOP-Bang Score:  3       Physical Exam:   VS:  BP 132/76   Pulse 66   Ht 6' (1.829 m)   Wt 196 lb 6.4 oz (89.1 kg)   SpO2 97%   BMI 26.64 kg/m    Wt Readings from Last 3 Encounters:  01/08/23 196 lb 6.4 oz (89.1 kg)  12/05/22 195 lb (88.5 kg)  11/26/22 193 lb (87.5 kg)     GEN: Well nourished, well developed in  no acute distress NECK: No JVD; No carotid bruits CARDIAC: Normal rate, irregular rhythm.  RESPIRATORY:  Clear to auscultation without rales, wheezing or rhonchi  ABDOMEN: Soft, non-tender, non-distended EXTREMITIES:  No edema; No deformity   ASSESSMENT AND PLAN:   Randy Hanson is a 82 y.o. 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  Eden Emms.  Patient does have physiologic nocturnal bradycardia.  On his most recent Zio monitor, he did trigger for symptoms during the day that looks like AV Wenckebach at faster heart rates.  This might suggest a level of chronotropic incompetence.  He does acknowledge that at times when he tries to exert himself he feels limited or fatigued.  Given that he is on chronic oral prednisone for many years, if he needed a pacemaker he would be at a higher risk for infection and for bleeding/pocket hematoma due to poor tissue integrity as well as need for oral anticoagulation.  We discussed risk and benefits of pacemaker implantation in his current clinical scenario and we collectively decided that we would like to continue gathering more evidence before proceeding to that step given the aforementioned the risks.  #. Paroxysmal atrial fibrillation #. Tachy-brady syndrome #. Nocturnal bradycardia #. ?Chronotropic incompetence and symptomatic AV Wenckebach - Continue to avoid any rate controlling or AV nodal blocking agents. - Continue Eliquis for elevated CHA2DS2-VASc. - Treadmill exercise test to assess for chronotropic incompetence and AV Wenckebach cycle length. - Sleep study to look for sleep apnea.   Total time of encounter: 60 minutes total time of encounter, including chart review, face-to-face patient care, coordination of care and counseling regarding high complexity medical decision making.   Signed, Nobie Putnam, MD

## 2023-01-08 NOTE — Patient Instructions (Signed)
Medication Instructions:  Your physician recommends that you continue on your current medications as directed. Please refer to the Current Medication list given to you today. *If you need a refill on your cardiac medications before your next appointment, please call your pharmacy*   Testing/Procedures: Sleep Study Your physician has recommended that you have a sleep study. This test records several body functions during sleep, including: brain activity, eye movement, oxygen and carbon dioxide blood levels, heart rate and rhythm, breathing rate and rhythm, the flow of air through your mouth and nose, snoring, body muscle movements, and chest and belly movement.  Treadmill Stress Test Your physician has requested that you have an exercise tolerance test. For further information please visit [email or web address] Please also follow instruction sheet, as given.   Follow-Up: At [facility], you and your health needs are our priority.  As part of our continuing mission to provide you with exceptional heart care, we have created designated Provider Care Teams.  These Care Teams include your primary Cardiologist (physician) and Advanced Practice Providers (APPs -  Physician Assistants and Nurse Practitioners) who all work together to provide you with the care you need, when you need it.  We recommend signing up for the patient portal called "MyChart".  Sign up information is provided on this After Visit Summary.  MyChart is used to connect with patients for Virtual Visits (Telemedicine).  Patients are able to view lab/test results, encounter notes, upcoming appointments, etc.  Non-urgent messages can be sent to your provider as well.   To learn more about what you can do with MyChart, go to [email or web address].    Your next appointment:   6 month(s)  Provider:   Nobie Putnam, MD

## 2023-01-08 NOTE — Telephone Encounter (Signed)
DR. Nobie Putnam ORDERED ITAMAR SLEEP STUDY.   Patient agreement reviewed and signed on 01/08/2023.  WatchPAT issued to patient on 01/08/2023 by Danielle Rankin, CMA. Patient aware to not open the WatchPAT box until contacted with the activation PIN. Patient profile initialized in CloudPAT on 01/08/2023 by Danielle Rankin, CMA. Device serial number: [id number]  Please list Reason for Call as Advice Only and type "WatchPAT issued to patient" in the comment box.

## 2023-01-13 ENCOUNTER — Ambulatory Visit: Payer: Medicare HMO | Admitting: Internal Medicine

## 2023-01-30 ENCOUNTER — Ambulatory Visit: Payer: Medicare HMO | Attending: Cardiology

## 2023-01-30 DIAGNOSIS — I480 Paroxysmal atrial fibrillation: Secondary | ICD-10-CM | POA: Diagnosis not present

## 2023-01-30 LAB — EXERCISE TOLERANCE TEST
Angina Index: 0
Duke Treadmill Score: 6
Estimated workload: 7.2
Exercise duration (min): 6 min
Exercise duration (sec): 0 s
MPHR: 141 {beats}/min
Peak HR: 134 {beats}/min
Percent HR: 95 %
RPE: 15
Rest HR: 62 {beats}/min
ST Depression (mm): 0 mm

## 2023-05-22 DIAGNOSIS — Z0000 Encounter for general adult medical examination without abnormal findings: Secondary | ICD-10-CM | POA: Diagnosis not present

## 2023-05-22 DIAGNOSIS — Z1331 Encounter for screening for depression: Secondary | ICD-10-CM | POA: Diagnosis not present

## 2023-06-26 DIAGNOSIS — Z113 Encounter for screening for infections with a predominantly sexual mode of transmission: Secondary | ICD-10-CM | POA: Diagnosis not present

## 2023-07-09 DIAGNOSIS — H6121 Impacted cerumen, right ear: Secondary | ICD-10-CM | POA: Diagnosis not present

## 2023-07-09 DIAGNOSIS — H9313 Tinnitus, bilateral: Secondary | ICD-10-CM | POA: Diagnosis not present

## 2023-07-09 DIAGNOSIS — H9011 Conductive hearing loss, unilateral, right ear, with unrestricted hearing on the contralateral side: Secondary | ICD-10-CM | POA: Diagnosis not present

## 2023-09-05 DIAGNOSIS — N401 Enlarged prostate with lower urinary tract symptoms: Secondary | ICD-10-CM | POA: Diagnosis not present

## 2023-09-05 DIAGNOSIS — N3941 Urge incontinence: Secondary | ICD-10-CM | POA: Diagnosis not present

## 2023-09-05 DIAGNOSIS — R310 Gross hematuria: Secondary | ICD-10-CM | POA: Diagnosis not present

## 2023-10-02 DIAGNOSIS — C009 Malignant neoplasm of lip, unspecified: Secondary | ICD-10-CM | POA: Diagnosis not present

## 2023-10-24 ENCOUNTER — Emergency Department (HOSPITAL_BASED_OUTPATIENT_CLINIC_OR_DEPARTMENT_OTHER)

## 2023-10-24 ENCOUNTER — Encounter (HOSPITAL_BASED_OUTPATIENT_CLINIC_OR_DEPARTMENT_OTHER): Payer: Self-pay

## 2023-10-24 ENCOUNTER — Other Ambulatory Visit: Payer: Self-pay

## 2023-10-24 ENCOUNTER — Emergency Department (HOSPITAL_BASED_OUTPATIENT_CLINIC_OR_DEPARTMENT_OTHER)
Admission: EM | Admit: 2023-10-24 | Discharge: 2023-10-24 | Disposition: A | Discharge status: Home / Self Care | Attending: Emergency Medicine | Admitting: Emergency Medicine

## 2023-10-24 DIAGNOSIS — Z87891 Personal history of nicotine dependence: Secondary | ICD-10-CM | POA: Diagnosis not present

## 2023-10-24 DIAGNOSIS — Z7901 Long term (current) use of anticoagulants: Secondary | ICD-10-CM | POA: Insufficient documentation

## 2023-10-24 DIAGNOSIS — R1012 Left upper quadrant pain: Secondary | ICD-10-CM | POA: Diagnosis not present

## 2023-10-24 DIAGNOSIS — I10 Essential (primary) hypertension: Secondary | ICD-10-CM | POA: Insufficient documentation

## 2023-10-24 DIAGNOSIS — N281 Cyst of kidney, acquired: Secondary | ICD-10-CM | POA: Diagnosis not present

## 2023-10-24 DIAGNOSIS — R0789 Other chest pain: Secondary | ICD-10-CM | POA: Diagnosis not present

## 2023-10-24 DIAGNOSIS — N200 Calculus of kidney: Secondary | ICD-10-CM | POA: Diagnosis not present

## 2023-10-24 DIAGNOSIS — R0602 Shortness of breath: Secondary | ICD-10-CM | POA: Diagnosis not present

## 2023-10-24 DIAGNOSIS — R072 Precordial pain: Secondary | ICD-10-CM | POA: Diagnosis not present

## 2023-10-24 DIAGNOSIS — N2882 Megaloureter: Secondary | ICD-10-CM | POA: Diagnosis not present

## 2023-10-24 DIAGNOSIS — Z79899 Other long term (current) drug therapy: Secondary | ICD-10-CM | POA: Insufficient documentation

## 2023-10-24 DIAGNOSIS — C760 Malignant neoplasm of head, face and neck: Secondary | ICD-10-CM | POA: Diagnosis not present

## 2023-10-24 LAB — CBC
HCT: 36.9 % — ABNORMAL LOW (ref 39.0–52.0)
Hemoglobin: 12.4 g/dL — ABNORMAL LOW (ref 13.0–17.0)
MCH: 32.6 pg (ref 26.0–34.0)
MCHC: 33.6 g/dL (ref 30.0–36.0)
MCV: 97.1 fL (ref 80.0–100.0)
Platelets: 169 K/uL (ref 150–400)
RBC: 3.8 MIL/uL — ABNORMAL LOW (ref 4.22–5.81)
RDW: 13.6 % (ref 11.5–15.5)
WBC: 6.5 K/uL (ref 4.0–10.5)
nRBC: 0 % (ref 0.0–0.2)

## 2023-10-24 LAB — COMPREHENSIVE METABOLIC PANEL WITH GFR
ALT: 16 U/L (ref 0–44)
AST: 20 U/L (ref 15–41)
Albumin: 3.9 g/dL (ref 3.5–5.0)
Alkaline Phosphatase: 57 U/L (ref 38–126)
Anion gap: 12 (ref 5–15)
BUN: 16 mg/dL (ref 8–23)
CO2: 24 mmol/L (ref 22–32)
Calcium: 10.2 mg/dL (ref 8.9–10.3)
Chloride: 106 mmol/L (ref 98–111)
Creatinine, Ser: 1.4 mg/dL — ABNORMAL HIGH (ref 0.61–1.24)
GFR, Estimated: 51 mL/min — ABNORMAL LOW (ref >60)
Glucose, Bld: 123 mg/dL — ABNORMAL HIGH (ref 70–99)
Potassium: 4.3 mmol/L (ref 3.5–5.1)
Sodium: 142 mmol/L (ref 135–145)
Total Bilirubin: 0.3 mg/dL (ref 0.0–1.2)
Total Protein: 5.9 g/dL — ABNORMAL LOW (ref 6.5–8.1)

## 2023-10-24 LAB — URINALYSIS, ROUTINE W REFLEX MICROSCOPIC
Bilirubin Urine: NEGATIVE
Glucose, UA: NEGATIVE mg/dL
Hgb urine dipstick: NEGATIVE
Ketones, ur: NEGATIVE mg/dL
Leukocytes,Ua: NEGATIVE
Nitrite: NEGATIVE
Protein, ur: NEGATIVE mg/dL
Specific Gravity, Urine: 1.005 (ref 1.005–1.030)
pH: 7 (ref 5.0–8.0)

## 2023-10-24 LAB — TROPONIN T, HIGH SENSITIVITY
Troponin T High Sensitivity: <15 ng/L (ref 0–19)
Troponin T High Sensitivity: 15 ng/L (ref 0–19)

## 2023-10-24 LAB — LIPASE, BLOOD: Lipase: 29 U/L (ref 11–51)

## 2023-10-24 MED ORDER — ACETAMINOPHEN 500 MG PO TABS
1000.0000 mg | ORAL_TABLET | Freq: Once | ORAL | Status: AC
  Administered 2023-10-24: 1000 mg via ORAL
  Filled 2023-10-24: qty 2

## 2023-10-24 MED ORDER — HYDROMORPHONE HCL 1 MG/ML IJ SOLN: 0.5000 mg | Freq: Once | INTRAMUSCULAR | Status: DC

## 2023-10-24 MED ORDER — IOHEXOL 350 MG/ML SOLN
100.0000 mL | Freq: Once | INTRAVENOUS | Status: AC | PRN
  Administered 2023-10-24: 100 mL via INTRAVENOUS

## 2023-10-24 MED ORDER — ONDANSETRON HCL 4 MG/2ML IJ SOLN: 4.0000 mg | Freq: Once | INTRAMUSCULAR | Status: DC

## 2023-10-24 NOTE — Discharge Instructions (Addendum)
 Workup for the chest and abdominal discomfort without any acute findings.  Heart markers were normal no signs of any blood clots in the lungs.  Lungs were normal.  And abdomen without any acute findings.  Follow-up with your doctor.  You could try a heating pad to see if it helps.  But definitely need to follow-up with your doctor

## 2023-10-24 NOTE — ED Provider Notes (Addendum)
 [place] EMERGENCY DEPARTMENT AT [facility] Provider Note   CSN: [id number] Arrival date & time: 10/24/23  1631     Patient presents with: Abdominal Pain and Back Pain   Randy Hanson is a 83 y.o. male.   The patient with 3-day history of left upper quadrant abdominal pain that radiates to his back.  Also has left lower anterior chest discomfort as well.  Patient was concerned that he had pulled something where his G-tube used to be.  But that is been out since 2017.  Associated with some shortness of breath but no nausea vomiting or diarrhea.  No fevers.  Past medical history significant hypertension esophageal reflux high cholesterol hyperlipidemia history of radiation therapy in 2017 for right tonsillar area and bilateral neck.  Patient is a former smoker quit 1982.  Was planning to get a chest x-ray will probably do CT angio since we get a get IV contrast for the abdominal pain as well just to rule out PE.  Will do CT angio oxygen saturation 92% on room air not tachycardic.       Prior to Admission medications   Medication Sig Start Date End Date Taking? Authorizing Provider  amLODipine (NORVASC) 5 MG tablet Take 5 mg by mouth daily. 02/25/22   [provider]  apixaban  (ELIQUIS ) 5 MG TABS tablet Take 1 tablet (5 mg total) by mouth 2 (two) times daily. 06/10/19   Nishan, Peter C, MD  Cholecalciferol (VITAMIN D) 50 MCG (2000 UT) tablet Take 2,000 Units by mouth daily.    [provider]  finasteride (PROSCAR) 5 MG tablet Take 5 mg by mouth daily. 01/28/22   [provider]  folic acid  (FOLVITE ) 1 MG tablet Take 1 mg by mouth daily.     [provider]  methotrexate (RHEUMATREX) 2.5 MG tablet Take 10 mg by mouth once a week. 08/20/22   [provider]  MYRBETRIQ 50 MG TB24 tablet Take 50 mg by mouth daily. 10/23/22   [provider]  omeprazole (PRILOSEC) 20 MG capsule Take 20 mg by mouth daily.    [provider]  predniSONE  (DELTASONE ) 10 MG tablet Take 10 mg by mouth daily. 02/08/20   [provider]  terazosin  (HYTRIN ) 2 MG capsule Take 2 mg by mouth 2 (two) times daily.     [provider]    Allergies: Lisinopril, Acyclovir and related, Famciclovir, and Simvastatin    Review of Systems  Constitutional:  Negative for chills and fever.  HENT:  Negative for ear pain and sore throat.   Eyes:  Negative for pain and visual disturbance.  Respiratory:  Positive for shortness of breath. Negative for cough.   Cardiovascular:  Positive for chest pain. Negative for palpitations.  Gastrointestinal:  Positive for abdominal pain. Negative for vomiting.  Genitourinary:  Negative for dysuria and hematuria.  Musculoskeletal:  Negative for arthralgias and back pain.  Skin:  Negative for color change and rash.  Neurological:  Negative for seizures and syncope.  All other systems reviewed and are negative.   Updated Vital Signs BP (!) 163/86   Pulse (!) 55   Temp 98 F (36.7 C) (Oral)   Resp 18   Ht 1.829 m (6')   Wt 86.2 kg   SpO2 96%   BMI 25.77 kg/m   Physical Exam Vitals and nursing note reviewed.  Constitutional:      General: He is not in acute distress.    Appearance: Normal appearance. He  is well-developed.  HENT:     Head: Normocephalic and atraumatic.  Eyes:     Conjunctiva/sclera: Conjunctivae normal.  Cardiovascular:     Rate and Rhythm: Normal rate and regular rhythm.     Heart sounds: No murmur heard. Pulmonary:     Effort: Pulmonary effort is normal. No respiratory distress.     Breath sounds: Normal breath sounds. No wheezing or rales.  Abdominal:     Palpations: Abdomen is soft.     Tenderness: There is abdominal tenderness. There is no guarding.  Musculoskeletal:        General: No swelling.     Cervical back: Normal range of motion and neck supple.  Skin:    General: Skin is warm and dry.     Capillary Refill: Capillary refill takes less than 2  seconds.  Neurological:     General: No focal deficit present.     Mental Status: He is alert and oriented to person, place, and time.  Psychiatric:        Mood and Affect: Mood normal.     (all labs ordered are listed, but only abnormal results are displayed) Labs Reviewed  COMPREHENSIVE METABOLIC PANEL WITH GFR - Abnormal; Notable for the following components:      Result Value   Glucose, Bld 123 (*)    Creatinine, Ser 1.40 (*)    Total Protein 5.9 (*)    GFR, Estimated 51 (*)    All other components within normal limits  CBC - Abnormal; Notable for the following components:   RBC 3.80 (*)    Hemoglobin 12.4 (*)    HCT 36.9 (*)    All other components within normal limits  URINALYSIS, ROUTINE W REFLEX MICROSCOPIC - Abnormal; Notable for the following components:   Color, Urine COLORLESS (*)    All other components within normal limits  LIPASE, BLOOD  TROPONIN T, HIGH SENSITIVITY    EKG: EKG Interpretation Date/Time:  Friday October 24 2023 16:39:53 EDT Ventricular Rate:  63 PR Interval:  244 QRS Duration:  88 QT Interval:  394 QTC Calculation: 403 R Axis:   56  Text Interpretation: Sinus rhythm with marked sinus arrhythmia with 1st degree A-V block Otherwise normal ECG When compared with ECG of 08-Jan-2023 14:16, PR interval has increased No significant change since last tracing Confirmed by Eriel Doyon [phone]) on 10/24/2023 6:02:15 PM  Radiology: No results found.   Procedures   Medications Ordered in the ED - No data to display                                  Medical Decision Making Amount and/or Complexity of Data Reviewed Labs: ordered. Radiology: ordered.  Risk OTC drugs. Prescription drug management.   Will get troponins.  EKG without any acute findings.  Labs are essentially normal other than creatinine at 1.40 but GFR is 50.  So can receive dye.  Will go ahead and do CT angio to rule out PE as well as getting CT abdomen pelvis with  contrast.  Patient's LFTs normal lipase normal.  White count normal hemoglobin 12.4 platelets at 169 and urinalysis is negative for urinary tract infection.  Troponins x 2 are 15 or less than 15.  Lipase 29 complete metabolic panel other than GFR as mentioned above.  CT abdomen and pelvis and CT angio chest no evidence of pulmonary embolism no acute pulmonary findings.  No acute  findings in the abdomen pelvis.  Will treat patient symptomatically.  Patient stable for discharge home follow-up with his doctor.  Patient does not want a narcotic pain medicine.    Final diagnoses:  Precordial pain  Left upper quadrant abdominal pain    ED Discharge Orders     None          Geraldene Hamilton, MD 10/24/23 [id number]    Geraldene Hamilton, MD 10/24/23 [id number]    Geraldene Hamilton, MD 10/24/23 2212

## 2023-10-24 NOTE — ED Triage Notes (Signed)
 Pt reports LUQ abd pain where feeding tube used to be radiating to back. Pt unsure if he over exerted himself or not.

## 2023-10-28 DIAGNOSIS — B029 Zoster without complications: Secondary | ICD-10-CM | POA: Diagnosis not present

## 2023-11-27 ENCOUNTER — Telehealth: Payer: Self-pay

## 2023-11-27 NOTE — Telephone Encounter (Signed)
**Note De-Identified Randy Hanson Obfuscation** Ordering provider: Dr Kennyth Associated diagnoses: A-fib-I48.0 and HTN-I10  WatchPAT PA obtained on 11/27/2023 by Maritssa Haughton, Avelina HERO, LPN. Authorization: Per the Water quality scientist at [place], a [place] is not required for CPT Code: 04199 ([place]amar-HST)  The patient and his wife was NOT notified of PIN ([phone]) on 11/27/2023 Seger Jani Notification Method: phone. The pts wife states that the pt is no longer interested in doing the WatchPAT One-home sleep study. She states that she is calling back to schedule the pt a f/u with Dr Kennyth and that they will bring the device back to us  at that time.  Phone note routed to Dr Kennyth and his covering staff for follow-up.

## 2023-12-01 ENCOUNTER — Encounter: Payer: Self-pay | Admitting: Gastroenterology

## 2024-01-22 ENCOUNTER — Encounter: Payer: Self-pay | Admitting: Hematology & Oncology

## 2024-01-22 ENCOUNTER — Ambulatory Visit (INDEPENDENT_AMBULATORY_CARE_PROVIDER_SITE_OTHER): Admitting: Gastroenterology

## 2024-01-22 ENCOUNTER — Encounter: Payer: Self-pay | Admitting: Gastroenterology

## 2024-01-22 VITALS — BP 128/68 | HR 66 | Ht 72.0 in | Wt 193.4 lb

## 2024-01-22 DIAGNOSIS — R1319 Other dysphagia: Secondary | ICD-10-CM

## 2024-01-22 DIAGNOSIS — Z7902 Long term (current) use of antithrombotics/antiplatelets: Secondary | ICD-10-CM

## 2024-01-22 NOTE — Progress Notes (Signed)
 [facility] Consult Note:  History: Randy Hanson 01/22/2024  Referring provider: Charlott Dorn LABOR, MD  Reason for consult/chief complaint: Dysphagia (Patient states when he eats rice or cornbread it gets stuck in his throat. Had his last EGD at the [facility] in [place] 5 years ago. Patient currently takes Eliquis . Hx of Tonsil cancer.)   Subjective  Prior history:  Referred from local [facility] GI clinic after visit they are 11/24/2023.  (32 pages of records reviewed)  They indicate this patient had a history of tonsillar cancer treated with chemotherapy and radiation in 2017.  He had a gastrostomy tube which was subsequently removed.  Intermittent dysphagia treated with EGD and dilation by a provider at [facility] sometime in the last several years. Colonoscopy at [facility] September 2015 with no polyps, 10-year recall recommended.  ([facility] indicates patient was not interested in repeat colonoscopy) Having recurrent dysphagia, referred for community care. Atrial fibrillation on Eliquis  (no [facility] the referral) On methotrexate for lichen planus   Discussed the use of AI scribe software for clinical note transcription with the patient, who gave verbal consent to proceed.  History of Present Illness Randy Hanson is an 83 year old male with a history of tonsillar cancer who presents with difficulty swallowing.  Dysphagia - Difficulty swallowing since treatment for tonsillar cancer in 2017 - Recurrent dysphagia, particularly with foods such as cornbread, rice, and meat, which become lodged high in the throat - Requires coughing to clear food obstruction at times - Needs to eat slowly and carefully - Attended swallowing therapy  - No unexpected weight loss; maintains weight at approximately 190 pounds - Good appetite - Feeding tube previously in place, now removed - Multiple prior upper endoscopies, most  recent over two years ago at an outside institution.  Neither he nor his wife could recall what had been discovered or if a dilation was performed.  History of tonsillar cancer and treatment effects - Diagnosed with tonsillar cancer in 2017 - Treated with chemotherapy and radiation therapy - Post-treatment complications include persistent dysphagia  Medication use for lichen planus - Currently taking methotrexate and prednisone  for lichen planus  Cardiac symptoms and history - History of atrial fibrillation - Underwent a stress test approximately one year ago - Recalled discussion regarding possible need for pacemaker due to episodes of low heart rate during sleep, but no further evaluation pursued - Shortness of breath with exertion    ROS:  Review of Systems  Constitutional:  Negative for appetite change and unexpected weight change.  HENT:  Negative for mouth sores and voice change.   Eyes:  Negative for pain and redness.  Respiratory:  Positive for shortness of breath. Negative for cough.   Cardiovascular:  Negative for chest pain and palpitations.  Genitourinary:  Negative for dysuria and hematuria.  Musculoskeletal:  Negative for arthralgias and myalgias.  Skin:  Negative for pallor and rash.  Neurological:  Negative for weakness and headaches.  Hematological:  Negative for adenopathy.     Past Medical History: Past Medical History:  Diagnosis Date   Back pain    BPH (benign prostatic hyperplasia)    Cancer of tonsil, palatine (HCC) 07/12/2015   DJD (degenerative joint disease)    Esophageal reflux    FHx: colonic polyps    Hemorrhoids    History of radiation therapy 08/28/15-- 10/16/15   Right Tonsil and bilateral neck   Hypercholesteremia  Hyperlipemia    Hypertension    Kidney cysts    [facility]   Posterior vitreous detachment, left eye    DR. KATHERINE HECKER  AUGUST 2011   Radiation 08/28/15- 10/16/15   Right Tonsil and  Bilateral Neck   He sees Dr. Delford of [facility] cardiology, and was also seen by Dr. Fonda Kitty on 01/30/2023 for his A-fib and periodic bradycardia ZIO monitor reportedly revealed self-limited episodes of SVT as well as bradycardia with pauses up to 6 seconds and second-degree AV block Mobitz type I while sleeping.  He was reporting occasional dizziness upon standing from seated position.  There was consideration of a pacemaker, though apparently some concern about the possibility of infection in the pacemaker pocket in a patient on chronic prednisone .  He was also scheduled for a sleep study to rule out sleep apnea.    ED visit 10/24/2023 for chest pain -reports were reviewed, troponin was negative x 2, mildly elevated creatinine 1.4, CMP otherwise normal.  CT abdomen and pelvis with CT angio chest no evidence of PE or other acute findings.  Phone note through cardiology clinic on 11/27/2023 indicates patient was being set up for clinic follow-up with them.  (Appointment scheduled with them tomorrow) Patient is also apparently going to turn in results of his sleep study tomorrow.  Past Surgical History: Past Surgical History:  Procedure Laterality Date   CARDIAC CATHETERIZATION     remote >15 years ago   CHOLECYSTECTOMY N/A 03/23/2019   Procedure: LAPAROSCOPIC CHOLECYSTECTOMY;  Surgeon: Vernetta Berg, MD;  Location: [facility] OR;  Service: General;  Laterality: N/A;   IR GASTROSTOMY TUBE REMOVAL  02/24/2017   IR GENERIC HISTORICAL  10/11/2015   IR PATIENT EVAL TECH 0-60 MINS 10/11/2015 Darrell K Allred, PA-C [facility]-INTERV RAD   IR PATIENT EVAL TECH 0-60 MINS  07/05/2016   IR PATIENT EVAL TECH 0-60 MINS  08/14/2016   IR PATIENT EVAL TECH 0-60 MINS  12/09/2016   IR REMOVAL TUN ACCESS W/ PORT W/O FL MOD SED  07/22/2016   VASECTOMY  1975     Family History: Family History  Problem Relation Age of Onset   Hypertension Mother     Social History: Social History   Socioeconomic History   Marital  status: Married    Spouse name: Not on file   Number of children: Not on file   Years of education: Not on file   Highest education level: Not on file  Occupational History   Not on file  Tobacco Use   Smoking status: Former    Types: Cigarettes, Cigars    Quit date: 03/14/1980    Years since quitting: 43.8   Smokeless tobacco: Never   Tobacco comments:    quit 35 years ago  Vaping Use   Vaping status: Never Used  Substance and Sexual Activity   Alcohol use: No    Alcohol/week: 0.0 standard drinks of alcohol   Drug use: No   Sexual activity: Not on file  Other Topics Concern   Not on file  Social History Narrative   TOBACCO USE CIGARETTES: NEVER SMOKED.NO SMOKING.NO ALCOHOL .CAFFEINE YES:NO  RECREATIONAL DRUGS. OCCUPATION :RETIRED   MARTIAL STATUS : MARRIED    Social Drivers of Corporate Investment Banker Strain: Not on file  Food Insecurity: Not on file  Transportation Needs: No Transportation Needs (07/31/2018)   PRAPARE - Administrator, Civil Service (Medical): No    Lack of Transportation (  Non-Medical): No  Physical Activity: Not on file  Stress: Not on file  Social Connections: Not on file    Allergies: Allergies  Allergen Reactions   Lisinopril Cough   Acyclovir And Related Other (See Comments)    Fever, chills    Famciclovir Other (See Comments)    Fever, chills   Simvastatin Other (See Comments)    Outpatient Meds: Current Outpatient Medications  Medication Sig Dispense Refill   amLODipine (NORVASC) 5 MG tablet Take 5 mg by mouth daily.     apixaban  (ELIQUIS ) 5 MG TABS tablet Take 1 tablet (5 mg total) by mouth 2 (two) times daily. 60 tablet 11   Cholecalciferol (VITAMIN D) 50 MCG (2000 UT) tablet Take 2,000 Units by mouth daily.     diphenhydrAMINE  (BENADRYL  ALLERGY) 25 MG tablet Take 25 mg by mouth as needed for sleep.     famotidine (PEPCID) 20 MG tablet Take 20 mg by mouth as needed.     finasteride (PROSCAR) 5 MG tablet Take 5 mg by  mouth daily.     folic acid  (FOLVITE ) 1 MG tablet Take 1 mg by mouth daily.      methotrexate (RHEUMATREX) 2.5 MG tablet Take 10 mg by mouth once a week.     MYRBETRIQ 50 MG TB24 tablet Take 50 mg by mouth daily.     omeprazole (PRILOSEC) 20 MG capsule Take 20 mg by mouth daily.     polyethylene glycol powder (MIRALAX ) 17 GM/SCOOP powder Take 17 g by mouth daily.     predniSONE  (DELTASONE ) 10 MG tablet Take 10 mg by mouth daily.     terazosin  (HYTRIN ) 2 MG capsule Take 2 mg by mouth 2 (two) times daily.      No current facility-administered medications for this visit.      ___________________________________________________________________ Objective   Exam:  BP 128/68 (BP Location: Left Arm, Patient Position: Sitting, Cuff Size: Normal)   Pulse 66   Ht 6' (1.829 m)   Wt 193 lb 6 oz (87.7 kg)   BMI 26.23 kg/m  Wt Readings from Last 3 Encounters:  01/22/24 193 lb 6 oz (87.7 kg)  10/24/23 190 lb (86.2 kg)  01/08/23 196 lb 6.4 oz (89.1 kg)   Wife present for entire visit General: Well-appearing, normal vocal quality Eyes: sclera anicteric, no redness ENT: oral mucosa moist without lesions, no cervical or supraclavicular lymphadenopathy CV: Regular, no JVD, no peripheral edema Resp: clear to auscultation bilaterally, normal RR and effort noted GI: soft, no tenderness, with active bowel sounds. No guarding or palpable organomegaly noted. Skin; warm and dry, no rash or jaundice noted Neuro: awake, alert and oriented x 3. Normal gross motor function and fluent speech   Labs:  [facility] showed normal CBC except creatinine 1.35 and  normal CBC   Encounter Diagnoses  Name Primary?   Esophageal dysphagia Yes   Long term (current) use of antithrombotics/antiplatelets   Suspected radiation-induced esophageal stricture.  Assessment and Plan Assessment & Plan Suspect esophageal stricture secondary to radiation therapy for tonsillar  cancer Chronic esophageal stricture likely from radiation therapy, causing dysphagia with solids. Stricture likely in upper esophagus and may be quite tight, presenting some challenges and consideration of endoscopic dilation.   He had swallow therapy for what was probably some radiation-induced laryngeal dysfunction and disordered swallowing afterward. - Ordered barium swallow study to assess location and severity of suspected stricture.  This will help plan whether or not procedure may need to  be done in the hospital endoscopy lab, type of dilation and possible need for fluoroscopy. - Coordinate with cardiology to determine pacemaker necessity before endoscopy. - Plan upper endoscopy with dilation based on barium swallow and cardiology input.  Atrial fibrillation with bradycardia and pauses Atrial fibrillation with bradycardia and pauses, including a six-second pause. Previous cardiology evaluation suggested pacemaker, but he was reluctant. Concerns about infection risk due to immunosuppressive therapy. - Discuss pacemaker necessity and risks with cardiologist at upcoming appointment. - Ensure cardiology input before scheduling procedures requiring anesthesia.  Lichen planus on immunosuppressive therapy Lichen planus managed with methotrexate and prednisone . Concerns about increased infection risk with pacemaker placement due to immunosuppressive therapy. - Discuss infection risk with cardiologist regarding pacemaker.  Procedure not yet scheduled because he is seeing cardiology tomorrow and we need to understand where that workup is going and the possibility of him having a pacemaker done prior to an upper endoscopy.  Our anesthesia staff either at our office or in the hospital Endo lab would unquestionably want that evaluation first. Ron and his wife do understand that he needs an upper endoscopy and I am recommending it, we are just putting off the scheduling for now pending cardiology  evaluation.  With that evaluation we would also want to know the feasibility of him holding Eliquis  2 days before the procedure. Upper endoscopy would still remain an increased risk procedure for him given all those medical considerations, even with the preprocedure evaluations and considerations.  Meanwhile, caution warranted particularly with meat and ideally it should be ground.  Cut and chew food well, plenty of liquids with meals.    Thank you for the courtesy of this consult.  Please call me with any questions or concerns.   60 minutes were spent on this encounter, including in depth chart review, independent review of results as outlined above, communicating results with the patient directly, face-to-face time with the patient, coordinating care, ordering studies and medications as appropriate, and documentation.    Victory LITTIE Brand III  CC: Referring provider noted above

## 2024-01-22 NOTE — Progress Notes (Signed)
 Electrophysiology Office Note:   Date:  01/24/2024  ID:  Randy Hanson, DOB 1940/10/20, MRN [id number]  Primary Cardiologist: Maude Emmer, MD Electrophysiologist: Fonda Kitty, MD      History of Present Illness:   Randy Hanson is a 83 y.o. 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 Emmer.  Discussed the use of AI scribe software for clinical note transcription with the patient, who gave verbal consent to proceed.  History of Present Illness Randy Hanson is an 83 year old male who presents for a follow-up evaluation of slow heart rates.  He returns for a follow-up evaluation of his heart rate, having previously been assessed for slow heart rates a year ago. At that time, he underwent a treadmill test to evaluate his heart rate response.  He experiences shortness of breath when walking up inclines or stairs, such as in a parking garage. On flat surfaces, he can walk as needed without significant limitations. He mentions that on inclines, companions may need to wait for him occasionally.  He experiences dizziness and lightheadedness primarily when standing up from a seated position, but not to the extent of feeling like he might pass out. He does not report significant limitations in daily activities, as evidenced by his ability to rake leaves for about three hours recently.  He has not noticed any significant changes in his symptoms since the treadmill test a year ago.    EP Information / Studies Reviewed:    EKG is ordered today. Personal review as below.  EKG Interpretation Date/Time:  Friday January 23 2024 09:58:07 EST Ventricular Rate:  62 PR Interval:  222 QRS Duration:  96 QT Interval:  390 QTC Calculation: 395 R Axis:   28  Text  Interpretation: Sinus rhythm with marked sinus arrhythmia and blocked PACs Possible Inferior infarct , age undetermined When compared with ECG of 24-Oct-2023 16:39, Borderline criteria for Inferior infarct are now Present T wave inversion now evident in Inferior leads Confirmed by Kitty Fonda [phone]) on 01/24/2024 4:44:28 PM   Echocardiogram 03/22/2019: Normal LV size and function.  LVEF 65 to 70%. Grade 1 diastolic dysfunction. Normal left and right atrial size. Mild to moderate MAC, otherwise no significant valvular disease.  Nuclear Stress 06/17/19:  Normal LV EF, 55 to 65%. Normal study.  ETT 01/30/23:   Patient exercised for 6 minutes on the Bruce protocol achieving maximum heart rate of 134 bpm which was 95% of predicted maximum   There were occasional PVCs noted during stress and recovery.  No sustained arrhythmias detected.   Overall low risk exercise treadmill test with no electrocardiographic evidence of ischemia    Physical Exam:   VS:  BP 128/74   Ht 6' (1.829 m)   Wt 195 lb 12.8 oz (88.8 kg)   SpO2 95%   BMI 26.56 kg/m    Wt Readings from Last 3 Encounters:  01/23/24 195 lb 12.8 oz (88.8 kg)  01/22/24 193 lb 6 oz (87.7 kg)  10/24/23 190 lb (86.2 kg)     GEN: Well nourished, well developed in no acute distress NECK: No JVD; No carotid bruits CARDIAC: Normal rate, irregular rhythm.  RESPIRATORY:  Clear to auscultation without rales, wheezing or rhonchi  ABDOMEN: Soft, non-tender, non-distended EXTREMITIES:  No edema; No deformity   ASSESSMENT AND  PLAN:    #Paroxysmal atrial fibrillation #Secondary hypercoagulable state due to AF -Continue Eliquis  5mg  BID.    #. Tachy-brady syndrome #. Nocturnal bradycardia - Continue to avoid any rate controlling or AV nodal blocking agents. - Treadmill exercise test was done last year and did not show chronotropic incompetence. No changes in symptoms.  Follow up with primary cardiologist (Dr. Delford) regularly and EP as  needed.  Signed, Fonda Kitty, MD

## 2024-01-22 NOTE — Patient Instructions (Signed)
 You have been scheduled for a Barium Esophogram at [facility] (1st floor of the hospital) on 02/09/24 at 9 am. Please arrive 30 minutes prior to your appointment for registration. Make certain not to have anything to eat or drink 3 hours prior to your test. If you need to reschedule for any reason, please contact radiology at [phone] to do so. __________________________________________________________________ A barium swallow is an examination that concentrates on views of the esophagus. This tends to be a double contrast exam (barium and two liquids which, when combined, create a gas to distend the wall of the oesophagus) or single contrast (non-ionic iodine based). The study is usually tailored to your symptoms so a good history is essential. Attention is paid during the study to the form, structure and configuration of the esophagus, looking for functional disorders (such as aspiration, dysphagia, achalasia, motility and reflux) EXAMINATION You may be asked to change into a gown, depending on the type of swallow being performed. A radiologist and radiographer will perform the procedure. The radiologist will advise you of the type of contrast selected for your procedure and direct you during the exam. You will be asked to stand, sit or lie in several different positions and to hold a small amount of fluid in your mouth before being asked to swallow while the imaging is performed .In some instances you may be asked to swallow barium coated marshmallows to assess the motility of a solid food bolus. The exam can be recorded as a digital or video fluoroscopy procedure. POST PROCEDURE It will take 1-2 days for the barium to pass through your system. To facilitate this, it is important, unless otherwise directed, to increase your fluids for the next 24-48hrs and to resume your normal diet.  This test typically takes about 30 minutes to  perform. __________________________________________________________________________________

## 2024-01-23 ENCOUNTER — Encounter: Payer: Self-pay | Admitting: Cardiology

## 2024-01-23 ENCOUNTER — Telehealth: Payer: Self-pay

## 2024-01-23 ENCOUNTER — Ambulatory Visit: Attending: Cardiology | Admitting: Cardiology

## 2024-01-23 VITALS — BP 128/74 | Ht 72.0 in | Wt 195.8 lb

## 2024-01-23 DIAGNOSIS — I480 Paroxysmal atrial fibrillation: Secondary | ICD-10-CM | POA: Diagnosis not present

## 2024-01-23 DIAGNOSIS — I495 Sick sinus syndrome: Secondary | ICD-10-CM | POA: Diagnosis not present

## 2024-01-23 DIAGNOSIS — D6869 Other thrombophilia: Secondary | ICD-10-CM | POA: Diagnosis not present

## 2024-01-23 NOTE — Telephone Encounter (Signed)
**Note De-Identified Andraya Frigon Obfuscation** While at his f/u with Dr Kennyth this morning the pt returned the [facility] One-HST device that we gave to him on 01/08/2023 that was ordered by Dr Kennyth. He did return the device in its unopened original box.

## 2024-01-23 NOTE — Patient Instructions (Signed)

## 2024-02-03 ENCOUNTER — Emergency Department (HOSPITAL_BASED_OUTPATIENT_CLINIC_OR_DEPARTMENT_OTHER)
Admission: EM | Admit: 2024-02-03 | Discharge: 2024-02-03 | Disposition: A | Discharge status: Home / Self Care | Attending: Emergency Medicine | Admitting: Emergency Medicine

## 2024-02-03 ENCOUNTER — Encounter (HOSPITAL_BASED_OUTPATIENT_CLINIC_OR_DEPARTMENT_OTHER): Payer: Self-pay | Admitting: Emergency Medicine

## 2024-02-03 ENCOUNTER — Emergency Department (HOSPITAL_BASED_OUTPATIENT_CLINIC_OR_DEPARTMENT_OTHER): Admitting: Radiology

## 2024-02-03 ENCOUNTER — Other Ambulatory Visit: Payer: Self-pay

## 2024-02-03 DIAGNOSIS — J101 Influenza due to other identified influenza virus with other respiratory manifestations: Secondary | ICD-10-CM | POA: Insufficient documentation

## 2024-02-03 DIAGNOSIS — Z7901 Long term (current) use of anticoagulants: Secondary | ICD-10-CM | POA: Insufficient documentation

## 2024-02-03 DIAGNOSIS — I10 Essential (primary) hypertension: Secondary | ICD-10-CM | POA: Insufficient documentation

## 2024-02-03 DIAGNOSIS — Z8521 Personal history of malignant neoplasm of larynx: Secondary | ICD-10-CM | POA: Insufficient documentation

## 2024-02-03 DIAGNOSIS — Z79899 Other long term (current) drug therapy: Secondary | ICD-10-CM | POA: Insufficient documentation

## 2024-02-03 DIAGNOSIS — I480 Paroxysmal atrial fibrillation: Secondary | ICD-10-CM | POA: Insufficient documentation

## 2024-02-03 LAB — RESP PANEL BY RT-PCR (RSV, FLU A&B, COVID)  RVPGX2
Influenza A by PCR: POSITIVE — AB
Influenza B by PCR: NEGATIVE
Resp Syncytial Virus by PCR: NEGATIVE
SARS Coronavirus 2 by RT PCR: NEGATIVE

## 2024-02-03 MED ORDER — BENZONATATE 100 MG PO CAPS: 100.0000 mg | ORAL_CAPSULE | Freq: Three times a day (TID) | ORAL | 0 refills | Status: AC

## 2024-02-03 NOTE — ED Provider Notes (Signed)
 [place] EMERGENCY DEPARTMENT AT [facility] Provider Note   CSN: [id number] Arrival date & time: 02/03/24  1713     Patient presents with: Cough   Randy Hanson is a 83 y.o. male with pmhx of throat cancer, hypertension, hyperlipidemia, atrial fibrillation, who presents emergency department for evaluation of cough.  Patient reports that he has had a productive cough, congestion and headache for the last 5 days.  Patient states he has been taking some Motrin as needed for the headache.  Patient's wife, who is at bedside states she has also been giving the patient a decongestion to help loosen the cough.  Patient reports tightness in his chest.  He denies chest pain, shortness of breath, abdominal pain, nausea or vomiting.  Patient denies any fevers at home.  However, he does endorse body aches.   Cough      Prior to Admission medications   Medication Sig Start Date End Date Taking? Authorizing Provider  benzonatate  (TESSALON ) 100 MG capsule Take 1 capsule (100 mg total) by mouth every 8 (eight) hours. 02/03/24  Yes Randy Hanson, Randy RAMAN, PA-C  amLODipine (NORVASC) 5 MG tablet Take 5 mg by mouth daily. 02/25/22   [provider]  apixaban  (ELIQUIS ) 5 MG TABS tablet Take 1 tablet (5 mg total) by mouth 2 (two) times daily. 06/10/19   Randy Maude BROCKS, MD  diphenhydrAMINE  (BENADRYL  ALLERGY) 25 MG tablet Take 25 mg by mouth as needed for sleep.    [provider]  finasteride (PROSCAR) 5 MG tablet Take 5 mg by mouth daily. 01/28/22   [provider]  folic acid  (FOLVITE ) 1 MG tablet Take 1 mg by mouth daily.     [provider]  methotrexate (RHEUMATREX) 2.5 MG tablet Take 10 mg by mouth once a week. 08/20/22   [provider]  MYRBETRIQ 50 MG TB24 tablet Take 50 mg by mouth daily. 10/23/22   [provider]  omeprazole (PRILOSEC) 20 MG capsule Take 20 mg by mouth daily.    [provider]  polyethylene glycol powder  (MIRALAX ) 17 GM/SCOOP powder Take 17 g by mouth daily. 03/25/19   [provider]  predniSONE  (DELTASONE ) 10 MG tablet Take 10 mg by mouth daily. 02/08/20   [provider]  terazosin  (HYTRIN ) 2 MG capsule Take 2 mg by mouth 2 (two) times daily.     [provider]    Allergies: Lisinopril, Acyclovir and related, Famciclovir, and Simvastatin    Review of Systems  Respiratory:  Positive for cough.     Updated Vital Signs BP (!) 152/83   Pulse 77   Temp 98.3 F (36.8 C)   Resp 16   SpO2 98%   Physical Exam Vitals and nursing note reviewed.  Constitutional:      Appearance: Normal appearance.  HENT:     Head: Normocephalic and atraumatic.     Mouth/Throat:     Mouth: Mucous membranes are moist.  Eyes:     General: No scleral icterus.       Right eye: No discharge.        Left eye: No discharge.     Conjunctiva/sclera: Conjunctivae normal.  Cardiovascular:     Rate and Rhythm: Normal rate and regular rhythm.     Pulses: Normal pulses.  Pulmonary:     Effort: Pulmonary effort is normal.     Breath sounds: Normal breath sounds.  Abdominal:     General: There is no distension.  Tenderness: There is no abdominal tenderness.  Musculoskeletal:        General: No deformity.     Cervical back: Normal range of motion.  Skin:    General: Skin is warm and dry.     Capillary Refill: Capillary refill takes less than 2 seconds.  Neurological:     Mental Status: He is alert.     Motor: No weakness.  Psychiatric:        Mood and Affect: Mood normal.     (all labs ordered are listed, but only abnormal results are displayed) Labs Reviewed  RESP PANEL BY RT-PCR (RSV, FLU A&B, COVID)  [id number] - Abnormal; Notable for the following components:      Result Value   Influenza A by PCR POSITIVE (*)    All other components within normal limits    EKG: None  Radiology: DG Chest 2 View Result Date: 02/03/2024 EXAM: 2 VIEW(S) XRAY OF THE CHEST 02/03/2024  05:32:00 PM COMPARISON: Comparison with 03/22/2019. CLINICAL HISTORY: Cough. FINDINGS: LUNGS AND PLEURA: Emphysematous changes in the lungs. Mild bronchial wall thickening with central interstitial changes likely representing chronic bronchitis. No airspace disease or consolidation. No pleural effusion. No pneumothorax. HEART AND MEDIASTINUM: Mediastinal contours appear intact. Calcification of the aorta. No acute abnormality of the cardiac silhouette. BONES AND SOFT TISSUES: Degenerative changes in the spine. No acute osseous abnormality. IMPRESSION: 1. Emphysematous changes in the lungs. 2. Mild bronchial wall thickening with central interstitial changes, likely representing chronic bronchitis. No airspace disease or consolidation. Electronically signed by: Randy Gravely MD 02/03/2024 05:49 PM EST RP Workstation: [id number]    Procedures   Medications Ordered in the ED - No data to display                                 Medical Decision Making Amount and/or Complexity of Data Reviewed Radiology: ordered.  Risk Prescription drug management.   This patient presents to the ED for concern of cough, this involves an extensive number of treatment options, and is a complaint that carries with it a high risk of complications and morbidity.   Differential diagnosis includes: COPD exacerbation, URI, flu, COVID, RSV, pneumonia  Co morbidities:  history of throat cancer, hypertension, hyperlipidemia, paroxysmal A-fib  Social Determinants of Health:   none   Lab Tests:  I Ordered, and personally interpreted labs.  The pertinent results include:    - Influenza test positive  Imaging Studies:  I ordered imaging studies including chest x-ray I independently visualized and interpreted imaging which showed   . Emphysematous changes in the lungs.  2. Mild bronchial wall thickening with central interstitial changes, likely  representing chronic bronchitis. No airspace disease or  consolidation.   I agree with the radiologist interpretation  Cardiac Monitoring/ECG:  The patient was maintained on a cardiac monitor.  I personally viewed and interpreted the cardiac monitored which showed an underlying rhythm of: Sinus rhythm  Medicines ordered and prescription drug management: Not indicated at this time  Test Considered:   none  Critical Interventions:   none  Consultations Obtained: None  Problem List / ED Course:     ICD-10-CM   1. Influenza A  J10.1       MDM: 83 year old male who presents emergency department for evaluation of cough.  Patient has had a productive cough for the last 5 days.  He has also been reporting congestion and headache.  Patient  has been taking Tylenol  for the headache and cough suppressant for his cough.  Patient does not have a history of COPD or asthma.  He is stating he feels tightness in his chest, so chest x-ray has been ordered.  Chest x-ray shows emphysematous changes in the lungs and mild bronchial wall thickening representing chronic bronchitis.  There is no evidence of pneumonia.  Patient's respiratory panel was positive for the flu.  I have educated the patient on the fact that the best treatment for the flu is supportive care to treat his symptoms as well as adequate hydration.  Patient was requesting medicine for his cough, so I ordered Tessalon  Perles to his pharmacy.  I have informed the patient that he may continue taking Tylenol  and a cough/decongestant with this medicine.  He verbalizes understanding to this.  Patient's vital signs are stable and he is afebrile.  Patient is appropriate for discharge at this time.   Dispostion:  After consideration of the diagnostic results and the patients response to treatment, I feel that the patient would benefit from supportive care.  Final diagnoses:  Influenza A    ED Discharge Orders          Ordered    benzonatate  (TESSALON ) 100 MG capsule  Every 8 hours         02/03/24 1945               Randy Hanson, Randy RAMAN, PA-C 02/03/24 1952    Randy Ozell LABOR, DO 02/11/24 [phone]

## 2024-02-03 NOTE — ED Triage Notes (Signed)
 C/o cough, congestion, and sinus pressure x 5 days. Denies fevers.

## 2024-02-03 NOTE — ED Notes (Signed)
 RN reviewed discharge instructions with pt. Pt verbalized understanding and had no further questions

## 2024-02-03 NOTE — Discharge Instructions (Addendum)
 It was a pleasure taking care of you today. You were seen in the Emergency Department for evaluation of cough. Your work-up was reassuring. Your chest x-ray was unremarkable and showed no evidence of infection.  Your respiratory panel was positive for the flu.  As discussed, I have sent a medication called Tessalon  Perles to your pharmacy.  You may take these as needed to suppress your cough. Refer to the attached documentation for further management of your symptoms. Follow up with your PCP if your symptoms continue.  Please return to the ER if you experience chest pain, trouble breathing, intractable nausea/vomiting or any other life threatening illnesses.

## 2024-02-09 ENCOUNTER — Other Ambulatory Visit (HOSPITAL_COMMUNITY)

## 2024-03-01 ENCOUNTER — Other Ambulatory Visit (HOSPITAL_COMMUNITY)

## 2024-03-18 ENCOUNTER — Ambulatory Visit (HOSPITAL_COMMUNITY)
Admission: RE | Admit: 2024-03-18 | Discharge: 2024-03-18 | Disposition: A | Discharge status: Home / Self Care | Source: Ambulatory Visit | Attending: Gastroenterology | Admitting: Gastroenterology

## 2024-03-18 DIAGNOSIS — R1319 Other dysphagia: Secondary | ICD-10-CM | POA: Diagnosis present

## 2024-03-19 ENCOUNTER — Other Ambulatory Visit: Payer: Self-pay

## 2024-03-19 ENCOUNTER — Telehealth: Payer: Self-pay | Admitting: Gastroenterology

## 2024-03-19 DIAGNOSIS — K224 Dyskinesia of esophagus: Secondary | ICD-10-CM

## 2024-03-19 NOTE — Telephone Encounter (Signed)
 04/27/24 730 am [facility] EGD set up with Dr Legrand

## 2024-03-19 NOTE — Telephone Encounter (Signed)
 Dr. Delford,  This patient was seen in GI clinic 2 months ago for dysphagia in the setting of previous XRT for head and neck cancer.  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  He saw your EP partner in November for tachy/bradycardia syndrome, and they have decided against a pacemaker.  Are you agreeable to us  holding his Eliquis  36 to 48 hours before an upper endoscopy if the patient understands the risk/benefits?  Thanks  - Victory Brand, [facility]

## 2024-03-19 NOTE — Telephone Encounter (Signed)
 Left message on machine to call back

## 2024-03-19 NOTE — Telephone Encounter (Signed)
 This patient was seen in GI clinic 2 months ago for dysphagia in the setting of prior treatment for head and neck cancer.  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  It is essentially an incomplete relaxation of that muscle and it is the cause of his difficulty swallowing.  Sometimes an esophageal bar could be improved by doing endoscopic dilation.  If that does not work, then I would need to send him to ENT for consideration of other treatment such as Botox or surgery on that muscle.  If he is willing to have an upper endoscopy with me to dilate the area, then it would need to be done in the hospital outpatient endoscopy lab.  With his cardiac history I feel would be best to do that at [facility].  That means it would have to be during one of my hospital consult weeks, the next of which is the week of February 16th.  Please contact the endoscopy scheduling and find out if there could be availability for 730 case on Tuesday, February 17.  (Monday the 16th would not be optimal since I would be picking up the busy inpatient consult service and there will likely be multiple inpatient procedures to do after the weekend).  I messaged this patient's cardiologist (Dr. Maude Emmer), and he agreed to a 48-hour hold of Eliquis  prior to the endoscopy.  Let me know if any questions or concerns.  VEAR Brand MD

## 2024-03-22 NOTE — Telephone Encounter (Signed)
EGD scheduled, pt instructed and medications reviewed.  Patient instructions mailed to home.  Patient to call with any questions or concerns.  

## 2024-04-15 NOTE — Progress Notes (Unsigned)
" °  Cardiology Office Note:  .   Date:  04/15/2024  ID:  Randy Hanson, DOB [birth year], MRN [id number] PCP: Charlott Dorn LABOR, MD  [facility] Providers Cardiologist:  Maude Emmer, MD Electrophysiologist:  Fonda Kitty, MD    Patient Profile: .      PMH PAF Former tobacco abuse Hypertension Bradycardia Throat cancer Completed XRT and chemo 10/2015   First seen by cardiology 03/22/2019 during admission. History of remote cardiac cath, no reported history of CAD. Had PAF during admission for cholecystitis with spontaneous conversion. Was not anticoagulated at the time due to hematuria but was treated with beta blocker. TTE at that time revealed EF 65 to 70%, MAC AV sclerosis, normal atrium size. He later had cardiac monitor that revealed recurrent PAF with SSS AV block and pauses.  He was started on Eliquis  06/2019.  Seen by EP cardiology for evaluation of tachy bradycardia syndrome and although he met criteria for PPM, he was asymptomatic. He was off beta-blocker at that time. He had low risk stress test 06/17/2019.   Last cardiology clinic visit was 09/20/2022 with me at which time updated cardiac monitor was ordered. Zio completed 10/29/22 revealed predominant underlying rhythm sinus rhythm with minimal HR 21 bpm, average HR 65 bpm.  First-degree AV block, AF with RVR, 119 pauses the longest lasting 6 seconds second-degree AV block Mobitz type I.  Seen by EP Dr Kitty on multiple occasions and PPM thought not indicated now. ETT 01/30/23 and able to achieve max HR of 134 bpm   Seen in ED 02/03/24 with Influenza A CXR with emphysema and no infiltrate Only Rx with Tessalon  Perles and Tylenol     ***  ROS: See HPI       Studies Reviewed: .     ETT 01/30/23 Monitor 01/05/23      Physical Exam:   VS:  There were no vitals taken for this visit.   Wt Readings from Last 3 Encounters:  01/23/24 195 lb 12.8 oz (88.8 kg)  01/22/24 193 lb 6 oz (87.7 kg)  10/24/23 190 lb (86.2 kg)     Affect appropriate Healthy:  appears stated age HEENT: normal Neck supple with no adenopathy JVP normal no bruits no thyromegaly Lungs clear with no wheezing and good diaphragmatic motion Heart:  S1/S2 no murmur, no rub, gallop or click PMI normal Abdomen: benighn, BS positve, no tenderness, no AAA no bruit.  No HSM or HJR Distal pulses intact with no bruits No edema Neuro non-focal Skin warm and dry No muscular weakness     ASSESSMENT AND PLAN: .    PAF on chronic anticoagulation: HR is well controlled today. Monitor results with evidence of a fib with RVR reviewed. He is not AV nodal blocking agents 2/2 bradycardia. Nuisance bleeding on his arms and legs because he is very active but no significant bleeding. We will continue Eliquis  5 mg twice daily for stroke prevention for CHA2DS2-VASc score of 3.   Tachy-brady syndrome: Cardiac monitor completed 10/29/2022 revealed 2nd degree AV block Mobitz type 1, bradycardia, and 6-second pauses. Mostly at night ETT with max HR 134 no chronotropic incompetence  F/U Dr Kitty no PPM needed at this time per his review   Dyslipidemia:  Intolerant to multiple statins update lipid/liver   Hypertension: BP is well controlled. No medication changes today.        Dispo: F/U EP Dr Kitty 6 months and me in a year   Signed, Rosaline Bane, NP-C "

## 2024-04-27 ENCOUNTER — Encounter (HOSPITAL_COMMUNITY): Payer: Self-pay

## 2024-04-27 ENCOUNTER — Ambulatory Visit (HOSPITAL_COMMUNITY): Admit: 2024-04-27 | Admitting: Gastroenterology

## 2024-04-27 SURGERY — EGD (ESOPHAGOGASTRODUODENOSCOPY)
Anesthesia: Monitor Anesthesia Care

## 2024-04-28 ENCOUNTER — Ambulatory Visit: Admitting: Cardiovascular Disease
